# Patient Record
Sex: Female | Born: 1935 | Race: White | Hispanic: No | Smoking: Former smoker
Health system: Southern US, Community
[De-identification: ages and names within clinical notes are randomized; demographics above are authoritative.]

## PROBLEM LIST (undated history)

## (undated) DIAGNOSIS — F419 Anxiety disorder, unspecified: Secondary | ICD-10-CM

## (undated) DIAGNOSIS — I10 Essential (primary) hypertension: Secondary | ICD-10-CM

## (undated) DIAGNOSIS — K224 Dyskinesia of esophagus: Secondary | ICD-10-CM

## (undated) DIAGNOSIS — E785 Hyperlipidemia, unspecified: Secondary | ICD-10-CM

## (undated) DIAGNOSIS — J45909 Unspecified asthma, uncomplicated: Secondary | ICD-10-CM

## (undated) DIAGNOSIS — J449 Chronic obstructive pulmonary disease, unspecified: Secondary | ICD-10-CM

## (undated) DIAGNOSIS — N390 Urinary tract infection, site not specified: Secondary | ICD-10-CM

## (undated) DIAGNOSIS — T7840XA Allergy, unspecified, initial encounter: Secondary | ICD-10-CM

## (undated) DIAGNOSIS — M8580 Other specified disorders of bone density and structure, unspecified site: Secondary | ICD-10-CM

## (undated) DIAGNOSIS — M797 Fibromyalgia: Secondary | ICD-10-CM

## (undated) DIAGNOSIS — G4700 Insomnia, unspecified: Secondary | ICD-10-CM

## (undated) DIAGNOSIS — H269 Unspecified cataract: Secondary | ICD-10-CM

## (undated) DIAGNOSIS — M1990 Unspecified osteoarthritis, unspecified site: Secondary | ICD-10-CM

## (undated) DIAGNOSIS — K5792 Diverticulitis of intestine, part unspecified, without perforation or abscess without bleeding: Secondary | ICD-10-CM

## (undated) DIAGNOSIS — K449 Diaphragmatic hernia without obstruction or gangrene: Secondary | ICD-10-CM

## (undated) DIAGNOSIS — J439 Emphysema, unspecified: Secondary | ICD-10-CM

## (undated) HISTORY — PX: BLADDER SURGERY: SHX569

## (undated) HISTORY — PX: ABDOMINAL HYSTERECTOMY: SHX81

## (undated) HISTORY — DX: Other specified disorders of bone density and structure, unspecified site: M85.80

## (undated) HISTORY — DX: Diaphragmatic hernia without obstruction or gangrene: K44.9

## (undated) HISTORY — DX: Diverticulitis of intestine, part unspecified, without perforation or abscess without bleeding: K57.92

## (undated) HISTORY — DX: Dyskinesia of esophagus: K22.4

## (undated) HISTORY — PX: COLONOSCOPY: SHX174

## (undated) HISTORY — DX: Unspecified cataract: H26.9

## (undated) HISTORY — DX: Emphysema, unspecified: J43.9

## (undated) HISTORY — DX: Fibromyalgia: M79.7

## (undated) HISTORY — DX: Hyperlipidemia, unspecified: E78.5

## (undated) HISTORY — DX: Essential (primary) hypertension: I10

## (undated) HISTORY — DX: Anxiety disorder, unspecified: F41.9

## (undated) HISTORY — DX: Insomnia, unspecified: G47.00

## (undated) HISTORY — DX: Unspecified osteoarthritis, unspecified site: M19.90

## (undated) HISTORY — DX: Allergy, unspecified, initial encounter: T78.40XA

## (undated) HISTORY — DX: Urinary tract infection, site not specified: N39.0

---

## 1999-05-07 ENCOUNTER — Ambulatory Visit (HOSPITAL_BASED_OUTPATIENT_CLINIC_OR_DEPARTMENT_OTHER): Admission: RE | Admit: 1999-05-07 | Discharge: 1999-05-07 | Payer: Self-pay | Admitting: Plastic Surgery

## 1999-12-17 ENCOUNTER — Encounter: Payer: Self-pay | Admitting: Obstetrics and Gynecology

## 1999-12-17 ENCOUNTER — Encounter: Admission: RE | Admit: 1999-12-17 | Discharge: 1999-12-17 | Payer: Self-pay | Admitting: Obstetrics and Gynecology

## 2000-01-24 ENCOUNTER — Encounter: Payer: Self-pay | Admitting: Emergency Medicine

## 2000-01-24 ENCOUNTER — Emergency Department (HOSPITAL_COMMUNITY): Admission: EM | Admit: 2000-01-24 | Discharge: 2000-01-24 | Payer: Self-pay | Admitting: Emergency Medicine

## 2000-02-19 ENCOUNTER — Other Ambulatory Visit: Admission: RE | Admit: 2000-02-19 | Discharge: 2000-02-19 | Payer: Self-pay | Admitting: Obstetrics and Gynecology

## 2001-02-02 ENCOUNTER — Encounter: Payer: Self-pay | Admitting: Urology

## 2001-02-08 ENCOUNTER — Inpatient Hospital Stay (HOSPITAL_COMMUNITY): Admission: RE | Admit: 2001-02-08 | Discharge: 2001-02-10 | Payer: Self-pay | Admitting: Urology

## 2001-02-17 ENCOUNTER — Emergency Department (HOSPITAL_COMMUNITY): Admission: EM | Admit: 2001-02-17 | Discharge: 2001-02-17 | Payer: Self-pay | Admitting: *Deleted

## 2001-03-16 ENCOUNTER — Encounter: Payer: Self-pay | Admitting: Obstetrics and Gynecology

## 2001-03-16 ENCOUNTER — Encounter: Admission: RE | Admit: 2001-03-16 | Discharge: 2001-03-16 | Payer: Self-pay | Admitting: Obstetrics and Gynecology

## 2004-02-28 ENCOUNTER — Encounter: Admission: RE | Admit: 2004-02-28 | Discharge: 2004-02-28 | Payer: Self-pay | Admitting: Obstetrics and Gynecology

## 2004-06-11 ENCOUNTER — Encounter: Admission: RE | Admit: 2004-06-11 | Discharge: 2004-06-11 | Payer: Self-pay | Admitting: Gastroenterology

## 2005-11-04 ENCOUNTER — Encounter: Admission: RE | Admit: 2005-11-04 | Discharge: 2005-11-04 | Payer: Self-pay | Admitting: Obstetrics and Gynecology

## 2005-12-18 ENCOUNTER — Ambulatory Visit (HOSPITAL_COMMUNITY): Admission: RE | Admit: 2005-12-18 | Discharge: 2005-12-19 | Payer: Self-pay | Admitting: Ophthalmology

## 2005-12-18 ENCOUNTER — Encounter (INDEPENDENT_AMBULATORY_CARE_PROVIDER_SITE_OTHER): Payer: Self-pay | Admitting: *Deleted

## 2006-05-08 ENCOUNTER — Encounter: Payer: Self-pay | Admitting: Family Medicine

## 2006-11-10 ENCOUNTER — Encounter: Admission: RE | Admit: 2006-11-10 | Discharge: 2006-11-10 | Payer: Self-pay | Admitting: Obstetrics and Gynecology

## 2007-11-11 ENCOUNTER — Encounter: Admission: RE | Admit: 2007-11-11 | Discharge: 2007-11-11 | Payer: Self-pay | Admitting: Obstetrics and Gynecology

## 2008-12-06 ENCOUNTER — Ambulatory Visit: Payer: Self-pay | Admitting: Family Medicine

## 2008-12-06 DIAGNOSIS — I10 Essential (primary) hypertension: Secondary | ICD-10-CM | POA: Insufficient documentation

## 2008-12-06 DIAGNOSIS — E7800 Pure hypercholesterolemia, unspecified: Secondary | ICD-10-CM | POA: Insufficient documentation

## 2008-12-06 DIAGNOSIS — M899 Disorder of bone, unspecified: Secondary | ICD-10-CM | POA: Insufficient documentation

## 2008-12-06 DIAGNOSIS — M949 Disorder of cartilage, unspecified: Secondary | ICD-10-CM

## 2008-12-06 DIAGNOSIS — K5732 Diverticulitis of large intestine without perforation or abscess without bleeding: Secondary | ICD-10-CM | POA: Insufficient documentation

## 2008-12-06 DIAGNOSIS — H3530 Unspecified macular degeneration: Secondary | ICD-10-CM | POA: Insufficient documentation

## 2008-12-06 DIAGNOSIS — IMO0001 Reserved for inherently not codable concepts without codable children: Secondary | ICD-10-CM | POA: Insufficient documentation

## 2008-12-11 ENCOUNTER — Encounter: Payer: Self-pay | Admitting: Family Medicine

## 2008-12-12 LAB — CONVERTED CEMR LAB
ALT: 14 units/L (ref 0–35)
AST: 18 units/L (ref 0–37)
Albumin: 4.3 g/dL (ref 3.5–5.2)
Alkaline Phosphatase: 70 units/L (ref 39–117)
CO2: 24 meq/L (ref 19–32)
Chloride: 100 meq/L (ref 96–112)
Creatinine, Ser: 0.78 mg/dL (ref 0.40–1.20)
Glucose, Bld: 102 mg/dL — ABNORMAL HIGH (ref 70–99)
LDL Cholesterol: 146 mg/dL — ABNORMAL HIGH (ref 0–99)
Potassium: 4.9 meq/L (ref 3.5–5.3)
Sodium: 137 meq/L (ref 135–145)
Total CHOL/HDL Ratio: 4.3

## 2009-01-30 ENCOUNTER — Encounter: Payer: Self-pay | Admitting: Family Medicine

## 2009-01-31 LAB — CONVERTED CEMR LAB
Cholesterol: 225 mg/dL — ABNORMAL HIGH (ref 0–200)
HDL: 48 mg/dL (ref >39)
Total CHOL/HDL Ratio: 4.7

## 2009-02-05 ENCOUNTER — Telehealth: Payer: Self-pay | Admitting: Family Medicine

## 2009-02-07 ENCOUNTER — Ambulatory Visit: Payer: Self-pay | Admitting: Family Medicine

## 2009-02-08 LAB — CONVERTED CEMR LAB
Chloride: 100 meq/L (ref 96–112)
Estradiol: 41.3 pg/mL
FSH: 65.1 milliintl units/mL
HCT: 43.8 % (ref 36.0–46.0)
Hemoglobin: 14 g/dL (ref 12.0–15.0)
LH: 20.9 milliintl units/mL
MCHC: 32 g/dL (ref 30.0–36.0)
Platelets: 268 10*3/uL (ref 150–400)
Progesterone: <0.2 ng/mL
RBC: 4.66 M/uL (ref 3.87–5.11)
RDW: 13.6 % (ref 11.5–15.5)

## 2009-03-05 ENCOUNTER — Encounter: Admission: RE | Admit: 2009-03-05 | Discharge: 2009-03-05 | Payer: Self-pay | Admitting: Family Medicine

## 2009-03-14 DIAGNOSIS — K449 Diaphragmatic hernia without obstruction or gangrene: Secondary | ICD-10-CM | POA: Insufficient documentation

## 2009-05-04 ENCOUNTER — Ambulatory Visit: Payer: Self-pay | Admitting: Family Medicine

## 2009-05-11 ENCOUNTER — Ambulatory Visit: Payer: Self-pay | Admitting: Family Medicine

## 2009-05-11 DIAGNOSIS — R3915 Urgency of urination: Secondary | ICD-10-CM | POA: Insufficient documentation

## 2009-05-11 DIAGNOSIS — R011 Cardiac murmur, unspecified: Secondary | ICD-10-CM | POA: Insufficient documentation

## 2009-05-11 LAB — CONVERTED CEMR LAB
Bilirubin Urine: NEGATIVE
Blood in Urine, dipstick: NEGATIVE
Glucose, Urine, Semiquant: NEGATIVE
Ketones, urine, test strip: NEGATIVE
Specific Gravity, Urine: 1.015
Urobilinogen, UA: 0.2
WBC Urine, dipstick: NEGATIVE

## 2009-05-12 ENCOUNTER — Encounter: Payer: Self-pay | Admitting: Family Medicine

## 2009-05-17 ENCOUNTER — Encounter: Payer: Self-pay | Admitting: Family Medicine

## 2009-05-17 LAB — CONVERTED CEMR LAB
LDL Cholesterol: 110 mg/dL — ABNORMAL HIGH (ref 0–99)
Total CHOL/HDL Ratio: 3.6
Triglycerides: 86 mg/dL (ref <150)
VLDL: 17 mg/dL (ref 0–40)

## 2009-05-22 ENCOUNTER — Ambulatory Visit: Payer: Self-pay | Admitting: Cardiovascular Disease

## 2009-05-22 ENCOUNTER — Encounter: Payer: Self-pay | Admitting: Family Medicine

## 2009-05-22 ENCOUNTER — Ambulatory Visit: Payer: Self-pay

## 2009-05-22 ENCOUNTER — Ambulatory Visit (HOSPITAL_COMMUNITY): Admission: RE | Admit: 2009-05-22 | Discharge: 2009-05-22 | Payer: Self-pay | Admitting: Family Medicine

## 2009-06-12 ENCOUNTER — Telehealth: Payer: Self-pay | Admitting: Family Medicine

## 2009-06-22 ENCOUNTER — Ambulatory Visit: Payer: Self-pay | Admitting: Family Medicine

## 2009-10-16 LAB — HM DEXA SCAN

## 2009-11-12 ENCOUNTER — Ambulatory Visit: Payer: Self-pay | Admitting: Family

## 2009-11-13 ENCOUNTER — Telehealth: Payer: Self-pay | Admitting: Family

## 2010-03-28 ENCOUNTER — Ambulatory Visit: Payer: Self-pay | Admitting: Family Medicine

## 2010-03-28 LAB — CONVERTED CEMR LAB: Rapid Strep: NEGATIVE

## 2010-05-02 ENCOUNTER — Telehealth: Payer: Self-pay | Admitting: Family Medicine

## 2010-05-03 ENCOUNTER — Ambulatory Visit
Admission: RE | Admit: 2010-05-03 | Discharge: 2010-05-03 | Payer: Self-pay | Source: Home / Self Care | Attending: Family Medicine | Admitting: Family Medicine

## 2010-05-03 ENCOUNTER — Encounter
Admission: RE | Admit: 2010-05-03 | Discharge: 2010-05-03 | Payer: Self-pay | Source: Home / Self Care | Attending: Family Medicine | Admitting: Family Medicine

## 2010-05-03 DIAGNOSIS — J209 Acute bronchitis, unspecified: Secondary | ICD-10-CM | POA: Insufficient documentation

## 2010-05-28 NOTE — Assessment & Plan Note (Signed)
Summary: FLU SHOT  Nurse Visit   Vitals Entered By: Payton Spark CMA (May 04, 2009 2:06 PM)  Allergies: No Known Drug Allergies  Orders Added: 1)  Flu Vaccine 15yrs + [90658] 2)  Administration Flu vaccine - MCR [G0008] Flu Vaccine Consent Questions     Do you have a history of severe allergic reactions to this vaccine? no    Any prior history of allergic reactions to egg and/or gelatin? no    Do you have a sensitivity to the preservative Thimersol? no    Do you have a past history of Guillan-Barre Syndrome? no    Do you currently have an acute febrile illness? no    Have you ever had a severe reaction to latex? no    Vaccine information given and explained to patient? yes    Are you currently pregnant? no    Lot Number:[id number]   Exp Date:10/25/2009   Site Given  Left Deltoid IMmedflu

## 2010-05-28 NOTE — Assessment & Plan Note (Signed)
Summary: folliculitis   Vital Signs:  Patient profile:   77 year old female Height:      63.5 inches Weight:      161 pounds BMI:     28.17 O2 Sat:      96 % on Room air Temp:     98.4 degrees F oral Pulse rate:   104 / minute BP sitting:   123 / 80  (left arm) Cuff size:   regular  Vitals Entered By: Payton Spark CMA (June 22, 2009 2:37 PM)  O2 Flow:  Room air CC: tired, sore in nose, HA and changes in nails.    Primary Care Provider:  Nani Gasser MD  CC:  tired, sore in nose, and HA and changes in nails. .  History of Present Illness: 77 yo WF presents for feeling tired for 2 wks.  She has multiple vague complaints.  She has been under a lot of stress.  Her husband has been going thru medical problems and has not been so nice to her.  She has also been going thru a lot of things in the attic which has stirred up some emotions.  She is sleeping well at night.  She has not been exercising.  She has recently restarted all of her meds.  Denies fevers, wt loss, cough.    She has been using neosporin and peroxide on a sore in her nose for the past few wks it has not cleared.  It started after she pulled a hair out.    Allergies: No Known Drug Allergies  Past History:  Past Medical History: Reviewed history from 05/11/2009 and no changes required. HTN, high cholesterol Bronchietasis Hx of situational anxiety Insomnia - In the past tried doxepin and didn't work.   Social History: Reviewed history from 02/07/2009 and no changes required. Married to [employer].  Her husband has many chronic illnesses. HS degree. Has a daughter and son. Son is disabled.  Former Smoker, quit 1954 Alcohol use-no Drug use-no Regular exercise-no  Review of Systems      See HPI  Physical Exam  General:  alert, well-developed, well-nourished, and well-hydrated.   Head:  normocephalic and atraumatic.   Eyes:  conjunctiva clear Ears:  no external deformities.   Nose:  no  nasal discharge.  injected hair follicle in the R side Mouth:  o/p pink and moist Neck:  no masses.   Lungs:  Normal respiratory effort, chest expands symmetrically. Lungs are clear to auscultation, no crackles or wheezes. Heart:  Normal rate and regular rhythm. S1 and S2 normal without gallop, murmur, click, rub or other extra sounds. Extremities:  no LE edema Skin:  color normal.   brittle fingernails and toenails Cervical Nodes:  No lymphadenopathy noted   Impression & Recommendations:  Problem # 1:  FOLLICULITIS (ICD-704.8) Treat with topical bactroban ointment. Clean with soap daily  Problem # 2:  OTHER SPECIFIED DISEASE OF NAIL (ICD-703.8) Brittle nails may be the result of stress, poor nutrition, etc. I advised changing her MVI to PNV daily to help w/ nails. She needs to f/u with Dr Linford Arnold reguarding high stress level and mood.  Complete Medication List: 1)  Clorazepate Dipotassium 3.75 Mg Tabs (Clorazepate dipotassium) .... Take one tablet by mouth at bedtime 2)  Imipramine Hcl 50 Mg Tabs (Imipramine hcl) .... Take 1 tablet by mouth once a day at bedtime 3)  Prilosec 20 Mg Cpdr (Omeprazole) .... Take one tablet by mouth once in the morning 4)  Calcium Carbonate 1250 Mg Tabs (Calcium carbonate) .... Take one tablet by mouth once a day 5)  Multivitamins Tabs (Multiple vitamin) .... Take one tablet by mouth once a day 6)  Pravastatin Sodium 20 Mg Tabs (Pravastatin sodium) .... Take 1 tablet by mouth once a day at bedtime 7)  Vitamin D 1000 Unit Tabs (Cholecalciferol) .... Take one tabalet by mouth 9 times a day 8)  Preservision/lutein Caps (Multiple vitamins-minerals) .... Take one tablet by mouth twice a day 9)  Vitamin B-6 500 Mg Tabs (Pyridoxine hcl) .... Take one tablet by mouth once a day 10)  Vitamin B-12 1000 Mcg Tabs (Cyanocobalamin) .... Take one tablet by mouth once a day 11)  Proventil Hfa 108 (90 Base) Mcg/act Aers (Albuterol sulfate) .... 2 - 4  puff inhaled every  4-6 hours as needed 12)  Bactroban 2 % Oint (Mupirocin) .... Apply to nasal sore three times a day x 7 days  Patient Instructions: 1)  Use Bactroban ointment for nasal sore. 2)  Change Multivitamin to Prenatal Vitamin daily for nail growth. 3)  F/u with Dr Linford Arnold in 1-2 wks if mood is not improving.   Prescriptions: BACTROBAN 2 % OINT (MUPIROCIN) apply to nasal sore three times a day x 7 days  #1 tube x 0   Entered and Authorized by:   Seymour Bars DO   Signed by:   Seymour Bars DO on 06/22/2009   Method used:   Electronically to        [facility] [address] (retail)       [address] [place], [place]  [address]       Ph: [phone] or [phone]       Fax: [phone]   RxID:   [id number]

## 2010-05-28 NOTE — Assessment & Plan Note (Signed)
Summary: URI   Vital Signs:  Patient profile:   77 year old female Height:      63.5 inches Weight:      163 pounds BMI:     28.52 O2 Sat:      96 % on Room air Temp:     98.7 degrees F oral Pulse rate:   105 / minute BP sitting:   114 / 68  (left arm) Cuff size:   regular  Vitals Entered By: Payton Spark CMA (March 28, 2010 3:07 PM)  O2 Flow:  Room air CC: Very ST x 2 days.   Primary Care Provider:  Nani Gasser MD  CC:  Very ST x 2 days.Marland Kitchen  History of Present Illness: ST and cough for 2 days. No ear pain, fever, or ear pain.  Some loose stools.  TAking mucinex and IBUPROFEN and robitussin. Coughing so much she couldn't sleep. She is coughing in the rooma dn it sounds wet. She was around her grandaughter last weekend who had a runny nose. + fatigue.  No SOB.    Current Medications (verified): 1)  Clorazepate Dipotassium 3.75 Mg Tabs (Clorazepate Dipotassium) .... Take One Tablet By Mouth At Bedtime 2)  Imipramine Hcl 50 Mg Tabs (Imipramine Hcl) .... Take 1 Tablet By Mouth Once A Day At Bedtime 3)  Prilosec 20 Mg Cpdr (Omeprazole) .... Take One Tablet By Mouth Once in The Morning 4)  Calcium Carbonate 1250 Mg Tabs (Calcium Carbonate) .... Take One Tablet By Mouth Once A Day 5)  Multivitamins  Tabs (Multiple Vitamin) .... Take One Tablet By Mouth Once A Day 6)  Vitamin D 1000 Unit Tabs (Cholecalciferol) .... Take One Tabalet By Mouth 9 Times A Day 7)  Preservision/lutein  Caps (Multiple Vitamins-Minerals) .... Take One Tablet By Mouth Twice A Day 8)  Vitamin B-6 500 Mg Tabs (Pyridoxine Hcl) .... Take One Tablet By Mouth Once A Day 9)  Vitamin B-12 1000 Mcg Tabs (Cyanocobalamin) .... Take One Tablet By Mouth Once A Day  Allergies (verified): No Known Drug Allergies  Physical Exam  General:  Well-developed,well-nourished,in no acute distress; alert,appropriate and cooperative throughout examination Head:  Normocephalic and atraumatic without obvious abnormalities.  No apparent alopecia or balding. Eyes:  No corneal or conjunctival inflammation noted. EOMI. Perrla. Ears:  External ear exam shows no significant lesions or deformities.  Otoscopic examination reveals clear canals, tympanic membranes are intact bilaterally without bulging, retraction, inflammation or discharge. Hearing is grossly normal bilaterally. Nose:  External nasal examination shows no deformity or inflammation. Nasal mucosa are pink and moist without lesions or exudates. Mouth:  OP with mild erythema. NO lesions.  Neck:  No deformities, masses, or tenderness noted. Lungs:  Normal respiratory effort, chest expands symmetrically. Lungs are clear to auscultation, no crackles or wheezes. Heart:  Normal rate and regular rhythm. S1 and S2 normal without gallop, murmur, click, rub or other extra sounds. Skin:  no rashes.   Cervical Nodes:  No lymphadenopathy noted Psych:  Cognition and judgment appear intact. Alert and cooperative with normal attention span and concentration. No apparent delusions, illusions, hallucinations   Impression & Recommendations:  Problem # 1:  URI (ICD-465.9)  Cough med for symptomatic tx. Call if getting worse or not better in 1-2 weeks.  Her updated medication list for this problem includes:    Hydrocodone-homatropine 5-1.5 Mg/26ml Syrp (Hydrocodone-homatropine) .Marland KitchenMarland KitchenMarland KitchenMarland Kitchen 5ml by mouth at bedtime  Instructed on symptomatic treatment. Call if symptoms persist or worsen.   Orders: Rapid Strep (  [address])  Complete Medication List: 1)  Clorazepate Dipotassium 3.75 Mg Tabs (Clorazepate dipotassium) .... Take one tablet by mouth at bedtime 2)  Imipramine Hcl 50 Mg Tabs (Imipramine hcl) .... Take 1 tablet by mouth once a day at bedtime 3)  Prilosec 20 Mg Cpdr (Omeprazole) .... Take one tablet by mouth once in the morning 4)  Calcium Carbonate 1250 Mg Tabs (Calcium carbonate) .... Take one tablet by mouth once a day 5)  Multivitamins Tabs (Multiple vitamin) .... Take one  tablet by mouth once a day 6)  Vitamin D 1000 Unit Tabs (Cholecalciferol) .... Take one tabalet by mouth 9 times a day 7)  Preservision/lutein Caps (Multiple vitamins-minerals) .... Take one tablet by mouth twice a day 8)  Vitamin B-6 500 Mg Tabs (Pyridoxine hcl) .... Take one tablet by mouth once a day 9)  Vitamin B-12 1000 Mcg Tabs (Cyanocobalamin) .... Take one tablet by mouth once a day 10)  Hydrocodone-homatropine 5-1.5 Mg/62ml Syrp (Hydrocodone-homatropine) .... 5ml by mouth at bedtime  Patient Instructions: 1)  Salt water gargles for your throat.  2)  Can try the cough medicine for night.   3)  Call if not Better in 1-2 weeks.  Prescriptions: HYDROCODONE-HOMATROPINE 5-1.5 MG/5ML SYRP (HYDROCODONE-HOMATROPINE) 5ml by mouth at bedtime  #145ml x 0   Entered and Authorized by:   Nani Gasser MD   Signed by:   Nani Gasser MD on 03/28/2010   Method used:   Printed then faxed to ...       [facility] [address] (retail)       [address]       [place], [place]  [address]       Ph: [phone]       Fax: [phone]   RxID:   [id number]    Orders Added: 1)  Rapid Strep [44010] 2)  Est. Patient Level III [[address]253]    Laboratory Results    Other Tests  Rapid Strep: negative

## 2010-05-28 NOTE — Progress Notes (Signed)
Summary: refill  Phone Note Refill Request Message from:  Patient on June 12, 2009 1:11 PM  Refills Requested: Medication #1:  CLORAZEPATE DIPOTASSIUM 3.75 MG TABS Take one tablet by mouth at bedtime   Supply Requested: 1 month fax to [facility] [address] in [place]   Method Requested: Fax to Local Pharmacy Initial call taken by: Kathlene November,  June 12, 2009 1:12 PM    Prescriptions: CLORAZEPATE DIPOTASSIUM 3.75 MG TABS (CLORAZEPATE DIPOTASSIUM) Take one tablet by mouth at bedtime  #30 x 2   Entered and Authorized by:   Seymour Bars DO   Signed by:   Seymour Bars DO on 06/12/2009   Method used:   Printed then faxed to ...       [facility] [address] (retail)       [address] [place], [place]  [address]       Ph: [phone] or [phone]       Fax: [phone]   RxID:   [id number]

## 2010-05-28 NOTE — Assessment & Plan Note (Signed)
Summary: Bug Bite that has a blister and red-jr--Rm 6   Vital Signs:  Patient profile:   77 year old female Height:      63.5 inches Weight:      162.25 pounds BMI:     28.39 Temp:     97.9 degrees F oral Pulse rate:   90 / minute Pulse rhythm:   regular Resp:     16 per minute BP sitting:   130 / 86  (right arm) Cuff size:   regular  Vitals Entered By: Beth [name] CMA Duncan Dull) (November 12, 2009 3:50 PM) CC: Room 6  Pt has red area on left upper arm that started out like a mosquito bite and developed 2 blisters since last Thursday. Is Patient Diabetic? No Comments Pt has completed Bactroban. Stopped Proventil as insurance no longer covers it.   Primary Care Provider:  Nani Gasser MD  CC:  Room 6  Pt has red area on left upper arm that started out like a mosquito bite and developed 2 blisters since last Thursday.Marland Kitchen  History of Present Illness: Beth [name] is a 77 year old female who presents today with complaint of bug bite since Thursday.  Initally blistered/itchy.   Was hot over the weekend. Became hard.  Denies fever.  Denies known tick exposure. She is currently taking cipro for a UTI.    Allergies (verified): No Known Drug Allergies  Physical Exam  General:  Well-developed,well-nourished,in no acute distress; alert,appropriate and cooperative throughout examination Skin:  6 cm diameter area or erythema, no significant swelling Psych:  Cognition and judgment appear intact. Alert and cooperative with normal attention span and concentration. No apparent delusions, illusions, hallucinations   Impression & Recommendations:  Problem # 1:  INSECT BITE, UPPER ARM (ICD-912.4) Assessment New Suspect that redness is due to allergic reaction.  Will plan follow up in a few weeks to evaluate and apply hydrocortisone for now.    Complete Medication List: 1)  Clorazepate Dipotassium 3.75 Mg Tabs (Clorazepate dipotassium) .... Take one tablet by mouth at bedtime 2)  Imipramine  Hcl 50 Mg Tabs (Imipramine hcl) .... Take 1 tablet by mouth once a day at bedtime 3)  Prilosec 20 Mg Cpdr (Omeprazole) .... Take one tablet by mouth once in the morning 4)  Calcium Carbonate 1250 Mg Tabs (Calcium carbonate) .... Take one tablet by mouth once a day 5)  Multivitamins Tabs (Multiple vitamin) .... Take one tablet by mouth once a day 6)  Pravastatin Sodium 20 Mg Tabs (Pravastatin sodium) .... Take 1 tablet by mouth once a day at bedtime 7)  Vitamin D 1000 Unit Tabs (Cholecalciferol) .... Take one tabalet by mouth 9 times a day 8)  Preservision/lutein Caps (Multiple vitamins-minerals) .... Take one tablet by mouth twice a day 9)  Vitamin B-6 500 Mg Tabs (Pyridoxine hcl) .... Take one tablet by mouth once a day 10)  Vitamin B-12 1000 Mcg Tabs (Cyanocobalamin) .... Take one tablet by mouth once a day 11)  Proventil Hfa 108 (90 Base) Mcg/act Aers (Albuterol sulfate) .... 2 - 4  puff inhaled every 4-6 hours as needed 12)  Bactroban 2 % Oint (Mupirocin) .... Apply to nasal sore three times a day x 7 days 13)  Ciprofloxacin Hcl 500 Mg Tabs (Ciprofloxacin hcl) .... Take 1 tablet by mouth two times a day  Patient Instructions: 1)  Call if you develop fever over 101, increasing pain, swelling, firmness, or redness of your left arm.     Current  Allergies (reviewed today): No known allergies

## 2010-05-28 NOTE — Assessment & Plan Note (Signed)
Summary: 3 MO FU HTN, chol, urinary freq, etc   Vital Signs:  Patient profile:   77 year old Beth [name] Height:      63.5 inches Weight:      162 pounds Pulse rate:   87 / minute BP sitting:   131 / 83  (left arm) Cuff size:   regular  Vitals Entered By: Kathlene November (May 11, 2009 10:48 AM) CC: checkup BP   Primary Care Provider:  Nani Gasser MD  CC:  checkup BP.  History of Present Illness: Some urinargy frequncy adn odor. Her urine looks "cloudy".  No pain or abdominal pain.  Hx of bladder sling.    Feels her hair has lost its volume. Also noticed some skin adn nails changed. Noticed nails were thin. Had normal TSH in October. Has gained 5 lbs since October.  Admist her diet has changed over the HOlidays.   Right artery "beats hard" at night.  No pain but would like me to look at this.  Not sure when started. No exacerbating or alleviating sxs.    Current Medications (verified): 1)  Clorazepate Dipotassium 3.75 Mg Tabs (Clorazepate Dipotassium) .... Take One Tablet By Mouth At Bedtime 2)  Imipramine Hcl 50 Mg Tabs (Imipramine Hcl) .... Take 1 Tablet By Mouth Once A Day At Bedtime 3)  Prilosec 20 Mg Cpdr (Omeprazole) .... Take One Tablet By Mouth Once in The Morning 4)  Calcium Carbonate 1250 Mg Tabs (Calcium Carbonate) .... Take One Tablet By Mouth Once A Day 5)  Multivitamins  Tabs (Multiple Vitamin) .... Take One Tablet By Mouth Once A Day 6)  Pravastatin Sodium 20 Mg Tabs (Pravastatin Sodium) .... Take 1 Tablet By Mouth Once A Day At Bedtime 7)  Vitamin D 1000 Unit Tabs (Cholecalciferol) .... Take One Tabalet By Mouth 9 Times A Day 8)  Preservision/lutein  Caps (Multiple Vitamins-Minerals) .... Take One Tablet By Mouth Twice A Day 9)  Vitamin B-6 500 Mg Tabs (Pyridoxine Hcl) .... Take One Tablet By Mouth Once A Day 10)  Vitamin B-12 1000 Mcg Tabs (Cyanocobalamin) .... Take One Tablet By Mouth Once A Day 11)  Proventil Hfa 108 (90 Base) Mcg/act Aers (Albuterol Sulfate)  .... 2 - 4  Puff Inhaled Every 4-6 Hours As Needed  Allergies (verified): No Known Drug Allergies  Comments:  Nurse/Medical Assistant: The patient's medications and allergies were reviewed with the patient and were updated in the Medication and Allergy Lists. Kathlene November (May 11, 2009 10:49 AM)  Past History:  Past Medical History: HTN, high cholesterol Bronchietasis Hx of situational anxiety Insomnia - In the past tried doxepin and didn't work.   Past Surgical History: Badder surgery, tac Hysterectomy   Physical Exam  General:  Well-developed,well-nourished,in no acute distress; alert,appropriate and cooperative throughout examination Head:  Normocephalic and atraumatic without obvious abnormalities. No apparent alopecia or balding. Neck:  No deformities, masses, or tenderness noted. No TM.  Lungs:  Normal respiratory effort, chest expands symmetrically. Lungs are clear to auscultation, no crackles or wheezes. Heart:  Normal rate and regular rhythm. S1 and S2 normal without gallop, murmur, , rub or other extra sounds.  Has a diastolic click that radiates towards the neck. Best heard at the right sternal border. No carotid or abdomianl bruits.  Abdomen:  non-tender, normal bowel sounds, and no distention.   Pulses:  Radial 2+ Skin:  no rashes.   Cervical Nodes:  No lymphadenopathy noted Psych:  Cognition and judgment appear intact. Alert and cooperative with  normal attention span and concentration. No apparent delusions, illusions, hallucinations   Impression & Recommendations:  Problem # 1:  HYPERTENSION, BENIGN (ICD-401.1) Looks geat today.  At goal thought a little higher than usual for her.    Problem # 2:  DRY SKIN (ICD-701.1) Will recheck TSH.  Though normal 3 mo ago.   Orders: T-TSH ([id number])  Problem # 3:  URINARY URGENCY (ICD-788.63) UA was clear so will send a culture.  Orders: UA Dipstick w/o Micro (automated)  (81003) T-Urine Culture (Spectrum  Order) ([id number])  Problem # 4:  MURMUR (ICD-785.2) Will refer for echo for further evaluation of the aortic valve.  No carotid murmur on exam.  Orders: T-2-D Echo Complete ([id number])  Problem # 5:  HYPERCHOLESTEROLEMIA (ICD-272.0) Due to recheck on teh statin. Will go ahed and refill today but the reason I only have her 2 mo worth was so we could recheck her values adn make sure this is the right med for her.  Her updated medication list for this problem includes:    Pravastatin Sodium 20 Mg Tabs (Pravastatin sodium) .Marland Kitchen... Take 1 tablet by mouth once a day at bedtime  Orders: T-Lipid Profile ([id number])  Complete Medication List: 1)  Clorazepate Dipotassium 3.75 Mg Tabs (Clorazepate dipotassium) .... Take one tablet by mouth at bedtime 2)  Imipramine Hcl 50 Mg Tabs (Imipramine hcl) .... Take 1 tablet by mouth once a day at bedtime 3)  Prilosec 20 Mg Cpdr (Omeprazole) .... Take one tablet by mouth once in the morning 4)  Calcium Carbonate 1250 Mg Tabs (Calcium carbonate) .... Take one tablet by mouth once a day 5)  Multivitamins Tabs (Multiple vitamin) .... Take one tablet by mouth once a day 6)  Pravastatin Sodium 20 Mg Tabs (Pravastatin sodium) .... Take 1 tablet by mouth once a day at bedtime 7)  Vitamin D 1000 Unit Tabs (Cholecalciferol) .... Take one tabalet by mouth 9 times a day 8)  Preservision/lutein Caps (Multiple vitamins-minerals) .... Take one tablet by mouth twice a day 9)  Vitamin B-6 500 Mg Tabs (Pyridoxine hcl) .... Take one tablet by mouth once a day 10)  Vitamin B-12 1000 Mcg Tabs (Cyanocobalamin) .... Take one tablet by mouth once a day 11)  Proventil Hfa 108 (90 Base) Mcg/act Aers (Albuterol sulfate) .... 2 - 4  puff inhaled every 4-6 hours as needed Prescriptions: PRAVASTATIN SODIUM 20 MG TABS (PRAVASTATIN SODIUM) Take 1 tablet by mouth once a day at bedtime  #30 x 6   Entered and Authorized by:   Nani Gasser MD   Signed by:   Nani Gasser MD on  05/11/2009   Method used:   Electronically to        [facility] [address] (retail)       [address] [place], [place]  [address]       Ph: [phone] or [phone]       Fax: [phone]   RxID:   [id number] IMIPRAMINE HCL 50 MG TABS (IMIPRAMINE HCL) Take 1 tablet by mouth once a day at bedtime  #30 x 6   Entered and Authorized by:   Nani Gasser MD   Signed by:   Nani Gasser MD on 05/11/2009   Method used:   Electronically to        [facility] [address] (retail)       [address]  [place], [place]  [address]       Ph: [phone] or [phone]       Fax: [phone]   RxID:   [id number]    Laboratory Results   Urine Tests  Date/Time Received: 05/11/2009 Date/Time Reported: 05/11/2009  Routine Urinalysis   Color: yellow Appearance: Clear Glucose: negative   (Normal Range: Negative) Bilirubin: negative   (Normal Range: Negative) Ketone: negative   (Normal Range: Negative) Spec. Gravity: 1.015   (Normal Range: 1.003-1.035) Blood: negative   (Normal Range: Negative) pH: 8.5   (Normal Range: 5.0-8.0) Protein: negative   (Normal Range: Negative) Urobilinogen: 0.2   (Normal Range: 0-1) Nitrite: negative   (Normal Range: Negative) Leukocyte Esterace: negative   (Normal Range: Negative)

## 2010-05-28 NOTE — Progress Notes (Signed)
  Phone Note Outgoing Call   Summary of Call: Please call patient and arrange a follow up visit in a few weeks so that we can re-evaluate her rash.  In the meantime, she can use hydrocortisone cream to see if this helps the redness.  Initial call taken by: Lemont Fillers FNP,  November 13, 2009 5:12 PM  Follow-up for Phone Call        Called pt left VM of instructions. KJ LPN Follow-up by: Kathlene November,  November 14, 2009 8:09 AM     Appended Document:  Pt called back and states rash is gone and is better- no need for return visit to re-evaluate. KJ LPN

## 2010-05-30 NOTE — Assessment & Plan Note (Signed)
Summary: acute bronchitis   Vital Signs:  Patient profile:   77 year old female Height:      63.5 inches Weight:      163 pounds O2 Sat:      88 % on Room air Temp:     98.4 degrees F oral Pulse rate:   86 / minute BP sitting:   147 / 90  (right arm) Cuff size:   regular  Vitals Entered By: Avon Gully CMA, (AAMA) (May 03, 2010 10:40 AM)  O2 Flow:  Room air CC: ongoing cough,chest hurts,sides hurt,HA,dizziness   Primary Care Provider:  Nani Gasser MD  CC:  ongoing cough, chest hurts, sides hurt, HA, and dizziness.  History of Present Illness: Seen 5 weeks ago for URI.  Says got some better but didn't get completely rid of the cough. It eventually because a loose cough and she thought this was good.  Then visited with her sister over Christmas how had a head cold and then suddenly cough was worse ad eyes were watering.  Has been taking cough meds.  Now having runny nose.  Did try zyrtec but hasn' really helped.  No fever.  Lots of post nasal drip. No facial pressure. No ear pressure.  No GI sxs. she denies any shortness of breath .  She has had some soreness at the bases of her lungs she thinks secondary to coughing.  She is predominantly been using NyQuil at that time for the cough.  Current Medications (verified): 1)  Clorazepate Dipotassium 3.75 Mg Tabs (Clorazepate Dipotassium) .... Take One Tablet By Mouth At Bedtime 2)  Imipramine Hcl 50 Mg Tabs (Imipramine Hcl) .... Take 1 Tablet By Mouth Once A Day At Bedtime 3)  Prilosec 20 Mg Cpdr (Omeprazole) .... Take One Tablet By Mouth Once in The Morning 4)  Calcium Carbonate 1250 Mg Tabs (Calcium Carbonate) .... Take One Tablet By Mouth Once A Day 5)  Multivitamins  Tabs (Multiple Vitamin) .... Take One Tablet By Mouth Once A Day 6)  Vitamin D 1000 Unit Tabs (Cholecalciferol) .... Take One Tabalet By Mouth 9 Times A Day 7)  Preservision/lutein  Caps (Multiple Vitamins-Minerals) .... Take One Tablet By Mouth Twice A  Day 8)  Vitamin B-6 500 Mg Tabs (Pyridoxine Hcl) .... Take One Tablet By Mouth Once A Day 9)  Vitamin B-12 1000 Mcg Tabs (Cyanocobalamin) .... Take One Tablet By Mouth Once A Day 10)  Hydrocodone-Homatropine 5-1.5 Mg/2ml Syrp (Hydrocodone-Homatropine) .... 5ml By Mouth At Bedtime  Allergies (verified): No Known Drug Allergies  Comments:  Nurse/Medical Assistant: The patient's medications and allergies were reviewed with the patient and were updated in the Medication and Allergy Lists. Avon Gully CMA, Duncan Dull) (May 03, 2010 10:42 AM)  Physical Exam  General:  Well-developed,well-nourished,in no acute distress; alert,appropriate and cooperative throughout examination. her cough sounds very deep and wet. Head:  Normocephalic and atraumatic without obvious abnormalities. No apparent alopecia or balding. Eyes:  No corneal or conjunctival inflammation noted. EOMI. Jannett Celestine or papilledema. Vision grossly normal. Ears:  External ear exam shows no significant lesions or deformities.  Otoscopic examination reveals clear canals, tympanic membranes are intact bilaterally without bulging, retraction, inflammation or discharge. Hearing is grossly normal bilaterally. Nose:  External nasal examination shows no deformity or inflammation.  Mouth:  Oral mucosa and oropharynx without lesions or exudates.   Neck:  No deformities, masses, or tenderness noted. Lungs:  she has coarse breath sounds at the upper lungs bilaterally.  She  also has some rhonchi at the right base.  No  wheezing Heart:  Normal rate and regular rhythm. S1 and S2 normal without gallop, murmur, click, rub or other extra sounds. Skin:  no rashes.   Cervical Nodes:  No lymphadenopathy noted Psych:  Cognition and judgment appear intact. Alert and cooperative with normal attention span and concentration. No apparent delusions, illusions, hallucinations   Impression & Recommendations:  Problem # 1:  ACUTE BRONCHITIS  (ICD-466.0) unlike the chest x-ray to rule out pneumonia since she has had this cough for over 4 weeks at this point.  She also has an abnormal lung exam today.she is afebrile and not short of breath which is a good sign.  I did refill her cough medication.  And depending on her chest x-ray shows atelectatic her on antibiotic. Her updated medication list for this problem includes:    Hydrocodone-homatropine 5-1.5 Mg/9ml Syrp (Hydrocodone-homatropine) .Marland KitchenMarland KitchenMarland KitchenMarland Kitchen 5ml by mouth at bedtime  Orders: T-Chest x-ray, 2 views (71020)  Complete Medication List: 1)  Clorazepate Dipotassium 3.75 Mg Tabs (Clorazepate dipotassium) .... Take one tablet by mouth at bedtime 2)  Imipramine Hcl 50 Mg Tabs (Imipramine hcl) .... Take 1 tablet by mouth once a day at bedtime 3)  Prilosec 20 Mg Cpdr (Omeprazole) .... Take one tablet by mouth once in the morning 4)  Calcium Carbonate 1250 Mg Tabs (Calcium carbonate) .... Take one tablet by mouth once a day 5)  Multivitamins Tabs (Multiple vitamin) .... Take one tablet by mouth once a day 6)  Vitamin D 1000 Unit Tabs (Cholecalciferol) .... Take one tabalet by mouth 9 times a day 7)  Preservision/lutein Caps (Multiple vitamins-minerals) .... Take one tablet by mouth twice a day 8)  Vitamin B-6 500 Mg Tabs (Pyridoxine hcl) .... Take one tablet by mouth once a day 9)  Vitamin B-12 1000 Mcg Tabs (Cyanocobalamin) .... Take one tablet by mouth once a day 10)  Hydrocodone-homatropine 5-1.5 Mg/40ml Syrp (Hydrocodone-homatropine) .... 5ml by mouth at bedtime  Patient Instructions: 1)  WE will call you with the xray results.  Prescriptions: HYDROCODONE-HOMATROPINE 5-1.5 MG/5ML SYRP (HYDROCODONE-HOMATROPINE) 5ml by mouth at bedtime  #127ml x 0   Entered and Authorized by:   Nani Gasser MD   Signed by:   Nani Gasser MD on 05/03/2010   Method used:   Printed then faxed to ...       [id number] (retail)       [address]       [place], [place]  [address]       Ph:  [phone]       Fax: [phone]   RxID:   [id number]    Orders Added: 1)  T-Chest x-ray, 2 views [71020] 2)  Est. Patient Level IV [84132]

## 2010-05-30 NOTE — Progress Notes (Signed)
Summary: cough  Phone Note Call from Patient   Caller: Patient Call For: Nani Gasser MD Summary of Call: Pt called and states her cough is no better and now she feels worse. Also has a runny nose.Please advise. [facility] in [place]  Initial call taken by: Avon Gully CMA, Duncan Dull),  May 02, 2010 2:20 PM  Follow-up for Phone Call        Needs appt as likely needs CXR.   Follow-up by: Nani Gasser MD,  May 02, 2010 3:03 PM  Additional Follow-up for Phone Call Additional follow up Details #1::        pt notifed Additional Follow-up by: Avon Gully CMA, Duncan Dull),  May 02, 2010 4:21 PM

## 2010-06-28 ENCOUNTER — Telehealth: Payer: Self-pay | Admitting: Family Medicine

## 2010-07-04 NOTE — Progress Notes (Signed)
Summary: Rx.Refill  Phone Note Refill Request Message from:  Patient on June 28, 2010 1:09 PM  Refills Requested: Medication #1:  CLORAZEPATE DIPOTASSIUM 3.75 MG TABS Take one tablet by mouth at bedtime   Dosage confirmed as above?Dosage Confirmed Patient called and states that she has left messages on the nurses line and has been out of this med. for a couple of days now and needs it called in as soon as possible. She states she needs this med called in TODAY. Thanks.Michaelle Copas  June 28, 2010 1:11 PM   Initial call taken by: Michaelle Copas,  June 28, 2010 1:09 PM    Prescriptions: CLORAZEPATE DIPOTASSIUM 3.75 MG TABS (CLORAZEPATE DIPOTASSIUM) Take one tablet by mouth at bedtime  #30 x 1   Entered and Authorized by:   Nani Gasser MD   Signed by:   Nani Gasser MD on 06/28/2010   Method used:   Printed then faxed to ...       [id number] (retail)       [address]       [place], [place]  [address]       Ph: [phone]       Fax: [phone]   RxID:   [id number]

## 2010-07-24 ENCOUNTER — Other Ambulatory Visit: Payer: Self-pay | Admitting: Family Medicine

## 2010-08-23 ENCOUNTER — Other Ambulatory Visit: Payer: Self-pay | Admitting: Family Medicine

## 2010-08-29 ENCOUNTER — Other Ambulatory Visit: Payer: Self-pay | Admitting: Family Medicine

## 2010-08-29 MED ORDER — CLORAZEPATE DIPOTASSIUM 3.75 MG PO TABS: 3.7500 mg | ORAL_TABLET | Freq: Every day | ORAL | Status: DC

## 2010-09-02 ENCOUNTER — Telehealth: Payer: Self-pay | Admitting: Family Medicine

## 2010-09-02 NOTE — Telephone Encounter (Signed)
Dr. Linford Arnold I took care of getting the medication sent to the correct pharm.   Jarvis Newcomer, LPN Domingo Dimes

## 2010-09-02 NOTE — Telephone Encounter (Signed)
Patient called and a little upset and states she is having trouble getting her RX. For Clorazepate refilled. She needs this called into [facility] in [place]. PLEASE call her back today and let her know what is going on with her Rx. At [phone]. Thanks, [employer]

## 2010-09-02 NOTE — Telephone Encounter (Signed)
Needs refills .  Pharm has faxed X 2.  She needs her med so she can sleep.  Needs Tranxene 3.75mg  PO QHS.  # 30/3refills sent to [facility] on  08-29-10.  It should had went to [facility] in [place].  Called and left message for the correct pharmacy to fill if they don't have script.  If they do have script to told to please call pt and let know either way. Plan:  Routed to self. Jarvis Newcomer, LPN Dom[facility] Dimes

## 2010-09-02 NOTE — Telephone Encounter (Signed)
This was sent in 5/3. Darl Pikes can you please call this in for her based on the rx from 5/3.

## 2010-09-03 ENCOUNTER — Other Ambulatory Visit: Payer: Self-pay | Admitting: Family Medicine

## 2010-09-03 NOTE — Telephone Encounter (Signed)
Pt called and said pharm did not have her tranxene sent to them. Plan:  Called pt.  Had to [facility] for her and she was instructed that her Rx was faxed this am.  Told to call pharm and see if they received the fax and if not to call triage nurse back. Jarvis Newcomer, LPN [facility] Dimes

## 2010-09-07 ENCOUNTER — Encounter: Payer: Self-pay | Admitting: Family Medicine

## 2010-09-09 ENCOUNTER — Ambulatory Visit (INDEPENDENT_AMBULATORY_CARE_PROVIDER_SITE_OTHER): Payer: Medicare PPO | Admitting: Family Medicine

## 2010-09-09 VITALS — BP 121/84 | HR 65 | Ht 63.5 in | Wt 154.0 lb

## 2010-09-09 DIAGNOSIS — F430 Acute stress reaction: Secondary | ICD-10-CM | POA: Insufficient documentation

## 2010-09-09 DIAGNOSIS — E785 Hyperlipidemia, unspecified: Secondary | ICD-10-CM

## 2010-09-09 DIAGNOSIS — E7800 Pure hypercholesterolemia, unspecified: Secondary | ICD-10-CM

## 2010-09-09 DIAGNOSIS — I10 Essential (primary) hypertension: Secondary | ICD-10-CM

## 2010-09-09 MED ORDER — CLORAZEPATE DIPOTASSIUM 3.75 MG PO TABS: 3.7500 mg | ORAL_TABLET | Freq: Every day | ORAL | Status: AC

## 2010-09-09 MED ORDER — FLUOXETINE HCL (PMDD) 20 MG PO TABS: ORAL_TABLET | ORAL | Status: DC

## 2010-09-09 NOTE — Assessment & Plan Note (Signed)
Discussed options. She would like counseling a medication to help her. Will refer to [place] or Darel Hong downstairs as she prefers a female.  Also she is interested in a medication as well.  Will start prozac. Discussed potential SE of the medication. F/U in 3 weeks. I did give her a paper rx for her tranzene as well since she has had such difficulty getting her medications

## 2010-09-09 NOTE — Progress Notes (Signed)
  Subjective:    Patient ID: Beth [name], female    DOB: [birth year], 77 y.o.   MRN: [id number]  HPI Says she is really struggling emotionally with her husband. Says she is just getting angry and wants to see a counselor. She is just very frustrated. Says her husband is difficulty to live with. She is also having difficulty getting her meds. We had faxed over refill for her trexane 2 weeks ago but the pharmacy says they never received it.     Review of Systems     Objective:   Physical Exam  Constitutional: She appears well-developed and well-nourished.  HENT:  Head: Normocephalic and atraumatic.  Cardiovascular: Normal rate, regular rhythm and normal heart sounds.   Pulmonary/Chest: Effort normal and breath sounds normal.          Assessment & Plan:

## 2010-09-09 NOTE — Assessment & Plan Note (Signed)
Says she didn't tolerate the chol med well. Says she felt "aweful" on it so stopped it. Says she wants to recheck her chol and see if better.

## 2010-09-09 NOTE — Assessment & Plan Note (Signed)
Bp at goal. Looks great today.

## 2010-09-10 ENCOUNTER — Telehealth: Payer: Self-pay | Admitting: Family Medicine

## 2010-09-10 LAB — COMPLETE METABOLIC PANEL WITH GFR
BUN: 12 mg/dL (ref 6–23)
CO2: 25 mEq/L (ref 19–32)
Chloride: 102 mEq/L (ref 96–112)
GFR, Est Non African American: >60 mL/min (ref >60)
Glucose, Bld: 85 mg/dL (ref 70–99)
Potassium: 4 mEq/L (ref 3.5–5.3)
Sodium: 140 mEq/L (ref 135–145)
Total Bilirubin: 0.6 mg/dL (ref 0.3–1.2)
Total Protein: 6.6 g/dL (ref 6.0–8.3)

## 2010-09-10 LAB — TSH: TSH: 1.031 u[IU]/mL (ref 0.350–4.500)

## 2010-09-10 LAB — LIPID PANEL
Cholesterol: 207 mg/dL — ABNORMAL HIGH (ref 0–200)
LDL Cholesterol: 139 mg/dL — ABNORMAL HIGH (ref 0–99)
VLDL: 24 mg/dL (ref 0–40)

## 2010-09-10 NOTE — Telephone Encounter (Signed)
Call patient: Complete metabolic panel is normal. Her LDL is elevated. It is higher than last year. I really want her to work on with her diet and exercise and recheck in 6 months to see if we can get it down to where was a year ago. Her thyroid level is normal.

## 2010-09-10 NOTE — Telephone Encounter (Signed)
Pt.notified

## 2010-09-13 NOTE — Discharge Summary (Signed)
[facility]  Patient:    Beth [name], Beth [name] Visit Number: [id number] MRN: [id number]          Service Type: EMS Location: ED Attending Physician:  Carmelina Peal Dictated by:   Bertram Millard. Dahlstedt, M.D. Admit Date:  02/17/2001 Discharge Date: 02/17/2001                             Discharge Summary  PRIMARY DIAGNOSIS:  Stress urinary incontinence.  SECONDARY DIAGNOSES: 1. Gastroesophageal reflux disease. 2. Bronchiectasis. 3. Fibromyalgia. 4. Rectocele, mild.  PROCEDURE:  Pubovaginal sling February 08, 2001.  BRIEF HISTORY:  This is a 77 year old female with typical stress urinary incontinence. She has been evaluated in the office. Treatment options has been discussed with the patient for several months. Conservative measures have failed, and at this point, the patient desires definitive surgical management. She is aware of the risks and complications and has desire to proceed with surgery.  HOSPITAL COURSE:  The patient was admitted to my service and underwent pubovaginal sling in placement of ____ suprapubic tube. She tolerated the procedure well. On postoperative day #1, she had a mild temperature to 100.8. She remained asymptomatic and actually tolerated regular diet starting the evening of her surgery. She began voiding late the first postoperative day, although not to completion. She was discharged to home on postoperative day #2 in improved condition, afebrile, and with her wound showing adequate healing. She will follow up with me in 2 days over the phone to discuss voiding success.  DISCHARGE MEDICATIONS: 1. Keflex 250 mg 1 p.o. q.12h. for 1 week. 2. Vicodin 1-2 p.o. q.4h. p.r.n. pain #25 in addition to regular home    medications.  ACTIVITY:  Activity restrictions were discussed with the patient predischarge. ictated by:   Bertram Millard. Dahlstedt, M.D. Attending Physician:  Carmelina Peal DD:  02/08/01 TD:  02/10/01 Job:  [id number]

## 2010-09-13 NOTE — Op Note (Signed)
NAME:  BOOTHSabina, Beth [name]                 ACCOUNT NO.:  [id number]   MEDICAL RECORD NO.:  [id number]          PATIENT TYPE:  AMB   LOCATION:  SDS                          FACILITY:  [facility]   PHYSICIAN:  John D. Ashley Royalty, M.D. DATE OF BIRTH:  [birth year]   DATE OF PROCEDURE:  12/18/2005  DATE OF DISCHARGE:                                 OPERATIVE REPORT   ADMISSION DIAGNOSES:  1. Dislocated intraocular lens.  2. Vitreous prolapse into the anterior chamber.   PROCEDURES:  1. A 25-gauge pars plana vitrectomy, right eye.  2. Capsulectomy, right eye.  3. Removal of intraocular lens from the vitreous, right eye.  4. Placement of secondary intraocular lens with suture, right eye.  5. Retinal photocoagulation, right eye.   SURGEON:  Dr. Alan Mulder.   ASSISTANT:  Rosalie Doctor, MA.   ANESTHESIA:  General.   DETAILS:  Usual prep and drape.  Peritomy from 8 to 4 o'clock.  Half-  thickness scleral flaps raised at 3 and 9 o'clock in anticipation of IOL  suture.  Corneoscleral wound created in three layer fashion, from 10 o'clock  to 2 o'clock. The  25 gauge trocars placed at 8, 10 and 2 o'clock.  The 5-mm  infusion port at 8 o'clock.  The lighted pick and the cutter placed at 10 at  2 o'clock respectively.  The pars plana vitrectomy was begun just behind the  pseudophakos.  The pseudophakos was partially ensnared in the vitreous  cavity.  The vitreous was carefully teased from around the intraocular lens  and from around the 12 o'clock area.  A posterior vitrectomy was carried out  with Provisc on the corneal surface and the biome viewing system.  The  corneoscleral wound was opened and the intraocular lens was passed from  vitreous to anterior chamber and out through the corneoscleral wound.  Two  10-0 Prolene sutures were passed from 3 o'clock to 9 o'clock, behind the  iris and anterior to the capsular remnants.  The suture was externalized  through the corneoscleral wound.  The new  intraocular lens was brought onto  the field, made by [employer] model CZ70BD, power 20.5D,  length 12.5 mm, optic 7.0 mm, serial number [id number], expiration date May  2011.  The implant was inspected and cleaned.  The sutures were attached to  the eyelets.  The intraocular lens was passed into the anterior chamber and  into the posterior chamber.  It was dialed into place, in the ciliary  sulcus.  The sutures were knotted externally beneath the scleral flaps.  The  wound was tested for vitreous with the Sinskey hook and the vitreous cutter.  No vitreous was seen and the pupil was round.  The cornea was closed with  eight interrupted 10-0 sutures.  A posterior vitrectomy was then again  continued out into the far periphery, where vitreous was removed for 306  degrees, down to the vitreous base.  The indirect ophthalmoscope laser was  moved into place, 948 burns placed around the retinal periphery with a power  of 460 milliwatts, 1000 microns each,  and 0.1 seconds each.  A 20% gas fill  was then performed.  The trocars were removed from the eye and the wounds  were held until tight.  Conjunctiva was reapproximated with 7-0 chromic  suture.  The pars plana vitrectomy included removal of capsular remnants.  Polymyxin and gentamicin were irrigated into tenons space.  Marcaine was  injected around the globe for postop pain.  Decadron 10 mg was injected to  the lower subconjunctival space.  TobraDex ophthalmic ointment, patch and  shield were placed.  Closing pressure was 10 with a Risk manager.   COMPLICATIONS:  None.   DURATION:  One and a half hours.      Beulah Gandy. Ashley Royalty, M.D.  Electronically Signed     JDM/MEDQ  D:  12/18/2005  T:  12/18/2005  Job:  [id number]

## 2010-09-13 NOTE — Op Note (Signed)
[facility]  Patient:    Beth [name], Beth [name] Visit Number: [id number] MRN: [id number]          Service Type: SUR Location: 3W 0356 02 Attending Physician:  Liborio Nixon Proc. Date: 02/08/01 Admit Date:  02/08/2001                             Operative Report  PREOPERATIVE DIAGNOSIS:  Stress urinary incontinence.  POSTOPERATIVE DIAGNOSIS:  Stress urinary incontinence.  PRINCIPAL PROCEDURE:  Pubovaginal sling with cadaveric fascia lata.  SURGEON:  Bertram Millard. Dahlstedt, M.D.  ANESTHESIA:  General endotracheal.  COMPLICATIONS:  None.  BRIEF HISTORY:  A 77 year old woman with typical stress urinary incontinence. She is status post hysterectomy and anterior repair in the distant past.  She has significant symptoms of leakage and has been followed for several months. She has been on vaginal estrogens.  She has been counseled on treatment options and has chosen to have definitive surgical management.  She is aware of the risks and complications and desires to proceed.  DESCRIPTION OF PROCEDURE:  The patient was administered a general endotracheal anesthetic and preoperative IV antibiotics.  She was placed in the dorsal lithotomy position.  Genitalia and perineum were prepped and draped.  Anterior vaginal wall was infiltrated with 1% lidocaine with epinephrine.  Bladder was catheterized, and 10 cc of saline put in the balloon.  An incision was made in the midline anterior vagina from the bladder neck to just proximal to the urethral meatus.  Vaginal mucosa was elevated, dissected bilaterally. Transverse incision was made in the suprapubic area approximately 4 cm in length and carried down to rectus fascia.  The endopelvic fascia was pierced bilaterally, and a finger was used to dissect the space of Retzius out.  A 2 x 7 cm piece of prepared cadaveric fascia lata was then put in antibiotic solution, and Helical sutures were used to suture  each end.  The Raz needle was passed through the retropubic space, grasping the ends of the sutures through the vaginal incision and pulled superiorly.  In this way, the fascia came right underneath the bladder neck and proximal urethra.  It was sutured in place with 4-0 Vicryl at the bladder neck and mid urethra.  Gentle tension/traction was used to pull the ends of the fascia through the space of Retzius, and the sutures were loosely tied over the rectus fascia.  In this manner, there was excellent support of the bladder neck and proximal urethra. With the bladder full and crede, some water did escape.  Cystoscopic inspection was made of the bladder which appeared normal.  Vaginal mucosa was closed using running 2-0 chromic.   Suprapubic tube was passed through a puncture wound above the abdominal incision.  Abdominal incision was closed using skin staples.  The Bonnano tube was sutured to the skin using 3-0 nylon.  Vaginal pack was placed.  Dry sterile dressings were placed.  The patient tolerated the procedure well.  Estimated blood loss approximately 200 cc.  She was awakened, extubated, and taken to the PACU in stable condition. Attending Physician:  Liborio Nixon DD:  02/08/01 TD:  02/08/01 Job: [phone]

## 2010-09-13 NOTE — Op Note (Signed)
[facility]  Patient:    Beth [name], Beth [name]                       MRN: [id number] Proc. Date: 05/07/99 Attending:  Alfredia Ferguson, M.D. CC:         Hope M. Danella Deis, M.D.                           Operative Report  PREOPERATIVE DIAGNOSIS:  Open wound, right nasal alar junction secondary to Mohs excision of basal cell carcinoma performed on May 06, 1999.  POSTOPERATIVE DIAGNOSIS:  Open wound, right nasal alar junction secondary to Mohs excision of basal cell carcinoma performed on May 06, 1999.  OPERATION PERFORMED:  Closure of 1 cm open wound, right nasal alar junction.  SURGEON:  Alfredia Ferguson, M.D.  ANESTHESIA:  2% Xylocaine 1:100,000 epinephrine.  INDICATIONS FOR PROCEDURE:  The patient is a 77 year old woman who underwent Mohs excision of a basal cell carcinoma at the point where the nasal ala meets the cheek.  She has been left with an approximately 1 cm open wound.  ____________ reports clear margins.  She comes in today for closure of this wound.  The patient understands she will end up with a permanent potentially unsightly scar at this  site.  In spite of that she wishes to proceed with the surgery.  DESCRIPTION OF PROCEDURE:  After adequate local anesthesia had been infiltrated, the patients right nasal area and upper lip area were prepped and draped in a sterile fashion.  The circular defect was converted to an ellipse by excising the entire Mohs defect and excising an inferior and superior corner above and below the circular defect.  Hemostasis was accomplished using pressure.  The wound edges ere undermined for a distance of 2 to 3 mm in all directions.  The wound was closed by first approximating the deeper tissue using interrupted 5-0 Vicryl suture.  The  dermis was closed in a similar fashion.  The skin was then united using multiple interrupted 5-0 nylon sutures.  A light dressing was applied.  The  patient tolerated the procedure well.  Minimal blood loss. DD:  05/07/99 TD:  05/07/99 Job: [id number]

## 2010-09-16 ENCOUNTER — Other Ambulatory Visit: Payer: Self-pay | Admitting: Family Medicine

## 2010-09-16 DIAGNOSIS — F430 Acute stress reaction: Secondary | ICD-10-CM

## 2010-10-01 ENCOUNTER — Encounter: Payer: Self-pay | Admitting: Family Medicine

## 2010-10-01 ENCOUNTER — Ambulatory Visit (INDEPENDENT_AMBULATORY_CARE_PROVIDER_SITE_OTHER): Payer: Medicare PPO | Admitting: Family Medicine

## 2010-10-01 DIAGNOSIS — E7800 Pure hypercholesterolemia, unspecified: Secondary | ICD-10-CM

## 2010-10-01 DIAGNOSIS — I10 Essential (primary) hypertension: Secondary | ICD-10-CM

## 2010-10-01 DIAGNOSIS — F430 Acute stress reaction: Secondary | ICD-10-CM

## 2010-10-01 MED ORDER — SIMVASTATIN 10 MG PO TABS: 10.0000 mg | ORAL_TABLET | Freq: Every day | ORAL | Status: DC

## 2010-10-01 MED ORDER — FLUOXETINE HCL 40 MG PO CAPS: 40.0000 mg | ORAL_CAPSULE | Freq: Every day | ORAL | Status: DC

## 2010-10-01 NOTE — Progress Notes (Signed)
  Subjective:    Patient ID: Beth [name], female    DOB: 12/22/1932, 77 y.o.   MRN: [id number]  HPI  Overall she is feeling much better. Says she has only had one "episode" since she saw me 3 weeks ago. Has appt next week with Darel Hong for counseling. That is her first appt.  She is happy with the medication but she is actually taking 2 tabs. She misread the instructions.    She is OK with starring a chol medication.  She was on pravastatin before and says it made her feel fatigued. She is OK with trying something else.   Review of Systems     Objective:   Physical Exam  Constitutional: She appears well-developed and well-nourished.  Cardiovascular: Normal rate, regular rhythm and normal heart sounds.   Pulmonary/Chest: Effort normal and breath sounds normal.  Psychiatric: She has a normal mood and affect. Her behavior is normal. Judgment and thought content normal.          Assessment & Plan:

## 2010-10-01 NOTE — Assessment & Plan Note (Addendum)
She overall is doing much better. She is actually taking 40mg  of prozac. She misread the instruction. No SE and happy with he rcurrent regimen. She has her first appt with counseling next week with Merlene Morse. F/U in 10 weeks. She will have started counseling by then.  PHQ-9 score of  3 Today.

## 2010-10-01 NOTE — Assessment & Plan Note (Signed)
BP was at goal last visit but high today. Will recheck at followup.

## 2010-10-01 NOTE — Assessment & Plan Note (Signed)
Will start simvastatin and recheck labs in 2-3 months to see if at goal. Continue to work on diet and exercise.

## 2010-10-08 ENCOUNTER — Ambulatory Visit (INDEPENDENT_AMBULATORY_CARE_PROVIDER_SITE_OTHER): Payer: Medicare PPO | Admitting: Licensed Clinical Social Worker

## 2010-10-08 DIAGNOSIS — F4322 Adjustment disorder with anxiety: Secondary | ICD-10-CM

## 2010-10-27 ENCOUNTER — Other Ambulatory Visit: Payer: Self-pay | Admitting: Family Medicine

## 2010-10-28 ENCOUNTER — Other Ambulatory Visit: Payer: Self-pay | Admitting: Family Medicine

## 2010-10-28 MED ORDER — IMIPRAMINE HCL 50 MG PO TABS: 50.0000 mg | ORAL_TABLET | Freq: Every day | ORAL | Status: DC

## 2010-10-31 ENCOUNTER — Encounter (INDEPENDENT_AMBULATORY_CARE_PROVIDER_SITE_OTHER): Payer: Medicare PPO | Admitting: Licensed Clinical Social Worker

## 2010-10-31 DIAGNOSIS — F4323 Adjustment disorder with mixed anxiety and depressed mood: Secondary | ICD-10-CM

## 2010-11-13 ENCOUNTER — Inpatient Hospital Stay (INDEPENDENT_AMBULATORY_CARE_PROVIDER_SITE_OTHER)
Admission: RE | Admit: 2010-11-13 | Discharge: 2010-11-13 | Disposition: A | Discharge status: Home / Self Care | Payer: Medicare PPO | Source: Ambulatory Visit | Attending: Emergency Medicine | Admitting: Emergency Medicine

## 2010-11-13 ENCOUNTER — Encounter: Payer: Self-pay | Admitting: Emergency Medicine

## 2010-11-13 DIAGNOSIS — E785 Hyperlipidemia, unspecified: Secondary | ICD-10-CM | POA: Insufficient documentation

## 2010-11-13 DIAGNOSIS — J209 Acute bronchitis, unspecified: Secondary | ICD-10-CM

## 2010-12-03 ENCOUNTER — Encounter: Payer: Self-pay | Admitting: Family Medicine

## 2010-12-03 ENCOUNTER — Ambulatory Visit (INDEPENDENT_AMBULATORY_CARE_PROVIDER_SITE_OTHER): Payer: Medicare PPO | Admitting: Family Medicine

## 2010-12-03 VITALS — BP 141/92 | HR 85 | Ht 65.5 in | Wt 150.0 lb

## 2010-12-03 DIAGNOSIS — H539 Unspecified visual disturbance: Secondary | ICD-10-CM

## 2010-12-03 DIAGNOSIS — I10 Essential (primary) hypertension: Secondary | ICD-10-CM

## 2010-12-03 DIAGNOSIS — E785 Hyperlipidemia, unspecified: Secondary | ICD-10-CM

## 2010-12-03 DIAGNOSIS — E7800 Pure hypercholesterolemia, unspecified: Secondary | ICD-10-CM

## 2010-12-03 DIAGNOSIS — H9190 Unspecified hearing loss, unspecified ear: Secondary | ICD-10-CM

## 2010-12-03 MED ORDER — LISINOPRIL 10 MG PO TABS: 10.0000 mg | ORAL_TABLET | Freq: Every day | ORAL | Status: DC

## 2010-12-03 NOTE — Progress Notes (Signed)
  Subjective:    Patient ID: Beth [name], female    DOB: 19-Dec-1932, 77 y.o.   MRN: [id number]  Hypertension This is a chronic problem. The current episode started more than 1 year ago. The problem is unchanged. The problem is uncontrolled. Pertinent negatives include no chest pain, peripheral edema or shortness of breath. There are no associated agents to hypertension. Past treatments include nothing.   Hyperlipidemia-she did start a statin at bedtime and is doing well on this. She's not had any myalgias or other side effects. She's been on it for a month at this point.  Feels her hearing and vision is getting worse.  This has been gradual. Feels her memory is getting worse over the last 3 years.  This was before starting the statin. Has an eye appt later this month.    Review of Systems  Respiratory: Negative for shortness of breath.   Cardiovascular: Negative for chest pain.       Objective:   Physical Exam  Constitutional: She is oriented to person, place, and time. She appears well-developed and well-nourished.  HENT:  Head: Normocephalic and atraumatic.  Right Ear: External ear normal.  Left Ear: External ear normal.  Nose: Nose normal.  Mouth/Throat: Oropharynx is clear and moist.       TMs and canals are clear.   Eyes: Conjunctivae are normal. Pupils are equal, round, and reactive to light.  Neck: Neck supple. No thyromegaly present.  Cardiovascular: Normal rate, regular rhythm and normal heart sounds.   Pulmonary/Chest: Effort normal and breath sounds normal. She has no wheezes.  Lymphadenopathy:    She has no cervical adenopathy.  Neurological: She is alert and oriented to person, place, and time.  Skin: Skin is warm and dry.  Psychiatric: She has a normal mood and affect.          Assessment & Plan:  Hearing loss -no evidence of cerumen impaction. I will refer her for audiometry.  Vision change-she he has a history of macular degeneration. Her next eye exam is  next month. I encouraged her to keep this.   Memory loss-we'll discuss options including checking some lab work to rule out other causes of memory loss. It is been gradual over the last 3 years. She does have a family history of dementia in her mother. As far as the possibility of vascular dementia we need to do our best to control her blood pressure and her cholesterol she recently started Crestor medication and today we're going to start blood pressure medication. Patient says she wants to hold off on the additional bloodwork for memory loss and says that if she starts to notice it's changing or getting worse and she would like further workup.

## 2010-12-03 NOTE — Assessment & Plan Note (Signed)
She has been on her statin for one month and tolerating it well. I gave her his lab slip today to go to in about 3-4 weeks to recheck her cholesterol.

## 2010-12-03 NOTE — Assessment & Plan Note (Addendum)
BP still elevated today so will start lisinopril. F/u in 1 month. Discussed potential SE. Rec DASH diet.  Weight loss is fantatic.

## 2010-12-03 NOTE — Assessment & Plan Note (Signed)
Blood pressure is elevated today again. We discussed starting an ACE inhibitor. We also discussed the DASH diet to help lower her salt intake. We discussed potential side effects of medication. Followup in one month.

## 2010-12-09 ENCOUNTER — Encounter: Payer: Self-pay | Admitting: Family Medicine

## 2010-12-09 ENCOUNTER — Ambulatory Visit (INDEPENDENT_AMBULATORY_CARE_PROVIDER_SITE_OTHER): Payer: Medicare PPO | Admitting: Family Medicine

## 2010-12-09 ENCOUNTER — Telehealth: Payer: Self-pay | Admitting: *Deleted

## 2010-12-09 ENCOUNTER — Telehealth: Payer: Self-pay | Admitting: Family Medicine

## 2010-12-09 ENCOUNTER — Ambulatory Visit
Admission: RE | Admit: 2010-12-09 | Discharge: 2010-12-09 | Disposition: A | Discharge status: Home / Self Care | Payer: Medicare PPO | Source: Ambulatory Visit | Attending: Family Medicine | Admitting: Family Medicine

## 2010-12-09 DIAGNOSIS — M25539 Pain in unspecified wrist: Secondary | ICD-10-CM

## 2010-12-09 DIAGNOSIS — M79643 Pain in unspecified hand: Secondary | ICD-10-CM

## 2010-12-09 DIAGNOSIS — S5290XA Unspecified fracture of unspecified forearm, initial encounter for closed fracture: Secondary | ICD-10-CM

## 2010-12-09 DIAGNOSIS — M79609 Pain in unspecified limb: Secondary | ICD-10-CM

## 2010-12-09 NOTE — Telephone Encounter (Signed)
Pt notified to come into office. KJ LPN

## 2010-12-09 NOTE — Telephone Encounter (Signed)
Call pt: Possible fracture in the radial stylus. Stop her lisinopril too.

## 2010-12-09 NOTE — Telephone Encounter (Signed)
Pt calls and states she fell last night out in the yard on her hand and it is bruised and really sore. Said she has tried calling several hand specialists but can not get an answer. Can she get an order for xray or do you need to see her-you are booked this afternoon.

## 2010-12-09 NOTE — Telephone Encounter (Signed)
Tell her to come on in and I will see her today.

## 2010-12-09 NOTE — Progress Notes (Signed)
  Subjective:    Patient ID: Beth [name], female    DOB: 1932/10/01, 77 y.o.   MRN: [id number]  HPI Larey Seat in her garden last night. Was trimming some vines. Lost her balance and fell forward. Says this has been happening frequently.  Now right hand and wrist is bruised swollen, painful. Has been icing it. Also has a bruise around her right eye. Says tried to get in with ortho today but says they never returned her phone call. Says most of her pain is over her wrist adn 5th finger.    Review of Systems     Objective:   Physical Exam  HENT:       Bruise over her right Eye. EOMI  Musculoskeletal:       Right hand and wrist with significant bruising. She is very tender over the palmer side of wrist on the radial side.  Wrist with NROM. + sig swelling. Finger with NROM>           Assessment & Plan:  Wrist pain - will get xray to rule out fracture. Keep icing and use Tylenol prn for pain.  Will call with results.   I aksed her to f/u later for her falls. WIll have her hold lisinopril since BP is low today.

## 2010-12-10 NOTE — Telephone Encounter (Signed)
Discussed results with her husband. Will refer to ortho

## 2010-12-26 ENCOUNTER — Other Ambulatory Visit: Payer: Self-pay | Admitting: Family Medicine

## 2010-12-27 ENCOUNTER — Encounter: Payer: Self-pay | Admitting: Family Medicine

## 2011-01-01 ENCOUNTER — Encounter (INDEPENDENT_AMBULATORY_CARE_PROVIDER_SITE_OTHER): Payer: Medicare PPO | Admitting: Licensed Clinical Social Worker

## 2011-01-01 DIAGNOSIS — F4323 Adjustment disorder with mixed anxiety and depressed mood: Secondary | ICD-10-CM

## 2011-01-02 ENCOUNTER — Telehealth: Payer: Self-pay | Admitting: Family Medicine

## 2011-01-02 LAB — LIPID PANEL
Cholesterol: 192 mg/dL (ref 0–200)
LDL Cholesterol: 114 mg/dL — ABNORMAL HIGH (ref 0–99)

## 2011-01-02 LAB — COMPLETE METABOLIC PANEL WITH GFR
Albumin: 4.2 g/dL (ref 3.5–5.2)
Alkaline Phosphatase: 72 U/L (ref 39–117)
BUN: 10 mg/dL (ref 6–23)
CO2: 30 mEq/L (ref 19–32)
Chloride: 96 mEq/L (ref 96–112)
GFR, Est African American: >60 mL/min (ref >60)
GFR, Est Non African American: >60 mL/min (ref >60)
Total Bilirubin: 0.6 mg/dL (ref 0.3–1.2)
Total Protein: 6.9 g/dL (ref 6.0–8.3)

## 2011-01-02 LAB — TSH: TSH: 3.911 u[IU]/mL (ref 0.350–4.500)

## 2011-01-02 NOTE — Telephone Encounter (Signed)
LMOM for Pt to CB 

## 2011-01-02 NOTE — Telephone Encounter (Signed)
Call pt; cholesterol looks much better. Sodium a little low. Lets recheck  In 1-2 weeks.  Thyroid is nl.

## 2011-01-07 ENCOUNTER — Ambulatory Visit (INDEPENDENT_AMBULATORY_CARE_PROVIDER_SITE_OTHER): Payer: Medicare PPO | Admitting: Family Medicine

## 2011-01-07 ENCOUNTER — Ambulatory Visit
Admission: RE | Admit: 2011-01-07 | Discharge: 2011-01-07 | Disposition: A | Discharge status: Home / Self Care | Payer: Medicare PPO | Source: Ambulatory Visit | Attending: Family Medicine | Admitting: Family Medicine

## 2011-01-07 ENCOUNTER — Telehealth: Payer: Self-pay | Admitting: Family Medicine

## 2011-01-07 ENCOUNTER — Encounter: Payer: Self-pay | Admitting: Family Medicine

## 2011-01-07 VITALS — BP 142/93 | HR 88 | Wt 152.0 lb

## 2011-01-07 DIAGNOSIS — E871 Hypo-osmolality and hyponatremia: Secondary | ICD-10-CM

## 2011-01-07 DIAGNOSIS — R6889 Other general symptoms and signs: Secondary | ICD-10-CM

## 2011-01-07 DIAGNOSIS — R42 Dizziness and giddiness: Secondary | ICD-10-CM

## 2011-01-07 DIAGNOSIS — M25579 Pain in unspecified ankle and joints of unspecified foot: Secondary | ICD-10-CM

## 2011-01-07 NOTE — Progress Notes (Signed)
Subjective:    Patient ID: Beth [name], female    DOB: 09-03-1932, 77 y.o.   MRN: [id number]  HPI  Feels likes things move when she turns to the right.  Started about 4 weeks ago after turned after trimming vines in her yard and fractured her right arm.  Larey Seat again recently when turned after walking her dog.  Hut her left outer ankle as she feel between her steps.  Has been able to walk on it without problems though feels she is compensating some for her discomfort. Since June has noticed she is more forgetful.  Says will get in the car and forget where she is going. She denies any extremity weakness. No chest pain or palpitations. No ear pain pressure or fever or cold symptoms. It doesn't happen every time she turns her head to the right just occasionally. No prior history of peripheral vascular disease. She does have a history of a murmur.  Review of Systems     Objective:   Physical Exam  Constitutional: She is oriented to person, place, and time. She appears well-developed and well-nourished.  HENT:  Head: Normocephalic and atraumatic.  Right Ear: External ear normal.  Left Ear: External ear normal.  Nose: Nose normal.  Mouth/Throat: Oropharynx is clear and moist.       TMs and canals are clear.   Eyes: Conjunctivae and EOM are normal.       She has had surgery on her right thigh and therefore has an irregular pupil. Her left pupil constricts to light and accommodation. Her extraocular movements are intact.  Neck: Neck supple. No thyromegaly present.  Cardiovascular: Normal rate, regular rhythm and normal heart sounds.        No carotid bruits  Pulmonary/Chest: Effort normal and breath sounds normal. She has no wheezes.  Musculoskeletal:       Left ankle with NROM.  Contusion above the left lat malleolus.  Tendr over the post edge of the lt lat malleolus.   Lymphadenopathy:    She has no cervical adenopathy.  Neurological: She is alert and oriented to person, place, and time.  She displays normal reflexes. No cranial nerve deficit. She exhibits normal muscle tone. Coordination normal.  Skin: Skin is warm and dry.  Psychiatric: She has a normal mood and affect.          Assessment & Plan:  Dizziness-this seems to occur when she turns very quickly in Mr. head she thinks to the right. At this point, like to schedule her for carotid Dopplers to document any bruits on exam today. Also consider MRA. Otherwise neurologically she is intact. Also consider cardiac causes they would be unusual to occur with turning in one direction. I do not think that her blood pressure is a contributing factor. Her blood pressure was borderline low at the last visit and I held her lisinopril. Now today her blood pressure is too high off of the medications so I recommend that she restart with half of a tab. Consider MRI of her brain for further evaluation of her carotid Dopplers are normal. Followup in one month.  Forgetfulness-we could certainly schedule her for a Mini-Mental exam to evaluate this further. Though I suspect she can still swallow pretty close to normal she is still highly functioning. We can consider further workup with labs such as B12 etc. I am more concerned about her dizziness and the 2 falls that she has already had. She recently had a CMP that  showed some hyponatremia. Recheck today.  Left ankle pain-most likely a contusion but because she is compensating for how she is walking and she is tender over the posterior lateral malleolus and I do recommend x-ray today to rule out fracture. She did fracture her right arm with her last fall about 3 weeks ago.

## 2011-01-07 NOTE — Telephone Encounter (Signed)
Call pt: No fracture but does have arthritis.

## 2011-01-07 NOTE — Patient Instructions (Signed)
We will call you with the xray results and to schedule you for the ultrasound of your carotids.

## 2011-01-08 NOTE — Telephone Encounter (Signed)
Left message with spouse

## 2011-01-09 ENCOUNTER — Other Ambulatory Visit: Payer: Self-pay | Admitting: Family Medicine

## 2011-01-10 ENCOUNTER — Other Ambulatory Visit: Payer: Self-pay | Admitting: Family Medicine

## 2011-01-10 NOTE — Telephone Encounter (Signed)
Pt was suppose to had a 2 month followup.  Please have pt call the office to schedule.   Will only fill for 30 day supply to pt satisfies an appt.

## 2011-01-14 ENCOUNTER — Other Ambulatory Visit: Payer: Self-pay | Admitting: Family Medicine

## 2011-01-15 ENCOUNTER — Telehealth: Payer: Self-pay | Admitting: *Deleted

## 2011-01-15 ENCOUNTER — Ambulatory Visit
Admission: RE | Admit: 2011-01-15 | Discharge: 2011-01-15 | Disposition: A | Discharge status: Home / Self Care | Payer: Medicare PPO | Source: Ambulatory Visit | Attending: Family Medicine | Admitting: Family Medicine

## 2011-01-15 DIAGNOSIS — R42 Dizziness and giddiness: Secondary | ICD-10-CM

## 2011-01-15 NOTE — Telephone Encounter (Signed)
Pt.notified

## 2011-01-15 NOTE — Telephone Encounter (Signed)
Yes, lets move forward with MRI of the head. Order placed.

## 2011-01-15 NOTE — Telephone Encounter (Signed)
Message copied by MCCRIMMON, ANDREA C on Wed Jan 15, 2011  3:06 PM ------      Message from: METHENEY, CATHERINE D      Created: Wed Jan 15, 2011  2:41 PM       Mild right carotid bifurcation plaque resulting in less than      50% diameter stenosis. Thus no other intervention needed at this time. Just make sure taking cholesterol med and keeping BP under control. 

## 2011-01-15 NOTE — Telephone Encounter (Signed)
Pt notified and will make an appt to f/u in one month per last office note.. Does she need to be set up for MRI since dopplers were nml per last ov note?

## 2011-01-15 NOTE — Telephone Encounter (Signed)
Message copied by Wyline Beady on Wed Jan 15, 2011  3:06 PM ------      Message from: Nani Gasser D      Created: Wed Jan 15, 2011  2:41 PM       Mild right carotid bifurcation plaque resulting in less than      50% diameter stenosis. Thus no other intervention needed at this time. Just make sure taking cholesterol med and keeping BP under control.

## 2011-01-21 ENCOUNTER — Telehealth: Payer: Self-pay | Admitting: *Deleted

## 2011-01-21 NOTE — Telephone Encounter (Signed)
Message copied by Wyline Beady on Tue Jan 21, 2011  1:50 PM ------      Message from: Nani Gasser D      Created: Wed Jan 15, 2011  2:41 PM       Mild right carotid bifurcation plaque resulting in less than      50% diameter stenosis. Thus no other intervention needed at this time. Just make sure taking cholesterol med and keeping BP under control.

## 2011-01-21 NOTE — Telephone Encounter (Signed)
Left message for pt to call back  °

## 2011-01-22 ENCOUNTER — Telehealth: Payer: Self-pay | Admitting: *Deleted

## 2011-01-22 ENCOUNTER — Telehealth: Payer: Self-pay | Admitting: Family Medicine

## 2011-01-22 NOTE — Telephone Encounter (Signed)
Pt notified of results and MD instructions. KJ LPN

## 2011-01-22 NOTE — Telephone Encounter (Signed)
Message copied by Lanae Crumbly on Wed Jan 22, 2011  9:08 AM ------      Message from: Nani Gasser D      Created: Wed Jan 15, 2011  2:41 PM       Mild right carotid bifurcation plaque resulting in less than      50% diameter stenosis. Thus no other intervention needed at this time. Just make sure taking cholesterol med and keeping BP under control.

## 2011-01-22 NOTE — Telephone Encounter (Signed)
Pt called saying she is returning call. Plan:  Pt suppose to have MRI with and without contrast.  Gave the # to radiology so pt could call them to see if they were trying to call her to schedule. Jarvis Newcomer, LPN [facility]

## 2011-01-29 ENCOUNTER — Other Ambulatory Visit: Payer: Self-pay | Admitting: Family Medicine

## 2011-02-04 ENCOUNTER — Ambulatory Visit: Payer: Medicare PPO | Admitting: Family Medicine

## 2011-02-06 ENCOUNTER — Inpatient Hospital Stay (INDEPENDENT_AMBULATORY_CARE_PROVIDER_SITE_OTHER)
Admission: RE | Admit: 2011-02-06 | Discharge: 2011-02-06 | Disposition: A | Discharge status: Home / Self Care | Payer: Medicare PPO | Source: Ambulatory Visit | Attending: Emergency Medicine | Admitting: Emergency Medicine

## 2011-02-06 ENCOUNTER — Encounter: Payer: Self-pay | Admitting: Emergency Medicine

## 2011-02-06 DIAGNOSIS — J209 Acute bronchitis, unspecified: Secondary | ICD-10-CM

## 2011-02-12 ENCOUNTER — Encounter (HOSPITAL_COMMUNITY): Payer: Medicare PPO | Admitting: Behavioral Health

## 2011-02-16 ENCOUNTER — Other Ambulatory Visit: Payer: Self-pay | Admitting: Family Medicine

## 2011-02-18 ENCOUNTER — Encounter: Payer: Self-pay | Admitting: Family Medicine

## 2011-02-18 ENCOUNTER — Ambulatory Visit (INDEPENDENT_AMBULATORY_CARE_PROVIDER_SITE_OTHER): Payer: Medicare PPO | Admitting: Family Medicine

## 2011-02-18 VITALS — BP 122/76 | HR 102 | Wt 156.0 lb

## 2011-02-18 DIAGNOSIS — I10 Essential (primary) hypertension: Secondary | ICD-10-CM

## 2011-02-18 DIAGNOSIS — E785 Hyperlipidemia, unspecified: Secondary | ICD-10-CM

## 2011-02-18 DIAGNOSIS — Z23 Encounter for immunization: Secondary | ICD-10-CM

## 2011-02-18 DIAGNOSIS — R42 Dizziness and giddiness: Secondary | ICD-10-CM

## 2011-02-18 MED ORDER — LISINOPRIL 5 MG PO TABS: 5.0000 mg | ORAL_TABLET | Freq: Every day | ORAL | Status: DC

## 2011-02-18 MED ORDER — AMBULATORY NON FORMULARY MEDICATION: Status: DC

## 2011-02-18 NOTE — Progress Notes (Signed)
  Subjective:    Patient ID: Beth [name], female    DOB: 26-Nov-1932, 77 y.o.   MRN: [id number]  HPI F/U dizziness - She says she feels much better. She says she was also have sever muscle aches and had fallen a couple of times. She felt it was her cholesterol pill so she stopped it. She says she feel so much better after stopping it. She hasn't not had any more falls and says she feel more solid when she moves aournd instead of off balance.  She says she wants to hold off on trying another statin for now. She has has had some memory impairment but is not sure if that is better off the statin. She did have some mild carotid stenosis on the right ( < 50%). She canceled her MRI.   HTN - Doing well on half tab of 10 mg lisinopril. No CP, SOB, or dizziness. Her BP was too low on 10mg  but was too high after she stopped it. Says overall her BP has been up and down.   Review of Systems     Objective:   Physical Exam  Constitutional: She is oriented to person, place, and time. She appears well-developed and well-nourished.  HENT:  Head: Normocephalic and atraumatic.  Cardiovascular: Normal rate, regular rhythm and normal heart sounds.   Pulmonary/Chest: Effort normal and breath sounds normal.  Neurological: She is alert and oriented to person, place, and time.  Skin: Skin is warm and dry.  Psychiatric: She has a normal mood and affect. Her behavior is normal.          Assessment & Plan:  Dizziness- resolved  Falls - resolved.   HTN- Looks fantastic today. Change to 5mg  dose so doesn't have to cut tabs. F/U in 3-4 mo.   Hyperlipidemia - Discussed will need to consider maybe trial of 3rd statin. She agrees to think about it but wants to hold off for now    Vaccine: Discussed getting shingles vaccine. Rx given to take to the pharm.  Flu and Tdap given today.

## 2011-02-18 NOTE — Patient Instructions (Signed)
We will call you with your lab results 

## 2011-02-19 LAB — BASIC METABOLIC PANEL WITH GFR
BUN: 11 mg/dL (ref 6–23)
Calcium: 10 mg/dL (ref 8.4–10.5)
Creat: 0.78 mg/dL (ref 0.50–1.10)
GFR, Est Non African American: 74 mL/min — ABNORMAL LOW (ref >90)

## 2011-02-25 ENCOUNTER — Other Ambulatory Visit: Payer: Self-pay | Admitting: Family Medicine

## 2011-03-12 ENCOUNTER — Encounter: Payer: Self-pay | Admitting: Family Medicine

## 2011-03-12 ENCOUNTER — Ambulatory Visit (INDEPENDENT_AMBULATORY_CARE_PROVIDER_SITE_OTHER): Payer: Medicare PPO | Admitting: Family Medicine

## 2011-03-12 VITALS — BP 115/67 | HR 97 | Temp 97.5°F | Wt 158.0 lb

## 2011-03-12 DIAGNOSIS — J40 Bronchitis, not specified as acute or chronic: Secondary | ICD-10-CM

## 2011-03-12 MED ORDER — SULFAMETHOXAZOLE-TRIMETHOPRIM 800-160 MG PO TABS: 1.0000 | ORAL_TABLET | Freq: Two times a day (BID) | ORAL | Status: DC

## 2011-03-12 NOTE — Patient Instructions (Signed)
Call me if not better in one week.  

## 2011-03-12 NOTE — Progress Notes (Signed)
  Subjective:    Patient ID: Beth [name], female    DOB: 08-Dec-1932, 77 y.o.   MRN: [id number]  HPI Sick for 2 weeks.  Dry cough. No fever.  Eyes have been swollen and red.  +post nasal. + fatigue.  Cough is worse for the last day and is more productive though she feels better.  Took a zpack about 2 weeks ago from UC. Cough is working better. Using Tussionex and taking allegra. Mold ST x 2 days.  No nasal congestion.     Review of Systems     Objective:   Physical Exam  Constitutional: She is oriented to person, place, and time. She appears well-developed and well-nourished.  HENT:  Head: Normocephalic and atraumatic.  Right Ear: External ear normal.  Left Ear: External ear normal.  Nose: Nose normal.  Mouth/Throat: Oropharynx is clear and moist.       TMs and canals are clear.   Eyes: Conjunctivae and EOM are normal. Pupils are equal, round, and reactive to light.  Neck: Neck supple. No thyromegaly present.  Cardiovascular: Normal rate, regular rhythm and normal heart sounds.   Pulmonary/Chest: Effort normal and breath sounds normal. She has no wheezes.  Lymphadenopathy:    She has no cervical adenopathy.  Neurological: She is alert and oriented to person, place, and time.  Skin: Skin is warm and dry.  Psychiatric: She has a normal mood and affect.          Assessment & Plan:  Bronchitis - Her exam is normal today. No SOB.  Explained likely viral which is probably why didn't get better on zpack. Since she is feeling slightly better today i asked her to hold off on filling ABX, but will send over bactrim DS to pharmacy. Call if not better in one week. She already has cough med at home.

## 2011-03-16 ENCOUNTER — Inpatient Hospital Stay (HOSPITAL_COMMUNITY)
Admission: EM | Admit: 2011-03-16 | Discharge: 2011-03-18 | DRG: 312 | Disposition: A | Discharge status: Home Health | Payer: Medicare HMO | Attending: Internal Medicine | Admitting: Internal Medicine

## 2011-03-16 ENCOUNTER — Telehealth: Payer: Self-pay | Admitting: Family Medicine

## 2011-03-16 ENCOUNTER — Other Ambulatory Visit (HOSPITAL_COMMUNITY): Payer: Medicare PPO

## 2011-03-16 ENCOUNTER — Emergency Department (HOSPITAL_COMMUNITY): Payer: Medicare HMO

## 2011-03-16 ENCOUNTER — Inpatient Hospital Stay (HOSPITAL_COMMUNITY): Payer: Medicare PPO

## 2011-03-16 ENCOUNTER — Inpatient Hospital Stay (HOSPITAL_COMMUNITY): Payer: Medicare HMO

## 2011-03-16 ENCOUNTER — Encounter (HOSPITAL_COMMUNITY): Payer: Self-pay

## 2011-03-16 ENCOUNTER — Other Ambulatory Visit: Payer: Self-pay

## 2011-03-16 DIAGNOSIS — H3530 Unspecified macular degeneration: Secondary | ICD-10-CM

## 2011-03-16 DIAGNOSIS — I951 Orthostatic hypotension: Principal | ICD-10-CM | POA: Diagnosis present

## 2011-03-16 DIAGNOSIS — M899 Disorder of bone, unspecified: Secondary | ICD-10-CM

## 2011-03-16 DIAGNOSIS — S42009A Fracture of unspecified part of unspecified clavicle, initial encounter for closed fracture: Secondary | ICD-10-CM

## 2011-03-16 DIAGNOSIS — K5732 Diverticulitis of large intestine without perforation or abscess without bleeding: Secondary | ICD-10-CM

## 2011-03-16 DIAGNOSIS — Z9181 History of falling: Secondary | ICD-10-CM

## 2011-03-16 DIAGNOSIS — E871 Hypo-osmolality and hyponatremia: Secondary | ICD-10-CM

## 2011-03-16 DIAGNOSIS — R269 Unspecified abnormalities of gait and mobility: Secondary | ICD-10-CM

## 2011-03-16 DIAGNOSIS — W010XXA Fall on same level from slipping, tripping and stumbling without subsequent striking against object, initial encounter: Secondary | ICD-10-CM | POA: Diagnosis present

## 2011-03-16 DIAGNOSIS — R251 Tremor, unspecified: Secondary | ICD-10-CM

## 2011-03-16 DIAGNOSIS — R270 Ataxia, unspecified: Secondary | ICD-10-CM | POA: Diagnosis present

## 2011-03-16 DIAGNOSIS — IMO0001 Reserved for inherently not codable concepts without codable children: Secondary | ICD-10-CM

## 2011-03-16 DIAGNOSIS — E7800 Pure hypercholesterolemia, unspecified: Secondary | ICD-10-CM | POA: Diagnosis present

## 2011-03-16 DIAGNOSIS — K449 Diaphragmatic hernia without obstruction or gangrene: Secondary | ICD-10-CM

## 2011-03-16 DIAGNOSIS — F430 Acute stress reaction: Secondary | ICD-10-CM

## 2011-03-16 DIAGNOSIS — S42013A Anterior displaced fracture of sternal end of unspecified clavicle, initial encounter for closed fracture: Secondary | ICD-10-CM | POA: Diagnosis present

## 2011-03-16 DIAGNOSIS — T50995A Adverse effect of other drugs, medicaments and biological substances, initial encounter: Secondary | ICD-10-CM | POA: Diagnosis present

## 2011-03-16 DIAGNOSIS — Y92009 Unspecified place in unspecified non-institutional (private) residence as the place of occurrence of the external cause: Secondary | ICD-10-CM

## 2011-03-16 DIAGNOSIS — R011 Cardiac murmur, unspecified: Secondary | ICD-10-CM

## 2011-03-16 DIAGNOSIS — R3915 Urgency of urination: Secondary | ICD-10-CM | POA: Diagnosis present

## 2011-03-16 DIAGNOSIS — R279 Unspecified lack of coordination: Secondary | ICD-10-CM | POA: Diagnosis present

## 2011-03-16 DIAGNOSIS — I10 Essential (primary) hypertension: Secondary | ICD-10-CM

## 2011-03-16 LAB — URINALYSIS, ROUTINE W REFLEX MICROSCOPIC
Bilirubin Urine: NEGATIVE
Hgb urine dipstick: NEGATIVE
Nitrite: NEGATIVE
Protein, ur: NEGATIVE mg/dL
Specific Gravity, Urine: 1.016 (ref 1.005–1.030)
Urobilinogen, UA: 0.2 mg/dL (ref 0.0–1.0)

## 2011-03-16 LAB — CBC
HCT: 35.1 % — ABNORMAL LOW (ref 36.0–46.0)
Hemoglobin: 12.2 g/dL (ref 12.0–15.0)
MCH: 30.5 pg (ref 26.0–34.0)
MCHC: 34.2 g/dL (ref 30.0–36.0)
MCHC: 34.8 g/dL (ref 30.0–36.0)
MCV: 90 fL (ref 78.0–100.0)
MCV: 87.8 fL (ref 78.0–100.0)
Platelets: 239 10*3/uL (ref 150–400)
RDW: 13.9 % (ref 11.5–15.5)
RDW: 13.7 % (ref 11.5–15.5)
WBC: 8.9 10*3/uL (ref 4.0–10.5)

## 2011-03-16 LAB — DIFFERENTIAL
Basophils Relative: 1 % (ref 0–1)
Eosinophils Absolute: 0.2 10*3/uL (ref 0.0–0.7)
Eosinophils Relative: 3 % (ref 0–5)
Monocytes Absolute: 0.7 10*3/uL (ref 0.1–1.0)
Monocytes Relative: 9 % (ref 3–12)

## 2011-03-16 LAB — COMPREHENSIVE METABOLIC PANEL
ALT: 19 U/L (ref 0–35)
AST: 21 U/L (ref 0–37)
Alkaline Phosphatase: 72 U/L (ref 39–117)
Calcium: 9.2 mg/dL (ref 8.4–10.5)
Potassium: 4.1 mEq/L (ref 3.5–5.1)
Sodium: 124 mEq/L — ABNORMAL LOW (ref 135–145)
Total Protein: 6.5 g/dL (ref 6.0–8.3)

## 2011-03-16 LAB — POCT I-STAT, CHEM 8
BUN: 13 mg/dL (ref 6–23)
Calcium, Ion: 1.14 mmol/L (ref 1.12–1.32)
Chloride: 92 mEq/L — ABNORMAL LOW (ref 96–112)
Creatinine, Ser: 1 mg/dL (ref 0.50–1.10)
Glucose, Bld: 118 mg/dL — ABNORMAL HIGH (ref 70–99)

## 2011-03-16 LAB — POCT I-STAT TROPONIN I

## 2011-03-16 MED ORDER — ASPIRIN EC 81 MG PO TBEC
81.0000 mg | DELAYED_RELEASE_TABLET | Freq: Every day | ORAL | Status: DC
  Administered 2011-03-16 – 2011-03-18 (×3): 81 mg via ORAL
  Filled 2011-03-16 (×3): qty 1

## 2011-03-16 MED ORDER — SODIUM CHLORIDE 0.9 % IV SOLN
INTRAVENOUS | Status: DC
  Administered 2011-03-16 – 2011-03-17 (×2): via INTRAVENOUS

## 2011-03-16 MED ORDER — PANTOPRAZOLE SODIUM 40 MG PO TBEC
40.0000 mg | DELAYED_RELEASE_TABLET | Freq: Every day | ORAL | Status: DC
  Administered 2011-03-16 – 2011-03-18 (×3): 40 mg via ORAL
  Filled 2011-03-16 (×3): qty 1

## 2011-03-16 MED ORDER — CALCIUM CARBONATE 1250 (500 CA) MG PO TABS
1250.0000 mg | ORAL_TABLET | Freq: Two times a day (BID) | ORAL | Status: DC
  Administered 2011-03-16 – 2011-03-18 (×4): 1250 mg via ORAL
  Filled 2011-03-16 (×6): qty 1

## 2011-03-16 MED ORDER — OXYCODONE HCL 5 MG PO TABS: 5.0000 mg | ORAL_TABLET | ORAL | Status: DC | PRN

## 2011-03-16 MED ORDER — CLORAZEPATE DIPOTASSIUM 7.5 MG PO TABS
3.7500 mg | ORAL_TABLET | Freq: Every day | ORAL | Status: DC
  Administered 2011-03-16 – 2011-03-17 (×2): 3.75 mg via ORAL
  Filled 2011-03-16 (×2): qty 1

## 2011-03-16 MED ORDER — HYDROMORPHONE HCL PF 1 MG/ML IJ SOLN: 0.5000 mg | INTRAMUSCULAR | Status: DC | PRN

## 2011-03-16 MED ORDER — ALUM & MAG HYDROXIDE-SIMETH 200-200-20 MG/5ML PO SUSP: 30.0000 mL | Freq: Four times a day (QID) | ORAL | Status: DC | PRN

## 2011-03-16 MED ORDER — ACETAMINOPHEN 325 MG PO TABS
650.0000 mg | ORAL_TABLET | Freq: Four times a day (QID) | ORAL | Status: DC | PRN
  Administered 2011-03-16 – 2011-03-18 (×2): 650 mg via ORAL
  Filled 2011-03-16 (×2): qty 2

## 2011-03-16 MED ORDER — ONDANSETRON HCL 4 MG/2ML IJ SOLN: 4.0000 mg | Freq: Four times a day (QID) | INTRAMUSCULAR | Status: DC | PRN

## 2011-03-16 MED ORDER — ACETAMINOPHEN 650 MG RE SUPP: 650.0000 mg | Freq: Four times a day (QID) | RECTAL | Status: DC | PRN

## 2011-03-16 MED ORDER — FLUOXETINE HCL 20 MG PO CAPS
40.0000 mg | ORAL_CAPSULE | Freq: Every day | ORAL | Status: DC
  Administered 2011-03-16 – 2011-03-18 (×3): 40 mg via ORAL
  Filled 2011-03-16 (×3): qty 2

## 2011-03-16 MED ORDER — SENNA 8.6 MG PO TABS: 2.0000 | ORAL_TABLET | Freq: Every day | ORAL | Status: DC | PRN

## 2011-03-16 MED ORDER — ONE-DAILY MULTI VITAMINS PO TABS
1.0000 | ORAL_TABLET | Freq: Every day | ORAL | Status: DC
  Administered 2011-03-16: 1 via ORAL

## 2011-03-16 MED ORDER — LORATADINE 10 MG PO TABS
10.0000 mg | ORAL_TABLET | Freq: Every day | ORAL | Status: DC
  Administered 2011-03-16 – 2011-03-18 (×3): 10 mg via ORAL
  Filled 2011-03-16 (×3): qty 1

## 2011-03-16 MED ORDER — IMIPRAMINE HCL 50 MG PO TABS
50.0000 mg | ORAL_TABLET | Freq: Every day | ORAL | Status: DC
  Administered 2011-03-16 – 2011-03-17 (×2): 50 mg via ORAL
  Filled 2011-03-16 (×3): qty 1

## 2011-03-16 MED ORDER — ENOXAPARIN SODIUM 40 MG/0.4ML ~~LOC~~ SOLN
40.0000 mg | SUBCUTANEOUS | Status: DC
  Administered 2011-03-16 – 2011-03-17 (×2): 40 mg via SUBCUTANEOUS
  Filled 2011-03-16 (×3): qty 0.4

## 2011-03-16 MED ORDER — LISINOPRIL 5 MG PO TABS
5.0000 mg | ORAL_TABLET | Freq: Every day | ORAL | Status: DC
  Administered 2011-03-16 – 2011-03-18 (×3): 5 mg via ORAL
  Filled 2011-03-16 (×3): qty 1

## 2011-03-16 MED ORDER — VITAMIN D3 25 MCG (1000 UNIT) PO TABS
1000.0000 [IU] | ORAL_TABLET | Freq: Every day | ORAL | Status: DC
  Administered 2011-03-16 – 2011-03-18 (×3): 1000 [IU] via ORAL
  Filled 2011-03-16 (×3): qty 1

## 2011-03-16 MED ORDER — THERA M PLUS PO TABS
1.0000 | ORAL_TABLET | Freq: Every day | ORAL | Status: DC
  Administered 2011-03-16: 16:00:00 via ORAL
  Administered 2011-03-17: 1 via ORAL
  Administered 2011-03-18: 10:00:00 via ORAL
  Filled 2011-03-16 (×3): qty 1

## 2011-03-16 MED ORDER — ONDANSETRON HCL 4 MG PO TABS: 4.0000 mg | ORAL_TABLET | Freq: Four times a day (QID) | ORAL | Status: DC | PRN

## 2011-03-16 MED ORDER — HYDROCODONE-ACETAMINOPHEN 5-500 MG PO TABS: 1.0000 | ORAL_TABLET | Freq: Four times a day (QID) | ORAL | Status: AC | PRN

## 2011-03-16 MED ORDER — FENTANYL CITRATE 0.05 MG/ML IJ SOLN
50.0000 ug | Freq: Once | INTRAMUSCULAR | Status: AC
  Administered 2011-03-16: 100 ug via INTRAVENOUS
  Filled 2011-03-16: qty 2

## 2011-03-16 NOTE — Telephone Encounter (Signed)
Received call from Dr. Nicanor Alcon ER physician re: pt seen last night at ER with dizziness and recent fall with clavicle fracture.  To schedule ortho f/u for her.  H/o recurrent falls.  CT head with lacunar infarcts per EDP, who had recommended admission for MRI and PT/OT however pt adamant about not being admitted as has to care for husband at home.  EDP concerned about recurrent falls.  Advised I also recommended admission however if pt leaves AMA pt should f/u closely with PCP this week in office.  Will route to PCP as fyi.

## 2011-03-16 NOTE — ED Notes (Addendum)
MD at bedside. 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 Dr. Nicanor Alcon states that she wants a cross body immobilizer for her L shoulder.

## 2011-03-16 NOTE — Telephone Encounter (Signed)
Please call pt and make sure has appt this week with me.

## 2011-03-16 NOTE — ED Notes (Signed)
Pt states that she went from a sitting to a standing position tonight when she lost her balance and fell striking her head and shoulder on the floor.  Pt has a knot on her posterior head and is guarding her right shoulder.  Pt also is complaining of a cough that is productive that she has had for several weeks.  Pt denies LOC N/V.

## 2011-03-16 NOTE — ED Provider Notes (Addendum)
History     CSN: [id number] Arrival date & time: 03/16/2011  2:14 AM   First MD Initiated Contact with Patient 03/16/11 0320      Chief Complaint  Patient presents with  . Shoulder Pain  . Near Syncope  . Dizziness  . Fall    (Consider location/radiation/quality/duration/timing/severity/associated sxs/prior treatment) Patient is a 77 y.o. female presenting with shoulder pain and fall. The history is provided by the patient. No language interpreter was used.  Shoulder Pain This is a new problem. The current episode started 1 to 2 hours ago. The problem occurs constantly. The problem has not changed since onset.Pertinent negatives include no chest pain, no abdominal pain, no headaches and no shortness of breath. Associated symptoms comments: Frequent recent falls. The symptoms are aggravated by nothing. The symptoms are relieved by nothing. She has tried nothing for the symptoms. The treatment provided no relief.  Fall Pertinent negatives include no numbness, no abdominal pain and no headaches.    Past Medical History  Diagnosis Date  . Hypertension   . Hyperlipidemia   . Anxiety     situational  . Insomnia     Past Surgical History  Procedure Date  . Abdominal hysterectomy   . Bladder surgery     tac    Family History  Problem Relation Age of Onset  . Cancer Mother     breast  . Hyperlipidemia Mother   . Hypertension Mother   . Heart disease Father     MI  . Depression Father   . Hyperlipidemia Father   . Dementia Mother     History  Substance Use Topics  . Smoking status: Former Games developer  . Smokeless tobacco: Not on file  . Alcohol Use: No    OB History    Grav Para Term Preterm Abortions TAB SAB Ect Mult Living                  Review of Systems  Constitutional: Negative for activity change.  HENT: Negative for facial swelling.   Eyes: Negative for discharge.  Respiratory: Negative for shortness of breath.   Cardiovascular: Negative for chest  pain.  Gastrointestinal: Negative for abdominal pain and abdominal distention.  Genitourinary: Negative for difficulty urinating.  Musculoskeletal: Positive for arthralgias.  Skin: Negative.   Neurological: Positive for tremors. Negative for dizziness, seizures, syncope, speech difficulty, weakness, numbness and headaches.  Hematological: Negative.   Psychiatric/Behavioral: Negative.     Allergies  Pravastatin and Simvastatin  Home Medications   Current Outpatient Rx  Name Route Sig Dispense Refill  . CALCIUM CARBONATE 1250 MG PO CAPS Oral Take 1,250 mg by mouth 2 (two) times daily with a meal.      . VITAMIN D3 1000 UNITS PO TABS Oral Take 1,000 Units by mouth daily.      Marland Kitchen CLORAZEPATE DIPOTASSIUM 3.75 MG PO TABS  TAKE ONE TABLET BY MOUTH AT BEDTIME 30 tablet 0  . FEXOFENADINE HCL 180 MG PO TABS Oral Take 180 mg by mouth daily.      Marland Kitchen FLUOXETINE HCL 40 MG PO CAPS  TAKE ONE CAPSULE BY MOUTH EVERY DAY 30 capsule 3  . GUAIFENESIN 600 MG PO TB12 Oral Take 1,200 mg by mouth 2 (two) times daily.      . IMIPRAMINE HCL 50 MG PO TABS  TAKE ONE TABLET BY MOUTH AT BEDTIME 30 tablet 1  . LISINOPRIL 5 MG PO TABS Oral Take 1 tablet (5 mg total) by mouth daily. 30  tablet 6  . ONE-DAILY MULTI VITAMINS PO TABS Oral Take 1 tablet by mouth daily.      Marland Kitchen PRESERVISION/LUTEIN PO CAPS Oral Take by mouth.      . OMEPRAZOLE 20 MG PO CPDR Oral Take 20 mg by mouth daily.      Marland Kitchen VITAMIN B-6 500 MG PO TABS Oral Take 500 mg by mouth daily.      . SULFAMETHOXAZOLE-TRIMETHOPRIM 800-160 MG PO TABS Oral Take 1 tablet by mouth 2 (two) times daily. 20 tablet 0  . VITAMIN B-12 1000 MCG PO TABS Oral Take 1,000 mcg by mouth daily.      . AMBULATORY NON FORMULARY MEDICATION  Medication Name: zostavax im x 1 1 vial 0    BP 168/72  Pulse 88  Temp(Src) 96.8 F (36 C) (Oral)  Resp 17  Wt 158 lb (71.668 kg)  SpO2 100%  Physical Exam  Nursing note and vitals reviewed. Constitutional: She is oriented to person, place,  and time. She appears well-developed and well-nourished.  HENT:  Head: Normocephalic and atraumatic.  Right Ear: No hemotympanum.  Left Ear: No hemotympanum.  Eyes: EOM are normal. Pupils are equal, round, and reactive to light.  Neck: Normal range of motion. Neck supple. No tracheal deviation present.  Cardiovascular: Normal rate and regular rhythm.   Pulmonary/Chest: Effort normal and breath sounds normal.  Abdominal: Soft. Bowel sounds are normal.  Musculoskeletal: Normal range of motion.  Neurological: She is alert and oriented to person, place, and time.  Skin: Skin is warm and dry.  Psychiatric: Thought content normal.    ED Course  Procedures (including critical care time)   Labs Reviewed  CBC  URINALYSIS, ROUTINE W REFLEX MICROSCOPIC  POCT I-STAT TROPONIN I  I-STAT TROPONIN I  I-STAT, CHEM 8   Dg Chest 2 View  03/16/2011  *RADIOLOGY REPORT*  Clinical Data: Cough, congestion.  CHEST - 2 VIEW  Comparison: 05/03/2010  Findings: The patient is rotated.  Mild linear bibasilar opacities likely reflects scarring or atelectasis.  No focal areas of consolidation otherwise.  No pneumothorax.  No pleural effusion. Distal clavicle fracture again noted.  IMPRESSION: Linear lung base opacities likely reflect scarring or atelectasis. No focal consolidation otherwise.  Original Report Authenticated By: Waneta Martins, M.D.   Ct Head Wo Contrast  03/16/2011  *RADIOLOGY REPORT*  Clinical Data: Fall, trauma.  CT HEAD WITHOUT CONTRAST  Technique:  Contiguous axial images were obtained from the base of the skull through the vertex without contrast.  Comparison: None.  Findings: Prominence of the sulci, cisterns, and ventricles, in keeping with volume loss. There are subcortical and periventricular white matter hypodensities, a nonspecific finding most often seen with chronic microangiopathic changes.  There is no evidence for acute hemorrhage, overt hydrocephalus, mass lesion, or abnormal  extra-axial fluid collection.  No definite CT evidence for acute cortical based (large artery) infarction. Right posterior scalp hematoma no displaced calvarial fracture. Right lens replacement. The visualized paranasal sinuses and mastoid air cells are predominately clear.  IMPRESSION: Right posterior scalp hematoma.  No underlying calvarial fracture.  White matter hypodensities are nonspecific however often secondary to chronic microangiopathic change.  No definite acute intracranial abnormality.  Original Report Authenticated By: Waneta Martins, M.D.   Dg Shoulder Left  03/16/2011  *RADIOLOGY REPORT*  Clinical Data: Anterior shoulder pain status post fall.  LEFT SHOULDER - 2+ VIEW  Comparison: None.  Findings: Distal clavicle fracture. Superior displacement of the proximal segment.  Acromioclavicular joint remains intact.Glenohumeral  joint DJD.  No fracture or dislocation of the glenohumeral joint. Left lung interstitial prominence without focal consolidation.  IMPRESSION: Distal left clavicle fracture.  Original Report Authenticated By: Waneta Martins, M.D.     No diagnosis found.    MDM   Date: 03/16/2011  Rate: 85  Rhythm: normal sinus rhythm  QRS Axis: normal  Intervals: normal  ST/T Wave abnormalities: normal  Conduction Disutrbances:none  Narrative Interpretation:   Old EKG Reviewed: none available  721 case d/w Dr, Cleophas Dunker of orthopedics shoulder immobilizer and follow up in the office next week 735 case d/w Dr. Sharen Hones who would recommend admission    Kedric Bumgarner K Aedan Geimer-Rasch, MD 03/16/11 0819  Gayle Martinez K Mimi Debellis-Rasch, MD 03/16/11 [phone]

## 2011-03-16 NOTE — ED Notes (Signed)
Pt reports falling a lot- reports feeling dizzy.  Denies LOC. Pt reports shoulder pain left side and hit head "both sides" denies neck and back pain

## 2011-03-16 NOTE — H&P (Signed)
Hospital Admission Note Date: 03/16/2011  Patient name: Beth [name] Medical record number: [id number] Date of birth: [birth year] Age: 77 y.o. Gender: female PCP: METHENEY,CATHERINE, MD, MD  Attending physician: Carollee Massed  Chief Complaint: Off-balance  History of Present Illness: Beth [name] is an 77 y.o. female with past medical history significant for hypertension and hyperlipidemia.  Patient stated that for the past 3 weeks she has been off balance.  She has to walk towards th side. She has also fallen 3 times over the past 3 weeks. The last time she fell she fractured her clavicle. She also stated that for the past 4 weeks she feels as if she has a respiratory infection she saw her primary care physician who started her on Bactrim. She was also diagnosed with high cholesterol and started on statin which she could not tolerate and the statin was discontinued. She's had no chest pain no shortness of breath no problems swallowing no focal deficits,no difficulty speaking.  She does complain of a lot of stress. 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   Past Medical History  Diagnosis Date  . Hypertension   . Hyperlipidemia   . Anxiety     situational  . Insomnia    Past Surgical History  Procedure Date  . Abdominal hysterectomy   . Bladder surgery     tac   Family History  Problem Relation Age of Onset  . Cancer Mother     breast  . Hyperlipidemia Mother   . Hypertension Mother   . Heart disease Father     MI  . Depression Father   . Hyperlipidemia Father   . Dementia Mother    History   Social History  . Marital Status: Married    Spouse Name: N/A    Number of Children: N/A  . Years of Education: N/A   Occupational History  . Not on file.   Social History Main Topics  . Smoking status: Former Games developer  . Smokeless tobacco: Not on file  . Alcohol Use: No  . Drug Use: No  . Sexually Active:    Other  Topics Concern  . Not on file   Social History Narrative  . No narrative on file   Meds: Reviewed  Allergies: Pravastatin and Simvastatin  Review of Systems: Pertinent items are noted in HPI.  Physical Exam: Blood pressure 168/72, pulse 88, temperature 96.8 F (36 C), temperature source Oral, resp. rate 17, weight 71.668 kg (158 lb), SpO2 100.00%. General: Patient appears her stated age. HEENT: Head normocephalic atraumatic. Lungs: Clear to auscultation bilaterally. Cardiovascular: Regular rate rhythm. Abdomen: Soft nontender nondistended positive bowel sounds. Extremities: No edema. Neuro exam nonfocal.   Lab results: Reviewed Imaging results:  Dg Chest 2 View  03/16/2011  *RADIOLOGY REPORT*  Clinical Data: Cough, congestion.  CHEST - 2 VIEW  Comparison: 05/03/2010  Findings: The patient is rotated.  Mild linear bibasilar opacities likely reflects scarring or atelectasis.  No focal areas of consolidation otherwise.  No pneumothorax.  No pleural effusion. Distal clavicle fracture again noted.  IMPRESSION: Linear lung base opacities likely reflect scarring or atelectasis. No focal consolidation otherwise.  Original Report Authenticated By: Waneta Martins, M.D.   Ct Head Wo Contrast  03/16/2011  *RADIOLOGY REPORT*  Clinical Data: Fall, trauma.  CT HEAD WITHOUT CONTRAST  Technique:  Contiguous axial images were obtained from the base of the skull through the vertex without contrast.  Comparison:  None.  Findings: Prominence of the sulci, cisterns, and ventricles, in keeping with volume loss. There are subcortical and periventricular white matter hypodensities, a nonspecific finding most often seen with chronic microangiopathic changes.  There is no evidence for acute hemorrhage, overt hydrocephalus, mass lesion, or abnormal extra-axial fluid collection.  No definite CT evidence for acute cortical based (large artery) infarction. Right posterior scalp hematoma no displaced  calvarial fracture. Right lens replacement. The visualized paranasal sinuses and mastoid air cells are predominately clear.  IMPRESSION: Right posterior scalp hematoma.  No underlying calvarial fracture.  White matter hypodensities are nonspecific however often secondary to chronic microangiopathic change.  No definite acute intracranial abnormality.  Original Report Authenticated By: Waneta Martins, M.D.   Dg Shoulder Left  03/16/2011  *RADIOLOGY REPORT*  Clinical Data: Anterior shoulder pain status post fall.  LEFT SHOULDER - 2+ VIEW  Comparison: None.  Findings: Distal clavicle fracture. Superior displacement of the proximal segment.  Acromioclavicular joint remains intact.Glenohumeral joint DJD.  No fracture or dislocation of the glenohumeral joint. Left lung interstitial prominence without focal consolidation.  IMPRESSION: Distal left clavicle fracture.  Original Report Authenticated By: Waneta Martins, M.D.    Assessment & Plan: Ataxia Patient does complain of ataxia. The ER physician walked patient and thought she was truly off-balance. Will admit patient to the hospital for a stroke workup and get physical therapy consult. Will start her on aspirin 81 mg daily.   HYPERCHOLESTEROLEMIA Patient unable to tolerate statin. She could try Zetia outpatient with her primary care physician.   HYPERTENSION, BENIGN Continue home medications.   FIBROMYALGIA Continue antidepressant.   URINARY URGENCY Stable and chronic  Earlene Plater MD, Ladell Pier 03/16/2011, 11:18 AM

## 2011-03-17 DIAGNOSIS — I517 Cardiomegaly: Secondary | ICD-10-CM

## 2011-03-17 LAB — BASIC METABOLIC PANEL
BUN: 11 mg/dL (ref 6–23)
Creatinine, Ser: 0.68 mg/dL (ref 0.50–1.10)
GFR calc non Af Amer: 83 mL/min — ABNORMAL LOW (ref >90)
Glucose, Bld: 147 mg/dL — ABNORMAL HIGH (ref 70–99)
Potassium: 4 mEq/L (ref 3.5–5.1)

## 2011-03-17 LAB — CBC
Hemoglobin: 11.3 g/dL — ABNORMAL LOW (ref 12.0–15.0)
MCH: 29.8 pg (ref 26.0–34.0)
MCHC: 32.8 g/dL (ref 30.0–36.0)
RDW: 14 % (ref 11.5–15.5)

## 2011-03-17 NOTE — Progress Notes (Signed)
Occupational Therapy Evaluation Patient Details Name: Beth [name] MRN: [id number] DOB: [birth year] Today's Date: 03/17/2011 1430 1453  Problem List:  Patient Active Problem List  Diagnoses  . HYPERCHOLESTEROLEMIA  . MACULAR DEGENERATION, RIGHT EYE  . HYPERTENSION, BENIGN  . HIATAL HERNIA WITH REFLUX  . DIVERTICULITIS, COLON  . FIBROMYALGIA  . OSTEOPENIA  . MURMUR  . URINARY URGENCY  . Reaction, situational, acute, to stress  . Ataxia    Past Medical History:  Past Medical History  Diagnosis Date  . Hypertension   . Hyperlipidemia   . Anxiety     situational  . Insomnia    Past Surgical History:  Past Surgical History  Procedure Date  . Abdominal hysterectomy   . Bladder surgery     tac    OT Assessment/Plan/Recommendation OT Assessment Clinical Impression Statement: pt would benefit from skilled OT to reach a supervision level with adls in acute OT Recommendation/Assessment: Patient will need skilled OT in the acute care venue OT Problem List: Decreased strength;Decreased activity tolerance;Impaired balance (sitting and/or standing);Decreased coordination;Impaired UE functional use OT Therapy Diagnosis : Generalized weakness OT Plan OT Frequency: Min 2X/week OT Treatment/Interventions: Self-care/ADL training;Therapeutic activities;Patient/family education;Balance training;Other (comment) (compensation for UE tremors) OT Recommendation Follow Up Recommendations: Home health OT Equipment Recommended: None recommended by OT Individuals Consulted Consulted and Agree with Results and Recommendations: Patient OT Goals Acute Rehab OT Goals OT Goal Formulation: With patient Time For Goal Achievement: 2 weeks ADL Goals Pt Will Perform Grooming: with supervision;Standing at sink ADL Goal: Grooming - Progress: Progressing toward goals Pt Will Perform Lower Body Bathing: with supervision;Sit to stand from chair ADL Goal: Lower Body Bathing - Progress: Progressing  toward goals Pt Will Perform Lower Body Dressing: with supervision;Sit to stand from chair ADL Goal: Lower Body Dressing - Progress: Progressing toward goals Pt Will Transfer to Toilet: with supervision;Ambulation;3-in-1;Other (comment) (with AD prn) ADL Goal: Toilet Transfer - Progress: Progressing toward goals Pt Will Perform Toileting - Clothing Manipulation: with supervision;Standing ADL Goal: Toileting - Clothing Manipulation - Progress: Progressing toward goals Pt Will Perform Toileting - Hygiene: with supervision;Sit to stand from 3-in-1/toilet ADL Goal: Toileting - Hygiene - Progress: Progressing toward goals Miscellaneous OT Goals Miscellaneous OT Goal #1: pt will utilize strategies to minimize UE tremors during functional tasks OT Goal: Miscellaneous Goal #1 - Progress: Progressing toward goals  OT Evaluation Precautions/Restrictions  Precautions Precautions: Fall Restrictions Weight Bearing Restrictions: No Prior Functioning Home Living Bathroom Shower/Tub: Walk-in shower;Tub/shower unit (she uses walk in but can get to bottom of tub) Bathroom Toilet: Standard (husband has 3:1)   ADL ADL Grooming: Set up Where Assessed - Grooming: Sitting, bed;Unsupported Upper Body Bathing: Simulated;Set up Where Assessed - Upper Body Bathing: Sitting, bed;Unsupported Lower Body Bathing: Simulated;Minimal assistance Where Assessed - Lower Body Bathing: Sit to stand from bed Upper Body Dressing: Simulated;Set up Where Assessed - Upper Body Dressing: Unsupported;Sitting, bed Lower Body Dressing: Performed;Minimal assistance Lower Body Dressing Details (indicate cue type and reason):  (mod A for socks; RUE with intention tremor) Where Assessed - Lower Body Dressing: Sit to stand from bed Toilet Transfer: Minimal assistance Toilet Transfer Method: Other (comment);Ambulating (simulated; walked to sink and back to bed) Toileting - Clothing Manipulation: Simulated;Minimal assistance;Other  (comment) (min guard) Where Assessed - Toileting Clothing Manipulation: Standing Toileting - Hygiene: Simulated;Minimal assistance Where Assessed - Toileting Hygiene: Sit to stand from 3-in-1 or toilet Equipment Used: Other (comment) (initially used IV pole but feel too close together; then HHA) ADL Comments: Pt  very motivated and Independent.  LUE immobilized; RUE with new tremor.  Doesn't know what caused falls--feet in way; states son can walk with her Vision/Perception  Vision - History Baseline Vision: Other (comment) Visual History: Macular degeneration Patient Visual Report: No change from baseline Cognition Cognition Overall Cognitive Status: Appears within functional limits for tasks assessed Sensation/Coordination Sensation Additional Comments: does not report problems with sensation Coordination Fine Motor Movements are Fluid and Coordinated:  (pt with intention tremor; educated on closed chain for funct) Extremity Assessment RUE Assessment RUE Assessment: Within Functional Limits LUE Assessment LUE Assessment: Not tested (pt immobilized in sling secondary to distal clavicle fx) Mobility  Bed Mobility Bed Mobility: Yes Supine to Sit: 7: Independent Transfers Sit to Stand: 4: Min assist;Other (comment) (min guard) Exercises   End of Session OT - End of Session Activity Tolerance: Patient tolerated treatment well Patient left: in bed;with call bell in reach General Behavior During Session: [facility] for tasks performed Cognition: [facility] for tasks performed   Keyle Doby [phone] 03/17/2011, 3:12 PM

## 2011-03-17 NOTE — Progress Notes (Signed)
Subjective: Patient feels fine today.  Objective: Vital signs in last 24 hours: Temp:  [97.5 F (36.4 C)-98.2 F (36.8 C)] 97.5 F (36.4 C) (11/19 1506) Pulse Rate:  [79-84] 79  (11/19 1506) Resp:  [16-18] 18  (11/19 1506) BP: (114-129)/(69-73) 114/69 mmHg (11/19 1506) SpO2:  [93 %-97 %] 97 % (11/19 1506) Weight change: 1.932 kg (4 lb 4.1 oz)  -Intake/Output from previous day: 11/18 0701 - 11/19 0700 In: 1100.8 [P.O.:240; I.V.:860.8] Out: 1850 [Urine:1850]  Blood pressure 114/69, pulse 79, temperature 97.5 F (36.4 C), temperature source Oral, resp. rate 18, height 5\' 4"  (1.626 m), weight 73.6 kg (162 lb 4.1 oz), SpO2 97.00%. HEENT: normal Cardio: RRR Resp: CTA B/L GI: BS positive Extremity:  No Edema  Lab Results: Lab Results  Component Value Date   WBC 6.1 03/17/2011   HGB 11.3* 03/17/2011   HCT 34.5* 03/17/2011   MCV 91.0 03/17/2011   PLT 223 03/17/2011    BMET  Lab 03/17/11 0518  NA 130*  K 4.0  CL 98  CO2 22  BUN 11  CREATININE 0.68  LABGLOM --  GLUCOSE 147*  CALCIUM 9.0    Studies/Results: Dg Chest 2 View  03/16/2011  *RADIOLOGY REPORT*  Clinical Data: Cough, congestion.  CHEST - 2 VIEW  Comparison: 05/03/2010  Findings: The patient is rotated.  Mild linear bibasilar opacities likely reflects scarring or atelectasis.  No focal areas of consolidation otherwise.  No pneumothorax.  No pleural effusion. Distal clavicle fracture again noted.  IMPRESSION: Linear lung base opacities likely reflect scarring or atelectasis. No focal consolidation otherwise.  Original Report Authenticated By: Waneta Martins, M.D.   Ct Head Wo Contrast  03/16/2011  *RADIOLOGY REPORT*  Clinical Data: Fall, trauma.  CT HEAD WITHOUT CONTRAST  Technique:  Contiguous axial images were obtained from the base of the skull through the vertex without contrast.  Comparison: None.  Findings: Prominence of the sulci, cisterns, and ventricles, in keeping with volume loss. There are  subcortical and periventricular white matter hypodensities, a nonspecific finding most often seen with chronic microangiopathic changes.  There is no evidence for acute hemorrhage, overt hydrocephalus, mass lesion, or abnormal extra-axial fluid collection.  No definite CT evidence for acute cortical based (large artery) infarction. Right posterior scalp hematoma no displaced calvarial fracture. Right lens replacement. The visualized paranasal sinuses and mastoid air cells are predominately clear.  IMPRESSION: Right posterior scalp hematoma.  No underlying calvarial fracture.  White matter hypodensities are nonspecific however often secondary to chronic microangiopathic change.  No definite acute intracranial abnormality.  Original Report Authenticated By: Waneta Martins, M.D.   Mr Angiogram Head Wo Contrast  03/16/2011  *RADIOLOGY REPORT*  Clinical Data:  Ataxia  MRI HEAD WITHOUT CONTRAST MRA HEAD WITHOUT CONTRAST  Technique:  Multiplanar, multiecho pulse sequences of the brain and surrounding structures were obtained without intravenous contrast. Angiographic images of the head were obtained using MRA technique without contrast.  Comparison:  03/16/2011  MRI HEAD  Findings:  Negative for acute infarct.  Scattered white matter hyperintensities bilaterally compatible with chronic microvascular ischemia.  Mild chronic ischemia in the pons. Micro hemorrhage   right parietal white matter and left posterior temporal lobe, likely related to hypertensive bleed.  Negative for mass or edema.  IMPRESSION: Chronic microvascular ischemic change.  No acute infarct or mass.  MRA HEAD  Findings: Image quality degraded by motion.  Both vertebral arteries are patent.  Basilar and posterior cerebral arteries are patent.  Fetal origin of  the posterior cerebral artery bilaterally.  Internal carotid artery is patent bilaterally without stenosis. Anterior and middle cerebral arteries are patent bilaterally. Probable moderate  stenosis in the parietal branch of the left middle cerebral artery  Negative for aneurysm.  IMPRESSION: Image quality degraded by motion.  Probable stenosis in the parietal branch of the left middle cerebral artery.  Original Report Authenticated By: Camelia Phenes, M.D.   Mr Brain Wo Contrast  03/16/2011  *RADIOLOGY REPORT*  Clinical Data:  Ataxia  MRI HEAD WITHOUT CONTRAST MRA HEAD WITHOUT CONTRAST  Technique:  Multiplanar, multiecho pulse sequences of the brain and surrounding structures were obtained without intravenous contrast. Angiographic images of the head were obtained using MRA technique without contrast.  Comparison:  03/16/2011  MRI HEAD  Findings:  Negative for acute infarct.  Scattered white matter hyperintensities bilaterally compatible with chronic microvascular ischemia.  Mild chronic ischemia in the pons. Micro hemorrhage   right parietal white matter and left posterior temporal lobe, likely related to hypertensive bleed.  Negative for mass or edema.  IMPRESSION: Chronic microvascular ischemic change.  No acute infarct or mass.  MRA HEAD  Findings: Image quality degraded by motion.  Both vertebral arteries are patent.  Basilar and posterior cerebral arteries are patent.  Fetal origin of the posterior cerebral artery bilaterally.  Internal carotid artery is patent bilaterally without stenosis. Anterior and middle cerebral arteries are patent bilaterally. Probable moderate stenosis in the parietal branch of the left middle cerebral artery  Negative for aneurysm.  IMPRESSION: Image quality degraded by motion.  Probable stenosis in the parietal branch of the left middle cerebral artery.  Original Report Authenticated By: Camelia Phenes, M.D.   Dg Shoulder Left  03/16/2011  *RADIOLOGY REPORT*  Clinical Data: Anterior shoulder pain status post fall.  LEFT SHOULDER - 2+ VIEW  Comparison: None.  Findings: Distal clavicle fracture. Superior displacement of the proximal segment.  Acromioclavicular  joint remains intact.Glenohumeral joint DJD.  No fracture or dislocation of the glenohumeral joint. Left lung interstitial prominence without focal consolidation.  IMPRESSION: Distal left clavicle fracture.  Original Report Authenticated By: Waneta Martins, M.D.    Medications:  Current Facility-Administered Medications  Medication Dose Route Frequency Provider Last Rate Last Dose  . 0.9 %  sodium chloride infusion   Intravenous Continuous Rosalia Mcavoy Jarrett-Davis 50 mL/hr at 03/17/11 1546    . acetaminophen (TYLENOL) tablet 650 mg  650 mg Oral Q6H PRN Daphane Odekirk Jarrett-Davis   650 mg at 03/16/11 2127   Or  . acetaminophen (TYLENOL) suppository 650 mg  650 mg Rectal Q6H PRN Johnross Nabozny Jarrett-Davis      . alum & mag hydroxide-simeth (MAALOX/MYLANTA) 200-200-20 MG/5ML suspension 30 mL  30 mL Oral Q6H PRN Jamey Harman Jarrett-Davis      . aspirin EC tablet 81 mg  81 mg Oral Daily Jaksen Fiorella Jarrett-Davis   81 mg at 03/17/11 0946  . calcium carbonate (OS-CAL - dosed in mg of elemental calcium) tablet 1,250 mg  1,250 mg Oral BID WC Renika Shiflet Jarrett-Davis   1,250 mg at 03/17/11 1103  . cholecalciferol (VITAMIN D) tablet 1,000 Units  1,000 Units Oral Daily Etoile Looman Jarrett-Davis   1,000 Units at 03/17/11 0951  . clorazepate (TRANXENE) tablet 3.75 mg  3.75 mg Oral QHS Samari Bittinger Jarrett-Davis   3.75 mg at 03/16/11 2119  . enoxaparin (LOVENOX) injection 40 mg  40 mg Subcutaneous Q24H Izela Altier Jarrett-Davis   40 mg at 03/17/11 1425  . FLUoxetine (PROZAC) capsule 40 mg  40 mg Oral Daily  Rumaisa Schnetzer Jarrett-Davis   40 mg at 03/17/11 0951  . HYDROmorphone (DILAUDID) injection 0.5 mg  0.5 mg Intravenous Q4H PRN Jahnia Hewes Jarrett-Davis      . imipramine (TOFRANIL) tablet 50 mg  50 mg Oral QHS Lyriq Jarchow Jarrett-Davis   50 mg at 03/16/11 2119  . lisinopril (PRINIVIL,ZESTRIL) tablet 5 mg  5 mg Oral Daily Sayeed Weatherall Jarrett-Davis   5 mg at 03/17/11 0954  . loratadine (CLARITIN) tablet 10 mg  10 mg Oral Daily Brynja Marker Jarrett-Davis   10 mg at 03/17/11 0952    . multivitamins ther. w/minerals tablet 1 tablet  1 tablet Oral Daily Larey Dresser, PHARMD   1 tablet at 03/17/11 4098  . ondansetron (ZOFRAN) tablet 4 mg  4 mg Oral Q6H PRN Carren Blakley Jarrett-Davis       Or  . ondansetron (ZOFRAN) injection 4 mg  4 mg Intravenous Q6H PRN Jaylah Goodlow Jarrett-Davis      . oxyCODONE (Oxy IR/ROXICODONE) immediate release tablet 5 mg  5 mg Oral Q4H PRN Keltie Labell Jarrett-Davis      . pantoprazole (PROTONIX) EC tablet 40 mg  40 mg Oral Q1200 Kannan Proia Jarrett-Davis   40 mg at 03/17/11 1425  . senna (SENOKOT) tablet 17.2 mg  2 tablet Oral Daily PRN Tenecia Ignasiak Jarrett-Davis        Assessment/Plan: Ataxia Patient ataxia workup is in progress. Will ask psych to see her to help with medications. PT/OT.  HYPERCHOLESTEROLEMIA Patient unable to tolerate statin medications. Diet and exercise.  HYPERTENSION, BENIGN Continue Zestril    FIBROMYALGIA Stable  URINARY URGENCY Follow up outpatient with urology    LOS: 1 day   Earlene Plater MD, [place] 03/17/2011, 6:13 PM

## 2011-03-17 NOTE — Progress Notes (Signed)
Physical Therapy Evaluation Patient Details Name: Beth [name] MRN: [id number] DOB: 03-24-1933 Today's Date: 03/17/2011 Time: 4098-1191  Eval II Problem List:  Patient Active Problem List  Diagnoses  . HYPERCHOLESTEROLEMIA  . MACULAR DEGENERATION, RIGHT EYE  . HYPERTENSION, BENIGN  . HIATAL HERNIA WITH REFLUX  . DIVERTICULITIS, COLON  . FIBROMYALGIA  . OSTEOPENIA  . MURMUR  . URINARY URGENCY  . Reaction, situational, acute, to stress  . Ataxia    Past Medical History:  Past Medical History  Diagnosis Date  . Hypertension   . Hyperlipidemia   . Anxiety     situational  . Insomnia    Past Surgical History:  Past Surgical History  Procedure Date  . Abdominal hysterectomy   . Bladder surgery     tac    PT Assessment/Plan/Recommendation PT Assessment Clinical Impression Statement: Pt presents with diagnosis of ataxia. Pt will benefit from skilled PT in the acute care setting to improve gait and balance in order to maximize independence with functional activities in preperation for DC home.  PT Recommendation/Assessment: Patient will need skilled PT in the acute care venue PT Problem List: Decreased balance;Decreased knowledge of use of DME Barriers to Discharge: Decreased caregiver support Barriers to Discharge Comments: Pt's husband requires total care. Son assists with pts husband - may be able to assist pt PRN? PT Therapy Diagnosis : Difficulty walking;Abnormality of gait PT Plan PT Frequency: Min 3X/week PT Treatment/Interventions: Gait training;DME instruction;Balance training;Patient/family education;Therapeutic exercise PT Recommendation Recommendations for Other Services: OT consult Follow Up Recommendations: Home health PT for balance training Equipment Recommended: Other (comment) (? need for cane. Pt declined RW use and is unable to use with arm in sling. Will assess further.) PT Goals  Acute Rehab PT Goals PT Goal Formulation: With patient Time For Goal  Achievement: 7 days Pt will go Supine/Side to Sit: Independently;with HOB 0 degrees Pt will go Sit to Supine/Side: Independently;with HOB 0 degrees Pt will Transfer Sit to Stand/Stand to Sit: with supervision Pt will Ambulate: >150 feet;with least restrictive assistive device;with supervision Pt will Go Up / Down Stairs: 6-9 stairs;with least restrictive assistive device;with rail(s)  PT Evaluation Precautions/Restrictions  Precautions Precautions: Fall Prior Functioning  Home Living Lives With: Spouse (spouse is total assist) Receives Help From: Family (son helps with father. Pt does not do any lifting.) Type of Home: House Home Layout: Two level;Other (Comment) (husband lives on 1st level in hospital bed) Alternate Level Stairs-Rails: Right Alternate Level Stairs-Number of Steps: 1 flight to bedroom. 2 steps into den Home Access: Ramped entrance [employer] Toilet: Standard Home Adaptive Equipment:  (husband has equipment.) Additional Comments: walkers, 3-n-1 accessible for use. Husband uses wheelchair, hospital bed Prior Function Level of Independence: Independent with basic ADLs;Independent with homemaking with ambulation Driving: Yes Cognition Cognition Arousal/Alertness: Awake/alert Overall Cognitive Status: Appears within functional limits for tasks assessed Orientation Level: Oriented X4 Sensation/Coordination Sensation Light Touch: Appears Intact Extremity Assessment RLE Assessment RLE Assessment: Within Functional Limits LLE Assessment LLE Assessment: Within Functional Limits Mobility (including Balance) Bed Mobility Bed Mobility: Yes Supine to Sit: HOB elevated (Comment degrees);With rails;6: Modified independent (Device/Increase time) Transfers Transfers: Yes Sit to Stand: From bed;With upper extremity assist;Other (comment) (Min-guard assist) Stand to Sit: 5: Supervision;To chair/3-in-1;With upper extremity assist Ambulation/Gait Ambulation/Gait:  Yes Ambulation/Gait Assistance: 4: Min assist Ambulation/Gait Assistance Details (indicate cue type and reason): Intermittent episodes of LOB x 2 without challenges-required external assist to prevent fall. LOB with head turns-espically to R side. Pt very guarded during  ambulation with little to no armswing on right. Ambulation Distance (Feet): 200 Feet Assistive device: None Gait Pattern: Decreased step length - left;Decreased step length - right;Decreased weight shift to right;Decreased weight shift to left  Posture/Postural Control Posture/Postural Control: No significant limitations Balance Balance Assessed: Yes Static Standing Balance Static Standing - Balance Support: No upper extremity supported Static Standing - Comment/# of Minutes: 1-2 minutes total of Static standing with eyes open/closed- no LOB; narrow BOS static standing-no LOB. 360 degree turn to R/L - increased time, required close guarding. Exercise    End of Session PT - End of Session Equipment Utilized During Treatment: Gait belt Activity Tolerance: Patient limited by fatigue;Other (comment) (pt c/o back fatigue at end of session - requested rest.) Patient left: in chair;with call bell in reach General Behavior During Session: [facility] for tasks performed Cognition: [facility] for tasks performed  Rebeca Alert [facility] 03/17/2011, 10:39 AM

## 2011-03-17 NOTE — Telephone Encounter (Signed)
Per son Beth [name] she is in hospital at [facility]. She decided to stay after all.

## 2011-03-17 NOTE — Progress Notes (Signed)
  Echocardiogram 2D Echocardiogram has been performed.  Jeryl Columbia R 03/17/2011, 4:07 PM

## 2011-03-17 NOTE — Consult Note (Signed)
Reason for Consult:Frequent falls Referring Physician: Wandra Mannan  CC: Gait ataxia  HPI: Beth [name] is an 77 y.o. female who reports she began to fall at the beginning of the summer.  Had a significant fall at that time after working the yard.  Had another fall a few weeks ago and then a fall that led to this admission.  She reports that her balance problems are intermittent.  She can go for quite some time and have no problems.  She reports that when she does have problems it is when she is turning.  On reviewing the report from PT, they seem to confirm this.  She does not report any lightheadedness, vertigo or dizziness. Her falls have been significant with her incurring a fractured clavicle and wrist.  Past Medical History  Diagnosis Date  . Hypertension   . Hyperlipidemia   . Anxiety     situational  . Insomnia     Past Surgical History  Procedure Date  . Abdominal hysterectomy   . Bladder surgery     tac    Family History  Problem Relation Age of Onset  . Cancer Mother     breast  . Hyperlipidemia Mother   . Hypertension Mother   . Heart disease Father     MI  . Depression Father   . Hyperlipidemia Father   . Dementia Mother     Social History:  reports that she has quit smoking. She does not have any smokeless tobacco history on file. She reports that she does not drink alcohol or use illicit drugs.  Allergies  Allergen Reactions  . Pravastatin     Fatigued.   . Simvastatin Other (See Comments)    Myalgias and memory loss    Medications:  Prior to Admission:  Prescriptions prior to admission  Medication Sig Dispense Refill  . calcium carbonate 1250 MG capsule Take 1,250 mg by mouth 2 (two) times daily with a meal.        . Cholecalciferol (VITAMIN D3) 1000 UNITS tablet Take 1,000 Units by mouth daily.        . clorazepate (TRANXENE) 3.75 MG tablet TAKE ONE TABLET BY MOUTH AT BEDTIME  30 tablet  0  . fexofenadine (ALLEGRA) 180 MG tablet Take 180 mg by  mouth daily.        Marland Kitchen FLUoxetine (PROZAC) 40 MG capsule TAKE ONE CAPSULE BY MOUTH EVERY DAY  30 capsule  3  . guaiFENesin (MUCINEX) 600 MG 12 hr tablet Take 1,200 mg by mouth 2 (two) times daily.        Marland Kitchen imipramine (TOFRANIL) 50 MG tablet TAKE ONE TABLET BY MOUTH AT BEDTIME  30 tablet  1  . lisinopril (PRINIVIL,ZESTRIL) 5 MG tablet Take 1 tablet (5 mg total) by mouth daily.  30 tablet  6  . Multiple Vitamin (MULTIVITAMIN) tablet Take 1 tablet by mouth daily.        . Multiple Vitamins-Minerals (PRESERVISION/LUTEIN) CAPS Take by mouth.        Marland Kitchen omeprazole (PRILOSEC) 20 MG capsule Take 20 mg by mouth daily.        . Pyridoxine HCl (VITAMIN B-6) 500 MG tablet Take 500 mg by mouth daily.        Marland Kitchen sulfamethoxazole-trimethoprim (BACTRIM DS,SEPTRA DS) 800-160 MG per tablet Take 1 tablet by mouth 2 (two) times daily.  20 tablet  0  . vitamin B-12 (CYANOCOBALAMIN) 1000 MCG tablet Take 1,000 mcg by mouth daily.        Marland Kitchen  AMBULATORY NON FORMULARY MEDICATION Medication Name: zostavax im x 1  1 vial  0   Scheduled:   . aspirin EC  81 mg Oral Daily  . calcium carbonate  1,250 mg Oral BID WC  . cholecalciferol  1,000 Units Oral Daily  . clorazepate  3.75 mg Oral QHS  . enoxaparin  40 mg Subcutaneous Q24H  . FLUoxetine  40 mg Oral Daily  . imipramine  50 mg Oral QHS  . lisinopril  5 mg Oral Daily  . loratadine  10 mg Oral Daily  . multivitamins ther. w/minerals  1 tablet Oral Daily  . pantoprazole  40 mg Oral Q1200    ROS: History obtained from the patient  General ROS: negative for - chills, fatigue, fever, night sweats, weight gain or weight loss Psychological ROS: anxiety Ophthalmic ROS: wears corrective lens ENT ROS: negative for - epistaxis, nasal discharge, oral lesions, sore throat, tinnitus or vertigo Allergy and Immunology ROS: negative for - hives or itchy/watery eyes Hematological and Lymphatic ROS: negative for - bleeding problems, bruising or swollen lymph nodes Endocrine ROS:  negative for - galactorrhea, hair pattern changes, polydipsia/polyuria or temperature intolerance Respiratory ROS: negative for - cough, hemoptysis, shortness of breath or wheezing Cardiovascular ROS: negative for - chest pain, dyspnea on exertion, edema or irregular heartbeat Gastrointestinal ROS: negative for - abdominal pain, diarrhea, hematemesis, nausea/vomiting or stool incontinence Genito-Urinary ROS: negative for - dysuria, hematuria, incontinence or urinary frequency/urgency Musculoskeletal ROS: left arm in splint Neurological ROS: as noted in HPI Dermatological ROS: negative for rash and skin lesion changes  Blood pressure 114/69, pulse 79, temperature 97.5 F (36.4 C), temperature source Oral, resp. rate 18, height 5\' 4"  (1.626 m), weight 73.6 kg (162 lb 4.1 oz), SpO2 97.00%.  Neurologic Examination: Mental Status: Alert, oriented, thought content appropriate.  Speech fluent without evidence of aphasia.  Able to follow 3 step commands without difficulty. Cranial Nerves: II: visual fields grossly normal, pupils equal, round, reactive to light and accommodation III,IV, VI: ptosis not present, extraocular muscles extra-ocular motions intact bilaterally V,VII: smile symmetric, facial light touch sensation normal bilaterally VIII: hearing normal bilaterally IX,X: gag reflex present XI: trapezius strength/neck flexion strength normal bilaterally XII: tongue strength normal  Motor: Right : Upper extremity   5/5    Left:     Upper extremity   In splint but able to move left hand strongly  Lower extremity   5/5     Lower extremity   5/5 Tone and bulk:normal tone throughout; no atrophy noted Sensory: Pinprick and light touch intact throughout, bilaterally. Vibratory sensation intact i the lower extremities. Deep Tendon Reflexes: 2+ and symmetric throughout Plantars: Right: downgoing   Left: downgoing Cerebellar: normal finger-to-nose intact on the right. Left in splint. Normal  heel-to-shin test bilaterally    Results for orders placed during the hospital encounter of 03/16/11 (from the past 48 hour(s))  CBC     Status: Normal   Collection Time   03/16/11  4:00 AM      Component Value Range Comment   WBC 8.9  4.0 - 10.5 (K/uL)    RBC 4.09  3.87 - 5.11 (MIL/uL)    Hemoglobin 12.6  12.0 - 15.0 (g/dL)    HCT 56.2  13.0 - 86.5 (%)    MCV 90.0  78.0 - 100.0 (fL)    MCH 30.8  26.0 - 34.0 (pg)    MCHC 34.2  30.0 - 36.0 (g/dL)    RDW 78.4  69.6 -  15.5 (%)    Platelets 239  150 - 400 (K/uL)   URINALYSIS, ROUTINE W REFLEX MICROSCOPIC     Status: Normal   Collection Time   03/16/11  4:16 AM      Component Value Range Comment   Color, Urine YELLOW  YELLOW     Appearance CLEAR  CLEAR     Specific Gravity, Urine 1.016  1.005 - 1.030     pH 7.0  5.0 - 8.0     Glucose, UA NEGATIVE  NEGATIVE (mg/dL)    Hgb urine dipstick NEGATIVE  NEGATIVE     Bilirubin Urine NEGATIVE  NEGATIVE     Ketones, ur NEGATIVE  NEGATIVE (mg/dL)    Protein, ur NEGATIVE  NEGATIVE (mg/dL)    Urobilinogen, UA 0.2  0.0 - 1.0 (mg/dL)    Nitrite NEGATIVE  NEGATIVE     Leukocytes, UA NEGATIVE  NEGATIVE  MICROSCOPIC NOT DONE ON URINES WITH NEGATIVE PROTEIN, BLOOD, LEUKOCYTES, NITRITE, OR GLUCOSE <1000 mg/dL.  POCT I-STAT TROPONIN I     Status: Normal   Collection Time   03/16/11  4:23 AM      Component Value Range Comment   Troponin i, poc 0.00  0.00 - 0.08 (ng/mL)    Comment 3            POCT I-STAT, CHEM 8     Status: Abnormal   Collection Time   03/16/11  9:09 AM      Component Value Range Comment   Sodium 126 (*) 135 - 145 (mEq/L)    Potassium 4.0  3.5 - 5.1 (mEq/L)    Chloride 92 (*) 96 - 112 (mEq/L)    BUN 13  6 - 23 (mg/dL)    Creatinine, Ser 1.61  0.50 - 1.10 (mg/dL)    Glucose, Bld 096 (*) 70 - 99 (mg/dL)    Calcium, Ion 0.45  1.12 - 1.32 (mmol/L)    TCO2 25  0 - 100 (mmol/L)    Hemoglobin 13.9  12.0 - 15.0 (g/dL)    HCT 40.9  81.1 - 91.4 (%)   COMPREHENSIVE METABOLIC PANEL      Status: Abnormal   Collection Time   03/16/11  1:29 PM      Component Value Range Comment   Sodium 124 (*) 135 - 145 (mEq/L)    Potassium 4.1  3.5 - 5.1 (mEq/L)    Chloride 93 (*) 96 - 112 (mEq/L)    CO2 24  19 - 32 (mEq/L)    Glucose, Bld 97  70 - 99 (mg/dL)    BUN 10  6 - 23 (mg/dL)    Creatinine, Ser 7.82  0.50 - 1.10 (mg/dL)    Calcium 9.2  8.4 - 10.5 (mg/dL)    Total Protein 6.5  6.0 - 8.3 (g/dL)    Albumin 3.5  3.5 - 5.2 (g/dL)    AST 21  0 - 37 (U/L)    ALT 19  0 - 35 (U/L)    Alkaline Phosphatase 72  39 - 117 (U/L)    Total Bilirubin 0.4  0.3 - 1.2 (mg/dL)    GFR calc non Af Amer 84 (*) >90 (mL/min)    GFR calc Af Amer >90  >90 (mL/min)   CBC     Status: Abnormal   Collection Time   03/16/11  1:29 PM      Component Value Range Comment   WBC 7.7  4.0 - 10.5 (K/uL)    RBC 4.00  3.87 - 5.11 (MIL/uL)    Hemoglobin 12.2  12.0 - 15.0 (g/dL)    HCT 40.9 (*) 81.1 - 46.0 (%)    MCV 87.8  78.0 - 100.0 (fL)    MCH 30.5  26.0 - 34.0 (pg)    MCHC 34.8  30.0 - 36.0 (g/dL)    RDW 91.4  78.2 - 95.6 (%)    Platelets 238  150 - 400 (K/uL)   DIFFERENTIAL     Status: Normal   Collection Time   03/16/11  1:29 PM      Component Value Range Comment   Neutrophils Relative 71  43 - 77 (%)    Neutro Abs 5.5  1.7 - 7.7 (K/uL)    Lymphocytes Relative 17  12 - 46 (%)    Lymphs Abs 1.3  0.7 - 4.0 (K/uL)    Monocytes Relative 9  3 - 12 (%)    Monocytes Absolute 0.7  0.1 - 1.0 (K/uL)    Eosinophils Relative 3  0 - 5 (%)    Eosinophils Absolute 0.2  0.0 - 0.7 (K/uL)    Basophils Relative 1  0 - 1 (%)    Basophils Absolute 0.0  0.0 - 0.1 (K/uL)   TSH     Status: Normal   Collection Time   03/16/11  1:29 PM      Component Value Range Comment   TSH 2.170  0.350 - 4.500 (uIU/mL)   BASIC METABOLIC PANEL     Status: Abnormal   Collection Time   03/17/11  5:18 AM      Component Value Range Comment   Sodium 130 (*) 135 - 145 (mEq/L)    Potassium 4.0  3.5 - 5.1 (mEq/L)    Chloride 98  96 -  112 (mEq/L)    CO2 22  19 - 32 (mEq/L)    Glucose, Bld 147 (*) 70 - 99 (mg/dL)    BUN 11  6 - 23 (mg/dL)    Creatinine, Ser 2.13  0.50 - 1.10 (mg/dL)    Calcium 9.0  8.4 - 10.5 (mg/dL)    GFR calc non Af Amer 83 (*) >90 (mL/min)    GFR calc Af Amer >90  >90 (mL/min)   CBC     Status: Abnormal   Collection Time   03/17/11  5:18 AM      Component Value Range Comment   WBC 6.1  4.0 - 10.5 (K/uL)    RBC 3.79 (*) 3.87 - 5.11 (MIL/uL)    Hemoglobin 11.3 (*) 12.0 - 15.0 (g/dL)    HCT 08.6 (*) 57.8 - 46.0 (%)    MCV 91.0  78.0 - 100.0 (fL)    MCH 29.8  26.0 - 34.0 (pg)    MCHC 32.8  30.0 - 36.0 (g/dL)    RDW 46.9  62.9 - 52.8 (%)    Platelets 223  150 - 400 (K/uL)     Dg Chest 2 View  03/16/2011  *RADIOLOGY REPORT*  Clinical Data: Cough, congestion.  CHEST - 2 VIEW  Comparison: 05/03/2010  Findings: The patient is rotated.  Mild linear bibasilar opacities likely reflects scarring or atelectasis.  No focal areas of consolidation otherwise.  No pneumothorax.  No pleural effusion. Distal clavicle fracture again noted.  IMPRESSION: Linear lung base opacities likely reflect scarring or atelectasis. No focal consolidation otherwise.  Original Report Authenticated By: Waneta Martins, M.D.   Ct Head Wo Contrast  03/16/2011  *RADIOLOGY REPORT*  Clinical Data: Fall,  trauma.  CT HEAD WITHOUT CONTRAST  Technique:  Contiguous axial images were obtained from the base of the skull through the vertex without contrast.  Comparison: None.  Findings: Prominence of the sulci, cisterns, and ventricles, in keeping with volume loss. There are subcortical and periventricular white matter hypodensities, a nonspecific finding most often seen with chronic microangiopathic changes.  There is no evidence for acute hemorrhage, overt hydrocephalus, mass lesion, or abnormal extra-axial fluid collection.  No definite CT evidence for acute cortical based (large artery) infarction. Right posterior scalp hematoma no displaced  calvarial fracture. Right lens replacement. The visualized paranasal sinuses and mastoid air cells are predominately clear.  IMPRESSION: Right posterior scalp hematoma.  No underlying calvarial fracture.  White matter hypodensities are nonspecific however often secondary to chronic microangiopathic change.  No definite acute intracranial abnormality.  Original Report Authenticated By: Waneta Martins, M.D.   Mr Angiogram Head Wo Contrast  03/16/2011  *RADIOLOGY REPORT*  Clinical Data:  Ataxia  MRI HEAD WITHOUT CONTRAST MRA HEAD WITHOUT CONTRAST  Technique:  Multiplanar, multiecho pulse sequences of the brain and surrounding structures were obtained without intravenous contrast. Angiographic images of the head were obtained using MRA technique without contrast.  Comparison:  03/16/2011  MRI HEAD  Findings:  Negative for acute infarct.  Scattered white matter hyperintensities bilaterally compatible with chronic microvascular ischemia.  Mild chronic ischemia in the pons. Micro hemorrhage   right parietal white matter and left posterior temporal lobe, likely related to hypertensive bleed.  Negative for mass or edema.  IMPRESSION: Chronic microvascular ischemic change.  No acute infarct or mass.  MRA HEAD  Findings: Image quality degraded by motion.  Both vertebral arteries are patent.  Basilar and posterior cerebral arteries are patent.  Fetal origin of the posterior cerebral artery bilaterally.  Internal carotid artery is patent bilaterally without stenosis. Anterior and middle cerebral arteries are patent bilaterally. Probable moderate stenosis in the parietal branch of the left middle cerebral artery  Negative for aneurysm.  IMPRESSION: Image quality degraded by motion.  Probable stenosis in the parietal branch of the left middle cerebral artery.  Original Report Authenticated By: Camelia Phenes, M.D.   Mr Brain Wo Contrast  03/16/2011  *RADIOLOGY REPORT*  Clinical Data:  Ataxia  MRI HEAD WITHOUT CONTRAST  MRA HEAD WITHOUT CONTRAST  Technique:  Multiplanar, multiecho pulse sequences of the brain and surrounding structures were obtained without intravenous contrast. Angiographic images of the head were obtained using MRA technique without contrast.  Comparison:  03/16/2011  MRI HEAD  Findings:  Negative for acute infarct.  Scattered white matter hyperintensities bilaterally compatible with chronic microvascular ischemia.  Mild chronic ischemia in the pons. Micro hemorrhage   right parietal white matter and left posterior temporal lobe, likely related to hypertensive bleed.  Negative for mass or edema.  IMPRESSION: Chronic microvascular ischemic change.  No acute infarct or mass.  MRA HEAD  Findings: Image quality degraded by motion.  Both vertebral arteries are patent.  Basilar and posterior cerebral arteries are patent.  Fetal origin of the posterior cerebral artery bilaterally.  Internal carotid artery is patent bilaterally without stenosis. Anterior and middle cerebral arteries are patent bilaterally. Probable moderate stenosis in the parietal branch of the left middle cerebral artery  Negative for aneurysm.  IMPRESSION: Image quality degraded by motion.  Probable stenosis in the parietal branch of the left middle cerebral artery.  Original Report Authenticated By: Camelia Phenes, M.D.   Dg Shoulder Left  03/16/2011  *RADIOLOGY  REPORT*  Clinical Data: Anterior shoulder pain status post fall.  LEFT SHOULDER - 2+ VIEW  Comparison: None.  Findings: Distal clavicle fracture. Superior displacement of the proximal segment.  Acromioclavicular joint remains intact.Glenohumeral joint DJD.  No fracture or dislocation of the glenohumeral joint. Left lung interstitial prominence without focal consolidation.  IMPRESSION: Distal left clavicle fracture.  Original Report Authenticated By: Waneta Martins, M.D.     Assessment/Plan:  Patient Active Hospital Problem List: Frequent falls-difficulty with gait  (03/16/2011)   Assessment: Difficulty with gait does not seem static but intermittent and related to equilibrium.  She does have quite a bit of small vessel disease and it may very well be related to such.  Would like to rule out orthostasis and medication effect as well (would consider possible effect of Tofranil).   Plan: 1. Orthostatic BP with heart rate to be checked            2. Symptoms very positional-vestibular exercises may be helpful            3. Consider a trial off Tofranil. Unclear why the patient takes this medication.  May need psych input as to a substitution for the patient.             4. Continue ASA  Thana Farr, MD [facility] [phone] 03/17/2011, 4:15 PM

## 2011-03-18 LAB — CBC
HCT: 34.6 % — ABNORMAL LOW (ref 36.0–46.0)
MCHC: 33.8 g/dL (ref 30.0–36.0)
MCV: 90.6 fL (ref 78.0–100.0)
RDW: 13.9 % (ref 11.5–15.5)

## 2011-03-18 LAB — BASIC METABOLIC PANEL
BUN: 13 mg/dL (ref 6–23)
CO2: 26 mEq/L (ref 19–32)
Chloride: 98 mEq/L (ref 96–112)
Creatinine, Ser: 0.66 mg/dL (ref 0.50–1.10)
GFR calc Af Amer: >90 mL/min (ref >90)

## 2011-03-18 LAB — VITAMIN B12: Vitamin B-12: 1316 pg/mL — ABNORMAL HIGH (ref 211–911)

## 2011-03-18 MED ORDER — ASPIRIN 81 MG PO TBEC: 81.0000 mg | DELAYED_RELEASE_TABLET | Freq: Every day | ORAL | Status: AC

## 2011-03-18 MED ORDER — IMIPRAMINE HCL 25 MG PO TABS: 25.0000 mg | ORAL_TABLET | Freq: Every day | ORAL | Status: DC

## 2011-03-18 NOTE — Discharge Summary (Signed)
Physician Discharge Summary  Patient ID: Beth [name] MRN: [id number] DOB/AGE: Feb 05, 1933 77 y.o.  Admit date: 03/16/2011 Discharge date: 03/18/2011  Discharge Diagnoses:   Ataxia  HYPERCHOLESTEROLEMIA  HYPERTENSION, BENIGN  FIBROMYALGIA  URINARY URGENCY Fractured clavicle Orthostatic hypotension   Discharge Medication List as of 03/18/2011  2:30 PM    START taking these medications   Details  aspirin EC 81 MG EC tablet Take 1 tablet (81 mg total) by mouth daily., Starting 03/18/2011, Until Wed 03/17/12, Normal    HYDROcodone-acetaminophen (VICODIN) 5-500 MG per tablet Take 1 tablet by mouth every 6 (six) hours as needed for pain., Starting 03/16/2011, Until Wed 03/26/11, Print      CONTINUE these medications which have CHANGED   Details  imipramine (TOFRANIL) 25 MG tablet Take 1 tablet (25 mg total) by mouth at bedtime., Starting 03/18/2011, Until Discontinued, Print      CONTINUE these medications which have NOT CHANGED   Details  calcium carbonate 1250 MG capsule Take 1,250 mg by mouth 2 (two) times daily with a meal.  , Until Discontinued, Historical Med    Cholecalciferol (VITAMIN D3) 1000 UNITS tablet Take 1,000 Units by mouth daily.  , Until Discontinued, Historical Med    clorazepate (TRANXENE) 3.75 MG tablet TAKE ONE TABLET BY MOUTH AT BEDTIME, Print    fexofenadine (ALLEGRA) 180 MG tablet Take 180 mg by mouth daily.  , Until Discontinued, Historical Med    FLUoxetine (PROZAC) 40 MG capsule TAKE ONE CAPSULE BY MOUTH EVERY DAY, Normal    guaiFENesin (MUCINEX) 600 MG 12 hr tablet Take 1,200 mg by mouth 2 (two) times daily.  , Until Discontinued, Historical Med    lisinopril (PRINIVIL,ZESTRIL) 5 MG tablet Take 1 tablet (5 mg total) by mouth daily., Starting 02/18/2011, Until Discontinued, Print    Multiple Vitamin (MULTIVITAMIN) tablet Take 1 tablet by mouth daily.  , Until Discontinued, Historical Med    Multiple Vitamins-Minerals (PRESERVISION/LUTEIN) CAPS  Take by mouth.  , Until Discontinued, Historical Med    omeprazole (PRILOSEC) 20 MG capsule Take 20 mg by mouth daily.  , Until Discontinued, Historical Med    Pyridoxine HCl (VITAMIN B-6) 500 MG tablet Take 500 mg by mouth daily.  , Until Discontinued, Historical Med    vitamin B-12 (CYANOCOBALAMIN) 1000 MCG tablet Take 1,000 mcg by mouth daily.  , Until Discontinued, Historical Med    AMBULATORY NON FORMULARY MEDICATION Medication Name: zostavax im x 1, Print      STOP taking these medications     sulfamethoxazole-trimethoprim (BACTRIM DS,SEPTRA DS) 800-160 MG per tablet         Discharge Orders    Future Orders Please Complete By Expires   Ambulatory referral to Home Health      Comments:   Please evaluate Beth [name] for admission to [facility].  Disciplines requested: PT/OT Services to provide:PT/OT  Physician to follow patient's care Primary Care Doctor  Requested Start of Care Date: Next available  Special Instructions: None    Diet - low sodium heart healthy      Increase activity slowly         Follow-up Information    Follow up with [facility], Claude Manges, MD in 2 days.   Contact information:   [address] [place] [address] [phone]       Follow up with METHENEY,CATHERINE, MD in 1 week.   Contact information:   [address]  [address]  [place]  [phone]          Disposition: Home or Self Care  Discharged Condition: Stable No Order   Consults: Treatment Team:  Kym Groom  HPI:  Beth [name] is an 77 y.o. female with past medical history significant for hypertension and hyperlipidemia. Patient stated that for the past 3 weeks she has been off balance. She has to walk towards th side. She has also fallen 3 times over the past 3 weeks. The last time she fell she fractured her clavicle. She also stated that for the past 4 weeks she feels as if she has a respiratory infection  she saw her primary care physician who started her on Bactrim. She was also diagnosed with high cholesterol and started on statin which she could not tolerate and the statin was discontinued. She's had no chest pain no shortness of breath no problems swallowing no focal deficits,no difficulty speaking. She does complain of a lot of stress. 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 rate 18, height 5\' 4"  (1.626 m), weight 73.6 kg (162 lb 4.1 oz), SpO2 92.00%. Physical exam HEENT: normal  Cardio: RRR  Resp: CTA B/L  GI: BS positive  Extremity: No Edema     Hospital Course:  Ataxia The patient was evaluated by physical therapy and was found to have some ataxia. Neurology was consult and thought maybe part of her ataxia and gait problem was secondary to orthostatic hypotension. Orthostatic vitals were checked and patient indeed has orthostatic blood pressures. One of the medicines that she is on Imipramine can cause orthostatic blood pressure.Decreased the dose to 25 mg at bedtime patient will followup with her doctor to wean her off that medication and possibly try something else, she's been on that medication for a long time. Will discharge patient with home physical therapy and occupational therapy. She had MRI MRA that was not conclusive 2-D echo also negative, carotid Dopplers done recently that  showed less than 50% stenosis her labs were within normal limits.   HYPERCHOLESTEROLEMIA Patient states that she is intolerant to statin medications will continue diet and exercise.   HYPERTENSION, BENIGN Patient was continued on her home antihypertensive medications.   FIBROMYALGIA Patient states that she is on imipramine for  fibromyalgia. Her dose was decreased and she will followup with her PCP for further management.  URINARY URGENCY This is a chronic problem. Patient was told to follow up with her urologist.  Fractured clavicle Followup outpatient with orthopedics as stated above.  Orthostatic hypotension Patient was noted to be orthostatic. She was told to wear Job stocking and decreased the dose of her Imipramine.   Labs:   Results for orders placed during the hospital encounter of 03/16/11 (from the past 48 hour(s))  BASIC METABOLIC PANEL     Status: Abnormal   Collection Time   03/17/11  5:18 AM      Component Value Range Comment   Sodium 130 (*) 135 - 145 (mEq/L)    Potassium 4.0  3.5 - 5.1 (mEq/L)    Chloride 98  96 - 112 (mEq/L)    CO2 22  19 - 32 (mEq/L)  Glucose, Bld 147 (*) 70 - 99 (mg/dL)    BUN 11  6 - 23 (mg/dL)    Creatinine, Ser 1.61  0.50 - 1.10 (mg/dL)    Calcium 9.0  8.4 - 10.5 (mg/dL)    GFR calc non Af Amer 83 (*) >90 (mL/min)    GFR calc Af Amer >90  >90 (mL/min)   CBC     Status: Abnormal   Collection Time   03/17/11  5:18 AM      Component Value Range Comment   WBC 6.1  4.0 - 10.5 (K/uL)    RBC 3.79 (*) 3.87 - 5.11 (MIL/uL)    Hemoglobin 11.3 (*) 12.0 - 15.0 (g/dL)    HCT 09.6 (*) 04.5 - 46.0 (%)    MCV 91.0  78.0 - 100.0 (fL)    MCH 29.8  26.0 - 34.0 (pg)    MCHC 32.8  30.0 - 36.0 (g/dL)    RDW 40.9  81.1 - 91.4 (%)    Platelets 223  150 - 400 (K/uL)   VITAMIN B12     Status: Abnormal   Collection Time   03/17/11  4:37 PM      Component Value Range Comment   Vitamin B-12 1316 (*) 211 - 911 (pg/mL)   SEDIMENTATION RATE     Status: Abnormal   Collection Time   03/17/11  4:37 PM      Component Value Range Comment   Sed Rate 43 (*) 0 - 22 (mm/hr)   BASIC METABOLIC PANEL     Status: Abnormal   Collection Time   03/18/11  5:14 AM      Component Value Range Comment   Sodium 130 (*) 135 - 145 (mEq/L)    Potassium 4.2  3.5 - 5.1 (mEq/L)    Chloride 98  96 - 112  (mEq/L)    CO2 26  19 - 32 (mEq/L)    Glucose, Bld 105 (*) 70 - 99 (mg/dL)    BUN 13  6 - 23 (mg/dL)    Creatinine, Ser 7.82  0.50 - 1.10 (mg/dL)    Calcium 8.9  8.4 - 10.5 (mg/dL)    GFR calc non Af Amer 84 (*) >90 (mL/min)    GFR calc Af Amer >90  >90 (mL/min)   CBC     Status: Abnormal   Collection Time   03/18/11  5:14 AM      Component Value Range Comment   WBC 6.6  4.0 - 10.5 (K/uL)    RBC 3.82 (*) 3.87 - 5.11 (MIL/uL)    Hemoglobin 11.7 (*) 12.0 - 15.0 (g/dL)    HCT 95.6 (*) 21.3 - 46.0 (%)    MCV 90.6  78.0 - 100.0 (fL)    MCH 30.6  26.0 - 34.0 (pg)    MCHC 33.8  30.0 - 36.0 (g/dL)    RDW 08.6  57.8 - 46.9 (%)    Platelets 200  150 - 400 (K/uL)     Diagnostics:  Dg Chest 2 View  03/16/2011  *RADIOLOGY REPORT*  Clinical Data: Cough, congestion.  CHEST - 2 VIEW  Comparison: 05/03/2010  Findings: The patient is rotated.  Mild linear bibasilar opacities likely reflects scarring or atelectasis.  No focal areas of consolidation otherwise.  No pneumothorax.  No pleural effusion. Distal clavicle fracture again noted.  IMPRESSION: Linear lung base opacities likely reflect scarring or atelectasis. No focal consolidation otherwise.  Original Report Authenticated By: Waneta Martins, M.D.   Ct Head  Wo Contrast  03/16/2011  *RADIOLOGY REPORT*  Clinical Data: Fall, trauma.  CT HEAD WITHOUT CONTRAST  Technique:  Contiguous axial images were obtained from the base of the skull through the vertex without contrast.  Comparison: None.  Findings: Prominence of the sulci, cisterns, and ventricles, in keeping with volume loss. There are subcortical and periventricular white matter hypodensities, a nonspecific finding most often seen with chronic microangiopathic changes.  There is no evidence for acute hemorrhage, overt hydrocephalus, mass lesion, or abnormal extra-axial fluid collection.  No definite CT evidence for acute cortical based (large artery) infarction. Right posterior scalp hematoma no  displaced calvarial fracture. Right lens replacement. The visualized paranasal sinuses and mastoid air cells are predominately clear.  IMPRESSION: Right posterior scalp hematoma.  No underlying calvarial fracture.  White matter hypodensities are nonspecific however often secondary to chronic microangiopathic change.  No definite acute intracranial abnormality.  Original Report Authenticated By: Waneta Martins, M.D.   Mr Angiogram Head Wo Contrast  03/16/2011  *RADIOLOGY REPORT*  Clinical Data:  Ataxia  MRI HEAD WITHOUT CONTRAST MRA HEAD WITHOUT CONTRAST  Technique:  Multiplanar, multiecho pulse sequences of the brain and surrounding structures were obtained without intravenous contrast. Angiographic images of the head were obtained using MRA technique without contrast.  Comparison:  03/16/2011  MRI HEAD  Findings:  Negative for acute infarct.  Scattered white matter hyperintensities bilaterally compatible with chronic microvascular ischemia.  Mild chronic ischemia in the pons. Micro hemorrhage   right parietal white matter and left posterior temporal lobe, likely related to hypertensive bleed.  Negative for mass or edema.  IMPRESSION: Chronic microvascular ischemic change.  No acute infarct or mass.  MRA HEAD  Findings: Image quality degraded by motion.  Both vertebral arteries are patent.  Basilar and posterior cerebral arteries are patent.  Fetal origin of the posterior cerebral artery bilaterally.  Internal carotid artery is patent bilaterally without stenosis. Anterior and middle cerebral arteries are patent bilaterally. Probable moderate stenosis in the parietal branch of the left middle cerebral artery  Negative for aneurysm.  IMPRESSION: Image quality degraded by motion.  Probable stenosis in the parietal branch of the left middle cerebral artery.  Original Report Authenticated By: Camelia Phenes, M.D.   Mr Brain Wo Contrast  03/16/2011  *RADIOLOGY REPORT*  Clinical Data:  Ataxia  MRI HEAD WITHOUT  CONTRAST MRA HEAD WITHOUT CONTRAST  Technique:  Multiplanar, multiecho pulse sequences of the brain and surrounding structures were obtained without intravenous contrast. Angiographic images of the head were obtained using MRA technique without contrast.  Comparison:  03/16/2011  MRI HEAD  Findings:  Negative for acute infarct.  Scattered white matter hyperintensities bilaterally compatible with chronic microvascular ischemia.  Mild chronic ischemia in the pons. Micro hemorrhage   right parietal white matter and left posterior temporal lobe, likely related to hypertensive bleed.  Negative for mass or edema.  IMPRESSION: Chronic microvascular ischemic change.  No acute infarct or mass.  MRA HEAD  Findings: Image quality degraded by motion.  Both vertebral arteries are patent.  Basilar and posterior cerebral arteries are patent.  Fetal origin of the posterior cerebral artery bilaterally.  Internal carotid artery is patent bilaterally without stenosis. Anterior and middle cerebral arteries are patent bilaterally. Probable moderate stenosis in the parietal branch of the left middle cerebral artery  Negative for aneurysm.  IMPRESSION: Image quality degraded by motion.  Probable stenosis in the parietal branch of the left middle cerebral artery.  Original Report Authenticated By: DAVID C.  Chestine Spore, M.D.   Dg Shoulder Left  03/16/2011  *RADIOLOGY REPORT*  Clinical Data: Anterior shoulder pain status post fall.  LEFT SHOULDER - 2+ VIEW  Comparison: None.  Findings: Distal clavicle fracture. Superior displacement of the proximal segment.  Acromioclavicular joint remains intact.Glenohumeral joint DJD.  No fracture or dislocation of the glenohumeral joint. Left lung interstitial prominence without focal consolidation.  IMPRESSION: Distal left clavicle fracture.  Original Report Authenticated By: Waneta Martins, M.D.      SignedEarlene Plater MD, Ladell Pier 03/18/2011, 3:52 PM

## 2011-03-18 NOTE — Plan of Care (Signed)
Problem: Phase I Progression Outcomes Goal: Initial discharge plan identified Outcome: Completed/Met Date Met:  03/18/11 Plans to go home

## 2011-03-18 NOTE — Progress Notes (Signed)
TALKED TO PATIENT ABOUT DCP/ HHC; PATIENT REQUESTED [facility] FOR HHC SERVICES; DEBBIE BARNES WITH [facility] CALLED FOR ARRANGEMENTS; B. Carold Eisner RN, BSN, MHA

## 2011-03-18 NOTE — Progress Notes (Signed)
Occupational Therapy Treatment Patient Details Name: Beth [name] MRN: [id number] DOB: 1933/03/03 Today's Date: 03/18/[phone] [place]  OT Assessment/Plan OT Assessment/Plan OT Frequency: Min 2X/week Follow Up Recommendations: Home health OT Equipment Recommended: None recommended by OT OT Goals ADL Goals ADL Goal: Grooming - Progress: Met ADL Goal: Lower Body Bathing - Progress: Progressing toward goals ADL Goal: Lower Body Dressing - Progress: Progressing toward goals ADL Goal: Toilet Transfer - Progress: Progressing toward goals ADL Goal: Toileting - Clothing Manipulation - Progress: Progressing toward goals ADL Goal: Toileting - Hygiene - Progress: Met Miscellaneous OT Goals OT Goal: Miscellaneous Goal #1 - Progress: Not addressed  OT Treatment Precautions/Restrictions  Precautions Precautions: Fall Restrictions Weight Bearing Restrictions: No   ADL ADL Grooming: Performed;Teeth care;Supervision/safety Where Assessed - Grooming: Standing at sink Lower Body Bathing: Performed;Minimal assistance Where Assessed - Lower Body Bathing: Sit to stand from chair;Other (comment) (high commode) Lower Body Dressing: Performed;Minimal assistance;Other (comment) (underwear only) Toilet Transfer: Performed;Minimal assistance;Other (comment) (min guard high commode using single point cane) Toileting - Clothing Manipulation: Performed;Minimal assistance Where Assessed - Glass blower/designer Manipulation: Standing Toileting - Hygiene: Performed;Supervision/safety Where Assessed - Toileting Hygiene: Standing ADL Comments: educated pt on washing under LUE using seesaw method Mobility  Transfers Sit to Stand: 5: Supervision Exercises    End of Session OT - End of Session Activity Tolerance: Patient tolerated treatment well Patient left: in chair;with call bell in reach;Other (comment) (rn aware) General Behavior During Session: [name] for tasks performed Cognition: [facility] for tasks  performed  Beth [name]  [phone] 03/18/2011, 9:25 AM

## 2011-03-18 NOTE — Progress Notes (Signed)
Subjective: Patient has had no further syncopal events.  Orthostatics checked and patient is definitely orthostatic.  This may very well be contributing to her falls.  Again this points to medications as her etiology of her falls.  Objective: Vital signs in last 24 hours: Temp:  [97.5 F (36.4 C)-98.7 F (37.1 C)] 97.7 F (36.5 C) (11/20 0841) Pulse Rate:  [78-87] 82  (11/20 0848) Resp:  [18-20] 18  (11/20 0848) BP: (107-146)/(70-79) 107/70 mmHg (11/20 0848) SpO2:  [92 %-96 %] 92 % (11/20 0848)  Intake/Output from previous day: 11/19 0701 - 11/20 0700 In: 1270 [P.O.:720; I.V.:550] Out: 950 [Urine:950] Intake/Output this shift: Total I/O In: 840 [P.O.:240; I.V.:600] Out: -  Nutritional status: Cardiac  Neurologic Exam: Mental Status:  Alert, oriented, thought content appropriate. Speech fluent without evidence of aphasia. Able to follow 3 step commands without difficulty.  Cranial Nerves:  II: visual fields grossly normal, pupils equal, round, reactive to light and accommodation  III,IV, VI: ptosis not present, extraocular muscles extra-ocular motions intact bilaterally  V,VII: smile symmetric, facial light touch sensation normal bilaterally  VIII: hearing normal bilaterally  IX,X: gag reflex present  XI: trapezius strength/neck flexion strength normal bilaterally  XII: tongue strength normal  Motor:  Right : Upper extremity 5/5 Left: Upper extremity In splint but able to move left hand strongly  Lower extremity 5/5 Lower extremity 5/5  Tone and bulk:normal tone throughout; no atrophy noted  Sensory: Pinprick and light touch intact throughout, bilaterally. Vibratory sensation intact i the lower extremities.  Deep Tendon Reflexes: 2+ and symmetric throughout  Plantars:  Right: downgoing Left: downgoing  Cerebellar:  normal finger-to-nose intact on the right. Left in splint. Normal heel-to-shin test bilaterally  Lab Results:  Basename 03/18/11 0514 03/17/11 0518  WBC 6.6  6.1  HGB 11.7* 11.3*  HCT 34.6* 34.5*  PLT 200 223  NA 130* 130*  K 4.2 4.0  CL 98 98  CO2 26 22  GLUCOSE 105* 147*  BUN 13 11  CREATININE 0.66 0.68  CALCIUM 8.9 9.0  LABA1C -- --   Studies/Results: No results found.  Medications:  I have reviewed the patient's current medications. Scheduled:   . aspirin EC  81 mg Oral Daily  . calcium carbonate  1,250 mg Oral BID WC  . cholecalciferol  1,000 Units Oral Daily  . clorazepate  3.75 mg Oral QHS  . enoxaparin  40 mg Subcutaneous Q24H  . FLUoxetine  40 mg Oral Daily  . imipramine  50 mg Oral QHS  . lisinopril  5 mg Oral Daily  . loratadine  10 mg Oral Daily  . multivitamins ther. w/minerals  1 tablet Oral Daily  . pantoprazole  40 mg Oral Q1200    Assessment/Plan:  Patient Active Hospital Problem List: Ataxia (03/16/2011)   Assessment: Patient's gait problems and falls likely secondary to orthostasis   Plan: Medications to be addressed.  No further neurologic intervention is recommended at this time.  If further questions arise, please call or page at that time.  Thank you for allowing neurology to participate in the care of this patient.  Thana Farr, MD [facility] [phone] 03/18/2011  4:08 PM     LOS: 2 days   Thana Farr, MD [facility] [phone] 03/18/2011  4:02 PM

## 2011-03-23 ENCOUNTER — Other Ambulatory Visit: Payer: Self-pay | Admitting: Family Medicine

## 2011-03-28 ENCOUNTER — Encounter: Payer: Self-pay | Admitting: Family Medicine

## 2011-03-28 ENCOUNTER — Ambulatory Visit (INDEPENDENT_AMBULATORY_CARE_PROVIDER_SITE_OTHER): Payer: Medicare HMO | Admitting: Family Medicine

## 2011-03-28 DIAGNOSIS — R059 Cough, unspecified: Secondary | ICD-10-CM

## 2011-03-28 DIAGNOSIS — Z9181 History of falling: Secondary | ICD-10-CM

## 2011-03-28 DIAGNOSIS — R42 Dizziness and giddiness: Secondary | ICD-10-CM

## 2011-03-28 DIAGNOSIS — R05 Cough: Secondary | ICD-10-CM

## 2011-03-28 DIAGNOSIS — R296 Repeated falls: Secondary | ICD-10-CM

## 2011-03-28 DIAGNOSIS — S42209A Unspecified fracture of upper end of unspecified humerus, initial encounter for closed fracture: Secondary | ICD-10-CM

## 2011-03-28 DIAGNOSIS — S4292XA Fracture of left shoulder girdle, part unspecified, initial encounter for closed fracture: Secondary | ICD-10-CM

## 2011-03-28 NOTE — Patient Instructions (Signed)
Can use mucinex. Call if cough is getting worse or not better in 1-2 weeks.   Stop the lisinopril.

## 2011-03-28 NOTE — Progress Notes (Signed)
  Subjective:    Patient ID: AERICA [name], female    DOB: 11/15/1932, 77 y.o.   MRN: [id number]  HPI Got up out of chair and fell backwards into the TV. Hit head on both sides and broke her left shoulder.  They felt she may have had ministrokes since has been falling frequently.  They felt BP was dropping about 30 points when he would standup. Dec her imipramine to 25mg . as adjusted some of her other blood pressure medications. She is still taking lisinopril 5 mg daily. Larey Seat down the stairs at home the day she got home from the hospital. She says she didn't really hurt herself. She is getting home PT and OT.  The OT says didn't need any further tx. She has dec sensation on the right foot. She feels dizzy this morning. She is also supposed to wearing her compression stockings.   I did review the hospital notes. Review of Systems     Objective:   Physical Exam  Constitutional: She is oriented to person, place, and time. She appears well-developed and well-nourished.       She is wearing a left shoulder sling.  HENT:  Head: Normocephalic and atraumatic.  Right Ear: External ear normal.  Left Ear: External ear normal.  Nose: Nose normal.  Mouth/Throat: Oropharynx is clear and moist. No oropharyngeal exudate.       She does have a small defect on her head, right couple inches from her right ear. No tears or breaks in the skin.  Eyes: Conjunctivae are normal. Pupils are equal, round, and reactive to light.  Neck: Neck supple. No thyromegaly present.  Cardiovascular: Normal rate, regular rhythm and normal heart sounds.   Pulmonary/Chest: Effort normal.       She has some rhonchi at the bases bilaterally that clear with cough.  Lymphadenopathy:    She has no cervical adenopathy.  Neurological: She is alert and oriented to person, place, and time.  Skin: Skin is warm and dry.  Psychiatric: She has a normal mood and affect. Her behavior is normal.          Assessment & Plan:  Dizziness-it  sounds like most of her symptoms were from orthostatic episodes. At this point, I will stop her lisinopril completely. Her blood pressure looks fantastic and for a 77 year old it is possible that she may need some slightly increased perfusion. The lisinopril also could be exacerbating her cough. They also decreased her amitriptyline and her foot as well as well. I also discussed having her consider stopping her Allegra as well and this could be contributing to dizziness.  Cough-likely viral bronchitis. I explained that it can take several weeks to get completely better. She is not short of breath has not run any fevers. Otherwise she is doing well. If her symptoms worsen or do not improve in the next couple weeks  please call the office. If she spikes a fever that she needs to call the office. Also we'll stop the lisinopril and see if this helps improve her cough as well.  Falls-likely related to her dizziness and orthostatic episodes. She is getting physical therapy at home so hopefully this will be helpful. Her son is with her for her office visit today.  Left shoulder fracture-she has a followup with the orthopedist in a couple weeks.

## 2011-03-29 ENCOUNTER — Telehealth: Payer: Self-pay | Admitting: *Deleted

## 2011-03-29 MED ORDER — ALBUTEROL SULFATE HFA 108 (90 BASE) MCG/ACT IN AERS: 2.0000 | INHALATION_SPRAY | RESPIRATORY_TRACT | Status: DC | PRN

## 2011-03-29 NOTE — Telephone Encounter (Signed)
Spoke w/call a nurse, Lupita Leash [phone] - pt called stating that MD advised her that inhaler would be called in at [facility] yesterday. I reviewed [facility] notes, there is no mention of inhaler. Please advise and I will call patient back.

## 2011-03-29 NOTE — Telephone Encounter (Signed)
Call in Proair 2 puffs q 4 hours prn sob, #1 with no rf

## 2011-03-29 NOTE — Telephone Encounter (Signed)
Patient informed. 

## 2011-03-29 NOTE — Telephone Encounter (Signed)
Spoke w/call a nurse - pt called stating that MD advised her that inhaler would be called in at [facility] yesterday. I reviewed [facility] notes, there is no mention of inhaler. Please advise and I will call patient back.

## 2011-03-31 ENCOUNTER — Telehealth: Payer: Self-pay | Admitting: *Deleted

## 2011-03-31 NOTE — Progress Notes (Signed)
Summary: COUGH,SORE THROAT; RUNNY NOSE (room 5)   Vital Signs:  Patient Profile:   77 Years Old Female CC:      URI S&s x 5 days Height:     63.5 inches Weight:      153.50 pounds O2 Sat:      97 % O2 treatment:    Room Air Temp:     98.3 degrees F oral Pulse rate:   88 / minute Resp:     16 per minute BP sitting:   123 / 74  Vitals Entered By: Lavell Islam RN (November 13, 2010 2:51 PM)                  Updated Prior Medication List: IMIPRAMINE HCL 50 MG TABS (IMIPRAMINE HCL) Take 1 tablet by mouth once a day at bedtime CALCIUM CARBONATE 1250 MG TABS (CALCIUM CARBONATE) Take one tablet by mouth once a day MULTIVITAMINS  TABS (MULTIPLE VITAMIN) Take one tablet by mouth once a day VITAMIN D 1000 UNIT TABS (CHOLECALCIFEROL) Take one tabalet by mouth 9 times a day PRESERVISION/LUTEIN  CAPS (MULTIPLE VITAMINS-MINERALS) Take one tablet by mouth twice a day VITAMIN B-6 500 MG TABS (PYRIDOXINE HCL) take one tablet by mouth once a day VITAMIN B-12 1000 MCG TABS (CYANOCOBALAMIN) take one tablet by mouth once a day * CHOLESTEROL MED. ? DOSE daily, just started  Current Allergies: ! * ENVIRONMENTALHistory of Present Illness History from: patient Chief Complaint: URI S&s x 5 days History of Present Illness: 77 Years Old Female complains of onset of cold symptoms for 5 days.  Aviannah has been using Mucinex which is helping a little bit. + sore throat + cough No pleuritic pain No wheezing + nasal congestion + post-nasal drainage No sinus pain/pressure + chest congestion No itchy/red eyes No earache No hemoptysis No SOB No chills/sweats No fever No nausea No vomiting No abdominal pain No diarrhea No skin rashes No fatigue No myalgias + headache   REVIEW OF SYSTEMS Constitutional Symptoms      Denies fever, chills, night sweats, weight loss, weight gain, and fatigue.  Eyes       Complains of eye surgery.      Denies change in vision, eye pain, eye discharge, glasses, and  contact lenses.      Comments: for macular degeneration Ear/Nose/Throat/Mouth       Complains of frequent runny nose, sinus problems, and sore throat.      Denies hearing loss/aids, change in hearing, ear pain, ear discharge, dizziness, frequent nose bleeds, hoarseness, and tooth pain or bleeding.  Respiratory       Complains of productive cough and wheezing.      Denies dry cough, shortness of breath, asthma, bronchitis, and emphysema/COPD.      Comments: greenish Cardiovascular       Denies murmurs, chest pain, and tires easily with exhertion.    Gastrointestinal       Denies stomach pain, nausea/vomiting, diarrhea, constipation, blood in bowel movements, and indigestion. Genitourniary       Denies painful urination, kidney stones, and loss of urinary control. Neurological       Complains of headaches.      Denies paralysis, seizures, and fainting/blackouts. Musculoskeletal       Denies muscle pain, joint pain, joint stiffness, decreased range of motion, redness, swelling, muscle weakness, and gout.  Skin       Denies bruising, unusual mles/lumps or sores, and hair/skin or nail changes.  Psych  Denies mood changes, temper/anger issues, anxiety/stress, speech problems, depression, and sleep problems. Other Comments: URI S&S x 5 days   Past History:  Family History: Last updated: 12/06/2008 Mother with BrCa, hi chol, HTN FAther with MI, depression, DM, hi chol  Social History: Last updated: 02/07/2009 Married to [employer].  Her husband has many chronic illnesses. HS degree. Has a daughter and son. Son is disabled.  Former Smoker, quit 1954 Alcohol use-no Drug use-no Regular exercise-no  Past Medical History: HTN, high cholesterol Bronchietasis Hx of situational anxiety Insomnia - In the past tried doxepin and didn't work.  Hyperlipidemia  Past Surgical History: Reviewed history from 05/11/2009 and no changes required. Badder surgery, tac Hysterectomy   Family  History: Reviewed history from 12/06/2008 and no changes required. Mother with BrCa, hi chol, HTN FAther with MI, depression, DM, hi chol  Social History: Reviewed history from 02/07/2009 and no changes required. Married to [employer].  Her husband has many chronic illnesses. HS degree. Has a daughter and son. Son is disabled.  Former Smoker, quit 1954 Alcohol use-no Drug use-no Regular exercise-no Physical Exam General appearance: well developed, well nourished, no acute distress Ears: normal, no lesions or deformities Nasal: clear discharge Oral/Pharynx: mild erythema, OP paten, no exudates Chest/Lungs: no rales, wheezes, or rhonchi bilateral, breath sounds equal without effort Heart: regular rate and  rhythm, no murmur MSE: oriented to time, place, and person Assessment New Problems: HYPERLIPIDEMIA (ICD-272.4)   Patient Education: Patient and/or caregiver instructed in the following: rest, fluids.  Plan New Medications/Changes: ZITHROMAX Z-PAK 250 MG TABS (AZITHROMYCIN) use as directed  #1 x 0, 11/13/2010, Hoyt Koch MD  New Orders: New Patient Level III [phone] Pulse Oximetry (single measurment) [94760] Planning Comments:   1)  Take the prescribed antibiotic as instructed. 2)  Use nasal saline solution (over the counter) at least 3 times a day. 3)  Use over the counter decongestants like Zyrtec-D every 12 hours as needed to help with congestion. 4)  Can take tylenol every 6 hours or motrin every 8 hours for pain or fever. 5)  Follow up with your primary doctor  if no improvement in 5-7 days, sooner if increasing pain, fever, or new symptoms.    The patient and/or caregiver has been counseled thoroughly with regard to medications prescribed including dosage, schedule, interactions, rationale for use, and possible side effects and they verbalize understanding.  Diagnoses and expected course of recovery discussed and will return if not improved as expected or if  the condition worsens. Patient and/or caregiver verbalized understanding.  Prescriptions: ZITHROMAX Z-PAK 250 MG TABS (AZITHROMYCIN) use as directed  #1 x 0   Entered and Authorized by:   Hoyt Koch MD   Signed by:   Hoyt Koch MD on 11/13/2010   Method used:   Print then Give to Patient   RxID:   [id number]   Orders Added: 1)  New Patient Level III [95621] 2)  Pulse Oximetry (single measurment) [30865]

## 2011-03-31 NOTE — Progress Notes (Signed)
Summary: DRY COUGH...WSE (rm 3)   Vital Signs:  Patient Profile:   77 Years Old Female CC:      dry cough and fatigue x 1 week Height:     63.5 inches Weight:      157 pounds O2 Sat:      98 % O2 treatment:    Room Air Temp:     98.4 degrees F oral Pulse rate:   86 / minute Resp:     14 per minute BP sitting:   131 / 82  (left arm) Cuff size:   regular  Vitals Entered By: Lajean Saver RN (February 06, 2011 3:56 PM)                  Updated Prior Medication List: IMIPRAMINE HCL 50 MG TABS (IMIPRAMINE HCL) Take 1 tablet by mouth once a day at bedtime CALCIUM CARBONATE 1250 MG TABS (CALCIUM CARBONATE) Take one tablet by mouth once a day MULTIVITAMINS  TABS (MULTIPLE VITAMIN) Take one tablet by mouth once a day VITAMIN D 1000 UNIT TABS (CHOLECALCIFEROL) Take one tabalet by mouth 9 times a day PRESERVISION/LUTEIN  CAPS (MULTIPLE VITAMINS-MINERALS) Take one tablet by mouth twice a day VITAMIN B-6 500 MG TABS (PYRIDOXINE HCL) take one tablet by mouth once a day VITAMIN B-12 1000 MCG TABS (CYANOCOBALAMIN) take one tablet by mouth once a day SIMVASTATIN 10 MG TABS (SIMVASTATIN)   Current Allergies (reviewed today): ! * ENVIRONMENTALHistory of Present Illness History from: patient Chief Complaint: dry cough and fatigue x 1 week History of Present Illness: Pt complains of dry cough and fatigue x 1 week No sore throat. + cough, No dyspnea. No chest pain. No wheezing.  No nausea No vomiting. + fever, + night sweats and chills  Tried otc meds without help. Hx bronchiectasis in the past, she states she fears this uri has progressed to bronchitis and she requests abx.  REVIEW OF SYSTEMS Constitutional Symptoms       Complains of night sweats and fatigue.     Denies fever, chills, weight loss, and weight gain.  Eyes       Complains of glasses.      Denies change in vision, eye pain, eye discharge, contact lenses, and eye surgery. Ear/Nose/Throat/Mouth       Denies hearing  loss/aids, change in hearing, ear pain, ear discharge, dizziness, frequent runny nose, frequent nose bleeds, sinus problems, sore throat, hoarseness, and tooth pain or bleeding.  Respiratory       Complains of dry cough.      Denies productive cough, wheezing, shortness of breath, asthma, bronchitis, and emphysema/COPD.  Cardiovascular       Denies murmurs, chest pain, and tires easily with exhertion.    Gastrointestinal       Denies stomach pain, nausea/vomiting, diarrhea, constipation, blood in bowel movements, and indigestion. Genitourniary       Denies painful urination, kidney stones, and loss of urinary control. Neurological       Denies paralysis, seizures, and fainting/blackouts. Musculoskeletal       Denies muscle pain, joint pain, joint stiffness, decreased range of motion, redness, swelling, muscle weakness, and gout.  Skin       Denies bruising, unusual mles/lumps or sores, and hair/skin or nail changes.  Psych       Denies mood changes, temper/anger issues, anxiety/stress, speech problems, depression, and sleep problems. Other Comments: cough x 1 week, most at night    Past History:  Past Medical History: Reviewed  history from 11/13/2010 and no changes required. HTN, high cholesterol Bronchietasis Hx of situational anxiety Insomnia - In the past tried doxepin and didn't work.  Hyperlipidemia  Past Surgical History: Reviewed history from 05/11/2009 and no changes required. Badder surgery, tac Hysterectomy   Family History: Reviewed history from 12/06/2008 and no changes required. Mother with BrCa, hi chol, HTN FAther with MI, depression, DM, hi chol  Social History: Reviewed history from 02/07/2009 and no changes required. Married to [employer].  Her husband has many chronic illnesses. HS degree. Has a daughter and son. Son is disabled.  Former Smoker, quit 1954 Alcohol use-no Drug use-no Regular exercise-no Physical Exam General appearance: well  developed, well nourished, no acute distress Head: normocephalic, atraumatic Eyes: conjunctivae and lids normal Pupils: equal, round, reactive to light Ears: normal, no lesions or deformities Nasal: swollen red turbinates with congestion Oral/Pharynx: tongue normal, posterior pharynx without erythema or exudate Neck: neck supple,  trachea midline, no masses Chest/Lungs: scattered rhonchi Heart: regular rate and  rhythm, no murmur Abdomen: soft, non-tender without obvious organomegaly Extremities: normal extremities Neurological: grossly intact and non-focal Skin: no obvious rashes or lesions MSE: oriented to time, place, and person Assessment New Problems: ACUTE BRONCHITIS (ICD-466.0)   Plan New Medications/Changes: AZITHROMYCIN 250 MG TABS (AZITHROMYCIN) Two tabs by mouth on day 1, then 1 tab daily on days 2 through 5  #6 tabs x 0, 02/06/2011, Lajean Manes MD  New Orders: Est. Patient Level IV [16109] Planning Comments:   other sxs care discussed. She declined rx cough med. Risks, benefits, alternatives discussed. Pt voiced understanding and agreement with tx plans.  Follow Up: Follow up in 2-3 days if no improvement, Follow up on an as needed basis, Follow up with Primary Physician  The patient and/or caregiver has been counseled thoroughly with regard to medications prescribed including dosage, schedule, interactions, rationale for use, and possible side effects and they verbalize understanding.  Diagnoses and expected course of recovery discussed and will return if not improved as expected or if the condition worsens. Patient and/or caregiver verbalized understanding.  Prescriptions: AZITHROMYCIN 250 MG TABS (AZITHROMYCIN) Two tabs by mouth on day 1, then 1 tab daily on days 2 through 5  #6 tabs x 0   Entered and Authorized by:   Lajean Manes MD   Signed by:   Lajean Manes MD on 02/06/2011   Method used:   Print then Give to Patient   RxID:   [id number]   Orders  Added: 1)  Est. Patient Level IV [95621]

## 2011-03-31 NOTE — Telephone Encounter (Signed)
Pt notified. KJ LPN 

## 2011-03-31 NOTE — Telephone Encounter (Signed)
Ok for all of the below.

## 2011-03-31 NOTE — Telephone Encounter (Signed)
Pt was discharged from the hospital for s/p fractured clavicle and advanced will be seeing her. They wanted an order from you for to see her 1 xweek 1 week then 2x week for 3 weeks for balance training, fall prevention and vestibular teaching.

## 2011-05-18 ENCOUNTER — Other Ambulatory Visit: Payer: Self-pay | Admitting: Internal Medicine

## 2011-05-22 ENCOUNTER — Other Ambulatory Visit: Payer: Self-pay | Admitting: *Deleted

## 2011-05-22 MED ORDER — CLORAZEPATE DIPOTASSIUM 3.75 MG PO TABS: 3.7500 mg | ORAL_TABLET | Freq: Every day | ORAL | Status: DC

## 2011-05-22 MED ORDER — IMIPRAMINE HCL 25 MG PO TABS: 25.0000 mg | ORAL_TABLET | Freq: Every day | ORAL | Status: DC

## 2011-05-27 ENCOUNTER — Other Ambulatory Visit: Payer: Self-pay | Admitting: Family Medicine

## 2011-05-27 ENCOUNTER — Other Ambulatory Visit: Payer: Self-pay | Admitting: Internal Medicine

## 2011-05-30 DIAGNOSIS — H543 Unqualified visual loss, both eyes: Secondary | ICD-10-CM

## 2011-05-30 DIAGNOSIS — IMO0001 Reserved for inherently not codable concepts without codable children: Secondary | ICD-10-CM

## 2011-05-30 DIAGNOSIS — Z5189 Encounter for other specified aftercare: Secondary | ICD-10-CM

## 2011-05-30 DIAGNOSIS — R279 Unspecified lack of coordination: Secondary | ICD-10-CM

## 2011-06-26 ENCOUNTER — Other Ambulatory Visit: Payer: Self-pay | Admitting: Family Medicine

## 2011-07-02 ENCOUNTER — Other Ambulatory Visit: Payer: Self-pay | Admitting: Family Medicine

## 2011-07-17 ENCOUNTER — Emergency Department (HOSPITAL_COMMUNITY): Payer: Medicare Other

## 2011-07-17 ENCOUNTER — Encounter (HOSPITAL_COMMUNITY): Payer: Self-pay

## 2011-07-17 ENCOUNTER — Emergency Department (HOSPITAL_COMMUNITY)
Admission: EM | Admit: 2011-07-17 | Discharge: 2011-07-17 | Disposition: A | Discharge status: Home / Self Care | Payer: Medicare Other | Attending: Emergency Medicine | Admitting: Emergency Medicine

## 2011-07-17 DIAGNOSIS — R42 Dizziness and giddiness: Secondary | ICD-10-CM | POA: Insufficient documentation

## 2011-07-17 DIAGNOSIS — R112 Nausea with vomiting, unspecified: Secondary | ICD-10-CM | POA: Insufficient documentation

## 2011-07-17 DIAGNOSIS — K5900 Constipation, unspecified: Secondary | ICD-10-CM | POA: Insufficient documentation

## 2011-07-17 DIAGNOSIS — E785 Hyperlipidemia, unspecified: Secondary | ICD-10-CM | POA: Insufficient documentation

## 2011-07-17 DIAGNOSIS — E860 Dehydration: Secondary | ICD-10-CM

## 2011-07-17 DIAGNOSIS — R5381 Other malaise: Secondary | ICD-10-CM | POA: Insufficient documentation

## 2011-07-17 DIAGNOSIS — R05 Cough: Secondary | ICD-10-CM | POA: Insufficient documentation

## 2011-07-17 DIAGNOSIS — J029 Acute pharyngitis, unspecified: Secondary | ICD-10-CM | POA: Insufficient documentation

## 2011-07-17 DIAGNOSIS — R6883 Chills (without fever): Secondary | ICD-10-CM | POA: Insufficient documentation

## 2011-07-17 DIAGNOSIS — R059 Cough, unspecified: Secondary | ICD-10-CM | POA: Insufficient documentation

## 2011-07-17 DIAGNOSIS — I10 Essential (primary) hypertension: Secondary | ICD-10-CM | POA: Insufficient documentation

## 2011-07-17 LAB — CBC
HCT: 40.2 % (ref 36.0–46.0)
Hemoglobin: 13.4 g/dL (ref 12.0–15.0)
MCHC: 33.3 g/dL (ref 30.0–36.0)
RBC: 4.47 MIL/uL (ref 3.87–5.11)
WBC: 5.7 10*3/uL (ref 4.0–10.5)

## 2011-07-17 LAB — BASIC METABOLIC PANEL
BUN: 23 mg/dL (ref 6–23)
CO2: 27 mEq/L (ref 19–32)
Calcium: 9.4 mg/dL (ref 8.4–10.5)
Chloride: 99 mEq/L (ref 96–112)
Creatinine, Ser: 0.75 mg/dL (ref 0.50–1.10)

## 2011-07-17 MED ORDER — SODIUM CHLORIDE 0.9 % IV BOLUS (SEPSIS)
1000.0000 mL | Freq: Once | INTRAVENOUS | Status: AC
  Administered 2011-07-17: 1000 mL via INTRAVENOUS

## 2011-07-17 MED ORDER — ONDANSETRON 8 MG PO TBDP: 8.0000 mg | ORAL_TABLET | Freq: Three times a day (TID) | ORAL | Status: AC | PRN

## 2011-07-17 MED ORDER — ONDANSETRON HCL 4 MG/2ML IJ SOLN
4.0000 mg | Freq: Once | INTRAMUSCULAR | Status: AC
  Administered 2011-07-17: 4 mg via INTRAVENOUS
  Filled 2011-07-17: qty 2

## 2011-07-17 MED ORDER — ACETAMINOPHEN 80 MG PO CHEW: 500.0000 mg | CHEWABLE_TABLET | Freq: Once | ORAL | Status: DC

## 2011-07-17 MED ORDER — IBUPROFEN 200 MG PO TABS
600.0000 mg | ORAL_TABLET | Freq: Once | ORAL | Status: AC
  Administered 2011-07-17: 600 mg via ORAL
  Filled 2011-07-17: qty 3

## 2011-07-17 NOTE — ED Provider Notes (Signed)
History     CSN: [id number]  Arrival date & time 07/17/11  1059   First MD Initiated Contact with Patient 07/17/11 1152      Chief Complaint  Patient presents with  . Cough  . Constipation   HPI The patient reports development of nausea and vomiting without diarrhea over the past 24 hours.  She denies abdominal pain.  She does report chills and sore throat for approximately 6-7 days which now is significantly improved from prior.  She's had mild decrease oral intake.  She's had generalized weakness and lightheadedness.  She denies chest pain or shortness of breath.  She denies melena or hematochezia.  She denies hematemesis.  She does report some constipation.  She does no urinary complaints.  Nothing worsens her symptoms.  Nothing improves her symptoms.  Her symptoms are constant.   Past Medical History  Diagnosis Date  . Hypertension   . Hyperlipidemia   . Anxiety     situational  . Insomnia     Past Surgical History  Procedure Date  . Abdominal hysterectomy   . Bladder surgery     tac    Family History  Problem Relation Age of Onset  . Cancer Mother     breast  . Hyperlipidemia Mother   . Hypertension Mother   . Heart disease Father     MI  . Depression Father   . Hyperlipidemia Father   . Dementia Mother     History  Substance Use Topics  . Smoking status: Former Games developer  . Smokeless tobacco: Not on file  . Alcohol Use: No    OB History    Grav Para Term Preterm Abortions TAB SAB Ect Mult Living                  Review of Systems  All other systems reviewed and are negative.    Allergies  Pravastatin and Simvastatin  Home Medications   Current Outpatient Rx  Name Route Sig Dispense Refill  . ASPIRIN 81 MG PO TBEC Oral Take 1 tablet (81 mg total) by mouth daily. 30 tablet 0  . CALCIUM CARBONATE 1250 MG PO CAPS Oral Take 1,250 mg by mouth 2 (two) times daily with a meal.      . VITAMIN D3 1000 UNITS PO TABS Oral Take 1,000 Units by mouth  daily.      Marland Kitchen CLORAZEPATE DIPOTASSIUM 3.75 MG PO TABS  TAKE ONE TABLET BY MOUTH AT BEDTIME 30 tablet 0  . FLUOXETINE HCL 40 MG PO CAPS  TAKE ONE CAPSULE BY MOUTH EVERY DAY 30 capsule 3  . GUAIFENESIN ER 600 MG PO TB12 Oral Take 1,200 mg by mouth 2 (two) times daily.      . IMIPRAMINE HCL 25 MG PO TABS  TAKE ONE TABLET BY MOUTH AT BEDTIME 30 tablet 0  . ONE-DAILY MULTI VITAMINS PO TABS Oral Take 1 tablet by mouth daily.      Marland Kitchen OMEPRAZOLE 20 MG PO CPDR Oral Take 20 mg by mouth daily.      Marland Kitchen VITAMIN B-6 500 MG PO TABS Oral Take 500 mg by mouth daily.      Marland Kitchen VITAMIN B-12 1000 MCG PO TABS Oral Take 1,000 mcg by mouth daily.        BP 115/62  Pulse 98  Temp(Src) 97.8 F (36.6 C) (Oral)  Resp 18  SpO2 95%  Physical Exam  Nursing note and vitals reviewed. Constitutional: She is oriented to person, place, and  time. She appears well-developed and well-nourished. No distress.  HENT:  Head: Normocephalic and atraumatic.       Mucous membranes dry  Eyes: EOM are normal.  Neck: Normal range of motion.  Cardiovascular: Normal rate, regular rhythm and normal heart sounds.   Pulmonary/Chest: Effort normal and breath sounds normal.  Abdominal: Soft. She exhibits no distension. There is no tenderness.  Musculoskeletal: Normal range of motion.  Neurological: She is alert and oriented to person, place, and time.  Skin: Skin is warm and dry.  Psychiatric: She has a normal mood and affect. Judgment normal.    ED Course  Procedures (including critical care time)  Labs Reviewed  BASIC METABOLIC PANEL - Abnormal; Notable for the following:    Potassium 3.4 (*)    Glucose, Bld 104 (*)    GFR calc non Af Amer 80 (*)    All other components within normal limits  CBC   Dg Chest 2 View  07/17/2011  *RADIOLOGY REPORT*  Clinical Data: Cough, congestion and weakness.  CHEST - 2 VIEW  Comparison: 03/16/2011.  Findings: The heart size and mediastinal contours are normal. The lungs are clear. There is no  pleural effusion or pneumothorax. No acute osseous findings are identified.  Distal left clavicle fracture appears incompletely healed with persistent elevation of the clavicle with respect to the acromion.  IMPRESSION:  1.  No acute cardiopulmonary process. 2.  Question nonunion of distal left clavicle fracture.  Original Report Authenticated By: Gerrianne Scale, M.D.   I personally reviewed the x-ray  1. Nausea and vomiting   2. Dehydration       MDM  Patient feels much better after IV fluids.  Her abdominal exam is benign.  Her laboratory studies are normal.  DC home in good condition with close PCP followup.  The patient understands to return to the ER for new or worsening symptoms.  She reports she feels much better at this time        Lyanne Co, MD 07/17/11 [phone]

## 2011-07-17 NOTE — ED Notes (Signed)
Patient c/o cough, vomiting, constipation, and chills x 8 days

## 2011-07-17 NOTE — Discharge Instructions (Signed)
Dehydration, Adult Dehydration is when you lose more fluids from the body than you take in. Vital organs like the kidneys, brain, and heart cannot function without a proper amount of fluids and salt. Any loss of fluids from the body can cause dehydration.  CAUSES   Vomiting.   Diarrhea.   Excessive sweating.   Excessive urine output.   Fever.  SYMPTOMS  Mild dehydration  Thirst.   Dry lips.   Slightly dry mouth.  Moderate dehydration  Very dry mouth.   Sunken eyes.   Skin does not bounce back quickly when lightly pinched and released.   Dark urine and decreased urine production.   Decreased tear production.   Headache.  Severe dehydration  Very dry mouth.   Extreme thirst.   Rapid, weak pulse (more than 100 beats per minute at rest).   Cold hands and feet.   Not able to sweat in spite of heat and temperature.   Rapid breathing.   Blue lips.   Confusion and lethargy.   Difficulty being awakened.   Minimal urine production.   No tears.  DIAGNOSIS  Your caregiver will diagnose dehydration based on your symptoms and your exam. Blood and urine tests will help confirm the diagnosis. The diagnostic evaluation should also identify the cause of dehydration. TREATMENT  Treatment of mild or moderate dehydration can often be done at home by increasing the amount of fluids that you drink. It is best to drink small amounts of fluid more often. Drinking too much at one time can make vomiting worse. Refer to the home care instructions below. Severe dehydration needs to be treated at the hospital where you will probably be given intravenous (IV) fluids that contain water and electrolytes. HOME CARE INSTRUCTIONS   Ask your caregiver about specific rehydration instructions.   Drink enough fluids to keep your urine clear or pale yellow.   Drink small amounts frequently if you have nausea and vomiting.   Eat as you normally do.   Avoid:   Foods or drinks high in  sugar.   Carbonated drinks.   Juice.   Extremely hot or cold fluids.   Drinks with caffeine.   Fatty, greasy foods.   Alcohol.   Tobacco.   Overeating.   Gelatin desserts.   Wash your hands well to avoid spreading bacteria and viruses.   Only take over-the-counter or prescription medicines for pain, discomfort, or fever as directed by your caregiver.   Ask your caregiver if you should continue all prescribed and over-the-counter medicines.   Keep all follow-up appointments with your caregiver.  SEEK MEDICAL CARE IF:  You have abdominal pain and it increases or stays in one area (localizes).   You have a rash, stiff neck, or severe headache.   You are irritable, sleepy, or difficult to awaken.   You are weak, dizzy, or extremely thirsty.  SEEK IMMEDIATE MEDICAL CARE IF:   You are unable to keep fluids down or you get worse despite treatment.   You have frequent episodes of vomiting or diarrhea.   You have blood or green matter (bile) in your vomit.   You have blood in your stool or your stool looks black and tarry.   You have not urinated in 6 to 8 hours, or you have only urinated a small amount of very dark urine.   You have a fever.   You faint.  MAKE SURE YOU:   Understand these instructions.   Will watch your condition.     Will get help right away if you are not doing well or get worse.  Document Released: 04/14/2005 Document Revised: 04/03/2011 Document Reviewed: 12/02/2010 [employer] Patient Information 2012 [employer]. 

## 2011-07-29 ENCOUNTER — Other Ambulatory Visit: Payer: Self-pay | Admitting: Family Medicine

## 2011-10-03 ENCOUNTER — Other Ambulatory Visit: Payer: Self-pay | Admitting: Family Medicine

## 2011-11-04 ENCOUNTER — Other Ambulatory Visit: Payer: Self-pay | Admitting: Family Medicine

## 2011-11-05 ENCOUNTER — Other Ambulatory Visit: Payer: Self-pay | Admitting: Family Medicine

## 2011-12-03 ENCOUNTER — Other Ambulatory Visit: Payer: Self-pay | Admitting: Family Medicine

## 2012-01-02 ENCOUNTER — Other Ambulatory Visit: Payer: Self-pay | Admitting: Family Medicine

## 2012-01-03 ENCOUNTER — Other Ambulatory Visit: Payer: Self-pay | Admitting: Family Medicine

## 2012-01-07 ENCOUNTER — Ambulatory Visit (INDEPENDENT_AMBULATORY_CARE_PROVIDER_SITE_OTHER): Payer: MEDICARE | Admitting: Family Medicine

## 2012-01-07 ENCOUNTER — Encounter: Payer: Self-pay | Admitting: Family Medicine

## 2012-01-07 VITALS — BP 161/95 | HR 85 | Wt 158.0 lb

## 2012-01-07 DIAGNOSIS — I10 Essential (primary) hypertension: Secondary | ICD-10-CM

## 2012-01-07 DIAGNOSIS — Z1331 Encounter for screening for depression: Secondary | ICD-10-CM

## 2012-01-07 DIAGNOSIS — H6090 Unspecified otitis externa, unspecified ear: Secondary | ICD-10-CM

## 2012-01-07 DIAGNOSIS — Z23 Encounter for immunization: Secondary | ICD-10-CM

## 2012-01-07 DIAGNOSIS — F438 Other reactions to severe stress: Secondary | ICD-10-CM

## 2012-01-07 DIAGNOSIS — H60399 Other infective otitis externa, unspecified ear: Secondary | ICD-10-CM

## 2012-01-07 DIAGNOSIS — R070 Pain in throat: Secondary | ICD-10-CM

## 2012-01-07 DIAGNOSIS — F430 Acute stress reaction: Secondary | ICD-10-CM

## 2012-01-07 MED ORDER — FLUOXETINE HCL 40 MG PO CAPS: 40.0000 mg | ORAL_CAPSULE | Freq: Every day | ORAL | Status: DC

## 2012-01-07 MED ORDER — CLORAZEPATE DIPOTASSIUM 3.75 MG PO TABS: 3.7500 mg | ORAL_TABLET | Freq: Every day | ORAL | Status: DC

## 2012-01-07 MED ORDER — METOPROLOL SUCCINATE ER 25 MG PO TB24: 25.0000 mg | ORAL_TABLET | Freq: Every day | ORAL | Status: DC

## 2012-01-07 MED ORDER — IMIPRAMINE HCL 25 MG PO TABS: 25.0000 mg | ORAL_TABLET | Freq: Every day | ORAL | Status: DC

## 2012-01-07 MED ORDER — HYDROCORTISONE-ACETIC ACID 1-2 % OT SOLN: 4.0000 [drp] | Freq: Two times a day (BID) | OTIC | Status: AC

## 2012-01-07 NOTE — Assessment & Plan Note (Addendum)
She still having a lot of difficulty with her husband which is very stressful for her. Her PHQ 9 score was 3. She did mark S. to have a little interest or pleasure in doing things several days of the week. This is indicative of very mild depression. Overall she is doing well. She says she did try to come off of her Prozac earlier this year and says she didn't do well so she restarted it. She would like a refill today. Continue current regimen I think she is well controlled overall.

## 2012-01-07 NOTE — Progress Notes (Signed)
  Subjective:    Patient ID: Beth [name], female    DOB: 1932-12-14, 78 y.o.   MRN: [id number]  HPI HTN- no CP or SOB.   Mood - Needs refill on her prozac and her impramine Last seen in November 2012. She says she's doing well on her current regimen. She would like a prescription sent to the pharmacy.  Has had some drainage out of the left ear. Has noticed some progressive hearing loss as well. Doesn't want a hearing aid. Was told years ago need to use vinegar in her ear. She says she has had a fungul infectionin her ear.    She also complains of some throat discomfort has been going on for years. She says it feels like a drawing sensation in her mid throat when she swallows saliva. She says she has no difficulty with food getting stuck. No true dysphasia. She says she's always been told that she has embedded tonsils. She denies any significant pain or redness. She says she does have a bad tasting drainage in her throat from time to time.  Review of Systems     Objective:   Physical Exam  Constitutional: She is oriented to person, place, and time. She appears well-developed and well-nourished.  HENT:  Head: Normocephalic and atraumatic.  Right Ear: External ear normal.  Left Ear: External ear normal.  Nose: Nose normal.  Mouth/Throat: Oropharynx is clear and moist.       TMs are clear bilaterally. She has some erythema both canals from rubbing her fingernail and then. She does have a small amount of white debris in the left canal.  Eyes: Conjunctivae normal and EOM are normal. Pupils are equal, round, and reactive to light.  Neck: Neck supple. No thyromegaly present.  Cardiovascular: Normal rate, regular rhythm and normal heart sounds.   Pulmonary/Chest: Effort normal and breath sounds normal. She has no wheezes.  Lymphadenopathy:    She has no cervical adenopathy.  Neurological: She is alert and oriented to person, place, and time.  Skin: Skin is warm and dry.  Psychiatric: She has  a normal mood and affect.          Assessment & Plan:  HTN - Not well controlled. Will start metoprolol. She had been on 5 mg lisinopril in the past and had a hypotensive events that caused dizziness and falls. We will need to monitor her very carefully. Followup in 4-6 weeks. Her heart rate is normal so she should be a tolerate this dose.  OE - Will treat with vosol drops.  Call if not significantly better in one week. She can then continue the drops twice a week for maintenance if needed. Especially since it seems to be more of a chronic problem.  Throat discomfort -  Unclear etiology. Her tonsils are slightly enlarged on exam but they're not erythematous. They do not even appear cryptic. The location of where she's pointing with her throat discomfort is a little lower than the tonsils themselves. This does not sound like true dysphasia. Will refer to ENT for further evaluation.

## 2012-02-18 ENCOUNTER — Ambulatory Visit (INDEPENDENT_AMBULATORY_CARE_PROVIDER_SITE_OTHER): Payer: MEDICARE | Admitting: Family Medicine

## 2012-02-18 ENCOUNTER — Encounter: Payer: Self-pay | Admitting: Family Medicine

## 2012-02-18 VITALS — BP 142/80 | HR 71 | Ht 67.2 in | Wt 158.0 lb

## 2012-02-18 DIAGNOSIS — Z23 Encounter for immunization: Secondary | ICD-10-CM

## 2012-02-18 DIAGNOSIS — I10 Essential (primary) hypertension: Secondary | ICD-10-CM

## 2012-02-18 NOTE — Progress Notes (Signed)
  Subjective:    Patient ID: Beth [name], female    DOB: 11-May-1932, 78 y.o.   MRN: [id number]  HPI Had old root canal removed and she is on antibiotics for that. No pain today.  Did take a hydrocodone last night for pain but nothing this AM. She's doing well.  HTN - No problems with the new metoprolol.  No S.E.  No CP or SOB.  Energy level is still good on her metoprolol.    She had a recent eye exam. She did get new lenses.     Review of Systems     Objective:   Physical Exam  Constitutional: She is oriented to person, place, and time. She appears well-developed and well-nourished.  HENT:  Head: Normocephalic and atraumatic.  Cardiovascular: Normal rate, regular rhythm and normal heart sounds.        No carotid bruits.  Radial pulses 2+.    Pulmonary/Chest: Effort normal and breath sounds normal.  Neurological: She is alert and oriented to person, place, and time.  Skin: Skin is warm and dry.  Psychiatric: She has a normal mood and affect. Her behavior is normal.          Assessment & Plan:  HTN - we discussed the option of increasing her metoprolol to 50 mg. Her blood pressure is much better than last time. Repeat manual blood pressure is at even better. But she still not quite at goal. She was very resistant to increasing her medication. She had had some hypotensive events with a really low dose of lisinopril in the past and is very nervous about adjusting it. Actually she has told her current regimen well so we will continue with this and see her back in 3 months. She's to call if she has any problems with the medication in the meantime.  Reminded her again about the need for shingles vaccine. She will check into it. She still has the prescription at home.

## 2012-04-07 ENCOUNTER — Other Ambulatory Visit: Payer: Self-pay | Admitting: Family Medicine

## 2012-05-11 LAB — COMPLETE METABOLIC PANEL WITH GFR
Alkaline Phosphatase: 70 U/L (ref 39–117)
BUN: 12 mg/dL (ref 6–23)
CO2: 31 mEq/L (ref 19–32)
Creat: 0.63 mg/dL (ref 0.50–1.10)
GFR, Est African American: >89 mL/min
GFR, Est Non African American: 87 mL/min
Glucose, Bld: 88 mg/dL (ref 70–99)
Total Bilirubin: 0.4 mg/dL (ref 0.3–1.2)

## 2012-05-11 LAB — LIPID PANEL
Cholesterol: 198 mg/dL (ref 0–200)
HDL: 49 mg/dL (ref >39)
Total CHOL/HDL Ratio: 4 Ratio
Triglycerides: 127 mg/dL (ref <150)

## 2012-05-19 ENCOUNTER — Encounter: Payer: Self-pay | Admitting: Family Medicine

## 2012-05-19 ENCOUNTER — Ambulatory Visit (INDEPENDENT_AMBULATORY_CARE_PROVIDER_SITE_OTHER): Payer: Medicare Other | Admitting: Family Medicine

## 2012-05-19 ENCOUNTER — Telehealth: Payer: Self-pay | Admitting: Family Medicine

## 2012-05-19 VITALS — BP 146/78 | HR 72 | Resp 18 | Wt 156.0 lb

## 2012-05-19 DIAGNOSIS — H9190 Unspecified hearing loss, unspecified ear: Secondary | ICD-10-CM

## 2012-05-19 DIAGNOSIS — R682 Dry mouth, unspecified: Secondary | ICD-10-CM

## 2012-05-19 DIAGNOSIS — K117 Disturbances of salivary secretion: Secondary | ICD-10-CM

## 2012-05-19 DIAGNOSIS — Z1211 Encounter for screening for malignant neoplasm of colon: Secondary | ICD-10-CM

## 2012-05-19 DIAGNOSIS — M8580 Other specified disorders of bone density and structure, unspecified site: Secondary | ICD-10-CM

## 2012-05-19 DIAGNOSIS — H9191 Unspecified hearing loss, right ear: Secondary | ICD-10-CM

## 2012-05-19 DIAGNOSIS — I10 Essential (primary) hypertension: Secondary | ICD-10-CM

## 2012-05-19 DIAGNOSIS — R413 Other amnesia: Secondary | ICD-10-CM

## 2012-05-19 NOTE — Telephone Encounter (Signed)
I called patient and she said a Dr. Floydene Flock? [place]. I googled him and can not find a [facility]- are you aware of him

## 2012-05-19 NOTE — Progress Notes (Signed)
  Subjective:    Patient ID: Beth [name], female    DOB: 1933/02/02, 79 y.o.   MRN: [id number]  HPI HTN-  Pt denies chest pain, SOB, dizziness, or heart palpitations.  Taking meds as directed w/o problems.  Denies medication side effects.    She thinks she developing Alzheimers.  Her mother had it.  Her short term memory is getting worse. Says mood has been ok. She has not been as angry. 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 On fluoxetine.  Says feels her hearing is dec in her right ear.  She is sleepng well overall. Does use IBU sometimes to help her sleep.  Not on a statin. Had 2 bad panic attacks last week.  One was after a dream about a child.   She is concerned because she feels that her spine is starting to curve, kyphosis. She says she feels like she is starting to look like her mother used to. She does have a history of osteopenia and was on a bisphosphonate at one point in time. She's been off for the last 5 years. .  Review of Systems     Objective:   Physical Exam  Constitutional: She is oriented to person, place, and time. She appears well-developed and well-nourished.  HENT:  Head: Normocephalic and atraumatic.  Right Ear: External ear normal.  Left Ear: External ear normal.  Nose: Nose normal.  Mouth/Throat: Oropharynx is clear and moist.       TMs and canals are clear.   Eyes: Conjunctivae normal and EOM are normal. Pupils are equal, round, and reactive to light.  Neck: Neck supple. No thyromegaly present.  Cardiovascular: Normal rate, regular rhythm and normal heart sounds.   Pulmonary/Chest: Effort normal and breath sounds normal. She has no wheezes.  Lymphadenopathy:    She has no cervical adenopathy.  Neurological: She is alert and oriented to person, place, and time.  Skin: Skin is warm and dry.  Psychiatric: She has a normal mood and affect.          Assessment & Plan:  HTN - Keep up a regular exercise program and make sure you are  eating a healthy diet Try to eat 4 servings of dairy a day, or if you are lactose intolerant take a calcium with vitamin D daily.   Memory Loss- Will check thyroid, anemia, b12 def, and rpr. Asked her to make an appointment next couple weeks so that we can do a Mini-Mental Status exam for further evaluation. Also consider scan of the brain for further evaluation.  Hearing loss, right ear - her ear exam is normal today. I will refer her for audiometry.  Dry Mouth - likely from the imipramine. We discussed this and she opted to not discontinue the medication at this time.  Osteopenia - I will check to see if I can find her last bone density test to see if she is due. She thinks it has been less than 2 years. She was on Fosamax for several years and has been off of it for the last 5 years.

## 2012-05-19 NOTE — Telephone Encounter (Signed)
The patient and find out where she had her last bone density test. I told her I would check but I could not find one in the system or even scanned into the system. I'm not sure if maybe another physician had ordered it. I want to double check to see when she's due for her next one.

## 2012-05-19 NOTE — Telephone Encounter (Signed)
Might be [facility].  Try that one.  There is an older gyn by that last name.

## 2012-05-20 ENCOUNTER — Telehealth: Payer: Self-pay | Admitting: Gastroenterology

## 2012-05-20 LAB — CBC
HCT: 40.4 % (ref 36.0–46.0)
MCH: 30.1 pg (ref 26.0–34.0)
MCHC: 33.4 g/dL (ref 30.0–36.0)
MCV: 90 fL (ref 78.0–100.0)
RDW: 13.4 % (ref 11.5–15.5)
WBC: 6.5 10*3/uL (ref 4.0–10.5)

## 2012-05-20 LAB — COMPLETE METABOLIC PANEL WITH GFR
AST: 21 U/L (ref 0–37)
BUN: 11 mg/dL (ref 6–23)
Calcium: 9.6 mg/dL (ref 8.4–10.5)
Chloride: 96 mEq/L (ref 96–112)
Creat: 0.69 mg/dL (ref 0.50–1.10)
GFR, Est African American: >89 mL/min
Total Bilirubin: 0.4 mg/dL (ref 0.3–1.2)

## 2012-05-20 LAB — RPR

## 2012-05-20 LAB — VITAMIN B12: Vitamin B-12: 1026 pg/mL — ABNORMAL HIGH (ref 211–911)

## 2012-05-20 LAB — SEDIMENTATION RATE: Sed Rate: 1 mm/hr (ref 0–22)

## 2012-05-20 NOTE — Telephone Encounter (Signed)
Called Dr. Arelia Sneddon and they are faxing her records of Dexa results. They are in your box

## 2012-05-20 NOTE — Telephone Encounter (Signed)
Please call pt: Has been 2 years since last DEXA.  Due. I will order new one.

## 2012-05-20 NOTE — Telephone Encounter (Signed)
lmom for pt to call back

## 2012-05-21 NOTE — Telephone Encounter (Signed)
Pt.notified

## 2012-05-24 NOTE — Telephone Encounter (Signed)
I find no record of you seeing or scoping this pt, but she wants to transfer to [place]; do you have a problem with her transferring to another doc here? Thanks.

## 2012-05-24 NOTE — Telephone Encounter (Signed)
Pt did see Dr Matthias Hughs in the past, but now is seeing a [place] PCP and wants to be seen by [place] for GI. She wants her recall COLON to be done by [place].

## 2012-05-24 NOTE — Telephone Encounter (Signed)
She is an [facility]..I am sure Dr. Leone Payor would be glad to see her however.

## 2012-05-25 ENCOUNTER — Telehealth: Payer: Self-pay | Admitting: *Deleted

## 2012-05-25 NOTE — Telephone Encounter (Signed)
Informed pt that Dr Rhea Belton will see her on 06/17/12 to discuss a possible COLON. Mailed her appt info and NP assessment form.

## 2012-05-25 NOTE — Telephone Encounter (Signed)
Message copied by Florene Glen on Tue May 25, 2012 10:30 AM ------      Message from: Beverley Fiedler      Created: Mon May 24, 2012  3:15 PM       Ok to book with me if this is what patient desires.      OV before direct colon      Thanks      JMP      ----- Message -----         From: Linna Hoff, RN         Sent: 05/24/2012   2:37 PM           To: Beverley Fiedler, MD            Will you accept this pt as a direct colon? Hx in past with [facility], can't find anything and then buccini in 2008. She is seeing [facility]CP now and wants her GI to be [facility].

## 2012-06-09 ENCOUNTER — Ambulatory Visit (INDEPENDENT_AMBULATORY_CARE_PROVIDER_SITE_OTHER): Payer: Medicare Other | Admitting: Family Medicine

## 2012-06-09 ENCOUNTER — Encounter: Payer: Self-pay | Admitting: Family Medicine

## 2012-06-09 VITALS — BP 132/73 | HR 74 | Ht 66.0 in | Wt 156.0 lb

## 2012-06-09 DIAGNOSIS — T887XXA Unspecified adverse effect of drug or medicament, initial encounter: Secondary | ICD-10-CM

## 2012-06-09 DIAGNOSIS — R413 Other amnesia: Secondary | ICD-10-CM

## 2012-06-09 DIAGNOSIS — E871 Hypo-osmolality and hyponatremia: Secondary | ICD-10-CM

## 2012-06-09 DIAGNOSIS — R682 Dry mouth, unspecified: Secondary | ICD-10-CM

## 2012-06-09 DIAGNOSIS — K117 Disturbances of salivary secretion: Secondary | ICD-10-CM

## 2012-06-09 LAB — BASIC METABOLIC PANEL WITH GFR
CO2: 29 mEq/L (ref 19–32)
Calcium: 10.3 mg/dL (ref 8.4–10.5)
Creat: 0.67 mg/dL (ref 0.50–1.10)
GFR, Est African American: >89 mL/min
Glucose, Bld: 125 mg/dL — ABNORMAL HIGH (ref 70–99)

## 2012-06-09 MED ORDER — AMBULATORY NON FORMULARY MEDICATION: Status: DC

## 2012-06-09 NOTE — Progress Notes (Signed)
  Subjective:    Patient ID: Beth [name], female    DOB: December 01, 1932, 79 y.o.   MRN: [id number]  HPI She thinks she developing Alzheimers. Her mother had it. Her short term memory is getting worse. Says mood has been ok. She has not been as angry. 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 On fluoxetine. Says feels her hearing is dec in her right ear. She is sleepng well overall. Does use IBU sometimes to help her sleep. Not on a statin. Had 2 bad panic attacks last week. One was after a dream about a child.   We did labs last time to evluate for potential causes or anemia. Here for MMSE today.  She has had more energy.      She is weaning down on her imipramine. She is taking half every other day right now.  She is feeling better.  Memory is better, dry mouth is better. Need to recheck sodium     Review of Systems     Objective:   Physical Exam  Constitutional: She is oriented to person, place, and time. She appears well-developed and well-nourished.  HENT:  Head: Normocephalic and atraumatic.  Cardiovascular: Normal rate, regular rhythm and normal heart sounds.   Pulmonary/Chest: Effort normal and breath sounds normal.  Neurological: She is alert and oriented to person, place, and time.  Skin: Skin is warm and dry.  Psychiatric: She has a normal mood and affect. Her behavior is normal.          Assessment & Plan:  Memory Loss - MMSE score of  30/30  Today. She is sleeping well overall.  She has felt much better.  I gave her reassurance I really don't think she has beginnings of Alzheimer's. At that her memory is actually been better since she is weaned off the imipramine which is fantastic. I think this might have been the primary cause. She's been on it for years but I explained her that sometimes it can exacerbate symptoms.  Hyponatrmia - recheck today her sodium today. We will call with results. I really think this was secondary to the imipramine. But if it's  still low then we will need to do additional workup.  drymouth - much improved on lower dose of imipramine.   Time spent 25 min, > 50% in evaluation and counseling at memory loss.

## 2012-06-10 ENCOUNTER — Ambulatory Visit: Payer: Medicare Other | Admitting: Internal Medicine

## 2012-06-12 ENCOUNTER — Other Ambulatory Visit: Payer: Self-pay | Admitting: Family Medicine

## 2012-06-14 ENCOUNTER — Encounter: Payer: Self-pay | Admitting: Internal Medicine

## 2012-06-17 ENCOUNTER — Ambulatory Visit (INDEPENDENT_AMBULATORY_CARE_PROVIDER_SITE_OTHER): Payer: Medicare Other | Admitting: Internal Medicine

## 2012-06-17 ENCOUNTER — Encounter: Payer: Self-pay | Admitting: Internal Medicine

## 2012-06-17 VITALS — BP 120/70 | HR 63 | Ht 66.0 in | Wt 154.6 lb

## 2012-06-17 DIAGNOSIS — K5900 Constipation, unspecified: Secondary | ICD-10-CM

## 2012-06-17 DIAGNOSIS — Z1211 Encounter for screening for malignant neoplasm of colon: Secondary | ICD-10-CM

## 2012-06-17 DIAGNOSIS — K5790 Diverticulosis of intestine, part unspecified, without perforation or abscess without bleeding: Secondary | ICD-10-CM

## 2012-06-17 DIAGNOSIS — K219 Gastro-esophageal reflux disease without esophagitis: Secondary | ICD-10-CM

## 2012-06-17 DIAGNOSIS — K5730 Diverticulosis of large intestine without perforation or abscess without bleeding: Secondary | ICD-10-CM

## 2012-06-17 MED ORDER — OMEPRAZOLE 20 MG PO CPDR: 20.0000 mg | DELAYED_RELEASE_CAPSULE | Freq: Every day | ORAL | Status: DC

## 2012-06-17 NOTE — Progress Notes (Signed)
Patient ID: Beth [name], female   DOB: [birth year], 79 y.o.   MRN: [id number]  SUBJECTIVE: HPI Beth [name] is a 79 yo female with PMH of hypertension, hyperlipidemia, situational anxiety, diverticulosis with a remote episode of diverticulitis, fibromyalgia who seen in consultation at the request of Dr. Linford Arnold to reestablish care in ensure that her age appropriate screening is up-to-date.  The patient reports she is feeling well. She does have a history of heartburn and is taking omeprazole 20 mg daily. With this medication she is having no issues with heartburn, water brash, dysphagia or odynophagia. No nausea and vomiting. Appetite has been good. No early satiety. She reports her bowel movements vary from mildly constipated to loose. It seems that she has loose stools just after passing a hard more constipated stool. She's had no blood in her stool nor melena. She denies abdominal pain. She does report a history of a rectocele and occasionally has to press on her perineum to help pass stool.  In the past she used Citrucel to help with bowel regularity, and is uncertain why she stopped. She reports having a colonoscopy done with Dr. Matthias Hughs but she does not recall the date.  Review of Systems  As per history of present illness, otherwise negative   Past Medical History  Diagnosis Date  . Hypertension   . Hyperlipidemia   . Anxiety     situational  . Insomnia   . Diverticulitis   . Arthritis   . Fibromyalgia     Current Outpatient Prescriptions  Medication Sig Dispense Refill  . AMBULATORY NON FORMULARY MEDICATION Medication Name: shingles vaccine IM x 1  1 vial  0  . calcium carbonate 1250 MG capsule Take 1,250 mg by mouth 2 (two) times daily with a meal.        . Cholecalciferol (VITAMIN D3) 1000 UNITS tablet Take 1,000 Units by mouth daily.        . clorazepate (TRANXENE) 3.75 MG tablet Take 1 tablet (3.75 mg total) by mouth at bedtime.  30 tablet  5  . FLUoxetine (PROZAC) 40 MG capsule  Take 1 capsule (40 mg total) by mouth daily.  30 capsule  11  . metoprolol succinate (TOPROL-XL) 25 MG 24 hr tablet TAKE ONE TABLET BY MOUTH EVERY DAY  30 tablet  0  . Multiple Vitamin (MULTIVITAMIN) tablet Take 1 tablet by mouth daily.        . Multiple Vitamins-Minerals (PRESERVISION/LUTEIN) CAPS Take by mouth.      Marland Kitchen omeprazole (PRILOSEC) 20 MG capsule Take 1 capsule (20 mg total) by mouth daily.  30 capsule  6  . Pyridoxine HCl (VITAMIN B-6) 500 MG tablet Take 500 mg by mouth daily.        . vitamin B-12 (CYANOCOBALAMIN) 1000 MCG tablet Take 1,000 mcg by mouth daily.        . [DISCONTINUED] albuterol (PROAIR HFA) 108 (90 BASE) MCG/ACT inhaler Inhale 2 puffs into the lungs every 4 (four) hours as needed for wheezing or shortness of breath.  1 Inhaler  0  . [DISCONTINUED] fexofenadine (ALLEGRA) 180 MG tablet Take 180 mg by mouth daily.         No current facility-administered medications for this visit.    Allergies  Allergen Reactions  . Pravastatin     Fatigued.   . Simvastatin Other (See Comments)    Myalgias and memory loss    Family History  Problem Relation Age of Onset  . Cancer Mother  breast  . Hyperlipidemia Mother   . Hypertension Mother   . Heart disease Father     MI  . Depression Father   . Hyperlipidemia Father   . Dementia Mother     History  Substance Use Topics  . Smoking status: Former Games developer  . Smokeless tobacco: Never Used  . Alcohol Use: No    OBJECTIVE: BP 120/70  Pulse 63  Ht 5\' 6"  (1.676 m)  Wt 154 lb 9.6 oz (70.126 kg)  BMI 24.96 kg/m2 Constitutional: Well-developed and well-nourished. No distress. HEENT: Normocephalic and atraumatic. Oropharynx is clear and moist. No oropharyngeal exudate. Conjunctivae are normal. No scleral icterus. Neck: Neck supple. Trachea midline. Cardiovascular: Normal rate, regular rhythm and intact distal pulses.  Pulmonary/chest: Effort normal and breath sounds normal. No wheezing, rales or  rhonchi. Abdominal: Soft, nontender, nondistended. Bowel sounds active throughout. There are no masses palpable. No hepatosplenomegaly. Extremities: no clubbing, cyanosis, or edema Lymphadenopathy: No cervical adenopathy noted. Neurological: Alert and oriented to person place and time. Skin: Skin is warm and dry. No rashes noted. Psychiatric: Normal mood and affect. Behavior is normal.  Labs  CBC    Component Value Date/Time   WBC 6.5 05/19/2012 1142   RBC 4.49 05/19/2012 1142   HGB 13.5 05/19/2012 1142   HCT 40.4 05/19/2012 1142   PLT 305 05/19/2012 1142   MCV 90.0 05/19/2012 1142   MCH 30.1 05/19/2012 1142   MCHC 33.4 05/19/2012 1142   RDW 13.4 05/19/2012 1142   LYMPHSABS 1.3 03/16/2011 1329   MONOABS 0.7 03/16/2011 1329   EOSABS 0.2 03/16/2011 1329   BASOSABS 0.0 03/16/2011 1329    CMP     Component Value Date/Time   NA 134* 06/09/2012 1125   K 4.3 06/09/2012 1125   CL 99 06/09/2012 1125   CO2 29 06/09/2012 1125   GLUCOSE 125* 06/09/2012 1125   BUN 12 06/09/2012 1125   CREATININE 0.67 06/09/2012 1125   CREATININE 0.75 07/17/2011 1300   CALCIUM 10.3 06/09/2012 1125   PROT 6.9 05/19/2012 1142   ALBUMIN 4.4 05/19/2012 1142   AST 21 05/19/2012 1142   ALT 18 05/19/2012 1142   ALKPHOS 64 05/19/2012 1142   BILITOT 0.4 05/19/2012 1142   GFRNONAA 80* 07/17/2011 1300   GFRAA >90 07/17/2011 1300   Colonoscopy performed 05/08/2006 -- bowel preparation good, exam to the cecum. Findings: Multiple small and large mouth diverticula were found in the entire colon, the exam is otherwise normal. Recommended repeat interval of 10 years  ASSESSMENT AND PLAN: 79 yo female with PMH of hypertension, hyperlipidemia, situational anxiety, diverticulosis with a remote episode of diverticulitis, fibromyalgia who seen in consultation at the request of Dr. Linford Arnold to reestablish care in ensure that her age appropriate screening is up-to-date.  1.  GERD -- well controlled on omeprazole 20 mg daily. She will continue  this dose  2.  CRC screening/hx of diverticulosis -- no recent troubles with diverticulitis. She has full colonoscopy with a good preparation and 2008, so therefore she would not be due for repeat screening colonoscopy until January 2018. At this time she'll be 79 years old, and therefore she may not require further colon cancer screening with colonoscopy. This can be addressed further she nears this age either with me or her PCP  3.  Mild constipation -- I will add a daily probiotic and have her restart daily Citrucel.  If this fails to improve her bowel function or she has any problems or questions, she is  asked to call the office.  She'll be seen on an as-needed basis

## 2012-06-17 NOTE — Patient Instructions (Addendum)
We have sent the following medications to your pharmacy for you to pick up at your convenience: Prilosec  Take Citrucel daily and [facility] daily.  1 capsul every day.  Follow up as needed

## 2012-07-07 ENCOUNTER — Encounter: Payer: Medicare Other | Admitting: Internal Medicine

## 2012-07-15 ENCOUNTER — Other Ambulatory Visit: Payer: Self-pay | Admitting: Family Medicine

## 2012-08-04 ENCOUNTER — Encounter: Payer: Self-pay | Admitting: Family Medicine

## 2012-08-04 ENCOUNTER — Ambulatory Visit (INDEPENDENT_AMBULATORY_CARE_PROVIDER_SITE_OTHER): Payer: Medicare Other | Admitting: Family Medicine

## 2012-08-04 VITALS — BP 118/71 | HR 60 | Ht 63.0 in | Wt 154.0 lb

## 2012-08-04 DIAGNOSIS — F4323 Adjustment disorder with mixed anxiety and depressed mood: Secondary | ICD-10-CM

## 2012-08-04 MED ORDER — SERTRALINE HCL 50 MG PO TABS: ORAL_TABLET | ORAL | Status: DC

## 2012-08-04 MED ORDER — FLUOXETINE HCL 10 MG PO TABS: ORAL_TABLET | ORAL | Status: DC

## 2012-08-04 NOTE — Patient Instructions (Addendum)
Prozac: Stop taking fluoxetine/prozac 40mg  cap, start 10mg  once a day for two days then switch to zoloft tablets. Zoloft: After taking fluoxetine/prozac regimen, take half a tablet by mouth daily for two days then full tab daily.

## 2012-08-04 NOTE — Progress Notes (Signed)
CC: Beth [name] is a 79 y.o. female is here for discuss anger issues   Subjective: HPI:  Patient complains of anger and frustration that has been present for over 3 years. She describes is of moderate to severe severity it has reached its peak over the past week. This is worsened by complete dependence on her of her husband who is dependent in all activities of daily living. Specifically worsened with help in transferring, grooming, feeding, bathing, toileting. Worsened by daily and non-stop nonsensical responses from her husband and his ongoing lack of interest in their relationship.  Worsened by lack of social support and time to pursue patient's own hobbies and interest.  Patient describes sensation of wanting to "pop" her husband due to this frustration. She tells me that she would never carry out this and does not have intention to ever go through with this nor thoughts of wanting to harm herself or others.  She has tried counseling in the past which helped temporarily. She is taking Prozac for years which helped initially. Symptoms are daily and worsening. Nothing else seems to make better. Symptoms are present all hours of the day.  Denies fevers, chills, paranoia, hallucinations, trouble sleeping, unintentional weight loss, nor sense of physical un wellness   Review Of Systems Outlined In HPI  Past Medical History  Diagnosis Date  . Hypertension   . Hyperlipidemia   . Anxiety     situational  . Insomnia   . Diverticulitis   . Arthritis   . Fibromyalgia      Family History  Problem Relation Age of Onset  . Cancer Mother     breast  . Hyperlipidemia Mother   . Hypertension Mother   . Heart disease Father     MI  . Depression Father   . Hyperlipidemia Father   . Dementia Mother      History  Substance Use Topics  . Smoking status: Former Games developer  . Smokeless tobacco: Never Used  . Alcohol Use: No     Objective: Filed Vitals:   08/04/12 1036  BP: 118/71  Pulse:  60    Vital signs reviewed. General: Alert and Oriented, No Acute Distress HEENT: Pupils equal, round, reactive to light. Conjunctivae clear.  External ears unremarkable.  Moist mucous membranes. Lungs: Clear and comfortable work of breathing, speaking in full sentences without accessory muscle use. Cardiac: Regular rate and rhythm.  Neuro: CN II-XII grossly intact, gait normal. Extremities: No peripheral edema.  Strong peripheral pulses.  Mental Status: Anxious, emotional, tearful, logical thought process, capacity appears intact. Skin: Warm and dry.  Assessment & Plan: Beth [name] was seen today for discuss anger issues.  Diagnoses and associated orders for this visit:  Adjustment disorder with mixed anxiety and depressed mood - sertraline (ZOLOFT) 50 MG tablet; After taking fluoxetine/prozac regimen, take half a tablet by mouth daily for two days then full tab daily. - FLUoxetine (PROZAC) 10 MG tablet; Stop taking fluoxetine/prozac 40mg  cap, start 10mg  once a day for two days then switch to zoloft tablets.    Patient required extensive motivational counseling to help recognize things that she can and cannot control in her life at this time. We discussed strategies to help offload the dependency her husband has placed on her, unfortunately she is pessimistic about most of this due to financial reasons. Discussed the importance of reestablishing with counseling/mental health.  Due to the side effects of anxiety and restlessness with Prozac in the elderly we'll transition to Zoloft. Asked  her to followup with PCP in 1-2 weeks. Contract for safety with respect to herself and husband  40 minutes spent face-to-face during visit today of which at least 90% was counseling or coordinating care regarding adjustment disorder with mixed anxiety and depression.   Return in about 2 weeks (around 08/18/2012).

## 2012-08-12 ENCOUNTER — Other Ambulatory Visit: Payer: Self-pay | Admitting: Family Medicine

## 2012-08-13 ENCOUNTER — Other Ambulatory Visit: Payer: Self-pay | Admitting: Family Medicine

## 2012-08-16 ENCOUNTER — Encounter: Payer: Self-pay | Admitting: *Deleted

## 2012-09-11 ENCOUNTER — Other Ambulatory Visit: Payer: Self-pay | Admitting: Family Medicine

## 2012-09-14 ENCOUNTER — Other Ambulatory Visit: Payer: Self-pay | Admitting: Family Medicine

## 2012-10-12 ENCOUNTER — Other Ambulatory Visit: Payer: Self-pay | Admitting: *Deleted

## 2012-10-12 MED ORDER — CLORAZEPATE DIPOTASSIUM 3.75 MG PO TABS: ORAL_TABLET | ORAL | Status: DC

## 2012-10-12 NOTE — Progress Notes (Signed)
Left refill information on vm due to they only had 1 pharmacist on staff.Loralee Pacas [facility]

## 2012-10-13 ENCOUNTER — Encounter: Payer: Self-pay | Admitting: Family Medicine

## 2012-10-13 ENCOUNTER — Ambulatory Visit (INDEPENDENT_AMBULATORY_CARE_PROVIDER_SITE_OTHER): Payer: Medicare Other | Admitting: Family Medicine

## 2012-10-13 VITALS — BP 138/89 | HR 76 | Wt 153.0 lb

## 2012-10-13 DIAGNOSIS — S20211A Contusion of right front wall of thorax, initial encounter: Secondary | ICD-10-CM

## 2012-10-13 DIAGNOSIS — I10 Essential (primary) hypertension: Secondary | ICD-10-CM

## 2012-10-13 DIAGNOSIS — S20219A Contusion of unspecified front wall of thorax, initial encounter: Secondary | ICD-10-CM

## 2012-10-13 DIAGNOSIS — F430 Acute stress reaction: Secondary | ICD-10-CM

## 2012-10-13 MED ORDER — DICLOFENAC SODIUM 50 MG PO TBEC: DELAYED_RELEASE_TABLET | ORAL | Status: DC

## 2012-10-13 MED ORDER — ORPHENADRINE CITRATE ER 100 MG PO TB12: ORAL_TABLET | ORAL | Status: DC

## 2012-10-13 NOTE — Progress Notes (Signed)
CC: Beth [name] is a 79 y.o. female is here for Back Pain   Subjective: HPI:  Patient presents for same day appointment with complaint of back pain that has been present for 3 weeks of moderate severity, overall mild at rest however moderate with coughing, laughing, twisting to the left or right suddenly. Pain is localized just to the right of the midline in the mid back mild radiation around the right flank towards the front. Improves with ice he hot, no other interventions as of yet. Nothing else makes better or worse other than above. Pain present on a daily basis since onset with complete resolution 3 days ago only to return one day ago. She denies recent or remote trauma or overexertion. She complains of cough but unchanged from baseline, nonproductive without blood in sputum without shortness of breath or wheezing. She denies exertional chest pain, abdominal pain, nausea, change in appetite, diarrhea, constipation, skin or scleral discoloration. Denies saddle paresthesia, bowel or bladder incontinence nor weakness in extremities  Patient informs me that since switching to Zoloft she has had a tremendous improvement in her quality of life at home. She often feels frustrated with her husband however is no longer interfering with their relationship. She tells me she denies anxiety, depression, paranoia, mental disturbance, irritability, nor insomnia.  She informs me she is taking metoprolol a daily basis without missed doses without outside blood pressures to report. Denies headaches, motor or sensory disturbances, nor limb claudication    Review Of Systems Outlined In HPI  Past Medical History  Diagnosis Date  . Hypertension   . Hyperlipidemia   . Anxiety     situational  . Insomnia   . Diverticulitis   . Arthritis   . Fibromyalgia      Family History  Problem Relation Age of Onset  . Cancer Mother     breast  . Hyperlipidemia Mother   . Hypertension Mother   . Heart disease  Father     MI  . Depression Father   . Hyperlipidemia Father   . Dementia Mother      History  Substance Use Topics  . Smoking status: Former Games developer  . Smokeless tobacco: Never Used  . Alcohol Use: No     Objective: Filed Vitals:   10/13/12 1437  BP: 138/89  Pulse: 76    General: Alert and Oriented, No Acute Distress HEENT: Pupils equal, round, reactive to light. Conjunctivae clear.  Moist mucous membranes Lungs: Clear to auscultation bilaterally, no wheezing/ronchi/rales.  Comfortable work of breathing. Good air movement. Cardiac: Regular rate and rhythm. Normal S1/S2.  No murmurs, rubs, nor gallops.   Abdomen: Obese soft nontender Back: No midline thoracic or lumbar spinous process tenderness, negative stork test bilaterally, pain is reproduced with lower thoracic paraspinal musculature palpation and with lateral compression of approximately ribs 7 and 8. Extremities: No peripheral edema.  Strong peripheral pulses.  Mental Status: No depression, anxiety, nor agitation. Skin: Warm and dry. No skin changes overlying the site of discomfort  Assessment & Plan: Beth [name] was seen today for back pain.  Diagnoses and associated orders for this visit:  Bruised ribs, right, initial encounter - diclofenac (VOLTAREN) 50 MG EC tablet; Take one tablet every 8 hours only as needed for pain, take with small snack. - orphenadrine (NORFLEX) 100 MG tablet; Once a night to prevent rib pain at night.  HYPERTENSION, BENIGN  Reaction, situational, acute, to stress    Essential hypertension: Chronic controlled condition, continue metoprolol Acute  situational reaction to stress: Chronic controlled condition, continue Zoloft Discussed with patient I believe her chest wall pain is due to improves rib, she's asking for an antibiotic and I have encouraged her to start with muscle relaxers, heat, anti-inflammatories above and if not improved whatsoever given her cough I will call her in something on  Friday if she calls me  Return if symptoms worsen or fail to improve.

## 2012-10-15 ENCOUNTER — Telehealth: Payer: Self-pay | Admitting: *Deleted

## 2012-10-15 NOTE — Telephone Encounter (Signed)
Pt called earlier and left a message that he back was still hurting and she didn't feel well. Tried to call pt back twice and line was busy

## 2012-10-19 ENCOUNTER — Telehealth: Payer: Self-pay | Admitting: *Deleted

## 2012-10-19 NOTE — Telephone Encounter (Signed)
If not feeling well then recommend wait until after appt to order labs incase we need to order something extra.

## 2012-10-19 NOTE — Telephone Encounter (Signed)
Pt wanted to know if she could get orders for labs to be drawn in the morning. I looked back to see if there was anything that she would need to have repeated and the only concern was her sodium was low and she was to have this rechecked back in late March early April. She stated that she hasn't been feeling well and will discuss this at her appt tomorrow.Beth [name] [place]

## 2012-10-20 ENCOUNTER — Ambulatory Visit (INDEPENDENT_AMBULATORY_CARE_PROVIDER_SITE_OTHER): Payer: Medicare Other

## 2012-10-20 ENCOUNTER — Ambulatory Visit (INDEPENDENT_AMBULATORY_CARE_PROVIDER_SITE_OTHER): Payer: Medicare Other | Admitting: Family Medicine

## 2012-10-20 ENCOUNTER — Encounter: Payer: Self-pay | Admitting: Family Medicine

## 2012-10-20 VITALS — BP 133/75 | HR 68 | Wt 151.0 lb

## 2012-10-20 DIAGNOSIS — R5381 Other malaise: Secondary | ICD-10-CM

## 2012-10-20 DIAGNOSIS — E871 Hypo-osmolality and hyponatremia: Secondary | ICD-10-CM

## 2012-10-20 DIAGNOSIS — I10 Essential (primary) hypertension: Secondary | ICD-10-CM

## 2012-10-20 DIAGNOSIS — R059 Cough, unspecified: Secondary | ICD-10-CM

## 2012-10-20 DIAGNOSIS — J3489 Other specified disorders of nose and nasal sinuses: Secondary | ICD-10-CM

## 2012-10-20 DIAGNOSIS — R05 Cough: Secondary | ICD-10-CM

## 2012-10-20 DIAGNOSIS — R062 Wheezing: Secondary | ICD-10-CM

## 2012-10-20 MED ORDER — METOPROLOL SUCCINATE ER 25 MG PO TB24: ORAL_TABLET | ORAL | Status: DC

## 2012-10-20 MED ORDER — DOXYCYCLINE HYCLATE 100 MG PO TABS: 100.0000 mg | ORAL_TABLET | Freq: Two times a day (BID) | ORAL | Status: DC

## 2012-10-20 MED ORDER — CLORAZEPATE DIPOTASSIUM 3.75 MG PO TABS: ORAL_TABLET | ORAL | Status: DC

## 2012-10-20 MED ORDER — PREDNISONE 20 MG PO TABS: 40.0000 mg | ORAL_TABLET | Freq: Every day | ORAL | Status: DC

## 2012-10-20 MED ORDER — AMBULATORY NON FORMULARY MEDICATION: Status: DC

## 2012-10-20 NOTE — Progress Notes (Signed)
  Subjective:    Patient ID: Beth [name], female    DOB: 05/08/1932, 79 y.o.   MRN: [id number]  HPI HA started 3-4 weeks ago. Says a dull HA behind both eyes.  Not sure if any vision changes. Has eye appt later this summer.  Occ wakes up with the HA but doesn't actually wake her up.  She has had bad allergies and has been taking generic zyrtec.  Had some mild cough over the last 3-4 weeks. Has had night sweats as well. No no nasal congesttion.  IIncrease post nasal drip. She feels tired all the time.  No ear pain.  No worsening or alleviating symptoms. She was recently on 2 courses of amoxicillin for an abscessed tooth but that issue has been resolved.  HTN-  Pt denies chest pain, SOB, dizziness, or heart palpitations.  Taking meds as directed w/o problems.  Denies medication side effects.    Review of Systems No vision change or hearing loss.    Objective:   Physical Exam  Constitutional: She is oriented to person, place, and time. She appears well-developed and well-nourished.  HENT:  Head: Normocephalic and atraumatic.  Right Ear: External ear normal.  Left Ear: External ear normal.  Nose: Nose normal.  Mouth/Throat: Oropharynx is clear and moist.  TMs and canals are clear.   Eyes: Conjunctivae and EOM are normal. Pupils are equal, round, and reactive to light.  Neck: Neck supple. No thyromegaly present.  Cardiovascular: Normal rate, regular rhythm and normal heart sounds.   Pulmonary/Chest: Effort normal and breath sounds normal. She has no wheezes.  Expiratory wheeze, louder on the RLL.   Lymphadenopathy:    She has no cervical adenopathy.  Neurological: She is alert and oriented to person, place, and time.  Skin: Skin is warm and dry.  Psychiatric: She has a normal mood and affect.          Assessment & Plan:  Headache /Sinus pain - likely sinusitis based on her description of pain. She does not have the classic sinus congestion but her pain has been persistent she felt  weak and tired. No visual changes. She's also had a more prominent cough and postnasal drip. She is Re: taking allergy medications without significant relief or improvement. Would recommend treatment with antibiotic.  Cough - because she does have some expiratory wheezing on exam today would like to get a chest x-ray. Pulse ox is 96%. I'm not sure what her baseline is. We'll call with results once available.  Hyponatremia-her last labs her sodium was borderline low. Due to repeat today,  In fact she is overdue.  Hypertension-well-controlled. F/U in 6 mo.

## 2012-10-20 NOTE — Addendum Note (Signed)
Addended by: Nani Gasser D on: 10/20/2012 01:33 PM   Modules accepted: Orders

## 2012-10-21 LAB — BASIC METABOLIC PANEL WITH GFR
BUN: 12 mg/dL (ref 6–23)
CO2: 30 mEq/L (ref 19–32)
Chloride: 100 mEq/L (ref 96–112)
Creat: 0.71 mg/dL (ref 0.50–1.10)
Glucose, Bld: 91 mg/dL (ref 70–99)

## 2012-10-21 LAB — CBC WITH DIFFERENTIAL/PLATELET
Basophils Relative: 1 % (ref 0–1)
Eosinophils Absolute: 0.4 10*3/uL (ref 0.0–0.7)
HCT: 40.2 % (ref 36.0–46.0)
Hemoglobin: 13.3 g/dL (ref 12.0–15.0)
Lymphs Abs: 1.4 10*3/uL (ref 0.7–4.0)
MCH: 29.9 pg (ref 26.0–34.0)
MCHC: 33.1 g/dL (ref 30.0–36.0)
MCV: 90.3 fL (ref 78.0–100.0)
Monocytes Absolute: 0.5 10*3/uL (ref 0.1–1.0)
Monocytes Relative: 8 % (ref 3–12)
RBC: 4.45 MIL/uL (ref 3.87–5.11)

## 2012-11-02 ENCOUNTER — Other Ambulatory Visit: Payer: Self-pay | Admitting: Family Medicine

## 2012-11-08 ENCOUNTER — Encounter: Payer: Self-pay | Admitting: Family Medicine

## 2012-11-08 ENCOUNTER — Ambulatory Visit (INDEPENDENT_AMBULATORY_CARE_PROVIDER_SITE_OTHER): Payer: Medicare Other | Admitting: Family Medicine

## 2012-11-08 VITALS — BP 117/77 | HR 59 | Ht 63.0 in | Wt 150.0 lb

## 2012-11-08 DIAGNOSIS — R062 Wheezing: Secondary | ICD-10-CM

## 2012-11-08 DIAGNOSIS — J449 Chronic obstructive pulmonary disease, unspecified: Secondary | ICD-10-CM

## 2012-11-08 DIAGNOSIS — J4489 Other specified chronic obstructive pulmonary disease: Secondary | ICD-10-CM

## 2012-11-08 MED ORDER — ALBUTEROL SULFATE HFA 108 (90 BASE) MCG/ACT IN AERS: 2.0000 | INHALATION_SPRAY | Freq: Four times a day (QID) | RESPIRATORY_TRACT | Status: DC | PRN

## 2012-11-08 MED ORDER — AZELASTINE HCL 0.15 % NA SOLN: 1.0000 | Freq: Two times a day (BID) | NASAL | Status: DC | PRN

## 2012-11-08 NOTE — Assessment & Plan Note (Addendum)
Spirometry performed on 11/08/2012 showing FVC of 88%, FEV1 of 71%, ratio of 60%. No significant improvement after albuterol treatment. Findings consistent with mild COPD. started on albuterol when necessary. Followup in 3 or 4 months to see how well she's doing with it and if she has any questions. If she's using it frequently then we may consider adding a controller inhaler. Please call if any problems. Discussed avoidance of triggers and also discussed the importance of getting yearly flu vaccine.

## 2012-11-08 NOTE — Progress Notes (Signed)
  Subjective:    Patient ID: Beth [name], female    DOB: May 31, 1932, 79 y.o.   MRN: [id number]  HPI Here for spirometry. She has had a prolonged cough with some wheezing. When I last saw her I diagnosed with acute sinusitis and she was placed on doxycycline. She says she is much much better. In fact a sinus pain and pressure headaches have completely resolved. She still has a little bit of postnasal drip at night and still continues to have running nose. Still slight cough.     Review of Systems     Objective:   Physical Exam  Constitutional: She is oriented to person, place, and time. She appears well-developed and well-nourished.  HENT:  Head: Normocephalic and atraumatic.  Cardiovascular: Normal rate, regular rhythm and normal heart sounds.   Pulmonary/Chest: Effort normal and breath sounds normal.  Neurological: She is alert and oriented to person, place, and time.  Skin: Skin is warm and dry.  Psychiatric: She has a normal mood and affect. Her behavior is normal.          Assessment & Plan:  Sinusitis-resolved.  Postnasal drip-I. would like her to try a nasal antihistamine at bedtime to see if this helps. Start with once a day but can be discharged today if needed.

## 2012-11-16 ENCOUNTER — Encounter: Payer: Self-pay | Admitting: Family Medicine

## 2012-12-08 ENCOUNTER — Other Ambulatory Visit: Payer: Self-pay | Admitting: Family Medicine

## 2012-12-09 ENCOUNTER — Other Ambulatory Visit: Payer: Self-pay | Admitting: *Deleted

## 2012-12-09 MED ORDER — CLORAZEPATE DIPOTASSIUM 3.75 MG PO TABS: ORAL_TABLET | ORAL | Status: DC

## 2013-01-09 ENCOUNTER — Other Ambulatory Visit: Payer: Self-pay | Admitting: Family Medicine

## 2013-01-13 ENCOUNTER — Other Ambulatory Visit: Payer: Self-pay | Admitting: Family Medicine

## 2013-01-13 MED ORDER — CLORAZEPATE DIPOTASSIUM 3.75 MG PO TABS: ORAL_TABLET | ORAL | Status: DC

## 2013-02-07 ENCOUNTER — Other Ambulatory Visit: Payer: Self-pay | Admitting: Family Medicine

## 2013-02-18 ENCOUNTER — Telehealth: Payer: Self-pay

## 2013-02-18 MED ORDER — TRAZODONE HCL 50 MG PO TABS: 25.0000 mg | ORAL_TABLET | Freq: Every day | ORAL | Status: DC

## 2013-02-18 NOTE — Telephone Encounter (Signed)
Start with half a tab of the trazodone at bedtime and see how she feels. She does okay then she can increase to whole tab. If one tab is not effective she can go up to 2 tabs but do not go more than that without talking to me first. If she feels a little groggy first thing in the morning then she can also move the pill and head, for example taking it one or 2 hours before bedtime. Sometimes it takes a little adjusting to get used to it. But, overall I actually think it's safer than the clorazepate

## 2013-02-18 NOTE — Telephone Encounter (Signed)
The cost of clorazepate has gone up to 75 dollars a month. She can not afford the medication. Is there something cheaper for her to take? Trazodone is on [facility]'s 4 dollar list.

## 2013-02-19 NOTE — Telephone Encounter (Signed)
Left message for patient to call back  

## 2013-02-25 ENCOUNTER — Ambulatory Visit (INDEPENDENT_AMBULATORY_CARE_PROVIDER_SITE_OTHER): Payer: Medicare Other | Admitting: Family Medicine

## 2013-02-25 ENCOUNTER — Encounter: Payer: Self-pay | Admitting: Family Medicine

## 2013-02-25 VITALS — BP 144/80 | HR 67 | Wt 151.0 lb

## 2013-02-25 DIAGNOSIS — H9190 Unspecified hearing loss, unspecified ear: Secondary | ICD-10-CM

## 2013-02-25 DIAGNOSIS — M19049 Primary osteoarthritis, unspecified hand: Secondary | ICD-10-CM

## 2013-02-25 DIAGNOSIS — M549 Dorsalgia, unspecified: Secondary | ICD-10-CM

## 2013-02-25 DIAGNOSIS — I10 Essential (primary) hypertension: Secondary | ICD-10-CM

## 2013-02-25 DIAGNOSIS — Z23 Encounter for immunization: Secondary | ICD-10-CM

## 2013-02-25 NOTE — Progress Notes (Signed)
Subjective:    Patient ID: Beth [name], female    DOB: [birth year], 78 y.o.   MRN: [id number]  HPI Aching in ring fingers. It is almost the whole finger. Pain starts at just about the PIP and radiates down to the MCP. Stiff for about an hour and then feels better.  No swelling or redness.  No prior history family history rheumatoid arthritis. No other major joints affecting her currently. 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 She denies any unusual activities or repetitive activities that may have stressed the joints or tendons.  Pain in her side still hurts afer her fall in the tub. She said when her back bothers her at all he seems to hurt in the same place that she had after she fell. It bothered her recently so she did use some old muscle relaxers and that was helpful. She's not having any pain today. She just wanted to mention it to me.   Having cataract surgery in about 6 mo.    She requests a Hearing referral to Dr. Dorma Russell. We have sent her to her previous internist and throat the head is recommended hearing aids. She wants a more thorough evaluation and requests to be seen in [place]. She says she feels like she's a misstep in her right ear.  Got her shingles vaccine last Monday.     COPD - has used her abluterol a couple of times to help with sputum production.Marland Kitchen  Sputum is white. No color change.Marland Kitchen Has noticed occ SOB with activities this is a very minor. She has not noticed a change in her shortness of breath..  Hears wheezing at night occassionally. She rarely uses the albuterol. Last spirometry was in July of this year. Showing mild COPD.  Hypertension-no chest pain, palpitations. Occasional shortness of breath with activity but not new and related mostly to her COPD. Taking medications as prescribed without any side effects or problems.  Review of Systems     Objective:   Physical Exam  Constitutional: She is oriented to person, place, and time. She appears well-developed  and well-nourished.  HENT:  Head: Normocephalic and atraumatic.  Cardiovascular: Normal rate, regular rhythm and normal heart sounds.   Pulmonary/Chest: Effort normal and breath sounds normal.  Musculoskeletal:  Finger exam is completely normal. Back check she has very little deformity for 79 year old female. She has no swelling or redness or bogginess of the joints. She has a little bit of hypertrophy at the base of the thumb. Normal range of motion normal strength.  Neurological: She is alert and oriented to person, place, and time.  Skin: Skin is warm and dry.  Psychiatric: She has a normal mood and affect. Her behavior is normal.          Assessment & Plan:  *Osteoarthritis of the fingers-gave reassurance. Her exam is completely normal and prescription for pain is most consistent with osteoarthritis. She'll uses Tylenol for pain control and interpersonal issues or as needed.  Hearing loss-up he happy to refer her to [facility] to see Dr. Dorma Russell.  COPD-stable. No recent exacerbations. We'll definitely repeat her spirometry next July. For now that she feels like she is noticing more symptoms we could still consider putting her on Spiriva or 2 doors. She does not need any refills on her albuterol today. Next  Back pain-currently under control. Not actively bothering her. I did offer to refill her Amoxil elixir to use as needed if her back flares but she declined at  this time is that she will call if she needs it.  Hypertension-well controlled. Continue current regimen.

## 2013-03-16 ENCOUNTER — Other Ambulatory Visit: Payer: Self-pay | Admitting: Family Medicine

## 2013-03-16 NOTE — Telephone Encounter (Signed)
Pt needs a f/u appt before future refills

## 2013-03-19 ENCOUNTER — Other Ambulatory Visit: Payer: Self-pay | Admitting: Family Medicine

## 2013-03-19 IMAGING — CR DG WRIST COMPLETE 3+V*R*
4 series · 4 of 4 positions shown · non-contrast
Comparison: None.

CLINICAL DATA: Fell with redness and swelling

RIGHT WRIST - COMPLETE 3+ VIEW

[view not recorded (1 of 4)]
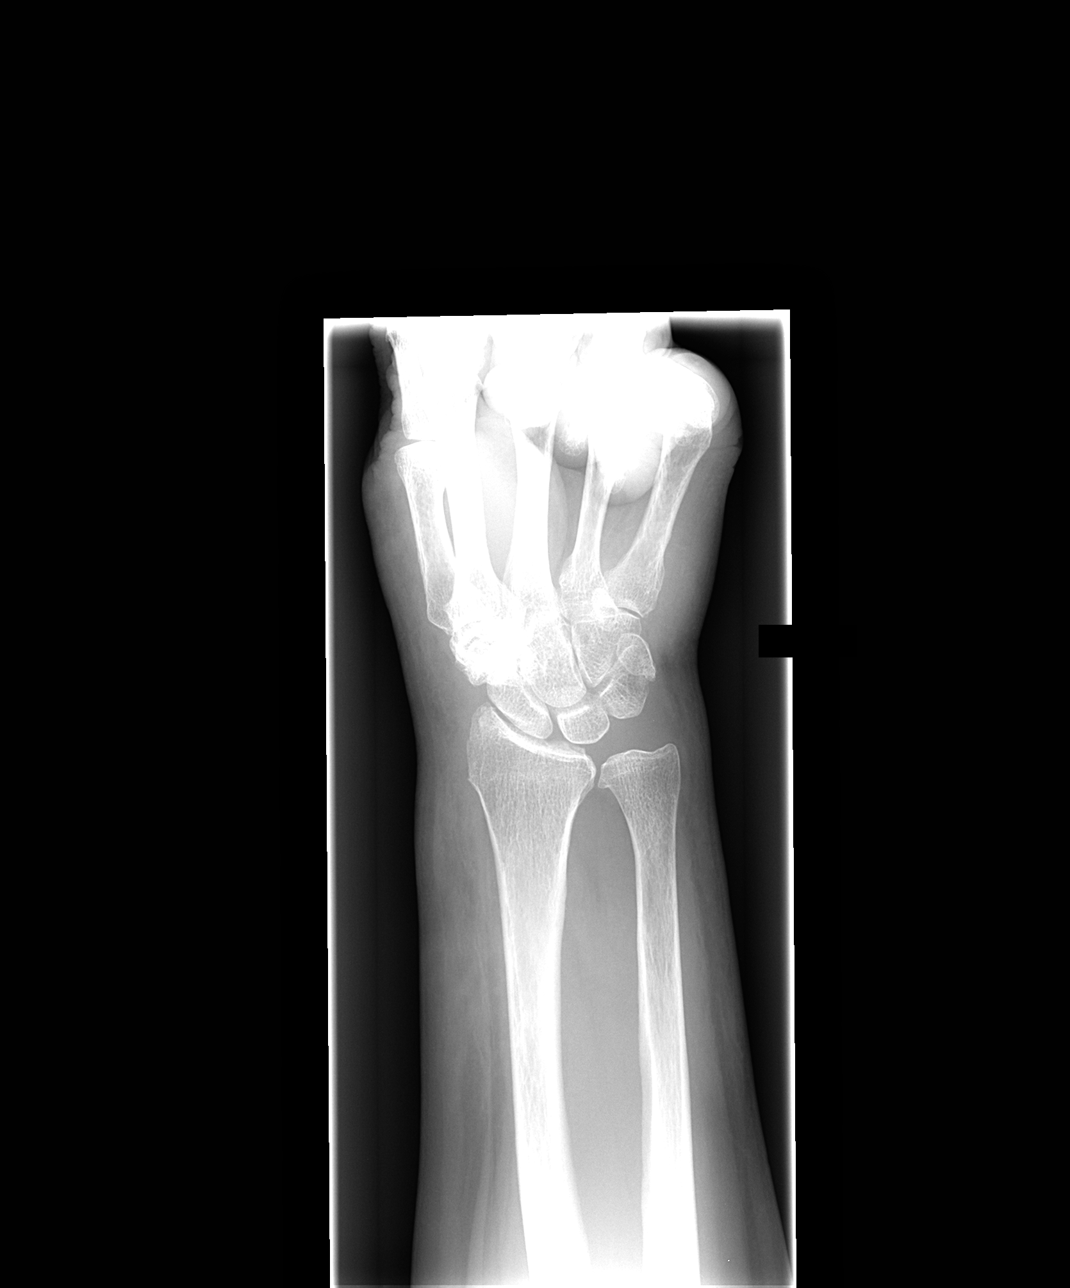

[view not recorded (2 of 4)]
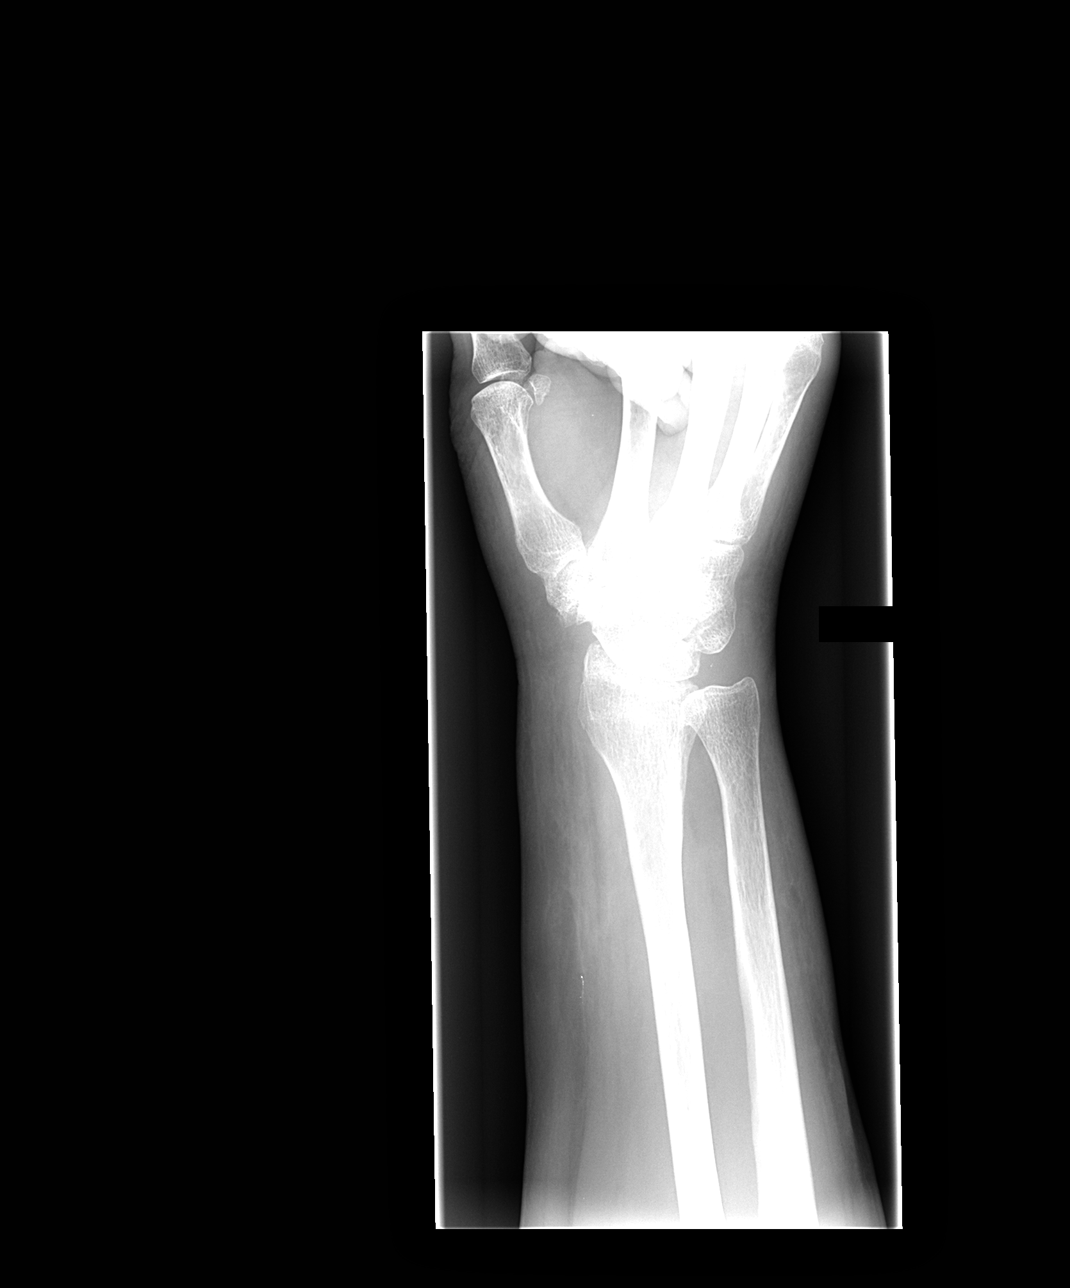

[view not recorded (3 of 4)]
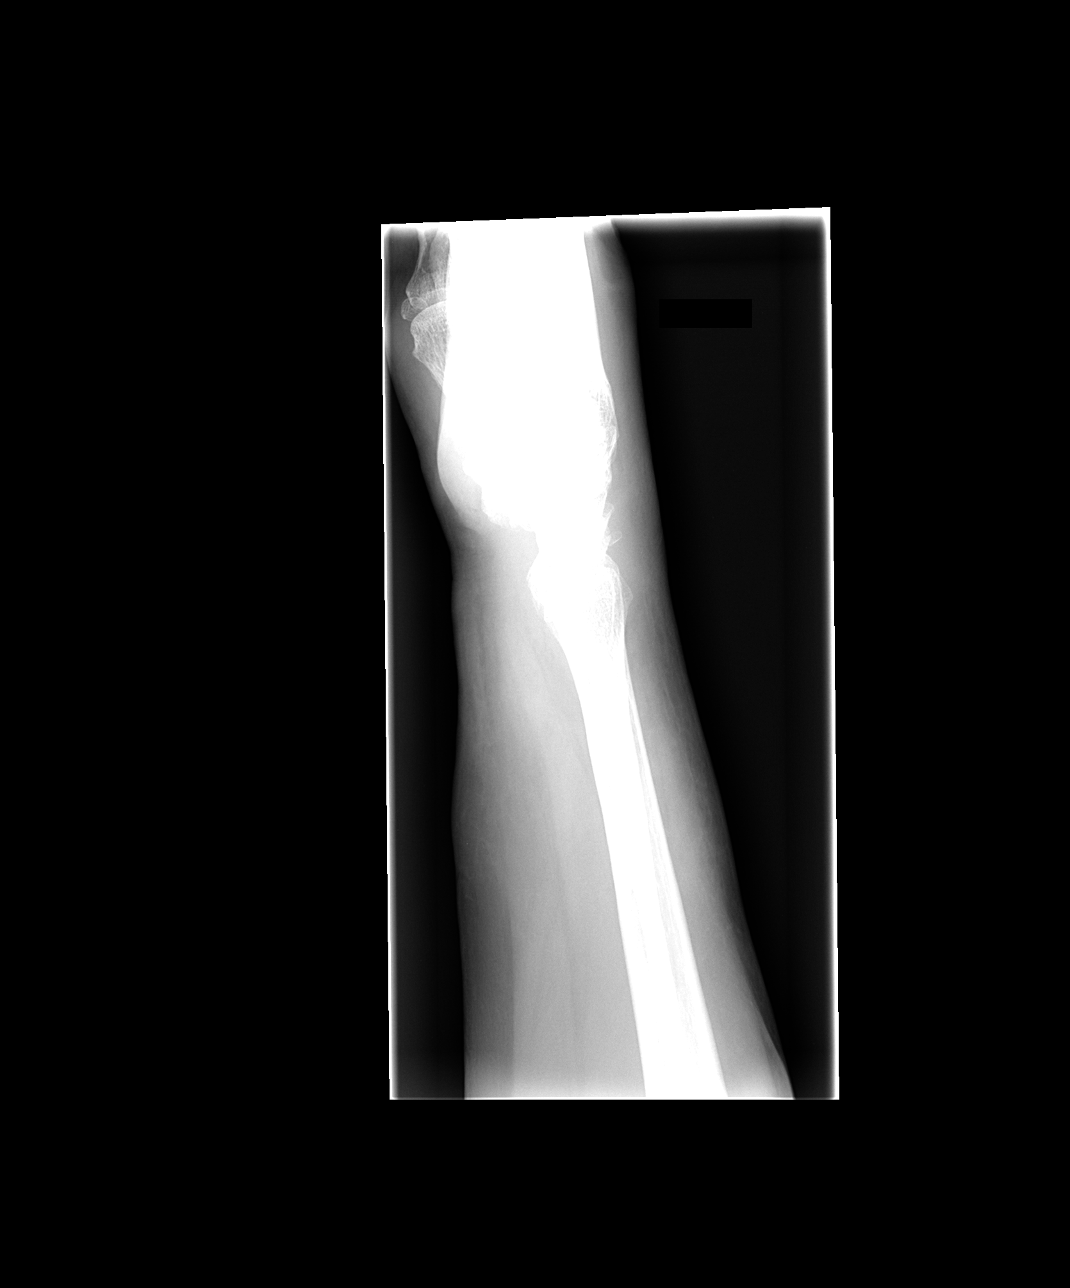

[view not recorded (4 of 4)]
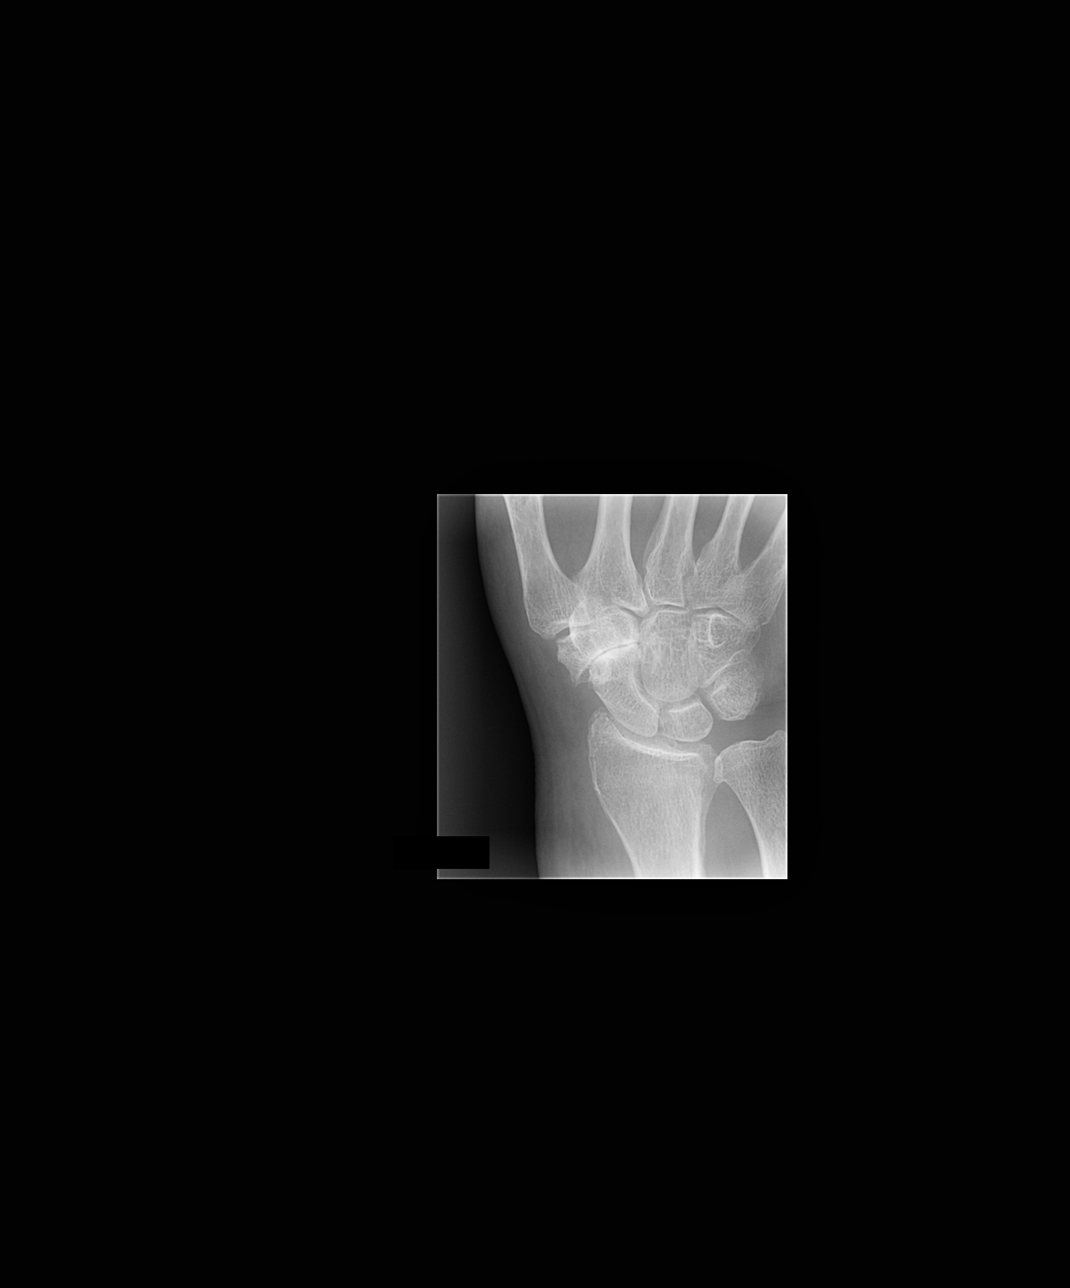

[4 of 4 positions shown; findings below may reference images not displayed]

FINDINGS: There does appear to be a nondisplaced fracture of the
radial styloid of the distal right radius.  The ulna is intact.
The carpal bones are intact although there is considerable
degenerative change at the articulation of the navicula with the
trapezium.
IMPRESSION: Suspect nondisplaced fracture of the radial styloid.

## 2013-03-19 IMAGING — CR DG HAND 2V*R*
2 series · 2 of 2 positions shown · non-contrast
Comparison: None.

CLINICAL DATA: Fell with swelling and redness

RIGHT HAND - 2 VIEW

[view not recorded (1 of 2)]
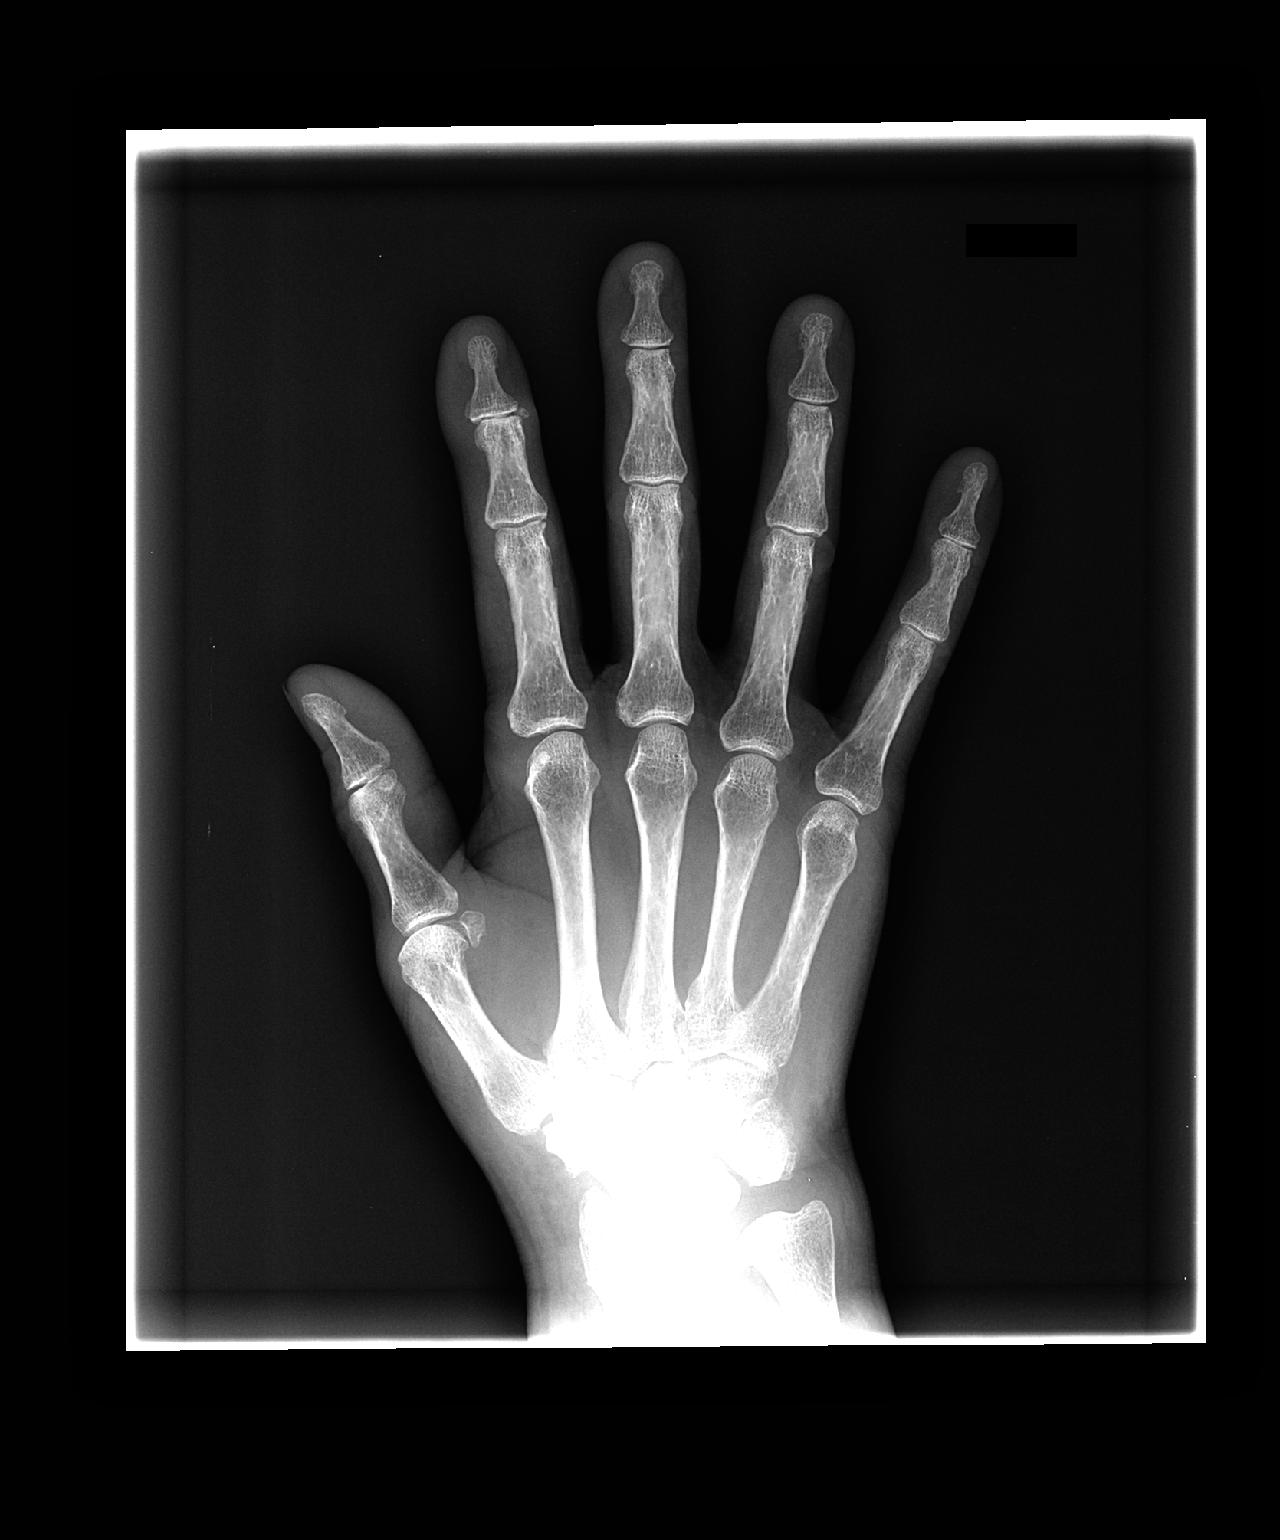

[view not recorded (2 of 2)]
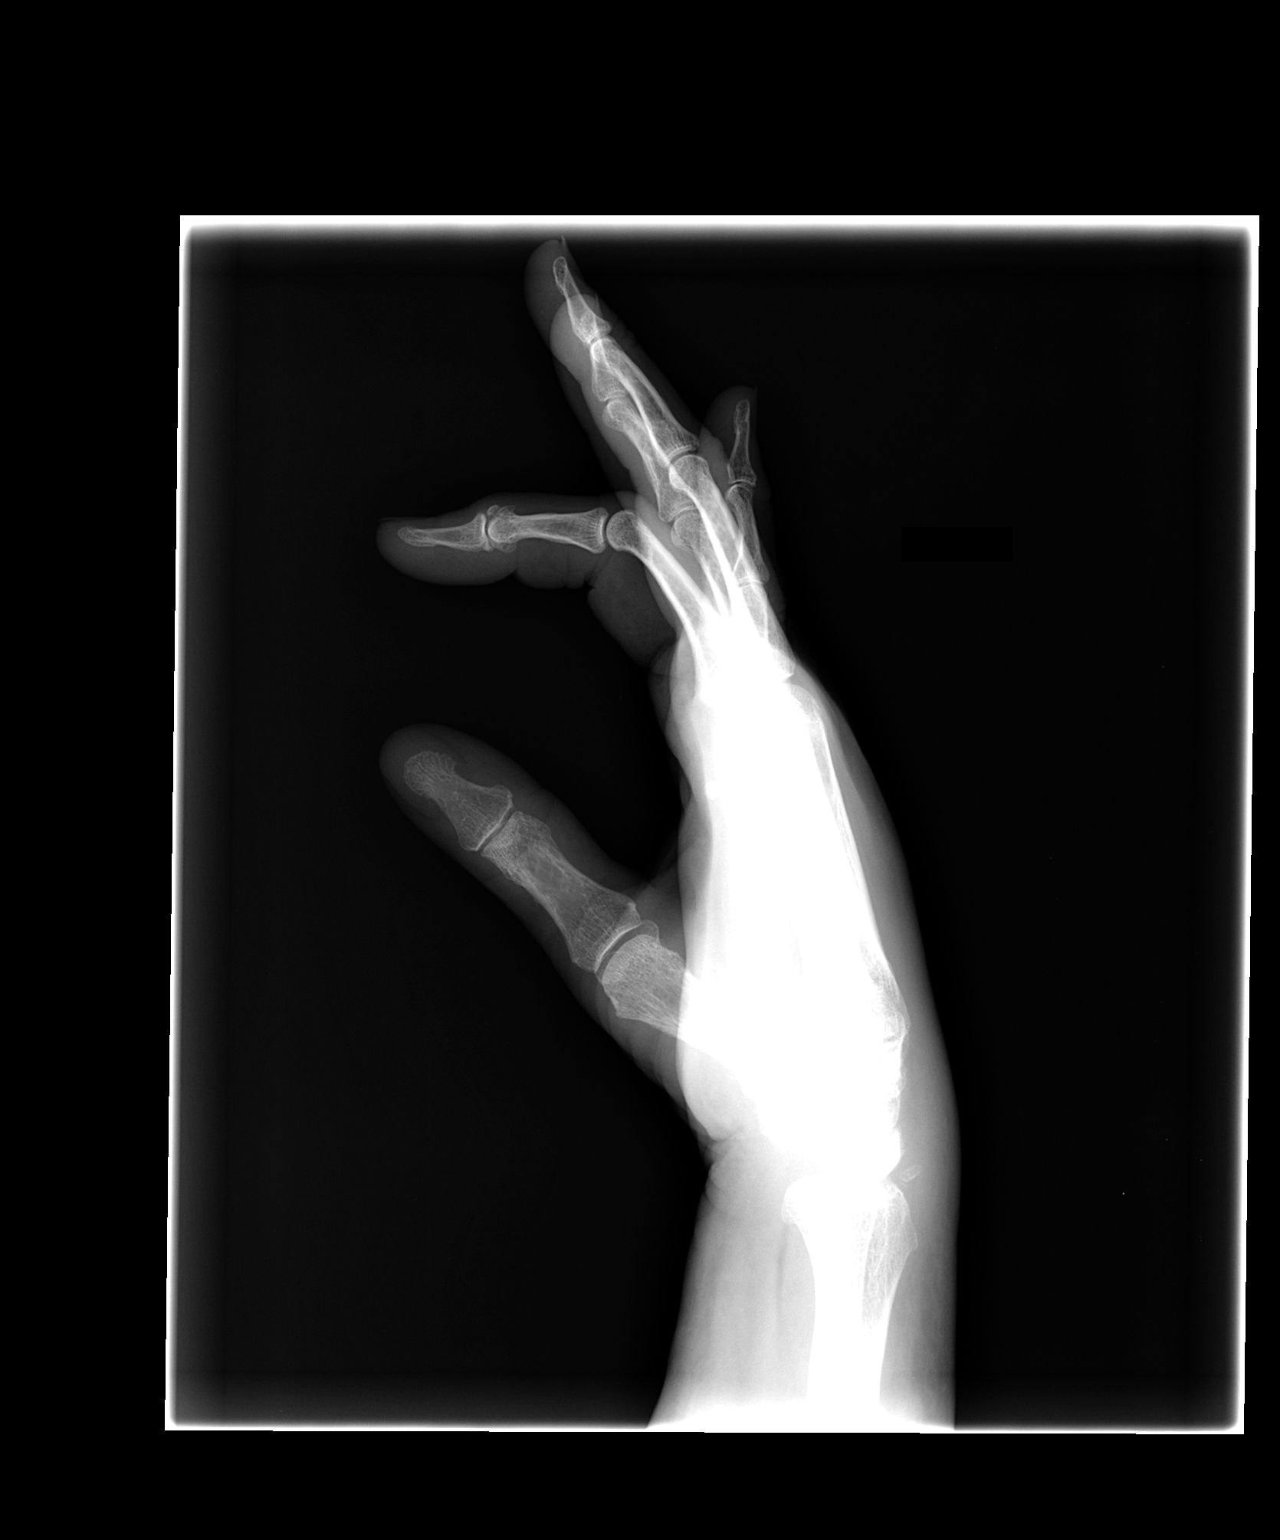

[2 of 2 positions shown; findings below may reference images not displayed]

FINDINGS: The radiocarpal joint space is within normal limits for
age.  There is considerable degenerative change at the articulation
of the navicula with the trapezium with loss of carpal space and
sclerosis with subchondral cyst formation.  No acute fracture is
seen. The remainder of joint spaces appear normal.
IMPRESSION: No acute fracture.  Degenerative change within the radial aspect of
the wrist.

## 2013-04-25 ENCOUNTER — Other Ambulatory Visit: Payer: Self-pay | Admitting: *Deleted

## 2013-04-25 MED ORDER — SERTRALINE HCL 50 MG PO TABS: ORAL_TABLET | ORAL | Status: DC

## 2013-04-27 ENCOUNTER — Ambulatory Visit (INDEPENDENT_AMBULATORY_CARE_PROVIDER_SITE_OTHER): Payer: Medicare Other | Admitting: Family Medicine

## 2013-04-27 ENCOUNTER — Encounter: Payer: Self-pay | Admitting: Family Medicine

## 2013-04-27 VITALS — BP 142/85 | HR 92 | Temp 98.0°F | Wt 140.0 lb

## 2013-04-27 DIAGNOSIS — J441 Chronic obstructive pulmonary disease with (acute) exacerbation: Secondary | ICD-10-CM

## 2013-04-27 MED ORDER — DOXYCYCLINE HYCLATE 100 MG PO TABS: ORAL_TABLET | ORAL | Status: AC

## 2013-04-27 MED ORDER — PREDNISONE 20 MG PO TABS: ORAL_TABLET | ORAL | Status: AC

## 2013-04-27 NOTE — Progress Notes (Signed)
CC: Beth [name] is a 79 y.o. female is here for Headache   Subjective: HPI:  Complains of productive cough that has been present for the last 7-8 days worsening on a daily basis seems to be most bothersome in the evening and early morning hours it is significantly interfering with her sleep. Moderate in severity overall but mild to moderate during the daytime. Slightly improved with NyQuil. No other interventions. Has not used albuterol during this illness.  Reports chest tightness not related to exertion but denies shortness of breath. Accompanied by mild nasal discharge. Denies fevers, chills, confusion, chest pain, facial pain, night sweats, lack of appetite nor joint pain   Review Of Systems Outlined In HPI  Past Medical History  Diagnosis Date  . Hypertension   . Hyperlipidemia   . Anxiety     situational  . Insomnia   . Diverticulitis   . Arthritis   . Fibromyalgia      Family History  Problem Relation Age of Onset  . Cancer Mother     breast  . Hyperlipidemia Mother   . Hypertension Mother   . Heart disease Father     MI  . Depression Father   . Hyperlipidemia Father   . Dementia Mother      History  Substance Use Topics  . Smoking status: Former Games developer  . Smokeless tobacco: Never Used  . Alcohol Use: No     Objective: Filed Vitals:   04/27/13 1617  BP: 142/85  Pulse: 92  Temp: 98 F (36.7 C)    General: Alert and Oriented, No Acute Distress HEENT: Pupils equal, round, reactive to light. Conjunctivae clear.  External ears unremarkable, canals clear with intact TMs with appropriate landmarks.  Middle ear appears open without effusion. Pink inferior turbinates.  Moist mucous membranes, pharynx without inflammation nor lesions.  Neck supple without palpable lymphadenopathy nor abnormal masses. Lungs: Trace end expiratory wheezing in all lung fields no rhonchi Rales nor signs of consolidation Cardiac: Regular rate and rhythm. Normal S1/S2.  No murmurs,  rubs, nor gallops.   Skin: Warm and dry.  Assessment & Plan: [name] was seen today for headache.  Diagnoses and associated orders for this visit:  COPD exacerbation - doxycycline (VIBRA-TABS) 100 MG tablet; One by mouth twice a day for ten days. - predniSONE (DELTASONE) 20 MG tablet; Two tabs at once daily for five days.    COPD exacerbation encouraged to continue NyQuil as needed. Encouraged to use albuterol 2 puffs every 4-6 hours for the next 48 hours and then as needed. Start doxycycline and moderate dose prednisone.  Return if symptoms worsen or fail to improve.

## 2013-05-10 ENCOUNTER — Ambulatory Visit (INDEPENDENT_AMBULATORY_CARE_PROVIDER_SITE_OTHER): Payer: Medicare Other | Admitting: Family Medicine

## 2013-05-10 ENCOUNTER — Ambulatory Visit (INDEPENDENT_AMBULATORY_CARE_PROVIDER_SITE_OTHER): Payer: Medicare Other

## 2013-05-10 ENCOUNTER — Encounter: Payer: Self-pay | Admitting: Family Medicine

## 2013-05-10 VITALS — BP 114/66 | HR 78 | Temp 97.9°F | Wt 156.0 lb

## 2013-05-10 DIAGNOSIS — J441 Chronic obstructive pulmonary disease with (acute) exacerbation: Secondary | ICD-10-CM

## 2013-05-10 DIAGNOSIS — R0602 Shortness of breath: Secondary | ICD-10-CM

## 2013-05-10 DIAGNOSIS — J984 Other disorders of lung: Secondary | ICD-10-CM

## 2013-05-10 DIAGNOSIS — M47814 Spondylosis without myelopathy or radiculopathy, thoracic region: Secondary | ICD-10-CM

## 2013-05-10 MED ORDER — ALBUTEROL SULFATE HFA 108 (90 BASE) MCG/ACT IN AERS: 2.0000 | INHALATION_SPRAY | Freq: Four times a day (QID) | RESPIRATORY_TRACT | Status: DC | PRN

## 2013-05-10 NOTE — Progress Notes (Signed)
   Subjective:    Patient ID: Beth [name], female    DOB: Oct 02, 1932, 80 y.o.   MRN: [id number]  HPI COPD exacerbation with acute bronchitis.  Could hear gurgling in her lung. Says sputum has been foamy.  Saw Dr. Ileene Rubens on 12/30 and put on prednisone and doxy. Could only tolerate 7 days of doxy and then stopped it. Says it broke her mouth out. She is feeling some better today and is sleeping better.  No fever, chills, or sweat.  Says feels a little dizzy when takes a deep breath. Has heard a gurlling in her right side of her chest.    Review of Systems     Objective:   Physical Exam  Constitutional: She is oriented to person, place, and time. She appears well-developed and well-nourished.  HENT:  Head: Normocephalic and atraumatic.  Cardiovascular: Normal rate, regular rhythm and normal heart sounds.   Pulmonary/Chest: Effort normal and breath sounds normal. She has no wheezes.  Deep bronchitic cough, sounds productive  Neurological: She is alert and oriented to person, place, and time.  Skin: Skin is warm and dry.  Psychiatric: She has a normal mood and affect. Her behavior is normal.          Assessment & Plan:  COPD exacerbation- Will get CXR today.  Will call with results. Use albuterol every 4-6 hours, liberally. Call if not improving by the end of the week. She actually does so some better today so I'm hoping that she is on the mend. She still is coughing excessively in the room but was afebrile and oxygen level was stable.

## 2013-05-27 ENCOUNTER — Other Ambulatory Visit: Payer: Self-pay | Admitting: *Deleted

## 2013-05-27 MED ORDER — SERTRALINE HCL 50 MG PO TABS: ORAL_TABLET | ORAL | Status: DC

## 2013-06-30 ENCOUNTER — Other Ambulatory Visit: Payer: Self-pay | Admitting: *Deleted

## 2013-06-30 MED ORDER — SERTRALINE HCL 50 MG PO TABS: ORAL_TABLET | ORAL | Status: DC

## 2013-07-27 ENCOUNTER — Other Ambulatory Visit: Payer: Self-pay | Admitting: *Deleted

## 2013-07-28 ENCOUNTER — Other Ambulatory Visit: Payer: Self-pay | Admitting: *Deleted

## 2013-07-28 MED ORDER — SERTRALINE HCL 50 MG PO TABS: ORAL_TABLET | ORAL | Status: DC

## 2013-08-24 ENCOUNTER — Other Ambulatory Visit: Payer: Self-pay | Admitting: Family Medicine

## 2013-09-06 ENCOUNTER — Encounter: Payer: Self-pay | Admitting: Family Medicine

## 2013-09-06 ENCOUNTER — Ambulatory Visit (INDEPENDENT_AMBULATORY_CARE_PROVIDER_SITE_OTHER): Payer: Medicare Other | Admitting: Family Medicine

## 2013-09-06 VITALS — BP 131/76 | HR 74 | Ht 63.0 in | Wt 156.0 lb

## 2013-09-06 DIAGNOSIS — L853 Xerosis cutis: Secondary | ICD-10-CM

## 2013-09-06 DIAGNOSIS — R51 Headache: Secondary | ICD-10-CM

## 2013-09-06 DIAGNOSIS — R5381 Other malaise: Secondary | ICD-10-CM

## 2013-09-06 DIAGNOSIS — G4700 Insomnia, unspecified: Secondary | ICD-10-CM

## 2013-09-06 DIAGNOSIS — L738 Other specified follicular disorders: Secondary | ICD-10-CM

## 2013-09-06 DIAGNOSIS — E739 Lactose intolerance, unspecified: Secondary | ICD-10-CM

## 2013-09-06 DIAGNOSIS — R5383 Other fatigue: Secondary | ICD-10-CM

## 2013-09-06 DIAGNOSIS — R6889 Other general symptoms and signs: Secondary | ICD-10-CM

## 2013-09-06 MED ORDER — ZOLPIDEM TARTRATE 5 MG PO TABS: 2.5000 mg | ORAL_TABLET | Freq: Every evening | ORAL | Status: DC | PRN

## 2013-09-06 MED ORDER — AMOXICILLIN-POT CLAVULANATE 875-125 MG PO TABS: 1.0000 | ORAL_TABLET | Freq: Two times a day (BID) | ORAL | Status: DC

## 2013-09-06 NOTE — Progress Notes (Signed)
   Subjective:    Patient ID: Beth [name], female    DOB: 11-14-1932, 80 y.o.   MRN: [id number]  HPI Has frequent headaches that are dull in the frontal region.  Has been worse the last 2 weeks.  Is better today. Does use claritin daily but doesn't seem to help.  No vision change. No sig sinus congestion.  Occ gers sharp pains in her body that last a few seconds. Has a bitter taste in the back of her mouth at night.  Has been feeling tired.  Has had some cold intolerance.  Skin has been more dry.   Having nightsweats and not sleeping well. Still till almost 3 AM before falls asleep.    Has had some back pain. Has been more bloated than usual.  Say sbetter some.  Has been using gas-x. No dysuria.  Says has been drinking plenty of water.  Does have constipation usually.     Insomnia - still not sleeping well.  Says has had really low enegy.  Takes several hours to fall alseep. Using melatonin.  Trazodone made her hyper. Benadryl didn't help.   Hypertension- Pt denies chest pain, SOB, dizziness, or heart palpitations.  Taking meds as directed w/o problems.  Denies medication side effects.  She says her sister takes Ambien it works well for her.  Review of Systems     Objective:   Physical Exam  Constitutional: She is oriented to person, place, and time. She appears well-developed and well-nourished.  HENT:  Head: Normocephalic and atraumatic.  Right Ear: External ear normal.  Left Ear: External ear normal.  Nose: Nose normal.  Mouth/Throat: Oropharynx is clear and moist.  TMs and canals are clear.   Eyes: Conjunctivae and EOM are normal. Pupils are equal, round, and reactive to light.  Neck: Neck supple. No thyromegaly present.  Cardiovascular: Normal rate, regular rhythm and normal heart sounds.   Pulmonary/Chest: Effort normal and breath sounds normal. She has no wheezes.  Abdominal: Soft. Bowel sounds are normal. She exhibits no distension and no mass. There is no tenderness. There is  no rebound and no guarding.  Lymphadenopathy:    She has no cervical adenopathy.  Neurological: She is alert and oriented to person, place, and time.  Skin: Skin is warm and dry.  Psychiatric: She has a normal mood and affect. Her behavior is normal.          Assessment & Plan:  Insomnia- Will try ambien. Start with 2.5mg  daily and increase to 5mg  if needed.  No response to Benadryl, melatonin, and had increased hyperactivity on trazodone. She is to stop the medication if it causes excess sedation or any type of sleep walking et Ronney Asters.  Headache - suspect could be related to the subacute sinusitis. Will tx with augmentin, if not better in 1-2 weeks then let me know.    HTN - well controlled. F/IU on 6 month.   Fatigue - will check thyroid since she has also had dry skin and some cold intolerance.

## 2013-09-06 NOTE — Patient Instructions (Addendum)
Try miralax once a day.  Can increase to twice a day if needed.  Try the Ambien for sleep. Start with half a tab at bedtime.

## 2013-09-07 LAB — TSH: TSH: 1.962 u[IU]/mL (ref 0.350–4.500)

## 2013-09-07 LAB — COMPLETE METABOLIC PANEL WITH GFR
ALT: 12 U/L (ref 0–35)
AST: 16 U/L (ref 0–37)
Albumin: 4.2 g/dL (ref 3.5–5.2)
Alkaline Phosphatase: 73 U/L (ref 39–117)
BILIRUBIN TOTAL: 0.4 mg/dL (ref 0.2–1.2)
BUN: 10 mg/dL (ref 6–23)
CALCIUM: 9.8 mg/dL (ref 8.4–10.5)
CHLORIDE: 102 meq/L (ref 96–112)
CO2: 27 meq/L (ref 19–32)
Creat: 0.64 mg/dL (ref 0.50–1.10)
GFR, EST NON AFRICAN AMERICAN: 85 mL/min
GFR, Est African American: >89 mL/min
GLUCOSE: 124 mg/dL — AB (ref 70–99)
POTASSIUM: 4.4 meq/L (ref 3.5–5.3)
Sodium: 139 mEq/L (ref 135–145)
TOTAL PROTEIN: 6.4 g/dL (ref 6.0–8.3)

## 2013-09-07 LAB — CBC WITH DIFFERENTIAL/PLATELET
Basophils Absolute: 0.1 10*3/uL (ref 0.0–0.1)
Basophils Relative: 1 % (ref 0–1)
EOS ABS: 0.3 10*3/uL (ref 0.0–0.7)
EOS PCT: 5 % (ref 0–5)
HEMATOCRIT: 39.3 % (ref 36.0–46.0)
Hemoglobin: 13 g/dL (ref 12.0–15.0)
Lymphocytes Relative: 25 % (ref 12–46)
Lymphs Abs: 1.4 10*3/uL (ref 0.7–4.0)
MCH: 29.1 pg (ref 26.0–34.0)
MCHC: 33.1 g/dL (ref 30.0–36.0)
MCV: 87.9 fL (ref 78.0–100.0)
Monocytes Absolute: 0.4 10*3/uL (ref 0.1–1.0)
Monocytes Relative: 7 % (ref 3–12)
Neutro Abs: 3.5 10*3/uL (ref 1.7–7.7)
Neutrophils Relative %: 62 % (ref 43–77)
PLATELETS: 242 10*3/uL (ref 150–400)
RBC: 4.47 MIL/uL (ref 3.87–5.11)
RDW: 14.1 % (ref 11.5–15.5)
WBC: 5.6 10*3/uL (ref 4.0–10.5)

## 2013-09-07 LAB — T3, FREE: T3, Free: 2.9 pg/mL (ref 2.3–4.2)

## 2013-09-07 LAB — T4, FREE: FREE T4: 0.82 ng/dL (ref 0.80–1.80)

## 2013-09-07 NOTE — Progress Notes (Signed)
Quick Note:  All labs are normal. ______ 

## 2013-10-02 ENCOUNTER — Other Ambulatory Visit: Payer: Self-pay | Admitting: Family Medicine

## 2013-11-04 ENCOUNTER — Other Ambulatory Visit: Payer: Self-pay | Admitting: Family Medicine

## 2013-12-11 ENCOUNTER — Other Ambulatory Visit: Payer: Self-pay | Admitting: Family Medicine

## 2013-12-22 ENCOUNTER — Other Ambulatory Visit: Payer: Self-pay | Admitting: Family Medicine

## 2013-12-22 ENCOUNTER — Encounter: Payer: Self-pay | Admitting: Family Medicine

## 2013-12-22 ENCOUNTER — Ambulatory Visit (INDEPENDENT_AMBULATORY_CARE_PROVIDER_SITE_OTHER): Payer: Medicare Other | Admitting: Family Medicine

## 2013-12-22 VITALS — BP 124/75 | HR 73 | Wt 155.0 lb

## 2013-12-22 DIAGNOSIS — N9089 Other specified noninflammatory disorders of vulva and perineum: Secondary | ICD-10-CM

## 2013-12-22 DIAGNOSIS — N898 Other specified noninflammatory disorders of vagina: Secondary | ICD-10-CM

## 2013-12-22 DIAGNOSIS — R319 Hematuria, unspecified: Secondary | ICD-10-CM

## 2013-12-22 LAB — POCT URINALYSIS DIPSTICK
BILIRUBIN UA: NEGATIVE
Blood, UA: NEGATIVE
GLUCOSE UA: NEGATIVE
Nitrite, UA: NEGATIVE
Protein, UA: NEGATIVE
Spec Grav, UA: 1.015
UROBILINOGEN UA: 0.2
pH, UA: 6

## 2013-12-22 LAB — WET PREP FOR TRICH, YEAST, CLUE
Clue Cells Wet Prep HPF POC: NONE SEEN
Trich, Wet Prep: NONE SEEN
Yeast Wet Prep HPF POC: NONE SEEN

## 2013-12-22 NOTE — Progress Notes (Signed)
   Subjective:    Patient ID: Beth [name], female    DOB: 1933-04-24, 80 y.o.   MRN: [id number]  Urinary Tract Infection    80 yo WF with hx of bladder sling.  She has had urinary sxs x 1 week. Noticed some dark blood on the toilet tissue after wiping her urethra/vaginal area.  No fever, chills, or sweats.  Some vaginal d/c.  No flank pain.  Some exteranal irritation. She does have an extra flap of skin at the external vagina and thinks that is where the blood may have come from.   She started using Monistat and that does help. She says it has helped relieve the discomfort and irritation.   Review of Systems     Objective:   Physical Exam  Constitutional: She is oriented to person, place, and time. She appears well-developed and well-nourished.  HENT:  Head: Normocephalic and atraumatic.  Cardiovascular: Normal rate, regular rhythm and normal heart sounds.   Pulmonary/Chest: Effort normal and breath sounds normal.  Abdominal: Soft. Bowel sounds are normal. She exhibits no distension and no mass. There is no tenderness. There is no rebound and no guarding.  Genitourinary:    No erythema, tenderness or bleeding around the vagina. No foreign body around the vagina. No signs of injury around the vagina. No vaginal discharge found.  She does have a flap of redundant tissue at the 5:00 position at the vaginal introitus. It does look a little inflamed and irritated but no active bleeding. We did not do a speculum exam today. External rectum appears normal.  Musculoskeletal:  No CVA tenderness   Neurological: She is alert and oriented to person, place, and time.  Skin: Skin is warm and dry.  Psychiatric: She has a normal mood and affect. Her behavior is normal.          Assessment & Plan:  Perineal bleeding - unclear etiology at this point. UA + for trace leuk. Will send for culture.  Will sent wet prep as well. It may be negative but she is Re: used Monistat and has been using  Vagisil as well. She does have an appointment in about 2 weeks with her surgeon to check her bladder sling.

## 2013-12-23 ENCOUNTER — Other Ambulatory Visit: Payer: Self-pay | Admitting: Family Medicine

## 2013-12-23 MED ORDER — ESTRADIOL 0.1 MG/GM VA CREA: 1.0000 | TOPICAL_CREAM | VAGINAL | Status: DC

## 2013-12-25 LAB — URINE CULTURE

## 2013-12-26 ENCOUNTER — Other Ambulatory Visit: Payer: Self-pay | Admitting: Family Medicine

## 2013-12-26 MED ORDER — LEVOFLOXACIN 250 MG PO TABS
250.0000 mg | ORAL_TABLET | Freq: Every day | ORAL | Status: AC
Start: 2013-12-26 — End: 2014-01-05

## 2014-01-10 ENCOUNTER — Other Ambulatory Visit: Payer: Self-pay | Admitting: Family Medicine

## 2014-01-15 ENCOUNTER — Encounter (HOSPITAL_COMMUNITY): Payer: Self-pay | Admitting: Emergency Medicine

## 2014-01-15 DIAGNOSIS — J189 Pneumonia, unspecified organism: Secondary | ICD-10-CM | POA: Diagnosis not present

## 2014-01-15 DIAGNOSIS — Z87891 Personal history of nicotine dependence: Secondary | ICD-10-CM

## 2014-01-15 DIAGNOSIS — G4700 Insomnia, unspecified: Secondary | ICD-10-CM | POA: Diagnosis present

## 2014-01-15 DIAGNOSIS — K5900 Constipation, unspecified: Secondary | ICD-10-CM | POA: Diagnosis present

## 2014-01-15 DIAGNOSIS — R05 Cough: Secondary | ICD-10-CM | POA: Diagnosis not present

## 2014-01-15 DIAGNOSIS — J449 Chronic obstructive pulmonary disease, unspecified: Secondary | ICD-10-CM | POA: Diagnosis present

## 2014-01-15 DIAGNOSIS — R059 Cough, unspecified: Secondary | ICD-10-CM | POA: Diagnosis not present

## 2014-01-15 DIAGNOSIS — J4489 Other specified chronic obstructive pulmonary disease: Secondary | ICD-10-CM | POA: Diagnosis present

## 2014-01-15 DIAGNOSIS — Z79899 Other long term (current) drug therapy: Secondary | ICD-10-CM

## 2014-01-15 DIAGNOSIS — E785 Hyperlipidemia, unspecified: Secondary | ICD-10-CM | POA: Diagnosis present

## 2014-01-15 DIAGNOSIS — F411 Generalized anxiety disorder: Secondary | ICD-10-CM | POA: Diagnosis present

## 2014-01-15 DIAGNOSIS — I10 Essential (primary) hypertension: Secondary | ICD-10-CM | POA: Diagnosis present

## 2014-01-15 NOTE — ED Notes (Signed)
The pt is c/o epigastric pain  Since yesterday with diarrhea .  She has not felt well all week elevated temp earlier tonight.

## 2014-01-16 ENCOUNTER — Emergency Department (HOSPITAL_COMMUNITY): Payer: Medicare Other

## 2014-01-16 ENCOUNTER — Encounter (HOSPITAL_COMMUNITY): Payer: Self-pay | Admitting: Radiology

## 2014-01-16 ENCOUNTER — Inpatient Hospital Stay (HOSPITAL_COMMUNITY)
Admission: EM | Admit: 2014-01-16 | Discharge: 2014-01-18 | DRG: 195 | Disposition: A | Discharge status: Home / Self Care | Payer: Medicare Other | Attending: Internal Medicine | Admitting: Internal Medicine

## 2014-01-16 DIAGNOSIS — R51 Headache: Secondary | ICD-10-CM

## 2014-01-16 DIAGNOSIS — G4700 Insomnia, unspecified: Secondary | ICD-10-CM

## 2014-01-16 DIAGNOSIS — E785 Hyperlipidemia, unspecified: Secondary | ICD-10-CM | POA: Diagnosis present

## 2014-01-16 DIAGNOSIS — Z79899 Other long term (current) drug therapy: Secondary | ICD-10-CM | POA: Diagnosis not present

## 2014-01-16 DIAGNOSIS — R059 Cough, unspecified: Secondary | ICD-10-CM | POA: Diagnosis present

## 2014-01-16 DIAGNOSIS — Z87891 Personal history of nicotine dependence: Secondary | ICD-10-CM | POA: Diagnosis not present

## 2014-01-16 DIAGNOSIS — K5900 Constipation, unspecified: Secondary | ICD-10-CM | POA: Diagnosis present

## 2014-01-16 DIAGNOSIS — J189 Pneumonia, unspecified organism: Secondary | ICD-10-CM | POA: Diagnosis present

## 2014-01-16 DIAGNOSIS — R05 Cough: Secondary | ICD-10-CM | POA: Diagnosis present

## 2014-01-16 DIAGNOSIS — R519 Headache, unspecified: Secondary | ICD-10-CM

## 2014-01-16 DIAGNOSIS — J449 Chronic obstructive pulmonary disease, unspecified: Secondary | ICD-10-CM | POA: Diagnosis present

## 2014-01-16 DIAGNOSIS — J42 Unspecified chronic bronchitis: Secondary | ICD-10-CM

## 2014-01-16 DIAGNOSIS — I10 Essential (primary) hypertension: Secondary | ICD-10-CM | POA: Diagnosis present

## 2014-01-16 DIAGNOSIS — F411 Generalized anxiety disorder: Secondary | ICD-10-CM | POA: Diagnosis present

## 2014-01-16 HISTORY — DX: Unspecified asthma, uncomplicated: J45.909

## 2014-01-16 HISTORY — DX: Chronic obstructive pulmonary disease, unspecified: J44.9

## 2014-01-16 LAB — CBC WITH DIFFERENTIAL/PLATELET
Basophils Absolute: 0 10*3/uL (ref 0.0–0.1)
Basophils Relative: 0 % (ref 0–1)
Eosinophils Absolute: 0.3 10*3/uL (ref 0.0–0.7)
Eosinophils Relative: 2 % (ref 0–5)
HCT: 40.3 % (ref 36.0–46.0)
HEMOGLOBIN: 13.5 g/dL (ref 12.0–15.0)
LYMPHS ABS: 0.8 10*3/uL (ref 0.7–4.0)
LYMPHS PCT: 6 % — AB (ref 12–46)
MCH: 30.7 pg (ref 26.0–34.0)
MCHC: 33.5 g/dL (ref 30.0–36.0)
MCV: 91.6 fL (ref 78.0–100.0)
MONOS PCT: 7 % (ref 3–12)
Monocytes Absolute: 0.8 10*3/uL (ref 0.1–1.0)
NEUTROS ABS: 10.4 10*3/uL — AB (ref 1.7–7.7)
Neutrophils Relative %: 85 % — ABNORMAL HIGH (ref 43–77)
Platelets: 213 10*3/uL (ref 150–400)
RBC: 4.4 MIL/uL (ref 3.87–5.11)
RDW: 13.6 % (ref 11.5–15.5)
WBC: 12.3 10*3/uL — ABNORMAL HIGH (ref 4.0–10.5)

## 2014-01-16 LAB — EXPECTORATED SPUTUM ASSESSMENT W REFEX TO RESP CULTURE

## 2014-01-16 LAB — STREP PNEUMONIAE URINARY ANTIGEN: Strep Pneumo Urinary Antigen: NEGATIVE

## 2014-01-16 LAB — URINALYSIS, ROUTINE W REFLEX MICROSCOPIC
BILIRUBIN URINE: NEGATIVE
Glucose, UA: NEGATIVE mg/dL
HGB URINE DIPSTICK: NEGATIVE
Ketones, ur: NEGATIVE mg/dL
Leukocytes, UA: NEGATIVE
Nitrite: NEGATIVE
Protein, ur: NEGATIVE mg/dL
SPECIFIC GRAVITY, URINE: 1.011 (ref 1.005–1.030)
Urobilinogen, UA: 0.2 mg/dL (ref 0.0–1.0)
pH: 6.5 (ref 5.0–8.0)

## 2014-01-16 LAB — COMPREHENSIVE METABOLIC PANEL
ALK PHOS: 81 U/L (ref 39–117)
ALT: 13 U/L (ref 0–35)
AST: 19 U/L (ref 0–37)
Albumin: 3.7 g/dL (ref 3.5–5.2)
Anion gap: 12 (ref 5–15)
BUN: 10 mg/dL (ref 6–23)
CHLORIDE: 99 meq/L (ref 96–112)
CO2: 23 mEq/L (ref 19–32)
Calcium: 9.3 mg/dL (ref 8.4–10.5)
Creatinine, Ser: 0.61 mg/dL (ref 0.50–1.10)
GFR calc non Af Amer: 84 mL/min — ABNORMAL LOW (ref >90)
GLUCOSE: 129 mg/dL — AB (ref 70–99)
POTASSIUM: 4.3 meq/L (ref 3.7–5.3)
Sodium: 134 mEq/L — ABNORMAL LOW (ref 137–147)
Total Bilirubin: 0.4 mg/dL (ref 0.3–1.2)
Total Protein: 7.1 g/dL (ref 6.0–8.3)

## 2014-01-16 LAB — LIPASE, BLOOD: Lipase: 17 U/L (ref 11–59)

## 2014-01-16 LAB — EXPECTORATED SPUTUM ASSESSMENT W GRAM STAIN, RFLX TO RESP C

## 2014-01-16 LAB — I-STAT TROPONIN, ED: TROPONIN I, POC: 0 ng/mL (ref 0.00–0.08)

## 2014-01-16 LAB — HIV ANTIBODY (ROUTINE TESTING W REFLEX): HIV 1&2 Ab, 4th Generation: NONREACTIVE

## 2014-01-16 MED ORDER — IPRATROPIUM-ALBUTEROL 0.5-2.5 (3) MG/3ML IN SOLN
3.0000 mL | Freq: Two times a day (BID) | RESPIRATORY_TRACT | Status: DC
  Administered 2014-01-17 (×2): 3 mL via RESPIRATORY_TRACT
  Filled 2014-01-16 (×3): qty 3

## 2014-01-16 MED ORDER — IPRATROPIUM-ALBUTEROL 0.5-2.5 (3) MG/3ML IN SOLN: 3.0000 mL | RESPIRATORY_TRACT | Status: DC | PRN

## 2014-01-16 MED ORDER — ACETAMINOPHEN 325 MG PO TABS
650.0000 mg | ORAL_TABLET | Freq: Four times a day (QID) | ORAL | Status: DC | PRN
  Administered 2014-01-16 – 2014-01-17 (×5): 650 mg via ORAL
  Filled 2014-01-16 (×6): qty 2

## 2014-01-16 MED ORDER — ESTRADIOL 0.1 MG/GM VA CREA
1.0000 | TOPICAL_CREAM | VAGINAL | Status: DC
  Filled 2014-01-16: qty 42.5

## 2014-01-16 MED ORDER — SODIUM CHLORIDE 0.9 % IV BOLUS (SEPSIS)
500.0000 mL | Freq: Once | INTRAVENOUS | Status: AC
  Administered 2014-01-16: 500 mL via INTRAVENOUS

## 2014-01-16 MED ORDER — IOHEXOL 300 MG/ML  SOLN: 25.0000 mL | INTRAMUSCULAR | Status: AC

## 2014-01-16 MED ORDER — HEPARIN SODIUM (PORCINE) 5000 UNIT/ML IJ SOLN
5000.0000 [IU] | Freq: Three times a day (TID) | INTRAMUSCULAR | Status: DC
  Administered 2014-01-16 – 2014-01-18 (×6): 5000 [IU] via SUBCUTANEOUS
  Filled 2014-01-16 (×9): qty 1

## 2014-01-16 MED ORDER — PANTOPRAZOLE SODIUM 40 MG PO TBEC
40.0000 mg | DELAYED_RELEASE_TABLET | Freq: Every day | ORAL | Status: DC
  Administered 2014-01-16 – 2014-01-18 (×3): 40 mg via ORAL
  Filled 2014-01-16 (×3): qty 1

## 2014-01-16 MED ORDER — DEXTROSE 5 % IV SOLN
500.0000 mg | INTRAVENOUS | Status: DC
  Administered 2014-01-16 – 2014-01-17 (×2): 500 mg via INTRAVENOUS
  Filled 2014-01-16 (×3): qty 500

## 2014-01-16 MED ORDER — VITAMIN B-12 1000 MCG PO TABS
1000.0000 ug | ORAL_TABLET | Freq: Every day | ORAL | Status: DC
  Administered 2014-01-16 – 2014-01-18 (×3): 1000 ug via ORAL
  Filled 2014-01-16 (×3): qty 1

## 2014-01-16 MED ORDER — METOPROLOL SUCCINATE ER 25 MG PO TB24
25.0000 mg | ORAL_TABLET | Freq: Every day | ORAL | Status: DC
  Administered 2014-01-16 – 2014-01-17 (×2): 25 mg via ORAL
  Filled 2014-01-16 (×3): qty 1

## 2014-01-16 MED ORDER — IPRATROPIUM-ALBUTEROL 0.5-2.5 (3) MG/3ML IN SOLN
3.0000 mL | Freq: Four times a day (QID) | RESPIRATORY_TRACT | Status: DC
  Administered 2014-01-16 (×3): 3 mL via RESPIRATORY_TRACT
  Filled 2014-01-16 (×3): qty 3

## 2014-01-16 MED ORDER — CALCIUM CARBONATE 1250 (500 CA) MG PO TABS
1250.0000 mg | ORAL_TABLET | Freq: Two times a day (BID) | ORAL | Status: DC
  Administered 2014-01-17 (×2): 1250 mg via ORAL
  Filled 2014-01-16 (×7): qty 1

## 2014-01-16 MED ORDER — ALBUTEROL SULFATE HFA 108 (90 BASE) MCG/ACT IN AERS: 2.0000 | INHALATION_SPRAY | Freq: Four times a day (QID) | RESPIRATORY_TRACT | Status: DC | PRN

## 2014-01-16 MED ORDER — POLYETHYLENE GLYCOL 3350 17 G PO PACK
17.0000 g | PACK | Freq: Every day | ORAL | Status: DC
  Administered 2014-01-16 – 2014-01-17 (×2): 17 g via ORAL
  Filled 2014-01-16 (×3): qty 1

## 2014-01-16 MED ORDER — VITAMIN B-6 100 MG PO TABS
500.0000 mg | ORAL_TABLET | Freq: Every day | ORAL | Status: DC
  Administered 2014-01-16 – 2014-01-17 (×2): 500 mg via ORAL
  Filled 2014-01-16 (×3): qty 5

## 2014-01-16 MED ORDER — SODIUM CHLORIDE 0.9 % IV SOLN
Freq: Once | INTRAVENOUS | Status: AC
  Administered 2014-01-16: 04:00:00 via INTRAVENOUS

## 2014-01-16 MED ORDER — DEXTROMETHORPHAN POLISTIREX 30 MG/5ML PO LQCR
30.0000 mg | Freq: Two times a day (BID) | ORAL | Status: DC | PRN
  Administered 2014-01-18: 30 mg via ORAL
  Filled 2014-01-16 (×2): qty 5

## 2014-01-16 MED ORDER — DEXTROSE 5 % IV SOLN
1.0000 g | INTRAVENOUS | Status: DC
  Administered 2014-01-16 – 2014-01-17 (×2): 1 g via INTRAVENOUS
  Filled 2014-01-16 (×2): qty 10

## 2014-01-16 MED ORDER — GUAIFENESIN ER 600 MG PO TB12
1200.0000 mg | ORAL_TABLET | Freq: Two times a day (BID) | ORAL | Status: DC
  Administered 2014-01-16 – 2014-01-18 (×4): 1200 mg via ORAL
  Filled 2014-01-16 (×8): qty 2

## 2014-01-16 MED ORDER — ZOLPIDEM TARTRATE 5 MG PO TABS
5.0000 mg | ORAL_TABLET | Freq: Every evening | ORAL | Status: DC | PRN
  Administered 2014-01-17: 5 mg via ORAL
  Filled 2014-01-16 (×2): qty 1

## 2014-01-16 MED ORDER — VITAMIN D3 25 MCG (1000 UNIT) PO TABS
1000.0000 [IU] | ORAL_TABLET | Freq: Every day | ORAL | Status: DC
  Administered 2014-01-16 – 2014-01-18 (×3): 1000 [IU] via ORAL
  Filled 2014-01-16 (×3): qty 1

## 2014-01-16 MED ORDER — SERTRALINE HCL 50 MG PO TABS
50.0000 mg | ORAL_TABLET | Freq: Every day | ORAL | Status: DC
  Administered 2014-01-16 – 2014-01-18 (×3): 50 mg via ORAL
  Filled 2014-01-16 (×3): qty 1

## 2014-01-16 MED ORDER — ONDANSETRON HCL 4 MG/2ML IJ SOLN
4.0000 mg | Freq: Once | INTRAMUSCULAR | Status: AC
  Administered 2014-01-16: 4 mg via INTRAVENOUS
  Filled 2014-01-16: qty 2

## 2014-01-16 MED ORDER — IOHEXOL 300 MG/ML  SOLN
100.0000 mL | Freq: Once | INTRAMUSCULAR | Status: AC | PRN
  Administered 2014-01-16: 100 mL via INTRAVENOUS

## 2014-01-16 MED ORDER — CETYLPYRIDINIUM CHLORIDE 0.05 % MT LIQD
7.0000 mL | Freq: Two times a day (BID) | OROMUCOSAL | Status: DC
  Administered 2014-01-16 – 2014-01-17 (×2): 7 mL via OROMUCOSAL

## 2014-01-16 MED ORDER — ALBUTEROL SULFATE (2.5 MG/3ML) 0.083% IN NEBU: 2.5000 mg | INHALATION_SOLUTION | Freq: Four times a day (QID) | RESPIRATORY_TRACT | Status: DC | PRN

## 2014-01-16 MED ORDER — LEVOFLOXACIN IN D5W 750 MG/150ML IV SOLN
750.0000 mg | Freq: Once | INTRAVENOUS | Status: AC
  Administered 2014-01-16: 750 mg via INTRAVENOUS
  Filled 2014-01-16: qty 150

## 2014-01-16 MED ORDER — SODIUM CHLORIDE 0.9 % IV SOLN
INTRAVENOUS | Status: DC
  Administered 2014-01-16: 07:00:00 via INTRAVENOUS

## 2014-01-16 NOTE — Progress Notes (Signed)
NURSING PROGRESS NOTE  LAQUITTA DOMINSKI [id number] Admission Data: 01/16/2014 6:42 AM Attending Provider: Etta Quill, DO POE:[id number], MD Code Status: Full  AURILLA COULIBALY is a 80 y.o. female patient admitted from ED:  -No acute distress noted.  -No complaints of shortness of breath.  -No complaints of chest pain.   Cardiac Monitoring:   Blood pressure 136/70, pulse 92, temperature 98.4 F (36.9 C), temperature source Oral, resp. rate 17, height 5' 7.2" (1.707 m), weight 70.035 kg (154 lb 6.4 oz), SpO2 98.00%.   IV Fluids:  IV in place, occlusive dsg intact without redness, IV cath antecubital left, condition patent and no redness normal saline.   Allergies:  Doxycycline; Pravastatin; Simvastatin; and Trazodone and nefazodone  Past Medical History:   has a past medical history of Hypertension; Hyperlipidemia; Anxiety; Insomnia; Diverticulitis; Arthritis; Fibromyalgia; Asthma; and COPD (chronic obstructive pulmonary disease).  Past Surgical History:   has past surgical history that includes Abdominal hysterectomy; Bladder surgery; and Colonoscopy.  Social History:   reports that she has quit smoking. She has never used smokeless tobacco. She reports that she does not drink alcohol or use illicit drugs.  Skin: intact  Patient/Family orientated to room. Information packet given to patient/family. Admission inpatient armband information verified with patient/family to include name and date of birth and placed on patient arm. Side rails up x 1, fall assessment and education completed with patient/family. Patient/family able to verbalize understanding of risk associated with falls and verbalized understanding to call for assistance before getting out of bed. Call light within reach. Patient/family able to voice and demonstrate understanding of unit orientation instructions.

## 2014-01-16 NOTE — Progress Notes (Signed)
Utilization review completed.  

## 2014-01-16 NOTE — Progress Notes (Signed)
SATURATION QUALIFICATIONS: (This note is used to comply with regulatory documentation for home oxygen)  Patient Saturations on Room Air at Rest = 94%  Patient Saturations on Room Air while Ambulating = 97%  

## 2014-01-16 NOTE — H&P (Signed)
[facility] History and Physical  OCEANIA NOORI UVO:[id number] DOB: [birth year] DOA: 01/16/2014  Referring physician: EDP PCP: Beatrice Lecher, MD   Chief Complaint: Abdominal pain   HPI: Beth [name] is a 80 y.o. female who presents to the ED with c/o abdominal pain for past 1-2 days.  Pain gradually onset, associated with nausea, no vomiting.  She does have cough, fever of up to 103 earlier this evening.  Cough is nonproductive.  She states she is only mildly short of breath.  CT scan abd/pelvis revealed a RML PNA.  Review of Systems: Systems reviewed.  As above, otherwise negative  Past Medical History  Diagnosis Date  . Hypertension   . Hyperlipidemia   . Anxiety     situational  . Insomnia   . Diverticulitis   . Arthritis   . Fibromyalgia   . Asthma    Past Surgical History  Procedure Laterality Date  . Abdominal hysterectomy    . Bladder surgery      tac  . Colonoscopy      2008   Social History:  reports that she has quit smoking. She has never used smokeless tobacco. She reports that she does not drink alcohol or use illicit drugs.  Allergies  Allergen Reactions  . Doxycycline Other (See Comments)    Blisters on mouth and lips  . Pravastatin     Fatigued.   . Simvastatin Other (See Comments)    Myalgias and memory loss  . Trazodone And Nefazodone Other (See Comments)    Made her hyper    Family History  Problem Relation Age of Onset  . Cancer Mother     breast  . Hyperlipidemia Mother   . Hypertension Mother   . Heart disease Father     MI  . Depression Father   . Hyperlipidemia Father   . Dementia Mother      Prior to Admission medications   Medication Sig Start Date End Date Taking? Authorizing Provider  albuterol (PROVENTIL HFA;VENTOLIN HFA) 108 (90 BASE) MCG/ACT inhaler Inhale 2 puffs into the lungs every 6 (six) hours as needed for wheezing or shortness of breath. 05/10/13   Hali Marry, MD  calcium carbonate 1250 MG  capsule Take 1,250 mg by mouth 2 (two) times daily with a meal.      Historical Provider, MD  Cholecalciferol (VITAMIN D3) 1000 UNITS tablet Take 1,000 Units by mouth daily.      Historical Provider, MD  estradiol (ESTRACE) 0.1 MG/GM vaginal cream Place 1 Applicatorful vaginally 3 (three) times a week. 12/23/13   Hali Marry, MD  metoprolol succinate (TOPROL-XL) 25 MG 24 hr tablet TAKE ONE TABLET BY MOUTH ONCE DAILY 10/20/12   Hali Marry, MD  Multiple Vitamin (MULTIVITAMIN) tablet Take 1 tablet by mouth daily.      Historical Provider, MD  Multiple Vitamins-Minerals (PRESERVISION/LUTEIN) CAPS Take by mouth.    Historical Provider, MD  omeprazole (PRILOSEC) 20 MG capsule Take 1 capsule (20 mg total) by mouth daily. 06/17/12   Jerene Bears, MD  Pyridoxine HCl (VITAMIN B-6) 500 MG tablet Take 500 mg by mouth daily.      Historical Provider, MD  sertraline (ZOLOFT) 50 MG tablet TAKE ONE TABLET BY MOUTH ONCE DAILY 11/04/13   Hali Marry, MD  vitamin B-12 (CYANOCOBALAMIN) 1000 MCG tablet Take 1,000 mcg by mouth daily.      Historical Provider, MD  zolpidem (AMBIEN) 5 MG tablet TAKE ONE-HALF TO ONE TABLET BY  MOUTH AT BEDTIME AS NEEDED FOR SLEEP 01/11/14   Hali Marry, MD   Physical Exam: Filed Vitals:   01/16/14 0409  BP:   Pulse: 66  Temp:   Resp: 13    BP 115/53  Pulse 66  Temp(Src) 98.9 F (37.2 C) (Oral)  Resp 13  Ht 5\' 6"  (1.676 m)  Wt 70.308 kg (155 lb)  BMI 25.03 kg/m2  SpO2 96%  General Appearance:    Alert, oriented, no distress, appears stated age  Head:    Normocephalic, atraumatic  Eyes:    PERRL, EOMI, sclera non-icteric        Nose:   Nares without drainage or epistaxis. Mucosa, turbinates normal  Throat:   Moist mucous membranes. Oropharynx without erythema or exudate.  Neck:   Supple. No carotid bruits.  No thyromegaly.  No lymphadenopathy.   Back:     No CVA tenderness, no spinal tenderness  Lungs:     Clear to auscultation bilaterally,  without wheezes, rhonchi or rales  Chest wall:    No tenderness to palpitation  Heart:    Regular rate and rhythm without murmurs, gallops, rubs  Abdomen:     Soft, non-tender, nondistended, normal bowel sounds, no organomegaly  Genitalia:    deferred  Rectal:    deferred  Extremities:   No clubbing, cyanosis or edema.  Pulses:   2+ and symmetric all extremities  Skin:   Skin color, texture, turgor normal, no rashes or lesions  Lymph nodes:   Cervical, supraclavicular, and axillary nodes normal  Neurologic:   CNII-XII intact. Normal strength, sensation and reflexes      throughout    Labs on Admission:  Basic Metabolic Panel:  Recent Labs Lab 01/15/14 2359  NA 134*  K 4.3  CL 99  CO2 23  GLUCOSE 129*  BUN 10  CREATININE 0.61  CALCIUM 9.3   Liver Function Tests:  Recent Labs Lab 01/15/14 2359  AST 19  ALT 13  ALKPHOS 81  BILITOT 0.4  PROT 7.1  ALBUMIN 3.7    Recent Labs Lab 01/15/14 2359  LIPASE 17   No results found for this basename: AMMONIA,  in the last 168 hours CBC:  Recent Labs Lab 01/15/14 2359  WBC 12.3*  NEUTROABS 10.4*  HGB 13.5  HCT 40.3  MCV 91.6  PLT 213   Cardiac Enzymes: No results found for this basename: CKTOTAL, CKMB, CKMBINDEX, TROPONINI,  in the last 168 hours  BNP (last 3 results) No results found for this basename: PROBNP,  in the last 8760 hours CBG: No results found for this basename: GLUCAP,  in the last 168 hours  Radiological Exams on Admission: Dg Chest 2 View  01/16/2014   CLINICAL DATA:  Epigastric pain, diarrhea, muscle cramping. Cough and fever.  EXAM: CHEST  2 VIEW  COMPARISON:  Chest radiograph May 10, 2013  FINDINGS: Cardiac silhouette is mildly enlarged, mediastinal silhouette is nonsuspicious. Similar chronic interstitial changes. RIGHT midlung zone and bibasilar strandy densities. No pleural effusions or focal consolidation. No pneumothorax.  Moderate degenerate change of thoracic spine. Patient is  osteopenic. Deformity of the LEFT clavicle at suggest remote injury.  IMPRESSION: Mild cardiomegaly. Chronic interstitial changes with RIGHT midlung zone and bibasilar atelectasis.   Electronically Signed   By: Elon Alas   On: 01/16/2014 01:41   Ct Abdomen Pelvis W Contrast  01/16/2014   CLINICAL DATA:  Left lower quadrant pain with fever. Initial encounter  EXAM: CT ABDOMEN AND PELVIS  WITH CONTRAST  TECHNIQUE: Multidetector CT imaging of the abdomen and pelvis was performed using the standard protocol following bolus administration of intravenous contrast.  CONTRAST:  167mL OMNIPAQUE IOHEXOL 300 MG/ML  SOLN  COMPARISON:  None.  FINDINGS: BODY WALL: Small fatty umbilical hernia.  LOWER CHEST: Patchy airspace disease in the right lower lobe with focal bronchial wall thickening.  Moderate size sliding-type hiatal hernia.  ABDOMEN/PELVIS:  Liver: No focal abnormality.  Biliary: No evidence of biliary obstruction or stone.  Pancreas: Unremarkable.  Spleen: Unremarkable.  Adrenals: Unremarkable.  Kidneys and ureters: No hydronephrosis or stone. 11 mm cyst in the upper pole left kidney.  Bladder: Unremarkable.  Reproductive: Hysterectomy.  Bowel: Extensive colonic diverticulosis. No active inflammation. No bowel obstruction. Moderate sliding-type hiatal hernia. Negative appendix.  Retroperitoneum: No mass or adenopathy.  Peritoneum: No ascites or pneumoperitoneum.  Vascular: No acute abnormality.  OSSEOUS: No acute abnormalities. Right S2 Tarlov cyst. Advanced and diffuse degenerative disc and facet disease with grade 1 L4-5 slip. Remote T11 superior endplate fracture.  IMPRESSION: 1. No acute intra-abdominal findings. 2. Right lower lobe pneumonia. 3. Extensive colonic diverticulosis. 4. Moderate hiatal hernia.   Electronically Signed   By: Jorje Guild M.D.   On: 01/16/2014 03:28    EKG: Independently reviewed.  Assessment/Plan Principal Problem:   CAP (community acquired pneumonia)   1. CAP  - 1. Admit patient due to hypoxia, desating into the 80s on room air, corrected to 90s on 2L O2 via [place] 2. CAP coverage with rocephin and azithromycin, got 1 dose of levaquin in ED. 3. PNA pathway 4. Cultures pending 5. Tele monitor to monitor tachycardia 6. Tylenol PRN fever    Code Status: Full Code  Family Communication: No family in room Disposition Plan: Admit to inpatient   Time spent: 50 min  GARDNER, JARED M. [facility] Pager [phone]  If 7AM-7PM, please contact the day team taking care of the patient [email or web address] Password [facility] 01/16/2014, 4:33 AM

## 2014-01-16 NOTE — ED Notes (Signed)
Transporting patient to new room assignment. 

## 2014-01-16 NOTE — Progress Notes (Addendum)
PROGRESS NOTE  Beth [name] TDV:[id number] DOB: April 23, 1933 DOA: 01/16/2014 PCP: Beatrice Lecher, MD  HPI/Subjective: Pt with a history of COPD, asthma, hypertension, and diverticulitis who is admitted for CAP.  Saturating at 98% on 2 L of oxygen.  Not on oxygen at home.  Complains of weakness and fatigue.  Nonproductive cough.  Assessment/Plan: CAP (community acquired pneumonia)   - Admitted due to hypoxia, desating into the 80s on room air, corrected to 90s on 2L O2 via [place] - CAP coverage with Rocephin and Azithromycin, got 1 dose of Levaquin in ED. - Initiate treatment with Delsym, nebs and Mucinex.   - Sputum culture and gram stain pending - Tele monitor to monitor tachycardia - Incentive spirometry and ambulating oxygen saturation. - Tylenol PRN fever  COPD - Saturating at 98% on 2 L O2 via [place].  Denies SOB. - Administer Proventil and Duoneb prn.   - Will check ambulating oxygen sats.  Abdominal Pain -Right sided.   Patient has intermittent problems with abdominal pain. -Felt to be due to cough, as the pain worsened after the cough started. -On miralax for mild constipation.  Hypertension - Stable.  Continue Metoprolol 25 mg daily.   - currently well controlled.  Anxiety/Insomnia - Stable.  Continue Zoloft daily and Ambien qhs prn.  DVT Prophylaxis:  heparin  Code Status: Full  Family Communication: No family at bedside Disposition Plan: Remain inpatient.  Antibiotics: Anti-infectives   Start     Dose/Rate Route Frequency Ordered Stop   01/16/14 0430  cefTRIAXone (ROCEPHIN) 1 g in dextrose 5 % 50 mL IVPB     1 g 100 mL/hr over 30 Minutes Intravenous Every 24 hours 01/16/14 0420 01/23/14 0759   01/16/14 0430  azithromycin (ZITHROMAX) 500 mg in dextrose 5 % 250 mL IVPB     500 mg 250 mL/hr over 60 Minutes Intravenous Every 24 hours 01/16/14 0420 01/23/14 0759   01/16/14 0345  levofloxacin (LEVAQUIN) IVPB 750 mg     750 mg 100 mL/hr over 90 Minutes Intravenous   Once 01/16/14 0334 01/16/14 0518      Objective: Filed Vitals:   01/16/14 0536 01/16/14 0539 01/16/14 1047 01/16/14 1110  BP:  136/70 120/59   Pulse:  92 79   Temp:  98.4 F (36.9 C)    TempSrc:  Oral    Resp:  17    Height:  5' 7.2" (1.707 m)    Weight:  70.035 kg (154 lb 6.4 oz)    SpO2: 98% 98%  95%    Intake/Output Summary (Last 24 hours) at 01/16/14 1310 Last data filed at 01/16/14 0626  Gross per 24 hour  Intake 551.25 ml  Output    150 ml  Net 401.25 ml   Filed Weights   01/15/14 2340 01/16/14 0539  Weight: 70.308 kg (155 lb) 70.035 kg (154 lb 6.4 oz)   Exam: General: Well developed, well nourished elderly female, NAD, appears stated age, on 2 L of O2 via [place]  Neck: Supple, no JVD, no masses  Cardiovascular: RRR, S1 S2 auscultated, no rubs, murmurs or gallops.   Respiratory: Mild crackle in RML/RLL.  Left lung field CTA with equal chest rise.  Abdomen: Soft, nondistended, + bowel sounds, mildly tender in RLQ and LLQ.  Extremities: lower extremities warm dry without cyanosis clubbing or edema. 2+ PT pulses. Neuro: AAOx3, cranial nerves grossly intact.  Psych: Normal affect and demeanor with intact judgement and insight  Data Reviewed: Basic Metabolic Panel:  Recent Labs  Lab 01/15/14 2359  NA 134*  K 4.3  CL 99  CO2 23  GLUCOSE 129*  BUN 10  CREATININE 0.61  CALCIUM 9.3   Liver Function Tests:  Recent Labs Lab 01/15/14 2359  AST 19  ALT 13  ALKPHOS 81  BILITOT 0.4  PROT 7.1  ALBUMIN 3.7    Recent Labs Lab 01/15/14 2359  LIPASE 17   CBC:  Recent Labs Lab 01/15/14 2359  WBC 12.3*  NEUTROABS 10.4*  HGB 13.5  HCT 40.3  MCV 91.6  PLT 213   Studies: Dg Chest 2 View  01/16/2014   CLINICAL DATA:  Epigastric pain, diarrhea, muscle cramping. Cough and fever.  EXAM: CHEST  2 VIEW  COMPARISON:  Chest radiograph May 10, 2013  FINDINGS: Cardiac silhouette is mildly enlarged, mediastinal silhouette is nonsuspicious. Similar chronic  interstitial changes. RIGHT midlung zone and bibasilar strandy densities. No pleural effusions or focal consolidation. No pneumothorax.  Moderate degenerate change of thoracic spine. Patient is osteopenic. Deformity of the LEFT clavicle at suggest remote injury.  IMPRESSION: Mild cardiomegaly. Chronic interstitial changes with RIGHT midlung zone and bibasilar atelectasis.   Electronically Signed   By: Elon Alas   On: 01/16/2014 01:41   Ct Abdomen Pelvis W Contrast  01/16/2014   CLINICAL DATA:  Left lower quadrant pain with fever. Initial encounter  EXAM: CT ABDOMEN AND PELVIS WITH CONTRAST  TECHNIQUE: Multidetector CT imaging of the abdomen and pelvis was performed using the standard protocol following bolus administration of intravenous contrast.  CONTRAST:  177mL OMNIPAQUE IOHEXOL 300 MG/ML  SOLN  COMPARISON:  None.  FINDINGS: BODY WALL: Small fatty umbilical hernia.  LOWER CHEST: Patchy airspace disease in the right lower lobe with focal bronchial wall thickening.  Moderate size sliding-type hiatal hernia.  ABDOMEN/PELVIS:  Liver: No focal abnormality.  Biliary: No evidence of biliary obstruction or stone.  Pancreas: Unremarkable.  Spleen: Unremarkable.  Adrenals: Unremarkable.  Kidneys and ureters: No hydronephrosis or stone. 11 mm cyst in the upper pole left kidney.  Bladder: Unremarkable.  Reproductive: Hysterectomy.  Bowel: Extensive colonic diverticulosis. No active inflammation. No bowel obstruction. Moderate sliding-type hiatal hernia. Negative appendix.  Retroperitoneum: No mass or adenopathy.  Peritoneum: No ascites or pneumoperitoneum.  Vascular: No acute abnormality.  OSSEOUS: No acute abnormalities. Right S2 Tarlov cyst. Advanced and diffuse degenerative disc and facet disease with grade 1 L4-5 slip. Remote T11 superior endplate fracture.  IMPRESSION: 1. No acute intra-abdominal findings. 2. Right lower lobe pneumonia. 3. Extensive colonic diverticulosis. 4. Moderate hiatal hernia.    Electronically Signed   By: Jorje Guild M.D.   On: 01/16/2014 03:28    Scheduled Meds: . antiseptic oral rinse  7 mL Mouth Rinse BID  . azithromycin  500 mg Intravenous Q24H  . calcium carbonate  1,250 mg Oral BID WC  . cefTRIAXone (ROCEPHIN)  IV  1 g Intravenous Q24H  . cholecalciferol  1,000 Units Oral Daily  . estradiol  1 Applicatorful Vaginal Once per day on Mon Wed Fri  . guaiFENesin  1,200 mg Oral BID  . heparin  5,000 Units Subcutaneous 3 times per day  . ipratropium-albuterol  3 mL Nebulization Q6H  . metoprolol succinate  25 mg Oral Daily  . pantoprazole  40 mg Oral Daily  . polyethylene glycol  17 g Oral Daily  . vitamin B-6  500 mg Oral Daily  . sertraline  50 mg Oral Daily  . vitamin B-12  1,000 mcg Oral Daily   Continuous Infusions:  Principal Problem:   CAP (community acquired pneumonia) Active Problems:   HYPERTENSION, BENIGN   COPD (chronic obstructive pulmonary disease)   Generalized anxiety disorder   Insomnia  Keely Reichel, PA-S2 Imogene Burn, PA-C [facility] Pager [phone]. If 7PM-7AM, please contact night-coverage at [email or web address], password [facility] 01/16/2014, 1:10 PM  LOS: 0 days   Patient was seen, examined,treatment plan was discussed with the Physician extender. I have directly reviewed the clinical findings, lab, imaging studies and management of this patient in detail. I have made the necessary changes to the above noted documentation, and agree with the documentation, as recorded by the Physician extender.  Phillips Climes MD [facility].

## 2014-01-16 NOTE — Progress Notes (Signed)
Pharmacy: Re- abx dose adjustment for renal function  Patient is a 80 y.o F on ceftriaxone and azithromycin  For CAP.   Scr 0.61, Tmax 12.3, afeb.  9/21 bcx x2>> pending  Plan: 1) continue ceftriaxone 1gm IV q24h and azithromycin 500mg  IV q24h 2) Pharmacy will sign off as no renal adjustment is needed for these two abx  Dia Sitter, PharmD, BCPS

## 2014-01-16 NOTE — ED Provider Notes (Signed)
CSN: [id number]     Arrival date & time 01/15/14  2327 History   First MD Initiated Contact with Patient 01/16/14 0038     Chief Complaint  Patient presents with  . Abdominal Pain     (Consider location/radiation/quality/duration/timing/severity/associated sxs/prior Treatment) HPI Patient presents with abdominal pain for the past one to 2 days. The pain is gradual in onset. Associated with nausea but no vomiting. She's had several loose stools. She says she's had a fever up to 103 earlier this evening. She's been anorexic. She has not eaten since lunch. Denies any urinary symptoms including frequency, urgency, hematuria or dysuria. She states she has had increased cough but without sputum production. She's had no chest pain. She denies any lower extremity swelling or pain. Denies URI symptoms including nasal congestion, sinus pressure or sore throat. Past Medical History  Diagnosis Date  . Hypertension   . Hyperlipidemia   . Anxiety     situational  . Insomnia   . Diverticulitis   . Arthritis   . Fibromyalgia   . Asthma   . COPD (chronic obstructive pulmonary disease)     PER PT   Past Surgical History  Procedure Laterality Date  . Abdominal hysterectomy    . Bladder surgery      tac  . Colonoscopy      2008   Family History  Problem Relation Age of Onset  . Cancer Mother     breast  . Hyperlipidemia Mother   . Hypertension Mother   . Heart disease Father     MI  . Depression Father   . Hyperlipidemia Father   . Dementia Mother    History  Substance Use Topics  . Smoking status: Former Research scientist (life sciences)  . Smokeless tobacco: Never Used  . Alcohol Use: No   OB History   Grav Para Term Preterm Abortions TAB SAB Ect Mult Living                 Review of Systems  Constitutional: Positive for fever and fatigue. Negative for chills.  HENT: Negative for sinus pressure and sore throat.   Respiratory: Positive for cough. Negative for shortness of breath.   Cardiovascular:  Negative for chest pain, palpitations and leg swelling.  Gastrointestinal: Positive for nausea, abdominal pain and diarrhea. Negative for vomiting, constipation and blood in stool.  Genitourinary: Negative for dysuria, frequency, flank pain and difficulty urinating.  Musculoskeletal: Negative for back pain, joint swelling, myalgias, neck pain and neck stiffness.  Skin: Negative for rash and wound.  Neurological: Negative for dizziness, weakness, light-headedness, numbness and headaches.  All other systems reviewed and are negative.     Allergies  Doxycycline; Pravastatin; Simvastatin; and Trazodone and nefazodone  Home Medications   Prior to Admission medications   Medication Sig Start Date End Date Taking? Authorizing Provider  calcium carbonate 1250 MG capsule Take 1,250 mg by mouth daily.    Yes Historical Provider, MD  Cholecalciferol (VITAMIN D3) 1000 UNITS tablet Take 1,000 Units by mouth daily.     Yes Historical Provider, MD  metoprolol succinate (TOPROL-XL) 25 MG 24 hr tablet Take 25 mg by mouth daily.   Yes Historical Provider, MD  Multiple Vitamin (MULTIVITAMIN) tablet Take 1 tablet by mouth daily.     Yes Historical Provider, MD  Multiple Vitamins-Minerals (PRESERVISION/LUTEIN) CAPS Take 1 capsule by mouth daily.    Yes Historical Provider, MD  omeprazole (PRILOSEC) 20 MG capsule Take 1 capsule (20 mg total) by mouth daily. 06/17/12  Yes Jerene Bears, MD  Pyridoxine HCl (VITAMIN B-6) 500 MG tablet Take 500 mg by mouth daily.     Yes Historical Provider, MD  sertraline (ZOLOFT) 50 MG tablet Take 50 mg by mouth daily.   Yes Historical Provider, MD  vitamin B-12 (CYANOCOBALAMIN) 1000 MCG tablet Take 1,000 mcg by mouth daily.     Yes Historical Provider, MD  zolpidem (AMBIEN) 5 MG tablet Take 5-10 mg by mouth at bedtime as needed for sleep.   Yes Historical Provider, MD   BP 125/59  Pulse 93  Temp(Src) 102.4 F (39.1 C) (Oral)  Resp 18  Ht 5' 7.2" (1.707 m)  Wt 154 lb 6.4 oz  (70.035 kg)  BMI 24.04 kg/m2  SpO2 95% Physical Exam  Nursing note and vitals reviewed. Constitutional: She is oriented to person, place, and time. She appears well-developed and well-nourished. No distress.  HENT:  Head: Normocephalic and atraumatic.  Mouth/Throat: Oropharynx is clear and moist. No oropharyngeal exudate.  Eyes: EOM are normal. Pupils are equal, round, and reactive to light.  Neck: Normal range of motion. Neck supple.  Cardiovascular: Normal rate and regular rhythm.   Pulmonary/Chest: Effort normal. No respiratory distress. She has no wheezes. She has rales (Scattered rales in the right base.).  Abdominal: Soft. Bowel sounds are normal. She exhibits no distension and no mass. There is tenderness (patient has right lower quadrant tenderness with palpation. There is no rebound or guarding.). There is no rebound and no guarding.  Musculoskeletal: Normal range of motion. She exhibits no edema and no tenderness.  No lower extremity edema or pain.  Neurological: She is alert and oriented to person, place, and time.  Moves all extremities without deficit. Sensation is grossly intact.  Skin: Skin is warm and dry. No rash noted. No erythema.  Psychiatric: She has a normal mood and affect. Her behavior is normal.    ED Course  Procedures (including critical care time) Labs Review Labs Reviewed  CBC WITH DIFFERENTIAL - Abnormal; Notable for the following:    WBC 12.3 (*)    Neutrophils Relative % 85 (*)    Neutro Abs 10.4 (*)    Lymphocytes Relative 6 (*)    All other components within normal limits  COMPREHENSIVE METABOLIC PANEL - Abnormal; Notable for the following:    Sodium 134 (*)    Glucose, Bld 129 (*)    GFR calc non Af Amer 84 (*)    All other components within normal limits  CULTURE, EXPECTORATED SPUTUM-ASSESSMENT  CULTURE, BLOOD (ROUTINE X 2)  CULTURE, BLOOD (ROUTINE X 2)  CULTURE, RESPIRATORY (NON-EXPECTORATED)  LIPASE, BLOOD  URINALYSIS, ROUTINE W REFLEX  MICROSCOPIC  HIV ANTIBODY (ROUTINE TESTING)  STREP PNEUMONIAE URINARY ANTIGEN  LEGIONELLA ANTIGEN, URINE  BASIC METABOLIC PANEL  CBC  I-STAT TROPOININ, ED    Imaging Review Dg Chest 2 View  01/16/2014   CLINICAL DATA:  Epigastric pain, diarrhea, muscle cramping. Cough and fever.  EXAM: CHEST  2 VIEW  COMPARISON:  Chest radiograph May 10, 2013  FINDINGS: Cardiac silhouette is mildly enlarged, mediastinal silhouette is nonsuspicious. Similar chronic interstitial changes. RIGHT midlung zone and bibasilar strandy densities. No pleural effusions or focal consolidation. No pneumothorax.  Moderate degenerate change of thoracic spine. Patient is osteopenic. Deformity of the LEFT clavicle at suggest remote injury.  IMPRESSION: Mild cardiomegaly. Chronic interstitial changes with RIGHT midlung zone and bibasilar atelectasis.   Electronically Signed   By: Elon Alas   On: 01/16/2014 01:41   Ct  Abdomen Pelvis W Contrast  01/16/2014   CLINICAL DATA:  Left lower quadrant pain with fever. Initial encounter  EXAM: CT ABDOMEN AND PELVIS WITH CONTRAST  TECHNIQUE: Multidetector CT imaging of the abdomen and pelvis was performed using the standard protocol following bolus administration of intravenous contrast.  CONTRAST:  15mL OMNIPAQUE IOHEXOL 300 MG/ML  SOLN  COMPARISON:  None.  FINDINGS: BODY WALL: Small fatty umbilical hernia.  LOWER CHEST: Patchy airspace disease in the right lower lobe with focal bronchial wall thickening.  Moderate size sliding-type hiatal hernia.  ABDOMEN/PELVIS:  Liver: No focal abnormality.  Biliary: No evidence of biliary obstruction or stone.  Pancreas: Unremarkable.  Spleen: Unremarkable.  Adrenals: Unremarkable.  Kidneys and ureters: No hydronephrosis or stone. 11 mm cyst in the upper pole left kidney.  Bladder: Unremarkable.  Reproductive: Hysterectomy.  Bowel: Extensive colonic diverticulosis. No active inflammation. No bowel obstruction. Moderate sliding-type hiatal hernia.  Negative appendix.  Retroperitoneum: No mass or adenopathy.  Peritoneum: No ascites or pneumoperitoneum.  Vascular: No acute abnormality.  OSSEOUS: No acute abnormalities. Right S2 Tarlov cyst. Advanced and diffuse degenerative disc and facet disease with grade 1 L4-5 slip. Remote T11 superior endplate fracture.  IMPRESSION: 1. No acute intra-abdominal findings. 2. Right lower lobe pneumonia. 3. Extensive colonic diverticulosis. 4. Moderate hiatal hernia.   Electronically Signed   By: Jorje Guild M.D.   On: 01/16/2014 03:28     EKG Interpretation None      MDM   Final diagnoses:  Community acquired pneumonia    Patient with pneumonia on CT. No acute abdominal findings. Patient saturations dropped into the 80s. She's had mild decrease in her blood pressure and increased heart rate. Discussed with Dr. Darrick Penna and will admit for community-acquired pneumonia    Julianne Rice, MD 01/17/14 [phone]

## 2014-01-16 NOTE — ED Notes (Signed)
PT states that she is also experiencing cramping "drawing up" of her hands and legs and feet. Increased over the last few nights.

## 2014-01-17 DIAGNOSIS — R519 Headache, unspecified: Secondary | ICD-10-CM

## 2014-01-17 DIAGNOSIS — R51 Headache: Secondary | ICD-10-CM

## 2014-01-17 LAB — BASIC METABOLIC PANEL
Anion gap: 11 (ref 5–15)
BUN: 7 mg/dL (ref 6–23)
CO2: 26 mEq/L (ref 19–32)
Calcium: 8.8 mg/dL (ref 8.4–10.5)
Chloride: 98 mEq/L (ref 96–112)
Creatinine, Ser: 0.61 mg/dL (ref 0.50–1.10)
GFR calc Af Amer: >90 mL/min (ref >90)
GFR calc non Af Amer: 84 mL/min — ABNORMAL LOW (ref >90)
Glucose, Bld: 105 mg/dL — ABNORMAL HIGH (ref 70–99)
Potassium: 4.2 mEq/L (ref 3.7–5.3)
Sodium: 135 mEq/L — ABNORMAL LOW (ref 137–147)

## 2014-01-17 LAB — CBC
HEMATOCRIT: 35.2 % — AB (ref 36.0–46.0)
Hemoglobin: 11.5 g/dL — ABNORMAL LOW (ref 12.0–15.0)
MCH: 29.7 pg (ref 26.0–34.0)
MCHC: 32.7 g/dL (ref 30.0–36.0)
MCV: 91 fL (ref 78.0–100.0)
PLATELETS: 188 10*3/uL (ref 150–400)
RBC: 3.87 MIL/uL (ref 3.87–5.11)
RDW: 14.2 % (ref 11.5–15.5)
WBC: 9.7 10*3/uL (ref 4.0–10.5)

## 2014-01-17 LAB — LEGIONELLA ANTIGEN, URINE: Legionella Antigen, Urine: NEGATIVE

## 2014-01-17 MED ORDER — LEVOFLOXACIN IN D5W 750 MG/150ML IV SOLN: 750.0000 mg | INTRAVENOUS | Status: DC

## 2014-01-17 MED ORDER — LEVOFLOXACIN IN D5W 500 MG/100ML IV SOLN
500.0000 mg | INTRAVENOUS | Status: DC
  Administered 2014-01-17: 500 mg via INTRAVENOUS
  Filled 2014-01-17 (×2): qty 100

## 2014-01-17 MED ORDER — ACETAMINOPHEN 325 MG PO TABS
650.0000 mg | ORAL_TABLET | Freq: Four times a day (QID) | ORAL | Status: DC | PRN
  Administered 2014-01-17 – 2014-01-18 (×2): 650 mg via ORAL
  Filled 2014-01-17 (×2): qty 2

## 2014-01-17 MED ORDER — LEVOFLOXACIN 750 MG PO TABS: 750.0000 mg | ORAL_TABLET | Freq: Every day | ORAL | Status: DC

## 2014-01-17 NOTE — Progress Notes (Addendum)
PROGRESS NOTE  Beth [name] TKZ:[id number] DOB: 1932-08-25 DOA: 01/16/2014 PCP: Beatrice Lecher, MD  HPI/Subjective: Pt is a 80 yo female with a prior history of asthma, hypertension, and diverticulitis who has been admitted for CAP.  On the morning of 9/22, she complains of some chills.  Pt states that she has been coughing and is able to produce sputum and clear her airway.  Pt complains of right sided temporal intermittent headache.  Pt denies any urinary frequency, urgency, or dysuria.       9/22:  Patient reports she feels better and wants to go home.  Assessment/Plan: CAP (community acquired pneumonia)  - Admitted due to hypoxia, desating into the 80s on room air, now in the 90s on room air.  - Despite Fever of 102.4 on 9/21 pm, WBC has normalized. Cough is now productive. - Discontinued Rocephin and Azithromycin and began Levaquin therapy.   - Continue treatment with Delsym, nebs and Mucinex.  - Gram stain shows few gram negative rods and rare gram positive cocci on 9/22.  Sputum culture is still pending  - Urine negative for legionella and strep pneumo. - Incentive spirometry   COPD  - Saturating >92% on room air. Denies SOB.  - Administer Proventil and Duoneb prn.  - Will check ambulating oxygen sats.   Abdominal Pain  -Resolved. -Right sided. Patient has intermittent problems with abdominal pain.  Abdominal CT is negative. -Felt to be due to cough, as the pain worsened after the cough started.  -On Miralax for mild constipation.  -Pt had bowel movement on 9/22.   Hypertension  - Stable. Continue Metoprolol 25 mg daily.  - Currently well controlled.   Headache - Complains of right sided intermittent temporal headache on 9/22. - No nausea, vomiting, visual changes.  Treat with Tylenol prn.  Anxiety/Insomnia  - Stable. Continue Zoloft daily and Ambien qhs prn.  DVT Prophylaxis:  heparin  Code Status:  FULL Family Communication: No family at bedside.  Patient  is awake, alert, and agreeable to plan.   Disposition Plan: Likely home.  Antibiotics: Anti-infectives   Start     Dose/Rate Route Frequency Ordered Stop   01/16/14 0430  cefTRIAXone (ROCEPHIN) 1 g in dextrose 5 % 50 mL IVPB     1 g 100 mL/hr over 30 Minutes Intravenous Every 24 hours 01/16/14 0420 01/23/14 0759   01/16/14 0430  azithromycin (ZITHROMAX) 500 mg in dextrose 5 % 250 mL IVPB     500 mg 250 mL/hr over 60 Minutes Intravenous Every 24 hours 01/16/14 0420 01/23/14 0759   01/16/14 0345  levofloxacin (LEVAQUIN) IVPB 750 mg     750 mg 100 mL/hr over 90 Minutes Intravenous  Once 01/16/14 0334 01/16/14 0518      Objective: Filed Vitals:   01/16/14 2258 01/17/14 0548 01/17/14 0737 01/17/14 0937  BP: 109/40 125/59    Pulse: 74 93    Temp: 98.4 F (36.9 C) 102.4 F (39.1 C) 99.2 F (37.3 C)   TempSrc: Oral Oral Oral   Resp: 18 18    Height:      Weight:      SpO2: 95% 95%  92%    Intake/Output Summary (Last 24 hours) at 01/17/14 1050 Last data filed at 01/16/14 2132  Gross per 24 hour  Intake    480 ml  Output      0 ml  Net    480 ml   Filed Weights   01/15/14 2340 01/16/14 0539  Weight:  70.308 kg (155 lb) 70.035 kg (154 lb 6.4 oz)    Exam: General: Well developed, well nourished elderly female.  In NAD, appears stated age. Sitting up in bed.  Looks well. HEENT: Anicteic Sclera, MMM.  Neck: Supple, no JVD, no masses  Cardiovascular: RRR, S1 S2 auscultated, no rubs, murmurs or gallops.   Respiratory: Mild crackles in lower right lung field.  Clear to auscultation on left side.  Equal chest rise  Abdomen: Soft, nontender, nondistended, + bowel sounds  Extremities: warm dry without cyanosis clubbing or edema.  Neuro: AAOx3, cranial nerves grossly intact. Strength 5/5 in upper and lower extremities  Psych: Normal affect and demeanor with intact judgement and insight  Data Reviewed: Basic Metabolic Panel:  Recent Labs Lab 01/15/14 2359 01/17/14 0815  NA  134* 135*  K 4.3 4.2  CL 99 98  CO2 23 26  GLUCOSE 129* 105*  BUN 10 7  CREATININE 0.61 0.61  CALCIUM 9.3 8.8   Liver Function Tests:  Recent Labs Lab 01/15/14 2359  AST 19  ALT 13  ALKPHOS 81  BILITOT 0.4  PROT 7.1  ALBUMIN 3.7    Recent Labs Lab 01/15/14 2359  LIPASE 17   CBC:  Recent Labs Lab 01/15/14 2359 01/17/14 0815  WBC 12.3* 9.7  NEUTROABS 10.4*  --   HGB 13.5 11.5*  HCT 40.3 35.2*  MCV 91.6 91.0  PLT 213 188    Recent Results (from the past 240 hour(s))  CULTURE, BLOOD (ROUTINE X 2)     Status: None   Collection Time    01/16/14  4:47 AM      Result Value Ref Range Status   Specimen Description BLOOD RIGHT ARM   Final   Special Requests BOTTLES DRAWN AEROBIC AND ANAEROBIC 5CC EACH   Final   Culture  Setup Time     Final   Value: 01/16/2014 09:41     Performed at [facility]   Culture     Final   Value:        BLOOD CULTURE RECEIVED NO GROWTH TO DATE CULTURE WILL BE HELD FOR 5 DAYS BEFORE ISSUING A FINAL NEGATIVE REPORT     Performed at [facility]   Report Status PENDING   Incomplete  CULTURE, BLOOD (ROUTINE X 2)     Status: None   Collection Time    01/16/14  4:52 AM      Result Value Ref Range Status   Specimen Description BLOOD RIGHT WRIST   Final   Special Requests BOTTLES DRAWN AEROBIC AND ANAEROBIC 5CC   Final   Culture  Setup Time     Final   Value: 01/16/2014 09:41     Performed at [facility]   Culture     Final   Value:        BLOOD CULTURE RECEIVED NO GROWTH TO DATE CULTURE WILL BE HELD FOR 5 DAYS BEFORE ISSUING A FINAL NEGATIVE REPORT     Performed at [facility]   Report Status PENDING   Incomplete  CULTURE, EXPECTORATED SPUTUM-ASSESSMENT     Status: None   Collection Time    01/16/14  5:07 PM      Result Value Ref Range Status   Specimen Description SPUTUM   Final   Special Requests NONE   Final   Sputum evaluation     Final   Value: THIS SPECIMEN IS ACCEPTABLE. RESPIRATORY CULTURE  REPORT TO FOLLOW.   Report Status 01/16/2014  FINAL   Final  CULTURE, RESPIRATORY (NON-EXPECTORATED)     Status: None   Collection Time    01/16/14  5:07 PM      Result Value Ref Range Status   Specimen Description SPUTUM   Final   Special Requests NONE   Final   Gram Stain     Final   Value: MODERATE WBC PRESENT,BOTH PMN AND MONONUCLEAR     RARE SQUAMOUS EPITHELIAL CELLS PRESENT     FEW GRAM NEGATIVE RODS     RARE GRAM POSITIVE COCCI     IN PAIRS   Culture     Final   Value: NO GROWTH     Performed at [facility]   Report Status PENDING   Incomplete     Studies: Dg Chest 2 View  01/16/2014   CLINICAL DATA:  Epigastric pain, diarrhea, muscle cramping. Cough and fever.  EXAM: CHEST  2 VIEW  COMPARISON:  Chest radiograph May 10, 2013  FINDINGS: Cardiac silhouette is mildly enlarged, mediastinal silhouette is nonsuspicious. Similar chronic interstitial changes. RIGHT midlung zone and bibasilar strandy densities. No pleural effusions or focal consolidation. No pneumothorax.  Moderate degenerate change of thoracic spine. Patient is osteopenic. Deformity of the LEFT clavicle at suggest remote injury.  IMPRESSION: Mild cardiomegaly. Chronic interstitial changes with RIGHT midlung zone and bibasilar atelectasis.   Electronically Signed   By: Elon Alas   On: 01/16/2014 01:41   Ct Abdomen Pelvis W Contrast  01/16/2014   CLINICAL DATA:  Left lower quadrant pain with fever. Initial encounter  EXAM: CT ABDOMEN AND PELVIS WITH CONTRAST  TECHNIQUE: Multidetector CT imaging of the abdomen and pelvis was performed using the standard protocol following bolus administration of intravenous contrast.  CONTRAST:  190mL OMNIPAQUE IOHEXOL 300 MG/ML  SOLN  COMPARISON:  None.  FINDINGS: BODY WALL: Small fatty umbilical hernia.  LOWER CHEST: Patchy airspace disease in the right lower lobe with focal bronchial wall thickening.  Moderate size sliding-type hiatal hernia.  ABDOMEN/PELVIS:  Liver: No  focal abnormality.  Biliary: No evidence of biliary obstruction or stone.  Pancreas: Unremarkable.  Spleen: Unremarkable.  Adrenals: Unremarkable.  Kidneys and ureters: No hydronephrosis or stone. 11 mm cyst in the upper pole left kidney.  Bladder: Unremarkable.  Reproductive: Hysterectomy.  Bowel: Extensive colonic diverticulosis. No active inflammation. No bowel obstruction. Moderate sliding-type hiatal hernia. Negative appendix.  Retroperitoneum: No mass or adenopathy.  Peritoneum: No ascites or pneumoperitoneum.  Vascular: No acute abnormality.  OSSEOUS: No acute abnormalities. Right S2 Tarlov cyst. Advanced and diffuse degenerative disc and facet disease with grade 1 L4-5 slip. Remote T11 superior endplate fracture.  IMPRESSION: 1. No acute intra-abdominal findings. 2. Right lower lobe pneumonia. 3. Extensive colonic diverticulosis. 4. Moderate hiatal hernia.   Electronically Signed   By: Jorje Guild M.D.   On: 01/16/2014 03:28    Scheduled Meds: . antiseptic oral rinse  7 mL Mouth Rinse BID  . azithromycin  500 mg Intravenous Q24H  . calcium carbonate  1,250 mg Oral BID WC  . cefTRIAXone (ROCEPHIN)  IV  1 g Intravenous Q24H  . cholecalciferol  1,000 Units Oral Daily  . estradiol  1 Applicatorful Vaginal Once per day on Mon Wed Fri  . guaiFENesin  1,200 mg Oral BID  . heparin  5,000 Units Subcutaneous 3 times per day  . ipratropium-albuterol  3 mL Nebulization BID  . metoprolol succinate  25 mg Oral Daily  . pantoprazole  40 mg Oral Daily  .  polyethylene glycol  17 g Oral Daily  . vitamin B-6  500 mg Oral Daily  . sertraline  50 mg Oral Daily  . vitamin B-12  1,000 mcg Oral Daily   Continuous Infusions:   Principal Problem:   CAP (community acquired pneumonia) Active Problems:   COPD (chronic obstructive pulmonary disease)   HYPERTENSION, BENIGN   Headache(784.0)   Generalized anxiety disorder   Insomnia   Keely Reichel, PA-S2 Imogene Burn, PA-C  [facility] Pager  [phone]. If 7PM-7AM, please contact night-coverage at [email or web address], password [email or web address]  Patient was seen, examined,treatment plan was discussed with the Physician extender. I have directly reviewed the clinical findings, lab, imaging studies and management of this patient in detail. I have made the necessary changes to the above noted documentation, and agree with the documentation, as recorded by the Physician extender.  Phillips Climes MD [facility].

## 2014-01-18 DIAGNOSIS — I10 Essential (primary) hypertension: Secondary | ICD-10-CM

## 2014-01-18 DIAGNOSIS — R51 Headache: Secondary | ICD-10-CM

## 2014-01-18 DIAGNOSIS — J42 Unspecified chronic bronchitis: Secondary | ICD-10-CM

## 2014-01-18 LAB — CULTURE, RESPIRATORY W GRAM STAIN

## 2014-01-18 LAB — CULTURE, RESPIRATORY: CULTURE: NORMAL

## 2014-01-18 MED ORDER — HYDROCOD POLST-CHLORPHEN POLST 10-8 MG/5ML PO LQCR
5.0000 mL | Freq: Every evening | ORAL | Status: DC | PRN
Start: 2014-01-18 — End: 2014-03-14

## 2014-01-18 MED ORDER — LEVOFLOXACIN 500 MG PO TABS
500.0000 mg | ORAL_TABLET | Freq: Every day | ORAL | Status: DC
  Administered 2014-01-18: 500 mg via ORAL
  Filled 2014-01-18: qty 1

## 2014-01-18 MED ORDER — POLYETHYLENE GLYCOL 3350 17 GM/SCOOP PO POWD: ORAL | Status: DC

## 2014-01-18 MED ORDER — ALBUTEROL SULFATE HFA 108 (90 BASE) MCG/ACT IN AERS: 2.0000 | INHALATION_SPRAY | Freq: Four times a day (QID) | RESPIRATORY_TRACT | Status: DC | PRN

## 2014-01-18 MED ORDER — GUAIFENESIN ER 600 MG PO TB12: 1200.0000 mg | ORAL_TABLET | Freq: Two times a day (BID) | ORAL | Status: DC

## 2014-01-18 MED ORDER — LEVOFLOXACIN 500 MG PO TABS: 500.0000 mg | ORAL_TABLET | Freq: Every day | ORAL | Status: DC

## 2014-01-18 NOTE — Discharge Summary (Signed)
Patient was seen, examined,treatment plan was discussed with the Physician extender. I have directly reviewed the clinical findings, lab, imaging studies and management of this patient in detail. I have made the necessary changes to the above noted documentation, and agree with the documentation, as recorded by the Physician extender.  Patient admitted with pneumonia, significantly improved with antibiotics, cultures negative so far. Seen ambulating in the room and hallway, not using any oxygen. Lungs completely clear on exam, afebrile and nontoxic looking. Stable for discharge for further treatment to be done in the outpatient setting with oral antibiotics.  Beth Alexander MD [facility].

## 2014-01-18 NOTE — Discharge Instructions (Signed)
Have a follow up chest xray in 4 weeks to ensure your lungs have cleared.    If you have pneumonia again in the next 6 months seek referral to a pulmonologist.

## 2014-01-18 NOTE — Progress Notes (Signed)
[name] to be D/C'd Home per MD order.  Discussed with the patient and all questions fully answered.    Medication List         albuterol 108 (90 BASE) MCG/ACT inhaler  Commonly known as:  PROVENTIL HFA;VENTOLIN HFA  Inhale 2 puffs into the lungs every 6 (six) hours as needed for wheezing or shortness of breath.     calcium carbonate 1250 MG capsule  Take 1,250 mg by mouth daily.     chlorpheniramine-HYDROcodone 10-8 MG/5ML Lqcr  Commonly known as:  TUSSIONEX PENNKINETIC ER  Take 5 mLs by mouth at bedtime as needed for cough.     cholecalciferol 1000 UNITS tablet  Commonly known as:  VITAMIN D  Take 1,000 Units by mouth daily.     guaiFENesin 600 MG 12 hr tablet  Commonly known as:  MUCINEX  Take 2 tablets (1,200 mg total) by mouth 2 (two) times daily.     levofloxacin 500 MG tablet  Commonly known as:  LEVAQUIN  Take 1 tablet (500 mg total) by mouth daily.     metoprolol succinate 25 MG 24 hr tablet  Commonly known as:  TOPROL-XL  Take 25 mg by mouth daily.     multivitamin tablet  Take 1 tablet by mouth daily.     omeprazole 20 MG capsule  Commonly known as:  PRILOSEC  Take 1 capsule (20 mg total) by mouth daily.     polyethylene glycol powder powder  Commonly known as:  GLYCOLAX/MIRALAX  Take 1 capful (17 g) daily in 1/2 (4 ounces) of liquid.  Skip a day if you have diarrhea.     PRESERVISION/LUTEIN Caps  Take 1 capsule by mouth daily.     sertraline 50 MG tablet  Commonly known as:  ZOLOFT  Take 50 mg by mouth daily.     vitamin B-12 1000 MCG tablet  Commonly known as:  CYANOCOBALAMIN  Take 1,000 mcg by mouth daily.     vitamin B-6 500 MG tablet  Take 500 mg by mouth daily.     zolpidem 5 MG tablet  Commonly known as:  AMBIEN  Take 5-10 mg by mouth at bedtime as needed for sleep.        VVS, Skin clean, dry and intact without evidence of skin break down, no evidence of skin tears noted. IV catheter discontinued intact. Site without signs and  symptoms of complications. Dressing and pressure applied.  An After Visit Summary was printed and given to the patient.  D/c education completed with patient/family including follow up instructions, medication list, d/c activities limitations if indicated, with other d/c instructions as indicated by MD - patient able to verbalize understanding, all questions fully answered.   Patient instructed to return to ED, call 911, or call MD for any changes in condition.   Patient escorted via [place], and D/C home via private auto.  Delman Cheadle 01/18/2014 10:21 AM

## 2014-01-18 NOTE — Care Management Note (Signed)
    Page 1 of 1   01/18/2014     11:57:33 AM CARE MANAGEMENT NOTE 01/18/2014  Patient:  Beth [name], Beth [name]   Account Number:  [id number]  Date Initiated:  01/17/2014  Documentation initiated by:  [facility]  Subjective/Objective Assessment:   CAP (community acquired pneumonia     Action/Plan:   Anticipated DC Date:  01/11/2014   Anticipated DC Plan:  [place]  CM consult      Choice offered to / List presented to:             Status of service:  Completed, signed off Medicare Important Message given?  YES (If response is "NO", the following Medicare IM given date fields will be blank) Date Medicare IM given:  01/18/2014 Medicare IM given by:  Tomi Bamberger Date Additional Medicare IM given:   Additional Medicare IM given by:    Discharge Disposition:  HOME/SELF CARE  Per UR Regulation:  Reviewed for med. necessity/level of care/duration of stay  If discussed at [place] of Stay Meetings, dates discussed:    Comments:

## 2014-01-18 NOTE — Discharge Summary (Signed)
Physician Discharge Summary  Beth [name]:[id number] DOB: May 04, 1932 DOA: 01/16/2014  PCP: Beth Lecher, MD  Admit date: 01/16/2014 Discharge date: 01/18/2014  Time spent: 45 minutes  Recommendations for Outpatient Follow-up:  1. Follow up cxr in 4 weeks. 2. bmet / cbc at hospital follow up. 3. Follow finalized blood and sputum cultures.  Discharge Diagnoses:  Principal Problem:   CAP (community acquired pneumonia) Active Problems:   HYPERTENSION, BENIGN   COPD (chronic obstructive pulmonary disease)   Generalized anxiety disorder   Insomnia   Headache(784.0)   Discharge Condition: stable with productive cough  Diet recommendation: heart healthy  Filed Weights   01/15/14 2340 01/16/14 0539  Weight: 70.308 kg (155 lb) 70.035 kg (154 lb 6.4 oz)    History of present illness:  Pt is a 80 yo female with a prior history of asthma (bronchiectasis per her report?), hypertension, and diverticulitis who has been admitted for CAP in right middle lobe.  On admission she had a non productive cough and was very fatigued.  She reports having pneumonia frequently years ago.    Hospital Course:  CAP (community acquired pneumonia)  - Admitted due to hypoxia, desating into the 80s on room air, now in the 90s on room air.  - She continued to have fever of 102+ on 9/21 pm, but WBC normalized. Cough is now productive.  9/23 no fever for over 24 hrs. - Initially treated with Rocephin and Azithromycin.  Progressed to Levaquin on 9/22. - Received supportive treatment with Delsym, nebs and Mucinex.  - Gram stain shows few gram negative rods and rare gram positive cocci on 9/22. Sputum culture is still pending  - Urine negative for legionella and strep pneumo.  - Incentive spirometry  - On 9/23 breathing much improved.  Productive cough.  Headache from cough.  Will d/c home with 5 additional days of levaquin.  COPD  - Saturating >92% on room air. Denies SOB.  - Received Proventil and  Duoneb prn.  - Will refill albuterol inhaler on d/c.  Abdominal Pain  -Resolved.  -Right sided. Patient has intermittent problems with abdominal pain. Abdominal CT is negative.  -Felt to be due to cough, as the pain worsened after the cough started.  -On Miralax for mild constipation.  -Pt had bowel movement on 9/22.   Hypertension  - Stable. Continue Metoprolol 25 mg daily.  - Currently well controlled.   Headache  - Complains of right sided intermittent temporal headache on 9/22.  - Likely due to coughing. - No nausea, vomiting, visual changes. Treat with Tylenol prn.   Anxiety/Insomnia  - Stable. Continue Zoloft daily.    Procedures:  none  Consultations:  none  Discharge Exam:  Filed Vitals:   01/17/14 2106 01/18/14 0521 01/18/14 0554 01/18/14 0934  BP: 136/74 128/47 136/69 137/80  Pulse: 75 70  88  Temp: 98.1 F (36.7 C) 98.4 F (36.9 C)    TempSrc: Oral Oral    Resp: 18 18    Height:      Weight:      SpO2: 94% 96%       General: Well developed, well nourished female, lying in bed, NAD HEENT: Anicteic Sclera, MMM.  Neck: Supple, no JVD, no masses  Cardiovascular: RRR, S1 S2 auscultated, no rubs, murmurs or gallops.  Respiratory: CTA, no accessory muscle use. Equal chest rise  Abdomen: Soft, nontender, nondistended, + bowel sounds  Extremities: warm dry without cyanosis clubbing or edema.  Neuro: AAOx3, cranial nerves grossly intact.  Strength 5/5 in upper and lower extremities  Psych: Normal affect and demeanor with intact judgement and insight    Discharge Instructions   Discharge Instructions   Diet - low sodium heart healthy    Complete by:  As directed      Increase activity slowly    Complete by:  As directed           Current Discharge Medication List    START taking these medications   Details  albuterol (PROVENTIL HFA;VENTOLIN HFA) 108 (90 BASE) MCG/ACT inhaler Inhale 2 puffs into the lungs every 6 (six) hours as needed for  wheezing or shortness of breath. Qty: 1 Inhaler, Refills: 2    chlorpheniramine-HYDROcodone (TUSSIONEX PENNKINETIC ER) 10-8 MG/5ML LQCR Take 5 mLs by mouth at bedtime as needed for cough. Qty: 115 mL, Refills: 0    guaiFENesin (MUCINEX) 600 MG 12 hr tablet Take 2 tablets (1,200 mg total) by mouth 2 (two) times daily.    levofloxacin (LEVAQUIN) 500 MG tablet Take 1 tablet (500 mg total) by mouth daily. Qty: 5 tablet, Refills: 0    polyethylene glycol powder (GLYCOLAX/MIRALAX) powder Take 1 capful (17 g) daily in 1/2 (4 ounces) of liquid.  Skip a day if you have diarrhea. Qty: 255 g, Refills: 6      CONTINUE these medications which have NOT CHANGED   Details  calcium carbonate 1250 MG capsule Take 1,250 mg by mouth daily.     Cholecalciferol (VITAMIN D3) 1000 UNITS tablet Take 1,000 Units by mouth daily.      metoprolol succinate (TOPROL-XL) 25 MG 24 hr tablet Take 25 mg by mouth daily.    Multiple Vitamin (MULTIVITAMIN) tablet Take 1 tablet by mouth daily.      Multiple Vitamins-Minerals (PRESERVISION/LUTEIN) CAPS Take 1 capsule by mouth daily.     omeprazole (PRILOSEC) 20 MG capsule Take 1 capsule (20 mg total) by mouth daily. Qty: 30 capsule, Refills: 6   Associated Diagnoses: Diverticulosis; Constipation; GERD (gastroesophageal reflux disease)    Pyridoxine HCl (VITAMIN B-6) 500 MG tablet Take 500 mg by mouth daily.      sertraline (ZOLOFT) 50 MG tablet Take 50 mg by mouth daily.    vitamin B-12 (CYANOCOBALAMIN) 1000 MCG tablet Take 1,000 mcg by mouth daily.      zolpidem (AMBIEN) 5 MG tablet Take 5-10 mg by mouth at bedtime as needed for sleep.      STOP taking these medications     estradiol (ESTRACE) 0.1 MG/GM vaginal cream        Allergies  Allergen Reactions  . Doxycycline Other (See Comments)    Blisters on mouth and lips  . Pravastatin     Fatigued.   . Simvastatin Other (See Comments)    Myalgias and memory loss  . Trazodone And Nefazodone Other (See  Comments)    Made her hyper   Follow-up Information   Follow up with METHENEY,CATHERINE, MD In 2 weeks.   Specialty:  Family Medicine   Contact information:   [address] [place] [address] [phone]        The results of significant diagnostics from this hospitalization (including imaging, microbiology, ancillary and laboratory) are listed below for reference.    Significant Diagnostic Studies: Dg Chest 2 View  01/16/2014   CLINICAL DATA:  Epigastric pain, diarrhea, muscle cramping. Cough and fever.  EXAM: CHEST  2 VIEW  COMPARISON:  Chest radiograph May 10, 2013  FINDINGS: Cardiac silhouette is mildly  enlarged, mediastinal silhouette is nonsuspicious. Similar chronic interstitial changes. RIGHT midlung zone and bibasilar strandy densities. No pleural effusions or focal consolidation. No pneumothorax.  Moderate degenerate change of thoracic spine. Patient is osteopenic. Deformity of the LEFT clavicle at suggest remote injury.  IMPRESSION: Mild cardiomegaly. Chronic interstitial changes with RIGHT midlung zone and bibasilar atelectasis.   Electronically Signed   By: Elon Alas   On: 01/16/2014 01:41   Ct Abdomen Pelvis W Contrast  01/16/2014   CLINICAL DATA:  Left lower quadrant pain with fever. Initial encounter  EXAM: CT ABDOMEN AND PELVIS WITH CONTRAST  TECHNIQUE: Multidetector CT imaging of the abdomen and pelvis was performed using the standard protocol following bolus administration of intravenous contrast.  CONTRAST:  155mL OMNIPAQUE IOHEXOL 300 MG/ML  SOLN  COMPARISON:  None.  FINDINGS: BODY WALL: Small fatty umbilical hernia.  LOWER CHEST: Patchy airspace disease in the right lower lobe with focal bronchial wall thickening.  Moderate size sliding-type hiatal hernia.  ABDOMEN/PELVIS:  Liver: No focal abnormality.  Biliary: No evidence of biliary obstruction or stone.  Pancreas: Unremarkable.  Spleen: Unremarkable.  Adrenals: Unremarkable.  Kidneys  and ureters: No hydronephrosis or stone. 11 mm cyst in the upper pole left kidney.  Bladder: Unremarkable.  Reproductive: Hysterectomy.  Bowel: Extensive colonic diverticulosis. No active inflammation. No bowel obstruction. Moderate sliding-type hiatal hernia. Negative appendix.  Retroperitoneum: No mass or adenopathy.  Peritoneum: No ascites or pneumoperitoneum.  Vascular: No acute abnormality.  OSSEOUS: No acute abnormalities. Right S2 Tarlov cyst. Advanced and diffuse degenerative disc and facet disease with grade 1 L4-5 slip. Remote T11 superior endplate fracture.  IMPRESSION: 1. No acute intra-abdominal findings. 2. Right lower lobe pneumonia. 3. Extensive colonic diverticulosis. 4. Moderate hiatal hernia.   Electronically Signed   By: Jorje Guild M.D.   On: 01/16/2014 03:28    Microbiology: Recent Results (from the past 240 hour(s))  CULTURE, BLOOD (ROUTINE X 2)     Status: None   Collection Time    01/16/14  4:47 AM      Result Value Ref Range Status   Specimen Description BLOOD RIGHT ARM   Final   Special Requests BOTTLES DRAWN AEROBIC AND ANAEROBIC 5CC EACH   Final   Culture  Setup Time     Final   Value: 01/16/2014 09:41     Performed at [facility]   Culture     Final   Value:        BLOOD CULTURE RECEIVED NO GROWTH TO DATE CULTURE WILL BE HELD FOR 5 DAYS BEFORE ISSUING A FINAL NEGATIVE REPORT     Performed at [facility]   Report Status PENDING   Incomplete  CULTURE, BLOOD (ROUTINE X 2)     Status: None   Collection Time    01/16/14  4:52 AM      Result Value Ref Range Status   Specimen Description BLOOD RIGHT WRIST   Final   Special Requests BOTTLES DRAWN AEROBIC AND ANAEROBIC 5CC   Final   Culture  Setup Time     Final   Value: 01/16/2014 09:41     Performed at [facility]   Culture     Final   Value:        BLOOD CULTURE RECEIVED NO GROWTH TO DATE CULTURE WILL BE HELD FOR 5 DAYS BEFORE ISSUING A FINAL NEGATIVE REPORT     Performed at [facility]   Report Status PENDING   Incomplete  CULTURE, EXPECTORATED SPUTUM-ASSESSMENT     Status: None   Collection Time    01/16/14  5:07 PM      Result Value Ref Range Status   Specimen Description SPUTUM   Final   Special Requests NONE   Final   Sputum evaluation     Final   Value: THIS SPECIMEN IS ACCEPTABLE. RESPIRATORY CULTURE REPORT TO FOLLOW.   Report Status 01/16/2014 FINAL   Final  CULTURE, RESPIRATORY (NON-EXPECTORATED)     Status: None   Collection Time    01/16/14  5:07 PM      Result Value Ref Range Status   Specimen Description SPUTUM   Final   Special Requests NONE   Final   Gram Stain     Final   Value: MODERATE WBC PRESENT,BOTH PMN AND MONONUCLEAR     RARE SQUAMOUS EPITHELIAL CELLS PRESENT     FEW GRAM NEGATIVE RODS     RARE GRAM POSITIVE COCCI     IN PAIRS   Culture     Final   Value: NORMAL OROPHARYNGEAL FLORA     Performed at [facility]   Report Status 01/18/2014 FINAL   Final     Labs: Basic Metabolic Panel:  Recent Labs Lab 01/15/14 2359 01/17/14 0815  NA 134* 135*  K 4.3 4.2  CL 99 98  CO2 23 26  GLUCOSE 129* 105*  BUN 10 7  CREATININE 0.61 0.61  CALCIUM 9.3 8.8   Liver Function Tests:  Recent Labs Lab 01/15/14 2359  AST 19  ALT 13  ALKPHOS 81  BILITOT 0.4  PROT 7.1  ALBUMIN 3.7    Recent Labs Lab 01/15/14 2359  LIPASE 17   CBC:  Recent Labs Lab 01/15/14 2359 01/17/14 0815  WBC 12.3* 9.7  NEUTROABS 10.4*  --   HGB 13.5 11.5*  HCT 40.3 35.2*  MCV 91.6 91.0  PLT 213 188     SignedKaren Kitchens [phone]  [facility] 01/18/2014, 10:10 AM

## 2014-01-18 NOTE — Progress Notes (Signed)
[place], PA made aware of IV infiltration and pt only receiving 176ml of zithromax IVPB abx. Order to d/c IV and not place new access. Oral abx orders received.

## 2014-01-22 LAB — CULTURE, BLOOD (ROUTINE X 2)
CULTURE: NO GROWTH
Culture: NO GROWTH

## 2014-01-26 ENCOUNTER — Ambulatory Visit (INDEPENDENT_AMBULATORY_CARE_PROVIDER_SITE_OTHER): Payer: Medicare Other | Admitting: Family Medicine

## 2014-01-26 ENCOUNTER — Encounter: Payer: Self-pay | Admitting: Family Medicine

## 2014-01-26 VITALS — BP 126/74 | HR 70 | Temp 97.5°F | Ht 63.0 in | Wt 152.0 lb

## 2014-01-26 DIAGNOSIS — J189 Pneumonia, unspecified organism: Secondary | ICD-10-CM

## 2014-01-26 DIAGNOSIS — B370 Candidal stomatitis: Secondary | ICD-10-CM

## 2014-01-26 MED ORDER — NYSTATIN 100000 UNIT/ML MT SUSP: 5.0000 mL | Freq: Four times a day (QID) | OROMUCOSAL | Status: DC

## 2014-01-26 NOTE — Progress Notes (Signed)
   Subjective:    Patient ID: Beth [name], female    DOB: 01-29-1933, 80 y.o.   MRN: [id number]  HPI Hospitalization Follow-up for PNA from [place].  D/C home on 01/16/14. Completes her levaquin yesterday.  Has had some nausea. No vomiting. No fever.  Has had some loose stool but she has also been taking MiraLax to help her bowels moved and she was told that she had a fair amount of stool in her colon while she was in the hospital.  Thinks may have been developing thrush, but says it is much better.  Says her tongue was coated but she used a tongue scraper to remove it. .  She felt hyper yesterday adn couldn't sleep last night. Still having some sweats.    Review of Systems     Objective:   Physical Exam  Constitutional: She is oriented to person, place, and time. She appears well-developed and well-nourished.  HENT:  Head: Normocephalic and atraumatic.  Cardiovascular: Normal rate, regular rhythm and normal heart sounds.   Pulmonary/Chest: Effort normal and breath sounds normal.  Neurological: She is alert and oriented to person, place, and time.  Skin: Skin is warm and dry.  Psychiatric: She has a normal mood and affect. Her behavior is normal.          Assessment & Plan:  PNA - Due for repeat cmp,, CBC, and repeat CXR in 4 weeks. I did verify the blood cultures done the hospital were negative. CBC should have normalized by now and then we'll repeat a chest x-ray around October 23. If his bowels are not getting better over the next week then please let me know . Make sure drinking plenty of fluid.  Thrush-right now her tongue looks pretty good but she has been scraping it. She says is still tender on the tip. I will go ahead and treat with nystatin swish and swallow.

## 2014-01-26 NOTE — Patient Instructions (Signed)
Please for for a chest xray around 02/17/14.   Call if stools are not improving.   Make sure drinking plenty of water.

## 2014-01-27 LAB — BASIC METABOLIC PANEL WITH GFR
BUN: 10 mg/dL (ref 6–23)
CALCIUM: 9.7 mg/dL (ref 8.4–10.5)
CO2: 28 meq/L (ref 19–32)
Chloride: 102 mEq/L (ref 96–112)
Creat: 0.66 mg/dL (ref 0.50–1.10)
GFR, Est African American: >89 mL/min
GFR, Est Non African American: 84 mL/min
Glucose, Bld: 105 mg/dL — ABNORMAL HIGH (ref 70–99)
Potassium: 4.6 mEq/L (ref 3.5–5.3)
SODIUM: 135 meq/L (ref 135–145)

## 2014-01-27 LAB — CBC WITH DIFFERENTIAL/PLATELET
Basophils Absolute: 0.1 10*3/uL (ref 0.0–0.1)
Basophils Relative: 1 % (ref 0–1)
EOS ABS: 0.3 10*3/uL (ref 0.0–0.7)
Eosinophils Relative: 5 % (ref 0–5)
HCT: 37.2 % (ref 36.0–46.0)
Hemoglobin: 12.3 g/dL (ref 12.0–15.0)
LYMPHS ABS: 1.1 10*3/uL (ref 0.7–4.0)
Lymphocytes Relative: 21 % (ref 12–46)
MCH: 29.6 pg (ref 26.0–34.0)
MCHC: 33.1 g/dL (ref 30.0–36.0)
MCV: 89.6 fL (ref 78.0–100.0)
MONOS PCT: 9 % (ref 3–12)
Monocytes Absolute: 0.5 10*3/uL (ref 0.1–1.0)
Neutro Abs: 3.3 10*3/uL (ref 1.7–7.7)
Neutrophils Relative %: 64 % (ref 43–77)
Platelets: 346 10*3/uL (ref 150–400)
RBC: 4.15 MIL/uL (ref 3.87–5.11)
RDW: 13.5 % (ref 11.5–15.5)
WBC: 5.2 10*3/uL (ref 4.0–10.5)

## 2014-01-28 ENCOUNTER — Other Ambulatory Visit: Payer: Self-pay | Admitting: Family Medicine

## 2014-01-29 ENCOUNTER — Other Ambulatory Visit: Payer: Self-pay | Admitting: Family Medicine

## 2014-01-30 ENCOUNTER — Ambulatory Visit: Payer: Medicare Other | Admitting: Family Medicine

## 2014-02-02 ENCOUNTER — Other Ambulatory Visit: Payer: Self-pay | Admitting: Family Medicine

## 2014-02-15 ENCOUNTER — Other Ambulatory Visit: Payer: Self-pay | Admitting: Family Medicine

## 2014-02-17 ENCOUNTER — Other Ambulatory Visit: Payer: Self-pay

## 2014-02-17 ENCOUNTER — Ambulatory Visit (INDEPENDENT_AMBULATORY_CARE_PROVIDER_SITE_OTHER): Payer: Medicare Other

## 2014-02-17 ENCOUNTER — Other Ambulatory Visit: Payer: Self-pay | Admitting: Family Medicine

## 2014-02-17 DIAGNOSIS — J189 Pneumonia, unspecified organism: Secondary | ICD-10-CM

## 2014-02-17 MED ORDER — AMOXICILLIN-POT CLAVULANATE 875-125 MG PO TABS: 1.0000 | ORAL_TABLET | Freq: Two times a day (BID) | ORAL | Status: DC

## 2014-02-17 MED ORDER — AZITHROMYCIN 250 MG PO TABS: ORAL_TABLET | ORAL | Status: DC

## 2014-02-20 ENCOUNTER — Other Ambulatory Visit: Payer: Self-pay | Admitting: Family Medicine

## 2014-02-20 MED ORDER — CEFDINIR 300 MG PO CAPS: 300.0000 mg | ORAL_CAPSULE | Freq: Two times a day (BID) | ORAL | Status: DC

## 2014-03-06 ENCOUNTER — Other Ambulatory Visit: Payer: Self-pay | Admitting: Family Medicine

## 2014-03-08 ENCOUNTER — Ambulatory Visit: Payer: Medicare Other | Admitting: Family Medicine

## 2014-03-10 ENCOUNTER — Other Ambulatory Visit: Payer: Self-pay | Admitting: Family Medicine

## 2014-03-14 ENCOUNTER — Ambulatory Visit (INDEPENDENT_AMBULATORY_CARE_PROVIDER_SITE_OTHER): Payer: Medicare Other | Admitting: Family Medicine

## 2014-03-14 ENCOUNTER — Other Ambulatory Visit: Payer: Self-pay | Admitting: Family Medicine

## 2014-03-14 ENCOUNTER — Encounter: Payer: Self-pay | Admitting: Family Medicine

## 2014-03-14 VITALS — BP 142/81 | HR 73 | Temp 97.5°F | Wt 153.0 lb

## 2014-03-14 DIAGNOSIS — G4700 Insomnia, unspecified: Secondary | ICD-10-CM

## 2014-03-14 DIAGNOSIS — J189 Pneumonia, unspecified organism: Secondary | ICD-10-CM

## 2014-03-14 MED ORDER — SERTRALINE HCL 50 MG PO TABS: 50.0000 mg | ORAL_TABLET | Freq: Every day | ORAL | Status: DC

## 2014-03-14 MED ORDER — ZOLPIDEM TARTRATE 5 MG PO TABS: ORAL_TABLET | ORAL | Status: DC

## 2014-03-14 NOTE — Patient Instructions (Signed)
Go to imaging for your chest xray in 1-2 weeks.

## 2014-03-14 NOTE — Progress Notes (Signed)
   Subjective:    Patient ID: Beth [name], female    DOB: 1932/08/16, 80 y.o.   MRN: [id number]  HPI Dx with Pneumonia 6 weeks ago.  We repeated her CXR about 3 weeks ago and it was worse so called in another antibiotic.  She has completed all her antiobitics. She feels much better overall.  NO fever, SOB, or chest pain.    Husband has been in the hospital and this has been stressful. He is in a SNIF now.  She just feels overwhelmed but say sshe will be ok.    Insomnia - She has been using some melatonin. Says has been using whole tab of ambien 5mg  daily.    Review of Systems     Objective:   Physical Exam  Constitutional: She is oriented to person, place, and time. She appears well-developed and well-nourished.  HENT:  Head: Normocephalic and atraumatic.  Cardiovascular: Normal rate, regular rhythm and normal heart sounds.   Pulmonary/Chest: Effort normal and breath sounds normal.  Neurological: She is alert and oriented to person, place, and time.  Skin: Skin is warm and dry.  Psychiatric: She has a normal mood and affect. Her behavior is normal.          Assessment & Plan:  Pneumonia - will repeat CXR in 1-2 weeks. Ordered.  Recommend up-to-date flu vaccine as well as Prevnar 13 that she is feeling much better now.  Insomnia-okay to use melatonin if it's working well. I did go ahead and send over 5 mg nightly of the Ambien for 30 tabs. But warned her that her insurance may not pay for 30 tabs per month. If they do not cover it could always consider trazodone again but maybe try a higher dose. She did try the trazodone over the summer but I think the maximum dose she tried was 100 mg. He denies any excess sedation with the medication. She says she doesn't take anything for sleep she usually only gets 3-4 hours.  Flu shot given.    She will check with insurance for the [place] 13.

## 2014-03-29 ENCOUNTER — Ambulatory Visit (INDEPENDENT_AMBULATORY_CARE_PROVIDER_SITE_OTHER): Payer: Medicare Other

## 2014-03-29 DIAGNOSIS — J189 Pneumonia, unspecified organism: Secondary | ICD-10-CM

## 2014-04-05 ENCOUNTER — Ambulatory Visit (INDEPENDENT_AMBULATORY_CARE_PROVIDER_SITE_OTHER): Payer: Medicare Other | Admitting: Physician Assistant

## 2014-04-05 ENCOUNTER — Encounter: Payer: Self-pay | Admitting: Physician Assistant

## 2014-04-05 VITALS — BP 130/77 | HR 87 | Temp 97.7°F | Ht 63.0 in | Wt 155.0 lb

## 2014-04-05 DIAGNOSIS — R062 Wheezing: Secondary | ICD-10-CM

## 2014-04-05 DIAGNOSIS — J189 Pneumonia, unspecified organism: Secondary | ICD-10-CM

## 2014-04-05 DIAGNOSIS — R0602 Shortness of breath: Secondary | ICD-10-CM

## 2014-04-05 MED ORDER — METHYLPREDNISOLONE ACETATE 40 MG/ML IJ SUSP
40.0000 mg | Freq: Once | INTRAMUSCULAR | Status: AC
  Administered 2014-04-05: 40 mg via INTRAMUSCULAR

## 2014-04-05 MED ORDER — CEFTRIAXONE SODIUM 1 G IJ SOLR
1.0000 g | Freq: Once | INTRAMUSCULAR | Status: AC
  Administered 2014-04-05: 1 g via INTRAMUSCULAR

## 2014-04-05 MED ORDER — IPRATROPIUM-ALBUTEROL 0.5-2.5 (3) MG/3ML IN SOLN: 3.0000 mL | RESPIRATORY_TRACT | Status: DC | PRN

## 2014-04-05 MED ORDER — IPRATROPIUM-ALBUTEROL 0.5-2.5 (3) MG/3ML IN SOLN
3.0000 mL | RESPIRATORY_TRACT | Status: DC
  Administered 2014-04-05: 3 mL via RESPIRATORY_TRACT

## 2014-04-05 MED ORDER — LEVOFLOXACIN 750 MG PO TABS: 750.0000 mg | ORAL_TABLET | Freq: Every day | ORAL | Status: DC

## 2014-04-05 MED ORDER — METHYLPREDNISOLONE SODIUM SUCC 125 MG IJ SOLR
125.0000 mg | Freq: Once | INTRAMUSCULAR | Status: AC
  Administered 2014-04-05: 125 mg via INTRAMUSCULAR

## 2014-04-05 NOTE — Patient Instructions (Signed)

## 2014-04-05 NOTE — Progress Notes (Signed)
   Subjective:    Patient ID: Beth [name], female    DOB: January 29, 1933, 80 y.o.   MRN: [id number]  HPI  Patient is a 80 year old female who presents to the clinic with cough, shortness of breath, wheezing. She is currently having a lot of problems breathing. She has a history of COPD and community-acquired pneumonia. Her last hospitalization was 01/16/14 for pneumonia. She also had pneumonia diagnosed by chest x-ray on 02/17/2014. Dr. Charise Carwin has been following her closely and last chest x-ray on 03/29/14 showed pneumonia resolving. Patient reports she felt fine last Wednesday. It was late Friday afternoon that she started having increased cough and mucus production. She does admit that her bathroom is being worked on at her house and there was a lot of dust accumulation on Friday. Symptoms have continued to worsen for the last 5 days. She denies any fever but she's had lots of sputum production. Last night she did not sleep at all for coughing and struggling to breathe. She has been using her albuterol inhaler but she is less feeling like she needs more.     Review of Systems  All other systems reviewed and are negative.      Objective:   Physical Exam  Constitutional: She is oriented to person, place, and time. She appears well-developed and well-nourished.  HENT:  Head: Normocephalic and atraumatic.  Cardiovascular: Normal rate, regular rhythm and normal heart sounds.   Pulmonary/Chest:  Bilateral lung expiratory and inspiratory wheezing.  Crackles heard over right lower lung. Coarse breath sounds.  Pt appears labored to give history.    Neurological: She is alert and oriented to person, place, and time.  Skin: Skin is dry.  Psychiatric: She has a normal mood and affect. Her behavior is normal.          Assessment & Plan:  CAP- lung exam consistent with pneumonia.opted not to do xray due to so many being done and treatment plan not changing.  dubneb given today. Pt reports some  improvement. Pulse went from 92-93percent. Pt was sent home with nebulizer and solution to use every 2-4 hours as needed. Solumedrol 125mg , depo medrol 40mg  and rocephin 1g given IM today. Pt started on oral levaquin 750 for 5 days. Pt is to follow up with myself or Dr. Madilyn Fireman on Friday.

## 2014-04-07 ENCOUNTER — Ambulatory Visit (INDEPENDENT_AMBULATORY_CARE_PROVIDER_SITE_OTHER): Payer: Medicare Other | Admitting: Family Medicine

## 2014-04-07 ENCOUNTER — Encounter: Payer: Self-pay | Admitting: Family Medicine

## 2014-04-07 VITALS — BP 154/94 | HR 93 | Temp 97.5°F | Wt 155.0 lb

## 2014-04-07 DIAGNOSIS — J189 Pneumonia, unspecified organism: Secondary | ICD-10-CM

## 2014-04-07 DIAGNOSIS — J441 Chronic obstructive pulmonary disease with (acute) exacerbation: Secondary | ICD-10-CM

## 2014-04-07 MED ORDER — HYDROCODONE-HOMATROPINE 5-1.5 MG/5ML PO SYRP: 5.0000 mL | ORAL_SOLUTION | Freq: Every evening | ORAL | Status: DC | PRN

## 2014-04-07 MED ORDER — BUDESONIDE-FORMOTEROL FUMARATE 160-4.5 MCG/ACT IN AERO: 2.0000 | INHALATION_SPRAY | Freq: Two times a day (BID) | RESPIRATORY_TRACT | Status: DC

## 2014-04-07 NOTE — Progress Notes (Signed)
   Subjective:    Patient ID: Beth [name], female    DOB: [birth year], 80 y.o.   MRN: [id number]  HPI Here for follow-up COPD exacerbation. She was seen approximately 3 days ago She actually is feeling a little bit better today. Feeling a little less tight and congested in her chest. She has a history of recent pneumonia about 2 months ago. She had a repeat chest x-ray in November that showed that the infection was clearing.  Review of Systems     Objective:   Physical Exam  Constitutional: She is oriented to person, place, and time. She appears well-developed and well-nourished.  HENT:  Head: Normocephalic and atraumatic.  Right Ear: External ear normal.  Left Ear: External ear normal.  Nose: Nose normal.  Mouth/Throat: Oropharynx is clear and moist.  TMs and canals are clear.   Eyes: Conjunctivae and EOM are normal. Pupils are equal, round, and reactive to light.  Neck: Neck supple. No thyromegaly present.  Cardiovascular: Normal rate, regular rhythm and normal heart sounds.   Pulmonary/Chest: Effort normal and breath sounds normal. She has no wheezes.  Lymphadenopathy:    She has no cervical adenopathy.  Neurological: She is alert and oriented to person, place, and time.  Skin: Skin is warm and dry.  Psychiatric: She has a normal mood and affect.          Assessment & Plan:  COPD exacerbation/early PNA - she actually has some mild improvement today. We'll start Symbicort 2 puffs twice a day. Continue current regimen per make sure to complete the about. Cough syrup provided for bedtime. Follow-up in one month for spirometry.  Pulse ox is improving

## 2014-05-01 ENCOUNTER — Ambulatory Visit (INDEPENDENT_AMBULATORY_CARE_PROVIDER_SITE_OTHER): Payer: Medicare Other | Admitting: Family Medicine

## 2014-05-01 ENCOUNTER — Encounter: Payer: Self-pay | Admitting: Family Medicine

## 2014-05-01 VITALS — BP 122/70 | HR 95 | Temp 97.9°F | Ht 63.0 in | Wt 152.0 lb

## 2014-05-01 DIAGNOSIS — F4321 Adjustment disorder with depressed mood: Secondary | ICD-10-CM

## 2014-05-01 DIAGNOSIS — J441 Chronic obstructive pulmonary disease with (acute) exacerbation: Secondary | ICD-10-CM

## 2014-05-01 DIAGNOSIS — J209 Acute bronchitis, unspecified: Secondary | ICD-10-CM

## 2014-05-01 DIAGNOSIS — K5901 Slow transit constipation: Secondary | ICD-10-CM

## 2014-05-01 DIAGNOSIS — G4700 Insomnia, unspecified: Secondary | ICD-10-CM

## 2014-05-01 MED ORDER — HYDROCODONE-HOMATROPINE 5-1.5 MG/5ML PO SYRP: 5.0000 mL | ORAL_SOLUTION | Freq: Every evening | ORAL | Status: DC | PRN

## 2014-05-01 MED ORDER — AZITHROMYCIN 250 MG PO TABS: ORAL_TABLET | ORAL | Status: DC

## 2014-05-01 MED ORDER — TRAZODONE HCL 100 MG PO TABS: 100.0000 mg | ORAL_TABLET | Freq: Every day | ORAL | Status: DC

## 2014-05-01 MED ORDER — PREDNISONE 20 MG PO TABS: 40.0000 mg | ORAL_TABLET | Freq: Every day | ORAL | Status: DC

## 2014-05-01 NOTE — Progress Notes (Signed)
Subjective:    Patient ID: Beth [name], female    DOB: 1933-04-07, 81 y.o.   MRN: [id number]  HPI  Seen approximately 3 weeks ago for COPD exacerbation. She actually just had pneumonia about 2 months ago. She still just feeling sore in her chest. And now she's getting a burning sensation in her epigastrium down to her rectum. She is taking her Prilosec. She does feel little constipated. She does use her MiraLAX when needed.  Using her Duoneb BID. Can't do more than that or makes her ver anxious.  Says cough was getting better but now last night felt she was getting wrose. Some nasal congestion and post nasal drip as well.   Her husband came home for 2 weeks and then died at home last 08-22-22. They had his memorial service Saturday morning and then had a private family service at the grave site this morning.  Insomnia - She says her insurance will no longer cover the Ambien. She says she like to try the trazodone again. She tried it over the summer but wasn't sure how effective it really was. She had tried the 100 mg dose at that time.  Review of Systems     Objective:   Physical Exam  Constitutional: She is oriented to person, place, and time. She appears well-developed and well-nourished.  HENT:  Head: Normocephalic and atraumatic.  Right Ear: External ear normal.  Left Ear: External ear normal.  Nose: Nose normal.  Mouth/Throat: Oropharynx is clear and moist.  TMs and canals are clear.   Eyes: Conjunctivae and EOM are normal. Pupils are equal, round, and reactive to light.  Neck: Neck supple. No thyromegaly present.  Cardiovascular: Normal rate, regular rhythm and normal heart sounds.   Pulmonary/Chest: Effort normal and breath sounds normal.  Diffuse rhonchi. More prominent in the upper lobes bilaterally.  Lymphadenopathy:    She has no cervical adenopathy.  Neurological: She is alert and oriented to person, place, and time.  Skin: Skin is warm and dry.  Psychiatric: She  has a normal mood and affect.          Assessment & Plan:  COPD exacerbation/acute bronchitis-we'll go ahead and put her on azithromycin and prednisone. Like to see her back in 2 weeks to make sure that she's improving. She had a course of Levaquin about a month ago, maybe 6 weeks ago. If she gets worse and she's to call us back and let us know. Can always get repeat chest x-ray if needed. She had one done about 4 weeks ago that was showing improvement from her previous pneumonia. Encouraged her to continue to do her new DuoNeb twice a day as tolerated.  Purvis Sheffield does have a lot of support from her family. But I did encourage her to rest. She feels very overwhelmed and says she has really not been taking care of herself over the last week. Next  Insomnia-still not sleeping well. Her insurance will no longer pay for the Ambien. We'll try trazodone again and increase up to 300 mg as tolerated. If that still not effective then consider a trial of Belsomra though it may be more costly.  Constipation-she was using MiraLAX fairly regularly at one point in time and then started to get some diarrhea. Encouraged her to restart it until she has a soft bowel movement and then try maybe taking it every other day or every 2-3 days consistently to help keep bowels soft. I don't recommend using it just as  a rescue medication especially with chronic constipation. We also discussed that there are 2 prescription medications on the market that could be very helpful but neither comes generic and this could be costly.

## 2014-05-04 ENCOUNTER — Other Ambulatory Visit: Payer: Self-pay | Admitting: Family Medicine

## 2014-05-09 ENCOUNTER — Other Ambulatory Visit: Payer: Medicare Other | Admitting: Family Medicine

## 2014-05-11 ENCOUNTER — Ambulatory Visit (INDEPENDENT_AMBULATORY_CARE_PROVIDER_SITE_OTHER): Payer: Medicare Other | Admitting: Family Medicine

## 2014-05-11 ENCOUNTER — Encounter: Payer: Self-pay | Admitting: Family Medicine

## 2014-05-11 VITALS — BP 107/65 | HR 92 | Ht 63.0 in | Wt 150.0 lb

## 2014-05-11 DIAGNOSIS — G4700 Insomnia, unspecified: Secondary | ICD-10-CM

## 2014-05-11 DIAGNOSIS — J449 Chronic obstructive pulmonary disease, unspecified: Secondary | ICD-10-CM

## 2014-05-11 MED ORDER — AMITRIPTYLINE HCL 25 MG PO TABS: 25.0000 mg | ORAL_TABLET | Freq: Every day | ORAL | Status: DC

## 2014-05-11 MED ORDER — ALBUTEROL SULFATE (2.5 MG/3ML) 0.083% IN NEBU: 2.5000 mg | INHALATION_SOLUTION | Freq: Once | RESPIRATORY_TRACT | Status: DC

## 2014-05-11 NOTE — Progress Notes (Signed)
   Subjective:    Patient ID: Beth [name], female    DOB: 1932-11-09, 81 y.o.   MRN: [id number]  HPI Here for spirometry. Had pneumonia about 2 months ago and then had a recent COPD exacerbation.  Had had SOB, wheezing and cough for 2 months now. Though today she says she is finally starting to feel better. Says her cough has lessened and feels has more energy.  She did admit to using her albuterol in the car on the way here.   Insomnia- Says the trazodone makes her feel "weird". Says after she takes it and then is lying in bed trying to fall alseep she feels like it makes her hyper. Says it really isn't helping her sleep anyway. Has tried 100mg  of trazodone.  She tried her deceased husband old amitryptiline a few nights and say it worked great. She would like a rx for this.  She is unsure of the dose she tried.   Review of Systems     Objective:   Physical Exam  Constitutional: She is oriented to person, place, and time. She appears well-developed and well-nourished.  HENT:  Head: Normocephalic and atraumatic.  Cardiovascular: Normal rate, regular rhythm and normal heart sounds.   Pulmonary/Chest: Effort normal and breath sounds normal.  Neurological: She is alert and oriented to person, place, and time.  Skin: Skin is warm and dry.  Psychiatric: She has a normal mood and affect. Her behavior is normal.          Assessment & Plan:  Insomnia - Discussed to not take Rx meds without calling her to make sure safe.  Will d/c trazodone and can try the amitryptiline. Start with 25mg  and can increase to 2 tabs if needed.  F/U in 6-8 weeks

## 2014-05-11 NOTE — Assessment & Plan Note (Signed)
Spirometry 05/11/14: FVC 87%, FEV1 91%, ratio of 77%.  No improvement w/ albuterol. Patient did say she took puff on her albuterol in her car on the way here.  Discussed that overall her spirometry looks great today.  I was concerned with her recent PNA and then COPD exacerbation. She is getting some better.

## 2014-05-15 ENCOUNTER — Ambulatory Visit: Payer: Self-pay | Admitting: Family Medicine

## 2014-06-14 ENCOUNTER — Encounter: Payer: Self-pay | Admitting: Family Medicine

## 2014-06-14 ENCOUNTER — Ambulatory Visit (INDEPENDENT_AMBULATORY_CARE_PROVIDER_SITE_OTHER): Payer: Medicare Other | Admitting: Family Medicine

## 2014-06-14 VITALS — BP 121/75 | HR 78 | Wt 154.0 lb

## 2014-06-14 DIAGNOSIS — G4700 Insomnia, unspecified: Secondary | ICD-10-CM

## 2014-06-14 NOTE — Progress Notes (Signed)
   Subjective:    Patient ID: Beth [name], female    DOB: 02/06/1933, 81 y.o.   MRN: [id number]  HPI Insomnia - dong well on the amitryptiline. No SE.  Still using one tab and seems to be working well. Says her breathing has been better so that has helped bc not using albuterol as much. Getting 6- 8 hours most night.  Taking med right after supper. She did on vacation to floor with her sister and says that that was very helpful in reducing her stress. In fact she actually slept much better.  Review of Systems     Objective:   Physical Exam  Constitutional: She is oriented to person, place, and time. She appears well-developed and well-nourished.  HENT:  Head: Normocephalic and atraumatic.  Cardiovascular: Normal rate, regular rhythm and normal heart sounds.   Pulmonary/Chest: Effort normal and breath sounds normal.  Neurological: She is alert and oriented to person, place, and time.  Skin: Skin is warm and dry.  Psychiatric: She has a normal mood and affect. Her behavior is normal.          Assessment & Plan:  Insomnia - currently well controlled with amitriptyline she's not having any side effects. Did encourage her to continue to work on diet and exercise and weight loss.

## 2014-08-05 ENCOUNTER — Other Ambulatory Visit: Payer: Self-pay | Admitting: Family Medicine

## 2014-08-09 ENCOUNTER — Other Ambulatory Visit: Payer: Self-pay | Admitting: Family Medicine

## 2014-08-09 ENCOUNTER — Ambulatory Visit (HOSPITAL_BASED_OUTPATIENT_CLINIC_OR_DEPARTMENT_OTHER)
Admission: RE | Admit: 2014-08-09 | Discharge: 2014-08-09 | Disposition: A | Discharge status: Home / Self Care | Payer: Medicare Other | Source: Ambulatory Visit | Attending: Family Medicine | Admitting: Family Medicine

## 2014-08-09 ENCOUNTER — Ambulatory Visit: Payer: Medicare Other

## 2014-08-09 ENCOUNTER — Ambulatory Visit (INDEPENDENT_AMBULATORY_CARE_PROVIDER_SITE_OTHER): Payer: Medicare Other | Admitting: Family Medicine

## 2014-08-09 ENCOUNTER — Encounter: Payer: Self-pay | Admitting: Family Medicine

## 2014-08-09 ENCOUNTER — Ambulatory Visit (INDEPENDENT_AMBULATORY_CARE_PROVIDER_SITE_OTHER): Payer: Medicare Other

## 2014-08-09 VITALS — BP 146/70 | HR 68 | Temp 97.6°F | Ht 63.0 in | Wt 155.0 lb

## 2014-08-09 DIAGNOSIS — R058 Other specified cough: Secondary | ICD-10-CM

## 2014-08-09 DIAGNOSIS — R053 Chronic cough: Secondary | ICD-10-CM

## 2014-08-09 DIAGNOSIS — R05 Cough: Secondary | ICD-10-CM

## 2014-08-09 LAB — CBC WITH DIFFERENTIAL/PLATELET
Basophils Absolute: 0.1 10*3/uL (ref 0.0–0.1)
Basophils Relative: 1 % (ref 0–1)
Eosinophils Absolute: 0.6 10*3/uL (ref 0.0–0.7)
Eosinophils Relative: 10 % — ABNORMAL HIGH (ref 0–5)
HCT: 39.6 % (ref 36.0–46.0)
Hemoglobin: 13.1 g/dL (ref 12.0–15.0)
LYMPHS PCT: 25 % (ref 12–46)
Lymphs Abs: 1.4 10*3/uL (ref 0.7–4.0)
MCH: 29.6 pg (ref 26.0–34.0)
MCHC: 33.1 g/dL (ref 30.0–36.0)
MCV: 89.4 fL (ref 78.0–100.0)
MONO ABS: 0.6 10*3/uL (ref 0.1–1.0)
MONOS PCT: 10 % (ref 3–12)
MPV: 11.7 fL (ref 8.6–12.4)
Neutro Abs: 3.1 10*3/uL (ref 1.7–7.7)
Neutrophils Relative %: 54 % (ref 43–77)
PLATELETS: 252 10*3/uL (ref 150–400)
RBC: 4.43 MIL/uL (ref 3.87–5.11)
RDW: 14 % (ref 11.5–15.5)
WBC: 5.7 10*3/uL (ref 4.0–10.5)

## 2014-08-09 MED ORDER — FLUTICASONE FUROATE-VILANTEROL 100-25 MCG/INH IN AEPB: 1.0000 | INHALATION_SPRAY | Freq: Every day | RESPIRATORY_TRACT | Status: DC

## 2014-08-09 MED ORDER — NYSTATIN 100000 UNIT/ML MT SUSP
5.0000 mL | Freq: Four times a day (QID) | OROMUCOSAL | Status: DC
Start: 2014-08-09 — End: 2014-12-15

## 2014-08-09 NOTE — Progress Notes (Signed)
   Subjective:    Patient ID: Beth [name], female    DOB: 1933-04-07, 81 y.o.   MRN: [id number]  HPI patient says she still having some mid back pain. Possibly started around the time she was diagnosed with pneumonia about 4 months ago in December. She's been taking 600 mg of ibuprofen. Pain is worse on the right side. She also complains of a dry cough but no shortness of breath. She's taking Mucinex daily and doing a nebulizer treatment every other day. No recent falls or injuries.she is taking claritin once a day.    She also completed a thick coating on her tongue. She says she's getting a lot of mucus in the back of her throat in the mornings that she feels is related. She says, sore to swallow at times. She's been taking a washcloth and scrubbing her time in the mornings.  Review of Systems     Objective:   Physical Exam  Constitutional: She is oriented to person, place, and time. She appears well-developed and well-nourished.  HENT:  Head: Normocephalic and atraumatic.  Right Ear: External ear normal.  Left Ear: External ear normal.  Nose: Nose normal.  Mouth/Throat: Oropharynx is clear and moist.  TMs and canals are clear. Thickened papilla on the posterior tongue. No surrounding erythema. It has an orange discoloration consistent with possible hairy tongue.  Eyes: Conjunctivae and EOM are normal. Pupils are equal, round, and reactive to light.  Neck: Neck supple. No thyromegaly present.  Cardiovascular: Normal rate, regular rhythm and normal heart sounds.   Pulmonary/Chest: Effort normal and breath sounds normal. She has no wheezes.  Musculoskeletal:  Nontender over the thoracic spine. She is tender over the paraspinous midthoracic muscles. And just below the ribs in the abdomen on the left side.  Lymphadenopathy:    She has no cervical adenopathy.  Neurological: She is alert and oriented to person, place, and time.  Skin: Skin is warm and dry.  Psychiatric: She has a normal  mood and affect.          Assessment & Plan:  Mid back pain - suspect most likely related to frequent cough. She's tender on exam which suggest musculoskeletal etiology. Recommend gentle stretching and heating pad.  Persistent dry cough-most likely related to her COPD. She's not been using her Symbicort regularly. We'll discontinue this and switch to [facility] which is once a day. Hopefully she can try to be more consistent with this and we discussed strategies to do so. We will also get a chest x-ray today just to rule out underlying infection. As well as a CBC.  Possible thrush versus hairy tongue-exam was fairly normal except for a little bit of papilla hypertrophy but based on her description we'll go ahead and treat  with nystatin. She is at increased risk for thrush because of her use of inhaled corticosteroids.

## 2014-10-11 ENCOUNTER — Other Ambulatory Visit: Payer: Self-pay | Admitting: Family Medicine

## 2014-10-18 ENCOUNTER — Telehealth: Payer: Self-pay | Admitting: *Deleted

## 2014-10-18 DIAGNOSIS — R053 Chronic cough: Secondary | ICD-10-CM

## 2014-10-18 DIAGNOSIS — J449 Chronic obstructive pulmonary disease, unspecified: Secondary | ICD-10-CM

## 2014-10-18 DIAGNOSIS — R05 Cough: Secondary | ICD-10-CM

## 2014-10-18 NOTE — Telephone Encounter (Signed)
Pt called & requested a pulmonology referral to [place].  Referral placed.

## 2014-10-23 ENCOUNTER — Ambulatory Visit (INDEPENDENT_AMBULATORY_CARE_PROVIDER_SITE_OTHER): Payer: Medicare Other | Admitting: Pulmonary Disease

## 2014-10-23 ENCOUNTER — Encounter: Payer: Self-pay | Admitting: Pulmonary Disease

## 2014-10-23 VITALS — BP 118/76 | HR 74 | Temp 98.4°F | Ht 66.0 in | Wt 161.0 lb

## 2014-10-23 DIAGNOSIS — J387 Other diseases of larynx: Secondary | ICD-10-CM

## 2014-10-23 DIAGNOSIS — J4530 Mild persistent asthma, uncomplicated: Secondary | ICD-10-CM | POA: Diagnosis not present

## 2014-10-23 DIAGNOSIS — J449 Chronic obstructive pulmonary disease, unspecified: Secondary | ICD-10-CM | POA: Insufficient documentation

## 2014-10-23 DIAGNOSIS — F411 Generalized anxiety disorder: Secondary | ICD-10-CM

## 2014-10-23 DIAGNOSIS — K219 Gastro-esophageal reflux disease without esophagitis: Secondary | ICD-10-CM

## 2014-10-23 DIAGNOSIS — K449 Diaphragmatic hernia without obstruction or gangrene: Secondary | ICD-10-CM | POA: Diagnosis not present

## 2014-10-23 MED ORDER — CLONAZEPAM 0.5 MG PO TABS: 0.5000 mg | ORAL_TABLET | Freq: Two times a day (BID) | ORAL | Status: DC

## 2014-10-23 NOTE — Patient Instructions (Signed)
Maxine- it was nice seeing you again...  For your throat & chest symptoms>>    We need to establish a vigorous antireflux regimen:       Take the PRILOSEC 20mg  about 30 min before the evening meal...       Do not eat or drink much after dinner...       Elevate the head of your bed on 6" blocks...     We are also prescribing a "combination relaxer" to help relax those tight throat muscles that make swallowing diffucult at times...       In this regard- start the new KLONOPIN 0.5mg  - one tab twice daily...  Call for any questions...  Let's plan a follow up visit in 4-6weeks, sooner if needed for problems.Marland KitchenMarland Kitchen

## 2014-10-24 ENCOUNTER — Encounter: Payer: Self-pay | Admitting: Pulmonary Disease

## 2014-10-24 NOTE — Progress Notes (Signed)
Subjective:     Patient ID: Beth [name], female   DOB: Mar 01, 1933, 81 y.o.   MRN: [id number]  HPI 81 y/o WF, referred by Dr. Beatrice Lecher [facility] in [place]), for a pulmonary evaluation>  [name] relates a circuitous hx starting ~12/2013 when she felt bad & went to the ER for eval, diagnosed w/ pneumonia (RLL) & was Hosp x 3d;  Records reviewed in EPIC> CAP (nos), COPD (?asthma ?bronchiectasis), HBP, HH, Divertics, HA, anxiety/depression;  Treated w/ Rocephin & Zithromax, then changed to Levaquin at disch also w/ an AlbutHFA rescue inhaler for prn use, Tussionex & Mucinex;  Since then she has noted upper resp symptoms w/ some hoarseness, cough, prob LPR & reflux w/ throat tightness; she notes that she will occas choke on water;  otherw she gives a long hx w/ mult somatic complaints- pain in ribs and back, discomfort worse w/ movement etc, note hx fibromyalgia...       [name] is essentially a never-smoker having smoked some in her teens- from 70 to 9 then quit & never restarted;  She tells me she had pneumonia as a child, then several bouts as an adult (under the care of Dr.Carlton Harris) but no known sequelae;  She was never told to have Asthma, and heard about poss COPD for the 1st time last yr;  She does recall "allergies" but never formally tested...  She denies any occupational exposures and FamHx is NEG for lung diseases...       Current Meds> Breo one puff daily, NEBS w/ Duoneb but not using regularly due to some jitteriness...  EXAM reveals Afeb, VSS, O2sat=94% on RA;  HEENT- neg, mallampati1;  Chest- essentially clear w/o w/r/r;  Heart- RR w/o m/r/g;  Abd- soft, neg;  Ext- neg w/o c/c/e...  2DEcho 11/12 showed mild LVH, norm LVF w/ EF=60%, no regional wall motion abn, no cardiac source of embolism found...  Spirometry 7/14 showed FVC=2.21 (88%), FEV1=1.32 (71%), %1sec=60, mid-flows were reduced at 49% predicted... c/w mod airflow obstruction however her height was recorded as  63"...  EKG 9/15 showed NSR, rate100, PACs, NSSTTWA, NAD...  CT Abd&Pelvis 9/15 showed patchy airsp dis in RLL w/ focal bronchial wall thickening, mod sized sliding HH, plus 33mm left renal cyst/ s/p hyst/ extensive colonic divertics, diffuse DDD...  Spirometry 1/16 showed FVC=2.22 (87%), FEV1=1.71 (91%), %1sec=77, mid-flows were wnl at 110% predicted... This study is WNL.Marland KitchenMarland Kitchen  CXR 4/16 showed borderline cardiomeg. right mid lung scarring, some hyperinflation, kyphosis, NAD...   LABS EPIC review>  CBC- wnl x eos=10%;  Chems- wnl;    IMP/PLAN>>  Jane had some evid of airflow obstruction on Spirometry in 2014 but improved (a reversible asthmatic component) on her more recent study;  She has been on the [facility] & appears improved;  Her current symptoms sound more like LPR, reflux-related w/ throat tightness etc;  We discussed a good antireflux regimen w/ PPI about 30 min before dinner, NPO after dinner, elev HOB on 6" blocks, etc;  In addition I would like to try her on a "combination relaxer" to help her throat tightness AND help her rest better at night- KLONOPIN 0.5mg  bid should be beneficial... I offered to switch her Duoneb to Xopenex in hopes of decreasing any jitteriness for the beta agonist but she prefers to keep these meds the same for now... REC an ROV recheck in [place]...    Past Medical History  Diagnosis Date  . Hypertension >> on ToprolXL 25mg /d...   Marland Kitchen  Hyperlipidemia >> on diet alone...   . Anxiety      Situational >> on Zoloft 50mg /d...  . Insomnia   . Diverticulitis >> on Miralax   . Arthritis   . Fibromyalgia >> on Elavil25 1-2 Qhs...   . Asthma   . COPD (chronic obstructive pulmonary disease)     PER PT    Past Surgical History  Procedure Laterality Date  . Abdominal hysterectomy    . Bladder surgery      tac  . Colonoscopy      2008    Outpatient Encounter Prescriptions as of 10/23/2014  Medication Sig  . albuterol (PROVENTIL HFA;VENTOLIN HFA) 108 (90 BASE)  MCG/ACT inhaler Inhale 2 puffs into the lungs every 6 (six) hours as needed for wheezing or shortness of breath.  Marland Kitchen amitriptyline (ELAVIL) 25 MG tablet Take 1-2 tablets (25-50 mg total) by mouth at bedtime.  . calcium carbonate 1250 MG capsule Take 1,250 mg by mouth daily.   . Cholecalciferol (VITAMIN D3) 1000 UNITS tablet Take 1,000 Units by mouth daily.    . Fluticasone Furoate-Vilanterol (BREO ELLIPTA) 100-25 MCG/INH AEPB Inhale 1 puff into the lungs daily. (Patient taking differently: Inhale 1 puff into the lungs daily as needed. )  . ibuprofen (ADVIL,MOTRIN) 200 MG tablet Take 200 mg by mouth every 6 (six) hours as needed.  Marland Kitchen ipratropium-albuterol (DUONEB) 0.5-2.5 (3) MG/3ML SOLN Take 3 mLs by nebulization every 2 (two) hours as needed.  . metoprolol succinate (TOPROL-XL) 25 MG 24 hr tablet TAKE ONE TABLET BY MOUTH ONCE DAILY  . Multiple Vitamin (MULTIVITAMIN) tablet Take 1 tablet by mouth daily.    . Multiple Vitamins-Minerals (PRESERVISION/LUTEIN) CAPS Take 1 capsule by mouth daily.   Marland Kitchen omeprazole (PRILOSEC) 20 MG capsule Take 1 capsule (20 mg total) by mouth daily.  . polyethylene glycol powder (GLYCOLAX/MIRALAX) powder Take 1 capful (17 g) daily in 1/2 (4 ounces) of liquid.  Skip a day if you have diarrhea.  . Pyridoxine HCl (VITAMIN B-6) 500 MG tablet Take 500 mg by mouth daily.    . sertraline (ZOLOFT) 50 MG tablet TAKE ONE TABLET BY MOUTH ONCE DAILY  . vitamin B-12 (CYANOCOBALAMIN) 1000 MCG tablet Take 1,000 mcg by mouth daily.    . clonazePAM (KLONOPIN) 0.5 MG tablet Take 1 tablet (0.5 mg total) by mouth 2 (two) times daily.  Marland Kitchen nystatin (MYCOSTATIN) 100000 UNIT/ML suspension Take 5 mLs (500,000 Units total) by mouth 4 (four) times daily. (Patient not taking: Reported on 10/23/2014)   No facility-administered encounter medications on file as of 10/23/2014.    Allergies  Allergen Reactions  . Doxycycline Other (See Comments)    Blisters on mouth and lips  . Pravastatin      Fatigued.   . Simvastatin Other (See Comments)    Myalgias and memory loss  . Trazodone And Nefazodone Other (See Comments)    Made her hyper    Immunization History  Administered Date(s) Administered  . Influenza Split 02/18/2012  . Influenza Whole 05/04/2009  . Influenza,inj,Quad PF,36+ Mos 02/25/2013  . Influenza-Unspecified 02/21/2014  . Pneumococcal Polysaccharide-23 07/03/2004  . Tdap 02/18/2011  . Zoster 02/21/2013    Family History  Problem Relation Age of Onset  . Cancer Mother     breast  . Hyperlipidemia Mother   . Hypertension Mother   . Heart disease Father     MI  . Depression Father   . Hyperlipidemia Father   . Dementia Mother     History  Social History  . Marital Status: Widowed    Spouse Name: N/A  . Number of Children: N/A  . Years of Education: N/A   Occupational History  . Not on file.   Social History Main Topics  . Smoking status: Never Smoker   . Smokeless tobacco: Never Used  . Alcohol Use: No  . Drug Use: No  . Sexual Activity: Not on file   Other Topics Concern  . Not on file   Social History Narrative    .Current Medications, Allergies, Past Medical History, Past Surgical History, Family History, and Social History were reviewed in [employer] record.   Review of Systems            All symptoms NEG except where BOLDED >>  Constitutional:  F/C/S, fatigue, anorexia, unexpected weight change. HEENT:  HA, visual changes, hearing loss, earache, nasal symptoms, sore throat, mouth sores, hoarseness. Resp:  cough, sputum, hemoptysis; SOB, tightness, wheezing. Cardio:  CP, palpit, DOE, orthopnea, edema. GI:  N/V/D/C, blood in stool; reflux, abd pain, distention, gas. GU:  dysuria, freq, urgency, hematuria, flank pain, voiding difficulty. MS:  joint pain, swelling, tenderness, decr ROM; neck pain, back pain, etc. Neuro:  HA, tremors, seizures, dizziness, syncope, weakness, numbness, gait abn. Skin:   suspicious lesions or skin rash. Heme:  adenopathy, bruising, bleeding. Psyche:  confusion, agitation, sleep disturbance, hallucinations, anxiety, depression suicidal.   Objective:   Physical Exam      Vital Signs:  Reviewed...  General:  WD, WN, 81 y/o WF in NAD; alert & oriented; pleasant & cooperative... HEENT:  /AT; Conjunctiva- pink, Sclera- nonicteric, EOM-wnl, PERRLA, EACs-clear, TMs-wnl; NOSE-clear; THROAT- sl red w/o lesions. Neck:  Supple w/ fair ROM; no JVD; normal carotid impulses w/o bruits; no thyromegaly or nodules palpated; no lymphadenopathy. Chest:  Clear to P & A; without wheezes, rales, or rhonchi heard. Heart:  Regular Rhythm; norm S1 & S2 without murmurs, rubs, or gallops detected. Abdomen:  Soft & nontender- no guarding or rebound; normal bowel sounds; no organomegaly or masses palpated. Ext:  decrROM; no deformities or arthritic changes; no varicose veins, venous insuffic, or edema;  Pulses intact w/o bruits. Neuro:  No focal neuro deficits; gait normal & balance OK. Derm:  No lesions noted; no rash etc. Lymph:  No cervical, supraclavicular, axillary, or inguinal adenopathy palpated.   Assessment:      IMP >>  Reported mild-mod airflow obstruction w/ an apparent reversible component> she didn't smoke hardly at all & therefore not likely COPD so I favor RADS- this fits w/ her hx of trouble since 9/15 bout of pneumonia;  She is on a good regimen w/ BREO, & Albuterol via rescue inhaler or NEB, offered change to Xopenex if needed but she prefers to use the Duoneb Rx she has a supply at home...  [place] Hernia w/ GERD & LPR> on Prilosec20, and add vigorous antireflux regimen. Anxiety & throat tightness-- try Klonopin 0.5mg  bid.  PLAN >>  Lisaanne had some evid of airflow obstruction on Spirometry in 2014 but improved (a reversible asthmatic component) on her more recent study;  She has been on the [facility] & appears improved;  Her current symptoms sound more like LPR,  reflux-related w/ throat tightness etc;  We discussed a good antireflux regimen w/ PPI about 30 min before dinner, NPO after dinner, elev HOB on 6" blocks, etc;  In addition I would like to try her on a "combination relaxer" to help her throat tightness AND help  her rest better at night- KLONOPIN 0.5mg  bid should be beneficial... I offered to switch her Duoneb to Xopenex in hopes of decreasing any jitteriness for the beta agonist but she prefers to keep these meds the same for now... REC an ROV recheck in [place]:     Patient's Medications  New Prescriptions   CLONAZEPAM (KLONOPIN) 0.5 MG TABLET    Take 1 tablet (0.5 mg total) by mouth 2 (two) times daily.  Previous Medications   ALBUTEROL (PROVENTIL HFA;VENTOLIN HFA) 108 (90 BASE) MCG/ACT INHALER    Inhale 2 puffs into the lungs every 6 (six) hours as needed for wheezing or shortness of breath.   AMITRIPTYLINE (ELAVIL) 25 MG TABLET    Take 1-2 tablets (25-50 mg total) by mouth at bedtime.   CALCIUM CARBONATE 1250 MG CAPSULE    Take 1,250 mg by mouth daily.    CHOLECALCIFEROL (VITAMIN D3) 1000 UNITS TABLET    Take 1,000 Units by mouth daily.     FLUTICASONE FUROATE-VILANTEROL (BREO ELLIPTA) 100-25 MCG/INH AEPB    Inhale 1 puff into the lungs daily.   IBUPROFEN (ADVIL,MOTRIN) 200 MG TABLET    Take 200 mg by mouth every 6 (six) hours as needed.   IPRATROPIUM-ALBUTEROL (DUONEB) 0.5-2.5 (3) MG/3ML SOLN    Take 3 mLs by nebulization every 2 (two) hours as needed.   METOPROLOL SUCCINATE (TOPROL-XL) 25 MG 24 HR TABLET    TAKE ONE TABLET BY MOUTH ONCE DAILY   MULTIPLE VITAMIN (MULTIVITAMIN) TABLET    Take 1 tablet by mouth daily.     MULTIPLE VITAMINS-MINERALS (PRESERVISION/LUTEIN) CAPS    Take 1 capsule by mouth daily.    NYSTATIN (MYCOSTATIN) 100000 UNIT/ML SUSPENSION    Take 5 mLs (500,000 Units total) by mouth 4 (four) times daily.   OMEPRAZOLE (PRILOSEC) 20 MG CAPSULE    Take 1 capsule (20 mg total) by mouth daily.   POLYETHYLENE GLYCOL  POWDER (GLYCOLAX/MIRALAX) POWDER    Take 1 capful (17 g) daily in 1/2 (4 ounces) of liquid.  Skip a day if you have diarrhea.   PYRIDOXINE HCL (VITAMIN B-6) 500 MG TABLET    Take 500 mg by mouth daily.     SERTRALINE (ZOLOFT) 50 MG TABLET    TAKE ONE TABLET BY MOUTH ONCE DAILY   VITAMIN B-12 (CYANOCOBALAMIN) 1000 MCG TABLET    Take 1,000 mcg by mouth daily.    Modified Medications   No medications on file  Discontinued Medications   No medications on file

## 2014-11-13 ENCOUNTER — Ambulatory Visit: Payer: Medicare Other | Admitting: Family Medicine

## 2014-11-14 ENCOUNTER — Other Ambulatory Visit: Payer: Self-pay | Admitting: Family Medicine

## 2014-11-16 ENCOUNTER — Other Ambulatory Visit: Payer: Self-pay | Admitting: Family Medicine

## 2014-11-21 ENCOUNTER — Ambulatory Visit (INDEPENDENT_AMBULATORY_CARE_PROVIDER_SITE_OTHER): Payer: Medicare Other | Admitting: Pulmonary Disease

## 2014-11-21 ENCOUNTER — Encounter: Payer: Self-pay | Admitting: Pulmonary Disease

## 2014-11-21 VITALS — BP 124/76 | HR 79 | Temp 97.6°F | Wt 161.8 lb

## 2014-11-21 DIAGNOSIS — J387 Other diseases of larynx: Secondary | ICD-10-CM

## 2014-11-21 DIAGNOSIS — K449 Diaphragmatic hernia without obstruction or gangrene: Secondary | ICD-10-CM | POA: Diagnosis not present

## 2014-11-21 DIAGNOSIS — J4520 Mild intermittent asthma, uncomplicated: Secondary | ICD-10-CM

## 2014-11-21 DIAGNOSIS — K219 Gastro-esophageal reflux disease without esophagitis: Secondary | ICD-10-CM

## 2014-11-21 DIAGNOSIS — F411 Generalized anxiety disorder: Secondary | ICD-10-CM | POA: Diagnosis not present

## 2014-11-21 MED ORDER — ALBUTEROL SULFATE HFA 108 (90 BASE) MCG/ACT IN AERS: 2.0000 | INHALATION_SPRAY | Freq: Four times a day (QID) | RESPIRATORY_TRACT | Status: DC | PRN

## 2014-11-21 NOTE — Patient Instructions (Signed)
Today we updated your med list in our EPIC system...    Continue your current medications the same...    Continue the Klonopin twice daily...  We refilled your [place] for as needed use...    Try 1/2 of the Xopenex for nebulizer to see if this cuts down on the jitteriness...  Call for any questions...  Let's plan a follow up visit in 65mo, sooner if needed for breathing problems.Marland KitchenMarland Kitchen

## 2014-11-23 ENCOUNTER — Encounter: Payer: Self-pay | Admitting: Pulmonary Disease

## 2014-11-23 NOTE — Progress Notes (Signed)
Subjective:     Patient ID: Beth [name], female   DOB: [birth year], 81 y.o.   MRN: [id number]  HPI ~  October 23, 2014:  Initial consult by SN>        33 y/o WF, referred by Dr. [name] in [place]), for a pulmonary evaluation>  Beth [name] relates a circuitous hx starting ~12/2013 when she felt bad & went to the ER for eval, diagnosed w/ pneumonia (RLL) & was Hosp x 3d;  Records reviewed in EPIC> CAP (nos), COPD (?asthma ?bronchiectasis), HBP, HH, Divertics, HA, anxiety/depression;  Treated w/ Rocephin & Zithromax, then changed to Levaquin at disch also w/ an AlbutHFA rescue inhaler for prn use, Tussionex & Mucinex;  Since then she has noted upper resp symptoms w/ some hoarseness, cough, prob LPR & reflux w/ throat tightness; she notes that she will occas choke on water;  otherw she gives a long hx w/ mult somatic complaints- pain in ribs and back, discomfort worse w/ movement etc, note hx fibromyalgia...       Beth [name] is essentially a never-smoker having smoked some in her teens- from 27 to 70 then quit & never restarted;  She tells me she had pneumonia as a child, then several bouts as an adult (under the care of Dr.Carlton [name]) but no known sequelae;  She was never told to have Asthma, and heard about poss COPD for the 1st time last yr;  She does recall "allergies" but never formally tested...  She denies any occupational exposures and FamHx is NEG for lung diseases...       Current Meds> Breo one puff daily, NEBS w/ Duoneb but not using regularly due to some jitteriness...  EXAM reveals Afeb, VSS, O2sat=94% on RA;  HEENT- neg, mallampati1;  Chest- essentially clear w/o w/r/r;  Heart- RR w/o m/r/g;  Abd- soft, neg;  Ext- neg w/o c/c/e...  2DEcho 11/12 showed mild LVH, norm LVF w/ EF=60%, no regional wall motion abn, no cardiac source of embolism found...  Spirometry 7/14 showed FVC=2.21 (88%), FEV1=1.32 (71%), %1sec=60, mid-flows were reduced at 49% predicted... c/w mod airflow  obstruction.  EKG 9/15 showed NSR, rate100, PACs, NSSTTWA, NAD...  CT Abd&Pelvis 9/15 showed patchy airsp dis in RLL w/ focal bronchial wall thickening, mod sized sliding HH, plus 21mm left renal cyst/ s/p hyst/ extensive colonic divertics, diffuse DDD...  Spirometry 1/16 showed FVC=2.22 (87%), FEV1=1.71 (91%), %1sec=77, mid-flows were wnl at 110% predicted... This study is WNL.Marland KitchenMarland Kitchen  CXR 4/16 showed borderline cardiomeg. right mid lung scarring, some hyperinflation, kyphosis, NAD...   LABS EPIC review>  CBC- wnl x eos=10%;  Chems- wnl;    IMP/PLAN>>  Beth [name] had some evid of airflow obstruction on Spirometry in 2014 but improved (a reversible asthmatic component) on her more recent study;  She has been on the Beth [name] & appears improved;  Her current symptoms sound more like LPR, reflux-related w/ throat tightness etc;  We discussed a good antireflux regimen w/ PPI about 30 min before dinner, NPO after dinner, elev HOB on 6" blocks, etc;  In addition I would like to try her on a "combination relaxer" to help her throat tightness AND help her rest better at night- KLONOPIN 0.5mg  bid should be beneficial... I offered to switch her Duoneb to Xopenex in hopes of decreasing any jitteriness for the beta agonist but she prefers to keep these meds the same for now... REC an ROV recheck in [place]...   ~  November 21, 2014:  87mo  ROV & Beth [name] reports that she is improved on the Klonopin Rx and the antireflux regimen; she specifically notes feeling rested, alert, feeling well now; she still reports some jitteriness after the Nebulizer w/ Duoneb & I again offered to change to Xopenex but she wants to save $$ therefore try using 1/2 of the vial; she requests VentolinHFA refill- she tells me she stopped the Prilosec, Amitrip, and Breo on her own "I don't like it"; we reviewed her CT w/ mod sliding HH and the need for PPI & antireflux regimen (we reviewed this again)...  EXAM reveals Afeb, VSS, O2sat=95% on RA;  HEENT- neg,  mallampati1;  Chest- essentially clear w/o w/r/r;  Heart- RR w/o m/r/g;  Abd- soft, neg;  Ext- neg w/o c/c/e... IMP/PLAN>>  Beth [name]'s symptoms are improved w/ a combination of Klonopin & Antireflux regimen- we reviewed this treatment & directions for continuing these going forward & adjusting the doses; she will call for any breathing problems and plan recheck in 6 months...     Past Medical History  Diagnosis Date  . Hypertension >> on ToprolXL 25mg /d...   . Hyperlipidemia >> on diet alone...   . Anxiety      Situational >> on Zoloft 50mg /d...  . Insomnia    Mod sliding HH on CT scan >> rec to take Prilosec20 before dinner & antireflux regimen   . Diverticulitis >> on Miralax   . Arthritis   . Fibromyalgia >> off Elavil25 now as the Klonopin works better she says   . Asthma >> on VentolinHFA vs NEB w/ Duoneb prn   . COPD (chronic obstructive pulmonary disease) => Spirometry 04/2014 was wnl     Dyspnea >> improved w/ Klonopin 0.5- 1/2 to 1 Bid    Past Surgical History  Procedure Laterality Date  . Abdominal hysterectomy    . Bladder surgery      tac  . Colonoscopy      2008    Outpatient Encounter Prescriptions as of 11/21/2014  Medication Sig  . albuterol (PROVENTIL HFA;VENTOLIN HFA) 108 (90 BASE) MCG/ACT inhaler Inhale 2 puffs into the lungs every 6 (six) hours as needed.  . calcium carbonate 1250 MG capsule Take 1,250 mg by mouth daily.   . Cholecalciferol (VITAMIN D3) 1000 UNITS tablet Take 1,000 Units by mouth daily.    . clonazePAM (KLONOPIN) 0.5 MG tablet Take 1 tablet (0.5 mg total) by mouth 2 (two) times daily.  Marland Kitchen ibuprofen (ADVIL,MOTRIN) 200 MG tablet Take 200 mg by mouth every 6 (six) hours as needed.  Marland Kitchen ipratropium-albuterol (DUONEB) 0.5-2.5 (3) MG/3ML SOLN Take 3 mLs by nebulization every 2 (two) hours as needed.  . metoprolol succinate (TOPROL-XL) 25 MG 24 hr tablet TAKE ONE TABLET BY MOUTH ONCE DAILY  . Multiple Vitamin (MULTIVITAMIN) tablet Take 1 tablet by mouth  daily.    . Multiple Vitamins-Minerals (PRESERVISION/LUTEIN) CAPS Take 1 capsule by mouth daily.   . polyethylene glycol powder (GLYCOLAX/MIRALAX) powder Take 1 capful (17 g) daily in 1/2 (4 ounces) of liquid.  Skip a day if you have diarrhea.  . Pyridoxine HCl (VITAMIN B-6) 500 MG tablet Take 500 mg by mouth daily.    . sertraline (ZOLOFT) 50 MG tablet TAKE ONE TABLET BY MOUTH ONCE DAILY  . vitamin B-12 (CYANOCOBALAMIN) 1000 MCG tablet Take 1,000 mcg by mouth daily.    . [DISCONTINUED] albuterol (PROVENTIL HFA;VENTOLIN HFA) 108 (90 BASE) MCG/ACT inhaler Inhale 2 puffs into the lungs every 6 (six) hours as needed for wheezing or  shortness of breath.  Marland Kitchen amitriptyline (ELAVIL) 25 MG tablet Take 1-2 tablets (25-50 mg total) by mouth at bedtime. (Patient not taking: Reported on 11/21/2014)  . Fluticasone Furoate-Vilanterol (BREO ELLIPTA) 100-25 MCG/INH AEPB Inhale 1 puff into the lungs daily. (Patient not taking: Reported on 11/21/2014)  . nystatin (MYCOSTATIN) 100000 UNIT/ML suspension Take 5 mLs (500,000 Units total) by mouth 4 (four) times daily. (Patient not taking: Reported on 10/23/2014)  . omeprazole (PRILOSEC) 20 MG capsule Take 1 capsule (20 mg total) by mouth daily. (Patient not taking: Reported on 11/21/2014)   No facility-administered encounter medications on file as of 11/21/2014.    Allergies  Allergen Reactions  . Doxycycline Other (See Comments)    Blisters on mouth and lips  . Pravastatin     Fatigued.   . Simvastatin Other (See Comments)    Myalgias and memory loss  . Trazodone And Nefazodone Other (See Comments)    Made her hyper    Immunization History  Administered Date(s) Administered  . Influenza Split 02/18/2012  . Influenza Whole 05/04/2009  . Influenza,inj,Quad PF,36+ Mos 02/25/2013  . Influenza-Unspecified 02/21/2014  . Pneumococcal Polysaccharide-23 07/03/2004  . Tdap 02/18/2011  . Zoster 02/21/2013    Family History  Problem Relation Age of Onset  . Cancer  Mother     breast  . Hyperlipidemia Mother   . Hypertension Mother   . Heart disease Father     MI  . Depression Father   . Hyperlipidemia Father   . Dementia Mother     History   Social History  . Marital Status: Widowed    Spouse Name: N/A  . Number of Children: N/A  . Years of Education: N/A   Occupational History  . Not on file.   Social History Main Topics  . Smoking status: Never Smoker   . Smokeless tobacco: Never Used  . Alcohol Use: No  . Drug Use: No  . Sexual Activity: Not on file   Other Topics Concern  . Not on file   Social History Narrative    .Current Medications, Allergies, Past Medical History, Past Surgical History, Family History, and Social History were reviewed in [employer] record.   Review of Systems            All symptoms NEG except where BOLDED >>  Constitutional:  F/C/S, fatigue, anorexia, unexpected weight change. HEENT:  HA, visual changes, hearing loss, earache, nasal symptoms, sore throat, mouth sores, hoarseness. Resp:  cough, sputum, hemoptysis; SOB, tightness, wheezing. Cardio:  CP, palpit, DOE, orthopnea, edema. GI:  N/V/D/C, blood in stool; reflux, abd pain, distention, gas. GU:  dysuria, freq, urgency, hematuria, flank pain, voiding difficulty. MS:  joint pain, swelling, tenderness, decr ROM; neck pain, back pain, etc. Neuro:  HA, tremors, seizures, dizziness, syncope, weakness, numbness, gait abn. Skin:  suspicious lesions or skin rash. Heme:  adenopathy, bruising, bleeding. Psyche:  confusion, agitation, sleep disturbance, hallucinations, anxiety, depression suicidal.   Objective:   Physical Exam      Vital Signs:  Reviewed...  General:  WD, WN, 81 y/o WF in NAD; alert & oriented; pleasant & cooperative... HEENT:  [place]/AT; Conjunctiva- pink, Sclera- nonicteric, EOM-wnl, PERRLA, EACs-clear, TMs-wnl; NOSE-clear; THROAT- sl red w/o lesions. Neck:  Supple w/ fair ROM; no JVD; normal carotid impulses  w/o bruits; no thyromegaly or nodules palpated; no lymphadenopathy. Chest:  Clear to P & A; without wheezes, rales, or rhonchi heard. Heart:  Regular Rhythm; norm S1 & S2 without murmurs, rubs,  or gallops detected. Abdomen:  Soft & nontender- no guarding or rebound; normal bowel sounds; no organomegaly or masses palpated. Ext:  decrROM; no deformities or arthritic changes; no varicose veins, venous insuffic, or edema;  Pulses intact w/o bruits. Neuro:  No focal neuro deficits; gait normal & balance OK. Derm:  No lesions noted; no rash etc. Lymph:  No cervical, supraclavicular, axillary, or inguinal adenopathy palpated.   Assessment:      IMP >>  Reported mild-mod airflow obstruction w/ an apparent reversible component> she didn't smoke hardly at all & therefore not likely COPD so I favor RADS- this fits w/ her hx of trouble since 9/15 bout of pneumonia;  She is on a good regimen w/ BREO, & Albuterol via rescue inhaler or NEB, offered change to Xopenex if needed but she prefers to use the Duoneb Rx she has a supply at home...  Hiatus Hernia w/ GERD & LPR> on Prilosec20, and add vigorous antireflux regimen. Anxiety & throat tightness-- improved on Klonopin 0.5mg  bid.  PLAN >>  Beth [name]'s symptoms are improved w/ a combination of Klonopin & Antireflux regimen- we reviewed this treatment & directions for continuing these going forward & adjusting the doses; she will call for any breathing problems and plan recheck in 6 months     Plan:     Patient's Medications  New Prescriptions   No medications on file  Previous Medications   AMITRIPTYLINE (ELAVIL) 25 MG TABLET    Take 1-2 tablets (25-50 mg total) by mouth at bedtime.   CALCIUM CARBONATE 1250 MG CAPSULE    Take 1,250 mg by mouth daily.    CHOLECALCIFEROL (VITAMIN D3) 1000 UNITS TABLET    Take 1,000 Units by mouth daily.     CLONAZEPAM (KLONOPIN) 0.5 MG TABLET    Take 1 tablet (0.5 mg total) by mouth 2 (two) times daily.   FLUTICASONE  FUROATE-VILANTEROL (BREO ELLIPTA) 100-25 MCG/INH AEPB    Inhale 1 puff into the lungs daily.   IBUPROFEN (ADVIL,MOTRIN) 200 MG TABLET    Take 200 mg by mouth every 6 (six) hours as needed.   IPRATROPIUM-ALBUTEROL (DUONEB) 0.5-2.5 (3) MG/3ML SOLN    Take 3 mLs by nebulization every 2 (two) hours as needed.   METOPROLOL SUCCINATE (TOPROL-XL) 25 MG 24 HR TABLET    TAKE ONE TABLET BY MOUTH ONCE DAILY   MULTIPLE VITAMIN (MULTIVITAMIN) TABLET    Take 1 tablet by mouth daily.     MULTIPLE VITAMINS-MINERALS (PRESERVISION/LUTEIN) CAPS    Take 1 capsule by mouth daily.    NYSTATIN (MYCOSTATIN) 100000 UNIT/ML SUSPENSION    Take 5 mLs (500,000 Units total) by mouth 4 (four) times daily.   OMEPRAZOLE (PRILOSEC) 20 MG CAPSULE    Take 1 capsule (20 mg total) by mouth daily.   POLYETHYLENE GLYCOL POWDER (GLYCOLAX/MIRALAX) POWDER    Take 1 capful (17 g) daily in 1/2 (4 ounces) of liquid.  Skip a day if you have diarrhea.   PYRIDOXINE HCL (VITAMIN B-6) 500 MG TABLET    Take 500 mg by mouth daily.     SERTRALINE (ZOLOFT) 50 MG TABLET    TAKE ONE TABLET BY MOUTH ONCE DAILY   VITAMIN B-12 (CYANOCOBALAMIN) 1000 MCG TABLET    Take 1,000 mcg by mouth daily.    Modified Medications   Modified Medication Previous Medication   ALBUTEROL (PROVENTIL HFA;VENTOLIN HFA) 108 (90 BASE) MCG/ACT INHALER albuterol (PROVENTIL HFA;VENTOLIN HFA) 108 (90 BASE) MCG/ACT inhaler      Inhale 2 puffs  into the lungs every 6 (six) hours as needed.    Inhale 2 puffs into the lungs every 6 (six) hours as needed for wheezing or shortness of breath.  Discontinued Medications   No medications on file

## 2014-12-14 ENCOUNTER — Telehealth: Payer: Self-pay | Admitting: Internal Medicine

## 2014-12-14 NOTE — Telephone Encounter (Signed)
Pt states that for the past 3 weeks she has had diarrhea and it continues to get worse. States that she eats something and it seems to go right through her. At times she has a hard time making it to the bathroom in time. Pt has taken pepto. She tried Imodium and kaopectate this morning. Pt scheduled to see Alonza Bogus PA tomorrow at 3pm. Pt aware of appt.

## 2014-12-15 ENCOUNTER — Ambulatory Visit (INDEPENDENT_AMBULATORY_CARE_PROVIDER_SITE_OTHER): Payer: Medicare Other | Admitting: Gastroenterology

## 2014-12-15 ENCOUNTER — Other Ambulatory Visit: Payer: Medicare Other

## 2014-12-15 ENCOUNTER — Encounter: Payer: Self-pay | Admitting: Gastroenterology

## 2014-12-15 VITALS — BP 122/72 | HR 68 | Ht 63.0 in | Wt 163.1 lb

## 2014-12-15 DIAGNOSIS — R197 Diarrhea, unspecified: Secondary | ICD-10-CM | POA: Insufficient documentation

## 2014-12-15 NOTE — Progress Notes (Addendum)
     12/15/2014 Beth [name] [id number] February 06, 1933   History of Present Illness:  This is a pleasant 81 year old female who is known to Dr. Hilarie Fredrickson for a new patient visit to establish GI care in 05/2012.  She had full colonoscopy with a good preparation and 2008 by Dr. Cristina Gong that was normal except for diverticulosis.  She presents to our office today with complaints of diarrhea that began 3 weeks ago.  She says that normal her stools alternate between mild constipation and some loose stools.  She admits that she has poor muscle done in her pelvis so sometimes has seepage of small amounts of stool.  It seems that she has loose stools just after passing a hard, more constipated stool.  Did well with Citrucel in the past, but has not been using that recently.  She does report that she occasionally has to press on her perineum to help pass stool when it is hard . For the past 3 weeks, however, she has been having several episodes of diarrhea per day, after every time she would eat.  Diarrhea would hit suddenly with urgency and she had several episodes of incontinence.  She is afraid to go out anywhere now, especially to eat with friends, etc.  She denies any abdominal pain or bleeding.  She was taking a lot of Pepto-Bismol, which did not help.  She took Imodium yesterday and now hs not moved her bowels again since taking that.     Current Medications, Allergies, Past Medical History, Past Surgical History, Family History and Social History were reviewed in [employer] record.   Physical Exam: BP 122/72 mmHg  Pulse 68  Ht 5\' 3"  (1.6 m)  Wt 163 lb 2 oz (73.993 kg)  BMI 28.90 kg/m2 General: Well developed white female in no acute distress Head: Normocephalic and atraumatic Eyes:  Sclerae anicteric, conjunctiva pink  Ears: Normal auditory acuity Lungs: Clear throughout to auscultation Heart: Regular rate and rhythm Abdomen: Soft, non-distended. No masses, no hepatomegaly.  Normal bowel sounds.  Non-tender. Musculoskeletal: Symmetrical with no gross deformities  Extremities: No edema  Neurological: Alert oriented x 4, grossly non-focal Psychological:  Alert and cooperative. Normal mood and affect  Assessment and Recommendations: -Diarrhea with incontinence:  Acute/subacute, began 3 weeks ago.  Took Imodium yesterday and now no further issue so far.  If diarrhea returns then will check stool for Cdiff, culture for enteric pathogens, and O&P.  Addendum: Reviewed and agree with initial management. Jerene Bears, MD

## 2014-12-15 NOTE — Patient Instructions (Signed)
Your physician has requested that you go to the basement for  lab work before leaving today.   

## 2014-12-20 ENCOUNTER — Other Ambulatory Visit: Payer: Medicare Other

## 2014-12-20 DIAGNOSIS — R197 Diarrhea, unspecified: Secondary | ICD-10-CM

## 2014-12-21 LAB — OVA AND PARASITE EXAMINATION: OP: NONE SEEN

## 2014-12-21 LAB — CLOSTRIDIUM DIFFICILE BY PCR: CDIFFPCR: NOT DETECTED

## 2014-12-24 LAB — STOOL CULTURE

## 2014-12-28 ENCOUNTER — Ambulatory Visit (INDEPENDENT_AMBULATORY_CARE_PROVIDER_SITE_OTHER): Payer: Medicare Other | Admitting: Family Medicine

## 2014-12-28 ENCOUNTER — Encounter: Payer: Self-pay | Admitting: Family Medicine

## 2014-12-28 VITALS — BP 158/88 | HR 77 | Wt 164.0 lb

## 2014-12-28 DIAGNOSIS — J449 Chronic obstructive pulmonary disease, unspecified: Secondary | ICD-10-CM | POA: Diagnosis not present

## 2014-12-28 MED ORDER — CEFDINIR 300 MG PO CAPS: 300.0000 mg | ORAL_CAPSULE | Freq: Two times a day (BID) | ORAL | Status: AC

## 2014-12-28 MED ORDER — METHYLPREDNISOLONE ACETATE 80 MG/ML IJ SUSP
80.0000 mg | Freq: Once | INTRAMUSCULAR | Status: AC
  Administered 2014-12-28: 80 mg via INTRAMUSCULAR

## 2014-12-28 NOTE — Addendum Note (Signed)
Addended by: Isaias Cowman C on: 12/28/2014 10:06 AM   Modules accepted: Orders

## 2014-12-28 NOTE — Progress Notes (Signed)
CC: Beth [name] is a 81 y.o. female is here for cough and congestion   Subjective: HPI:  Nonproductive cough, wheezing, moderate in severity and worsened from her baseline. Slightly improved with DuoNeb treatments at home, she is only able to do this once a day to 2 causing anxiety in the evenings. Accompanied by sore throat, facial pressure in the cheeks, subjective fevers and chills. No benefit from over-the-counter cough and cold medication. She's had some mild subjective shortness of breath. She denies chest pain, headache, confusion, rashes nor new joint pain.   Review Of Systems Outlined In HPI  Past Medical History  Diagnosis Date  . Hypertension   . Hyperlipidemia   . Anxiety     situational  . Insomnia   . Diverticulitis   . Arthritis   . Fibromyalgia   . Asthma   . COPD (chronic obstructive pulmonary disease)     PER PT    Past Surgical History  Procedure Laterality Date  . Abdominal hysterectomy    . Bladder surgery      tac  . Colonoscopy      2008   Family History  Problem Relation Age of Onset  . Cancer Mother     breast  . Hyperlipidemia Mother   . Hypertension Mother   . Heart disease Father     MI  . Depression Father   . Hyperlipidemia Father   . Dementia Mother     Social History   Social History  . Marital Status: Widowed    Spouse Name: N/A  . Number of Children: N/A  . Years of Education: N/A   Occupational History  . Not on file.   Social History Main Topics  . Smoking status: Never Smoker   . Smokeless tobacco: Never Used  . Alcohol Use: No  . Drug Use: No  . Sexual Activity: Not on file   Other Topics Concern  . Not on file   Social History Narrative     Objective: BP 158/88 mmHg  Pulse 77  Wt 164 lb (74.39 kg)  SpO2 97%  General: Alert and Oriented, No Acute Distress HEENT: Pupils equal, round, reactive to light. Conjunctivae clear.  External ears unremarkable, canals clear with intact TMs with appropriate  landmarks.  Right Middle ear appears open without effusion left middle ear with a mild serous effusion. Pink inferior turbinates.  Moist mucous membranes, pharynx without inflammation nor lesions.  Neck supple without palpable lymphadenopathy nor abnormal masses. Lungs: Clear to auscultation bilaterally, no wheezing/ronchi/rales.  Comfortable work of breathing. Good air movement. Frequent coughing Cardiac: Regular rate and rhythm. Normal S1/S2.  No murmurs, rubs, nor gallops.   Mental Status: No depression, anxiety, nor agitation. Skin: Warm and dry.  Assessment & Plan: Beth [name] was seen today for cough and congestion.  Diagnoses and all orders for this visit:  Chronic obstructive pulmonary disease, unspecified COPD, unspecified chronic bronchitis type -     cefdinir (OMNICEF) 300 MG capsule; Take 1 capsule (300 mg total) by mouth 2 (two) times daily.   Mild COPD exacerbation therefore start Ceftin ear and she will receive a shot of Depo-Medrol, 80 mg. Signs and symptoms requring emergent/urgent reevaluation were discussed with the patient.   Return if symptoms worsen or fail to improve.

## 2015-01-25 ENCOUNTER — Other Ambulatory Visit: Payer: Self-pay | Admitting: Family Medicine

## 2015-02-16 ENCOUNTER — Other Ambulatory Visit: Payer: Self-pay | Admitting: Family Medicine

## 2015-02-20 ENCOUNTER — Encounter: Payer: Self-pay | Admitting: Family Medicine

## 2015-02-20 ENCOUNTER — Ambulatory Visit (INDEPENDENT_AMBULATORY_CARE_PROVIDER_SITE_OTHER): Payer: Medicare Other | Admitting: Family Medicine

## 2015-02-20 VITALS — BP 131/63 | HR 73 | Temp 97.9°F | Resp 18 | Wt 159.9 lb

## 2015-02-20 DIAGNOSIS — F411 Generalized anxiety disorder: Secondary | ICD-10-CM

## 2015-02-20 DIAGNOSIS — R252 Cramp and spasm: Secondary | ICD-10-CM | POA: Diagnosis not present

## 2015-02-20 DIAGNOSIS — I10 Essential (primary) hypertension: Secondary | ICD-10-CM

## 2015-02-20 DIAGNOSIS — J4520 Mild intermittent asthma, uncomplicated: Secondary | ICD-10-CM

## 2015-02-20 NOTE — Progress Notes (Signed)
   Subjective:    Patient ID: Beth [name], female    DOB: December 25, 1932, 81 y.o.   MRN: [id number]  HPI F/U reactive airway disease - she was seen about 6 or 7 weeks ago for a COPD exacerbation by one of my partners. She was treated with Omnicef and received a injection of Depo-Medrol in the office. She does f/u with Pulmonology.   F/U GAD - she is on sertraline 50 mg daily.  She has been lonely lately.  She says her sleep is better.    She has been having more cramps in her feet lately. This is been a problem most of her adult life but has been sitting on her computer a lot more for exam. The time and thinks that that could be triggering it. She does take a potassium supplement and says she's been consistent with it.  Hypertension- Pt denies chest pain, SOB, dizziness, or heart palpitations.  Taking meds as directed w/o problems.  Stopped her metoprolol because she felt like it was making her feel poorly. She wasn't able to specify exactly what she meant by that but says she feels better off of the medication.    Review of Systems     Objective:   Physical Exam  Constitutional: She is oriented to person, place, and time. She appears well-developed and well-nourished.  HENT:  Head: Normocephalic and atraumatic.  Cardiovascular: Normal rate, regular rhythm and normal heart sounds.   Pulmonary/Chest: Effort normal and breath sounds normal.  Musculoskeletal: She exhibits no edema.  Neurological: She is alert and oriented to person, place, and time.  Skin: Skin is warm and dry.  Psychiatric: She has a normal mood and affect. Her behavior is normal.          Assessment & Plan:  Reactive airways disease-she is doing great. She is off of her medications and really doesn't want to take anything daily. Encouraged to discontinue the albuterol as needed. Encouraged her to take a couple hours before bedtime if she's needing inserts not keeping her awake at night which has been one of the major  concerns that she's had with the albuterol. I did encourage her to call her office if she's using her albuterol frequently.  Generalized anxiety disorder- GAD - 7 score of 5.  We'll continue with sertraline. She's doing very well on it and feels like it's controlling her symptoms well. She rates her symptoms as not difficult at all.  Foot cramps- may just be from prolonged sitting. Encouraged her to get up every 20 minutes and walk and stretch. This we better for her neck and shoulders as well. We will check her potassium as it's been a while since we take a look at that as well as check her TSH.  Hypertension-well-controlled. We'll remove the Toprol from her medication list. She feels better off of it.

## 2015-02-21 LAB — COMPLETE METABOLIC PANEL WITH GFR
ALT: 14 U/L (ref 6–29)
AST: 21 U/L (ref 10–35)
Albumin: 4 g/dL (ref 3.6–5.1)
Alkaline Phosphatase: 75 U/L (ref 33–130)
BUN: 15 mg/dL (ref 7–25)
CHLORIDE: 98 mmol/L (ref 98–110)
CO2: 28 mmol/L (ref 20–31)
CREATININE: 0.69 mg/dL (ref 0.60–0.88)
Calcium: 9.6 mg/dL (ref 8.6–10.4)
GFR, Est African American: >89 mL/min (ref >=60)
GFR, Est Non African American: 82 mL/min (ref >=60)
Glucose, Bld: 88 mg/dL (ref 65–99)
POTASSIUM: 4.4 mmol/L (ref 3.5–5.3)
SODIUM: 136 mmol/L (ref 135–146)
Total Bilirubin: 0.5 mg/dL (ref 0.2–1.2)
Total Protein: 7 g/dL (ref 6.1–8.1)

## 2015-02-21 LAB — LIPID PANEL
CHOL/HDL RATIO: 4.6 ratio (ref <=5.0)
CHOLESTEROL: 222 mg/dL — AB (ref 125–200)
HDL: 48 mg/dL (ref >=46)
LDL CALC: 149 mg/dL — AB (ref <130)
Triglycerides: 127 mg/dL (ref <150)
VLDL: 25 mg/dL (ref <30)

## 2015-02-21 LAB — TSH: TSH: 1.742 u[IU]/mL (ref 0.350–4.500)

## 2015-02-21 LAB — CK: Total CK: 79 U/L (ref 7–177)

## 2015-03-02 ENCOUNTER — Other Ambulatory Visit: Payer: Self-pay | Admitting: Family Medicine

## 2015-03-21 ENCOUNTER — Other Ambulatory Visit: Payer: Self-pay | Admitting: Family Medicine

## 2015-03-26 ENCOUNTER — Ambulatory Visit (INDEPENDENT_AMBULATORY_CARE_PROVIDER_SITE_OTHER): Payer: Medicare Other | Admitting: Osteopathic Medicine

## 2015-03-26 ENCOUNTER — Encounter: Payer: Self-pay | Admitting: Osteopathic Medicine

## 2015-03-26 VITALS — BP 138/66 | HR 68 | Ht 66.0 in | Wt 165.0 lb

## 2015-03-26 DIAGNOSIS — R350 Frequency of micturition: Secondary | ICD-10-CM | POA: Diagnosis not present

## 2015-03-26 DIAGNOSIS — N3090 Cystitis, unspecified without hematuria: Secondary | ICD-10-CM | POA: Diagnosis not present

## 2015-03-26 LAB — POCT URINALYSIS DIPSTICK
BILIRUBIN UA: NEGATIVE
GLUCOSE UA: NEGATIVE
Ketones, UA: NEGATIVE
NITRITE UA: POSITIVE
Protein, UA: NEGATIVE
SPEC GRAV UA: 1.015
UROBILINOGEN UA: 0.2
pH, UA: 5.5

## 2015-03-26 MED ORDER — SULFAMETHOXAZOLE-TRIMETHOPRIM 800-160 MG PO TABS: 1.0000 | ORAL_TABLET | Freq: Two times a day (BID) | ORAL | Status: DC

## 2015-03-26 NOTE — Progress Notes (Signed)
Chief Complaint: Possible UTI  History of Present Illness: Beth [name] is a 81 y.o. female who presents to [facility]  today with concerns for acute urinary tract infection  Onset: 10 days or so Quality: Burning/Urgency Exacerbating factors:   Frequency: yes  Hematuria: no  Odor: yes  Fever/chills: no  Incontinence: nomrlaly mild incontinence but worse since TI symptoms began  Flank Pain: no Previous UTI: >1 year ago Recurrent UTI (3 times/more annually): no Abx in past 3 months: on omnicef form Dr Ileene Rubens 12/28/14, didn't help so she stopped mid-course   Complicated (any of the following): ?Diabetes ?Pregnancy ?Symptoms for seven or more days before seeking care - YES ?Hospital acquired infection ?Renal failure ?Urinary tract obstruction ?Presence of an indwelling urethral catheter, stent, nephrostomy tube or urinary diversion ?Functional or anatomic abnormality of the urinary tract ?Renal transplantation ?Immunosuppression   Past medical, social and family history reviewed: Past Medical History  Diagnosis Date  . Hypertension   . Hyperlipidemia   . Anxiety     situational  . Insomnia   . Diverticulitis   . Arthritis   . Fibromyalgia   . Asthma   . COPD (chronic obstructive pulmonary disease) (HCC)     PER PT   Past Surgical History  Procedure Laterality Date  . Abdominal hysterectomy    . Bladder surgery      tac  . Colonoscopy      2008   Social History  Substance Use Topics  . Smoking status: Never Smoker   . Smokeless tobacco: Never Used  . Alcohol Use: No   The patient has a family history of  Current Outpatient Prescriptions  Medication Sig Dispense Refill  . albuterol (PROVENTIL HFA;VENTOLIN HFA) 108 (90 BASE) MCG/ACT inhaler Inhale 2 puffs into the lungs every 6 (six) hours as needed. 1 Inhaler 2  . calcium carbonate 1250 MG capsule Take 1,250 mg by mouth daily.     . Cholecalciferol (VITAMIN D3) 1000 UNITS  tablet Take 1,000 Units by mouth daily.      . clonazePAM (KLONOPIN) 0.5 MG tablet Take 1 tablet (0.5 mg total) by mouth 2 (two) times daily. 60 tablet 5  . ibuprofen (ADVIL,MOTRIN) 200 MG tablet Take 200 mg by mouth every 6 (six) hours as needed.    . metoprolol succinate (TOPROL-XL) 25 MG 24 hr tablet TAKE ONE TABLET BY MOUTH ONCE DAILY 30 tablet 2  . Multiple Vitamin (MULTIVITAMIN) tablet Take 1 tablet by mouth daily.      . Multiple Vitamins-Minerals (PRESERVISION/LUTEIN) CAPS Take 1 capsule by mouth daily.     Marland Kitchen omeprazole (PRILOSEC) 20 MG capsule Take 1 capsule (20 mg total) by mouth daily. (Patient taking differently: Take 20 mg by mouth as needed. ) 30 capsule 6  . polyethylene glycol powder (GLYCOLAX/MIRALAX) powder Take 1 capful (17 g) daily in 1/2 (4 ounces) of liquid.  Skip a day if you have diarrhea. 255 g 6  . Pyridoxine HCl (VITAMIN B-6) 500 MG tablet Take 500 mg by mouth daily.      . sertraline (ZOLOFT) 50 MG tablet TAKE ONE TABLET BY MOUTH ONCE DAILY 30 tablet 0  . vitamin B-12 (CYANOCOBALAMIN) 1000 MCG tablet Take 1,000 mcg by mouth daily.      . [DISCONTINUED] fexofenadine (ALLEGRA) 180 MG tablet Take 180 mg by mouth daily.       No current facility-administered medications for this visit.   Allergies  Allergen Reactions  . Doxycycline Other (  See Comments)    Blisters on mouth and lips  . Pravachol [Pravastatin]     Fatigued.   . Trazodone And Nefazodone Other (See Comments)    Made her hyper  . Zocor [Simvastatin] Other (See Comments)    Myalgias and memory loss     Review of Systems: CONSTITUTIONAL: Negative fever/chills CARDIAC: No chest pain/pressure/palpitations, no orthopnea RESPIRATORY: No cough/shortness of breath/wheeze GASTROINTESTINAL: No nausea/vomiting/abdominal pain/blood in stool/diarrhea/constipation MUSCULOSKELETAL: No myalgia/arthralgia/back pain GENITOURINARY: Positive chronic but worse incontinence, Negative abnormal genital  bleeding/discharge. (+)Dysuria/UTI symptoms as per HPI   Exam:  Filed Vitals:   03/26/15 1532  Height: 5\' 6"  (1.676 m)  Weight: 165 lb (74.844 kg)   Filed Vitals:   03/26/15 1532  BP: 138/66  Pulse: 68   Constitutional: VSS, see above. General Appearance: alert, well-developed, well-nourished, NAD Respiratory: Normal respiratory effort. Breath sounds normal, no wheeze/rhonchi/rales Cardiovascular: S1/S2 normal, no murmur/rub/gallop auscultated. RRR Gastrointestinal: Nontender, no masses. No hepatomegaly, no splenomegaly. No hernia appreciated. Rectal exam deferred.  Musculoskeletal: Gait normal. No clubbing/cyanosis of digits. Lloyd sign Negative bilateral   Results for orders placed or performed in visit on 03/26/15 (from the past 72 hour(s))  POCT Urinalysis Dipstick     Status: Abnormal   Collection Time: 03/26/15  3:39 PM  Result Value Ref Range   Color, UA YELLOW    Clarity, UA CLEAR    Glucose, UA NEG    Bilirubin, UA NEG    Ketones, UA NEG    Spec Grav, UA 1.015    Blood, UA TRACE    pH, UA 5.5    Protein, UA NEG    Urobilinogen, UA 0.2    Nitrite, UA POSITIVE    Leukocytes, UA moderate (2+) (A) Negative      ASSESSMENT/PLAN: We'll do a bit longer course of Bactrim due to patient's prolonged symptoms prior to seeking care, patient advised we will call if needs change antibiotics based on urine culture results. Patient advised to call us if she experiences any concerning side effects with the medication.   Cystitis - Plan: Urine Culture, sulfamethoxazole-trimethoprim (BACTRIM DS) 800-160 MG tablet  Urine frequency - Plan: POCT Urinalysis Dipstick

## 2015-03-26 NOTE — Patient Instructions (Signed)
Take all antibiotics even if you are feeling better. We will call you if we need to change the antibiotics based on urine culture results. Any problems or concerns, please call the office. Otherwise follow-up as directed with Dr. Suzi Roots for routine care

## 2015-03-29 LAB — URINE CULTURE: Colony Count: >=100000

## 2015-04-05 ENCOUNTER — Ambulatory Visit: Payer: Medicare Other

## 2015-04-06 ENCOUNTER — Encounter: Payer: Self-pay | Admitting: Family Medicine

## 2015-04-06 ENCOUNTER — Ambulatory Visit (INDEPENDENT_AMBULATORY_CARE_PROVIDER_SITE_OTHER): Payer: Medicare Other | Admitting: Family Medicine

## 2015-04-06 VITALS — BP 132/62 | HR 86 | Temp 97.3°F | Wt 164.0 lb

## 2015-04-06 DIAGNOSIS — R300 Dysuria: Secondary | ICD-10-CM

## 2015-04-06 DIAGNOSIS — R3915 Urgency of urination: Secondary | ICD-10-CM

## 2015-04-06 MED ORDER — CEFDINIR 300 MG PO CAPS: 300.0000 mg | ORAL_CAPSULE | Freq: Two times a day (BID) | ORAL | Status: DC

## 2015-04-06 NOTE — Progress Notes (Signed)
Beth [name] is a 81 y.o. female who presents to [facility]: [facility] today for dysuria. Patient was seen 11 days ago for a UTI. Culture was done which showed Escherichia coli sensitive to Bactrim surgeon or to sports and Cipro and amoxicillin. She was treated with Bactrim and felt a lot better. She continues to be mildly symptomatic with urinary frequency and urgency. She denies any fevers chills nausea vomiting or diarrhea.   Past Medical History  Diagnosis Date  . Hypertension   . Hyperlipidemia   . Anxiety     situational  . Insomnia   . Diverticulitis   . Arthritis   . Fibromyalgia   . Asthma   . COPD (chronic obstructive pulmonary disease) (HCC)     PER PT   Past Surgical History  Procedure Laterality Date  . Abdominal hysterectomy    . Bladder surgery      tac  . Colonoscopy      2008   Social History  Substance Use Topics  . Smoking status: Never Smoker   . Smokeless tobacco: Never Used  . Alcohol Use: No   family history includes Cancer in her mother; Dementia in her mother; Depression in her father; Heart disease in her father; Hyperlipidemia in her father and mother; Hypertension in her mother.  ROS as above Medications: Current Outpatient Prescriptions  Medication Sig Dispense Refill  . albuterol (PROVENTIL HFA;VENTOLIN HFA) 108 (90 BASE) MCG/ACT inhaler Inhale 2 puffs into the lungs every 6 (six) hours as needed. 1 Inhaler 2  . calcium carbonate 1250 MG capsule Take 1,250 mg by mouth daily.     . Cholecalciferol (VITAMIN D3) 1000 UNITS tablet Take 1,000 Units by mouth daily.      . clonazePAM (KLONOPIN) 0.5 MG tablet Take 1 tablet (0.5 mg total) by mouth 2 (two) times daily. 60 tablet 5  . ibuprofen (ADVIL,MOTRIN) 200 MG tablet Take 200 mg by mouth every 6 (six) hours as needed.    . metoprolol succinate (TOPROL-XL) 25 MG 24 hr tablet TAKE ONE TABLET BY MOUTH ONCE DAILY 30 tablet 2  . Multiple Vitamin (MULTIVITAMIN) tablet Take 1  tablet by mouth daily.      . Multiple Vitamins-Minerals (PRESERVISION/LUTEIN) CAPS Take 1 capsule by mouth daily.     Marland Kitchen omeprazole (PRILOSEC) 20 MG capsule Take 1 capsule (20 mg total) by mouth daily. (Patient taking differently: Take 20 mg by mouth as needed. ) 30 capsule 6  . polyethylene glycol powder (GLYCOLAX/MIRALAX) powder Take 1 capful (17 g) daily in 1/2 (4 ounces) of liquid.  Skip a day if you have diarrhea. 255 g 6  . Pyridoxine HCl (VITAMIN B-6) 500 MG tablet Take 500 mg by mouth daily.      . sertraline (ZOLOFT) 50 MG tablet TAKE ONE TABLET BY MOUTH ONCE DAILY 30 tablet 0  . vitamin B-12 (CYANOCOBALAMIN) 1000 MCG tablet Take 1,000 mcg by mouth daily.      . cefdinir (OMNICEF) 300 MG capsule Take 1 capsule (300 mg total) by mouth 2 (two) times daily. 14 capsule 0  . [DISCONTINUED] fexofenadine (ALLEGRA) 180 MG tablet Take 180 mg by mouth daily.       No current facility-administered medications for this visit.   Allergies  Allergen Reactions  . Doxycycline Other (See Comments)    Blisters on mouth and lips  . Pravachol [Pravastatin]     Fatigued.   . Trazodone And Nefazodone Other (See Comments)    Made her  hyper  . Zocor [Simvastatin] Other (See Comments)    Myalgias and memory loss     Exam:  BP 132/62 mmHg  Pulse 86  Temp(Src) 97.3 F (36.3 C) (Oral)  Wt 164 lb (74.39 kg)  SpO2 97% Gen: Well NAD HEENT: EOMI,  MMM Lungs: Normal work of breathing. CTABL Heart: RRR no MRG Abd: NABS, Soft. Nondistended, Nontender  Exts: Brisk capillary refill, warm and well perfused.    Urine culture results reviewed.   No results found for this or any previous visit (from the past 24 hour(s)). No results found.   Please see individual assessment and plan sections.

## 2015-04-06 NOTE — Patient Instructions (Signed)
Thank you for coming in today. If your belly pain worsens, or you have high fever, bad vomiting, blood in your stool or black tarry stool go to the Emergency Room.  Take omnicef twice daily for 1 week.  Return as needed.

## 2015-04-06 NOTE — Assessment & Plan Note (Signed)
Possible UTI. Culture pending. Empiric treatment with Omnicef.

## 2015-04-07 ENCOUNTER — Other Ambulatory Visit: Payer: Self-pay | Admitting: Family Medicine

## 2015-04-08 LAB — URINE CULTURE
Colony Count: NO GROWTH
ORGANISM ID, BACTERIA: NO GROWTH

## 2015-04-25 ENCOUNTER — Other Ambulatory Visit: Payer: Self-pay | Admitting: Pulmonary Disease

## 2015-05-15 ENCOUNTER — Other Ambulatory Visit: Payer: Self-pay | Admitting: Family Medicine

## 2015-05-29 ENCOUNTER — Encounter: Payer: Self-pay | Admitting: Pulmonary Disease

## 2015-05-29 ENCOUNTER — Ambulatory Visit (INDEPENDENT_AMBULATORY_CARE_PROVIDER_SITE_OTHER): Payer: Medicare Other | Admitting: Pulmonary Disease

## 2015-05-29 VITALS — BP 138/80 | HR 67 | Temp 96.9°F | Ht 66.0 in | Wt 168.2 lb

## 2015-05-29 DIAGNOSIS — Z87440 Personal history of urinary (tract) infections: Secondary | ICD-10-CM | POA: Diagnosis not present

## 2015-05-29 DIAGNOSIS — F411 Generalized anxiety disorder: Secondary | ICD-10-CM

## 2015-05-29 DIAGNOSIS — J387 Other diseases of larynx: Secondary | ICD-10-CM | POA: Diagnosis not present

## 2015-05-29 DIAGNOSIS — Z23 Encounter for immunization: Secondary | ICD-10-CM

## 2015-05-29 DIAGNOSIS — K449 Diaphragmatic hernia without obstruction or gangrene: Secondary | ICD-10-CM

## 2015-05-29 DIAGNOSIS — J4520 Mild intermittent asthma, uncomplicated: Secondary | ICD-10-CM | POA: Diagnosis not present

## 2015-05-29 DIAGNOSIS — K219 Gastro-esophageal reflux disease without esophagitis: Secondary | ICD-10-CM

## 2015-05-29 NOTE — Patient Instructions (Signed)
Today we updated your med list in our EPIC system...    Continue your current medications the same...  Continue the Proventil inhaler & the Klonopin a prescribed...  You need to commit to an antireflux regimen>    Take the Prilosec 20mg  about 30 min before dinner in the evening...    Do not eat or drink after the dinner meal...    Elevate the head of your bed on 6" blocks...  Increase your regular exercise program...  We will arrange for a follow up visit w/ Urology- DrDahlstedt due to your recurrent urinary infections...  Call for any questions...  Let's plan a follow up visit in 73mo, sooner if needed for breathing problems.Marland KitchenMarland Kitchen

## 2015-05-29 NOTE — Progress Notes (Signed)
Subjective:     Patient ID: Beth [name], female   DOB: 01-Nov-1932, 82 y.o.   MRN: [id number]  HPI ~  October 23, 2014:  Initial consult by SN>        51 y/o WF, referred by Dr. Beatrice Lecher [facility] in [place]), for a pulmonary evaluation>  Dottye relates a circuitous hx starting ~12/2013 when she felt bad & went to the ER for eval, diagnosed w/ pneumonia (RLL) & was Hosp x 3d;  Records reviewed in EPIC> CAP (nos), COPD (?asthma ?bronchiectasis), HBP, HH, Divertics, HA, anxiety/depression;  Treated w/ Rocephin & Zithromax, then changed to Levaquin at disch also w/ an AlbutHFA rescue inhaler for prn use, Tussionex & Mucinex;  Since then she has noted upper resp symptoms w/ some hoarseness, cough, prob LPR & reflux w/ throat tightness; she notes that she will occas choke on water;  otherw she gives a long hx w/ mult somatic complaints- pain in ribs and back, discomfort worse w/ movement etc, note hx fibromyalgia...       Chenda is essentially a never-smoker having smoked some in her teens- from 38 to 47 then quit & never restarted;  She tells me she had pneumonia as a child, then several bouts as an adult (under the care of Dr.Carlton Harris) but no known sequelae;  She was never told to have Asthma, and heard about poss COPD for the 1st time last yr;  She does recall "allergies" but never formally tested...  She denies any occupational exposures and FamHx is NEG for lung diseases...       Current Meds> Breo one puff daily, NEBS w/ Duoneb but not using regularly due to some jitteriness...  EXAM reveals Afeb, VSS, O2sat=94% on RA;  HEENT- neg, mallampati1;  Chest- essentially clear w/o w/r/r;  Heart- RR w/o m/r/g;  Abd- soft, neg;  Ext- neg w/o c/c/e...  2DEcho 11/12 showed mild LVH, norm LVF w/ EF=60%, no regional wall motion abn, no cardiac source of embolism found...  Spirometry 7/14 showed FVC=2.21 (88%), FEV1=1.32 (71%), %1sec=60, mid-flows were reduced at 49% predicted... c/w mod airflow  obstruction.  EKG 9/15 showed NSR, rate100, PACs, NSSTTWA, NAD...  CT Abd&Pelvis 9/15 showed patchy airsp dis in RLL w/ focal bronchial wall thickening, mod sized sliding HH, plus 62mm left renal cyst/ s/p hyst/ extensive colonic divertics, diffuse DDD...  Spirometry 1/16 showed FVC=2.22 (87%), FEV1=1.71 (91%), %1sec=77, mid-flows were wnl at 110% predicted... This study is WNL.Marland KitchenMarland Kitchen  CXR 4/16 showed borderline cardiomeg. right mid lung scarring, some hyperinflation, kyphosis, NAD...   LABS EPIC review>  CBC- wnl x eos=10%;  Chems- wnl;    IMP/PLAN>>  [name] had some evid of airflow obstruction on Spirometry in 2014 but improved (a reversible asthmatic component) on her more recent study;  She has been on the [facility] & appears improved;  Her current symptoms sound more like LPR, reflux-related w/ throat tightness etc;  We discussed a good antireflux regimen w/ PPI about 30 min before dinner, NPO after dinner, elev HOB on 6" blocks, etc;  In addition I would like to try her on a "combination relaxer" to help her throat tightness AND help her rest better at night- KLONOPIN 0.5mg  bid should be beneficial... I offered to switch her Duoneb to Xopenex in hopes of decreasing any jitteriness for the beta agonist but she prefers to keep these meds the same for now... REC an ROV recheck in [name]...   ~  November 21, 2014:  13mo  ROV & Xochilth reports that she is improved on the Klonopin Rx and the antireflux regimen; she specifically notes feeling rested, alert, feeling well now; she still reports some jitteriness after the Nebulizer w/ Duoneb & I again offered to change to Xopenex but she wants to save $$ therefore try using 1/2 of the vial; she requests VentolinHFA refill- she tells me she stopped the Prilosec, Amitrip, and Breo on her own "I don't like it"; we reviewed her CT w/ mod sliding HH and the need for PPI & antireflux regimen (we reviewed this again)...  EXAM reveals Afeb, VSS, O2sat=95% on RA;  HEENT- neg,  mallampati1;  Chest- essentially clear w/o w/r/r;  Heart- RR w/o m/r/g;  Abd- soft, neg;  Ext- neg w/o c/c/e... IMP/PLAN>>  Vickii's symptoms are improved w/ a combination of Klonopin & Antireflux regimen- we reviewed this treatment & directions for continuing these going forward & adjusting the doses; she will call for any breathing problems and plan recheck in 6 months...   ~  May 29, 2015:  60mo ROV & Jaila is 82 y/o now- PCP is DrECorey & DrCMetheney in [place] & she is checked monthly (notes reviewed in Epic)- last 04/06/15 for dysuria, UTI (Rx [place])... Today she is c/o recurrent UTI symptoms- she had urethral sling surg for stress incont yrs ago by DrDahlstedt, hx recurrent UTIs; c/o freq/ urgency/ urge incont/ known cystocele, occas constipation=> felt to have OB, she did not want additional meds, given exercise training & f/u Urology...    Her breathing is OK, resting well, using NEB w/ 1/2 vial duoneb ave once per day, also has ProairHFA rescue inhaler (hasn't needed); she is taking the Klonopin 0.5mg Bid, and following the antireflux regimen w/ Prilosec20/d, & doesn't eat or drink much after dinner (she has not elev the [facility] 6" as requested)- recall that she has a modHH on CT Chest... we reviewed the following medical problems during today's office visit >>     Reported mild-mod airflow obstruction w/ an apparent reversible component> she has a min smoking hx & therefore not likely COPD so I favor RADS- this fits w/ her hx of trouble since 9/15 bout of pneumonia;  She was on a good regimen w/ BREO (she subseq stopped on her own), & Albuterol via rescue inhaler or NEB w/ Duoneb ~1/2 vial in NEB prn...    Hiatus Hernia w/ GERD & LPR> on Prilosec20, and add vigorous antireflux regimen.    Anxiety & throat tightness-- improved on Klonopin 0.5mg  bid. EXAM reveals Afeb, VSS, O2sat=97% on RA;  HEENT- neg, mallampati1;  Chest- essentially clear w/o w/r/r;  Heart- RR w/o m/r/g;  Abd- soft, neg;  Ext- neg  w/o c/c/e...  LABS 01/2015 in Epic>  Chems- wnl;  CBC- wnl;  TSH=1.74 IMP/PLAN>>  Yanelly is stable from the pulm standpoint on her NEB, rescue inhaler prn, and Klonopin 0.5Bid;  She has HH & LPR & needs more vigorously applied antireflux regimen=> Prilosec before dinner, NPO after dinner, Elevate HOB 6", etc... Needs better exercise program & she will take advantage of gym at church;  She will also f/u w/ Uroogy, drDahlstedt...    Past Medical History  Diagnosis Date  . Hypertension >> on ToprolXL 25mg /d...   . Hyperlipidemia >> on diet alone...   . Anxiety      Situational >> on Zoloft 50mg /d...  . Insomnia    Mod sliding HH on CT scan >> rec to take Prilosec20 before dinner & antireflux regimen   .  Diverticulitis >> on Miralax   . Arthritis   . Fibromyalgia >> off Elavil25 now as the Klonopin works better she says   . Asthma >> on VentolinHFA vs NEB w/ Duoneb prn   . COPD (chronic obstructive pulmonary disease) => Spirometry 04/2014 was wnl     Dyspnea >> improved w/ Klonopin 0.5- 1/2 to 1 Bid    Past Surgical History  Procedure Laterality Date  . Abdominal hysterectomy    . Bladder surgery      tac  . Colonoscopy      2008    Outpatient Encounter Prescriptions as of 05/29/2015  Medication Sig  . albuterol (PROVENTIL HFA;VENTOLIN HFA) 108 (90 BASE) MCG/ACT inhaler Inhale 2 puffs into the lungs every 6 (six) hours as needed.  . calcium carbonate 1250 MG capsule Take 1,250 mg by mouth daily.   . Cholecalciferol (VITAMIN D3) 1000 UNITS tablet Take 1,000 Units by mouth daily.    . clonazePAM (KLONOPIN) 0.5 MG tablet TAKE ONE TABLET BY MOUTH TWICE DAILY.  Marland Kitchen ibuprofen (ADVIL,MOTRIN) 200 MG tablet Take 200 mg by mouth every 6 (six) hours as needed.  . metoprolol succinate (TOPROL-XL) 25 MG 24 hr tablet TAKE ONE TABLET BY MOUTH ONCE DAILY  . Multiple Vitamin (MULTIVITAMIN) tablet Take 1 tablet by mouth daily.    . Multiple Vitamins-Minerals (PRESERVISION/LUTEIN) CAPS Take 1 capsule by  mouth daily.   Marland Kitchen omeprazole (PRILOSEC) 20 MG capsule Take 1 capsule (20 mg total) by mouth daily. (Patient taking differently: Take 20 mg by mouth as needed. )  . polyethylene glycol powder (GLYCOLAX/MIRALAX) powder Take 1 capful (17 g) daily in 1/2 (4 ounces) of liquid.  Skip a day if you have diarrhea.  . Pyridoxine HCl (VITAMIN B-6) 500 MG tablet Take 500 mg by mouth daily.    . sertraline (ZOLOFT) 50 MG tablet Take 1 tablet (50 mg total) by mouth daily. Patient needs to schedule a follow up appointment before more refills.  . vitamin B-12 (CYANOCOBALAMIN) 1000 MCG tablet Take 1,000 mcg by mouth daily.    . [DISCONTINUED] cefdinir (OMNICEF) 300 MG capsule Take 1 capsule (300 mg total) by mouth 2 (two) times daily.   No facility-administered encounter medications on file as of 05/29/2015.    Allergies  Allergen Reactions  . Doxycycline Other (See Comments)    Blisters on mouth and lips  . Pravachol [Pravastatin]     Fatigued.   . Trazodone And Nefazodone Other (See Comments)    Made her hyper  . Zocor [Simvastatin] Other (See Comments)    Myalgias and memory loss    Immunization History  Administered Date(s) Administered  . Influenza Split 02/18/2012  . Influenza Whole 05/04/2009  . Influenza,inj,Quad PF,36+ Mos 02/25/2013  . Influenza-Unspecified 02/21/2014  . Pneumococcal Polysaccharide-23 07/03/2004  . Tdap 02/18/2011  . Zoster 02/21/2013    .Current Medications, Allergies, Past Medical History, Past Surgical History, Family History, and Social History were reviewed in [employer] record.   Review of Systems            All symptoms NEG except where BOLDED >>  Constitutional:  F/C/S, fatigue, anorexia, unexpected weight change. HEENT:  HA, visual changes, hearing loss, earache, nasal symptoms, sore throat, mouth sores, hoarseness. Resp:  cough, sputum, hemoptysis; SOB, tightness, wheezing. Cardio:  CP, palpit, DOE, orthopnea, edema. GI:  N/V/D/C,  blood in stool; reflux, abd pain, distention, gas. GU:  dysuria, freq, urgency, hematuria, flank pain, voiding difficulty. MS:  joint pain, swelling, tenderness,  decr ROM; neck pain, back pain, etc. Neuro:  HA, tremors, seizures, dizziness, syncope, weakness, numbness, gait abn. Skin:  suspicious lesions or skin rash. Heme:  adenopathy, bruising, bleeding. Psyche:  confusion, agitation, sleep disturbance, hallucinations, anxiety, depression suicidal.   Objective:   Physical Exam      Vital Signs:  Reviewed...   General:  WD, WN, 82 y/o WF in NAD; alert & oriented; pleasant & cooperative... HEENT:  [place]/AT; Conjunctiva- pink, Sclera- nonicteric, EOM-wnl, PERRLA, EACs-clear, TMs-wnl; NOSE-clear; THROAT- sl red w/o lesions. Neck:  Supple w/ fair ROM; no JVD; normal carotid impulses w/o bruits; no thyromegaly or nodules palpated; no lymphadenopathy.  Chest:  Clear to P & A; without wheezes, rales, or rhonchi heard. Heart:  Regular Rhythm; norm S1 & S2 without murmurs, rubs, or gallops detected. Abdomen:  Soft & nontender- no guarding or rebound; normal bowel sounds; no organomegaly or masses palpated. Ext:  decrROM; no deformities or arthritic changes; no varicose veins, venous insuffic, or edema;  Pulses intact w/o bruits. Neuro:  No focal neuro deficits; gait normal & balance OK. Derm:  No lesions noted; no rash etc. Lymph:  No cervical, supraclavicular, axillary, or inguinal adenopathy palpated.   Assessment:      IMP >>  Reported mild-mod airflow obstruction w/ an apparent reversible component> she didn't smoke hardly at all & therefore not likely COPD so I favor RADS- this fits w/ her hx of trouble since 9/15 bout of pneumonia;  She is on a good regimen w/ BREO, & Albuterol via rescue inhaler or NEB, offered change to Xopenex if needed but she prefers to use the Duoneb Rx she has a supply at home...  Hiatus Hernia w/ GERD & LPR> on Prilosec20, and add vigorous antireflux  regimen. Anxiety & throat tightness-- improved on Klonopin 0.5mg  bid.  PLAN >>  11/21/14>   Analiz's symptoms are improved w/ a combination of Klonopin & Antireflux regimen- we reviewed this treatment & directions for continuing these going forward & adjusting the doses; she will call for any breathing problems and plan recheck in 6 months 05/29/15>   Emmalynn is stable from the pulm standpoint on her NEB, rescue inhaler prn, and Klonopin 0.5Bid;  She has HH & LPR & needs more vigorously applied antireflux regimen=> Prilosec before dinner, NPO after dinner, Elevate HOB 6", etc... Needs better exercise program & she will take advantage of gym at church;  She will also f/u w/ Uroogy, DrDahlstedt.     Plan:     Patient's Medications  New Prescriptions   No medications on file  Previous Medications   ALBUTEROL (PROVENTIL HFA;VENTOLIN HFA) 108 (90 BASE) MCG/ACT INHALER    Inhale 2 puffs into the lungs every 6 (six) hours as needed.   CALCIUM CARBONATE 1250 MG CAPSULE    Take 1,250 mg by mouth daily.    CHOLECALCIFEROL (VITAMIN D3) 1000 UNITS TABLET    Take 1,000 Units by mouth daily.     CLONAZEPAM (KLONOPIN) 0.5 MG TABLET    TAKE ONE TABLET BY MOUTH TWICE DAILY.   IBUPROFEN (ADVIL,MOTRIN) 200 MG TABLET    Take 200 mg by mouth every 6 (six) hours as needed.   METOPROLOL SUCCINATE (TOPROL-XL) 25 MG 24 HR TABLET    TAKE ONE TABLET BY MOUTH ONCE DAILY   MULTIPLE VITAMIN (MULTIVITAMIN) TABLET    Take 1 tablet by mouth daily.     MULTIPLE VITAMINS-MINERALS (PRESERVISION/LUTEIN) CAPS    Take 1 capsule by mouth daily.    OMEPRAZOLE (  PRILOSEC) 20 MG CAPSULE    Take 1 capsule (20 mg total) by mouth daily.   POLYETHYLENE GLYCOL POWDER (GLYCOLAX/MIRALAX) POWDER    Take 1 capful (17 g) daily in 1/2 (4 ounces) of liquid.  Skip a day if you have diarrhea.   PYRIDOXINE HCL (VITAMIN B-6) 500 MG TABLET    Take 500 mg by mouth daily.     SERTRALINE (ZOLOFT) 50 MG TABLET    Take 1 tablet (50 mg total) by mouth daily.  Patient needs to schedule a follow up appointment before more refills.   VITAMIN B-12 (CYANOCOBALAMIN) 1000 MCG TABLET    Take 1,000 mcg by mouth daily.    Modified Medications   No medications on file  Discontinued Medications   CEFDINIR (OMNICEF) 300 MG CAPSULE    Take 1 capsule (300 mg total) by mouth 2 (two) times daily.

## 2015-05-30 DIAGNOSIS — Z23 Encounter for immunization: Secondary | ICD-10-CM | POA: Diagnosis not present

## 2015-06-26 ENCOUNTER — Encounter: Payer: Self-pay | Admitting: Family Medicine

## 2015-06-26 ENCOUNTER — Ambulatory Visit (INDEPENDENT_AMBULATORY_CARE_PROVIDER_SITE_OTHER): Payer: Medicare Other | Admitting: Family Medicine

## 2015-06-26 VITALS — BP 144/70 | HR 70 | Wt 162.0 lb

## 2015-06-26 DIAGNOSIS — I10 Essential (primary) hypertension: Secondary | ICD-10-CM

## 2015-06-26 DIAGNOSIS — F411 Generalized anxiety disorder: Secondary | ICD-10-CM | POA: Diagnosis not present

## 2015-06-26 DIAGNOSIS — Z87440 Personal history of urinary (tract) infections: Secondary | ICD-10-CM | POA: Diagnosis not present

## 2015-06-26 DIAGNOSIS — J449 Chronic obstructive pulmonary disease, unspecified: Secondary | ICD-10-CM | POA: Diagnosis not present

## 2015-06-26 DIAGNOSIS — K2100 Gastro-esophageal reflux disease with esophagitis, without bleeding: Secondary | ICD-10-CM

## 2015-06-26 MED ORDER — SERTRALINE HCL 50 MG PO TABS
50.0000 mg | ORAL_TABLET | Freq: Every day | ORAL | Status: DC
Start: 2015-06-26 — End: 2016-02-03

## 2015-06-26 MED ORDER — ALBUTEROL SULFATE HFA 108 (90 BASE) MCG/ACT IN AERS: 2.0000 | INHALATION_SPRAY | Freq: Four times a day (QID) | RESPIRATORY_TRACT | Status: DC | PRN

## 2015-06-26 NOTE — Progress Notes (Signed)
   Subjective:    Patient ID: Beth [name], female    DOB: Mar 13, 1933, 82 y.o.   MRN: [id number]  HPI F/U COPD - she is doing well. No recent flares.  She would like a refill on her albuterol. She is Not on an anticholinergic at this point times. She just uses her albuterol as needed.  Hypertension- Pt denies chest pain, SOB, dizziness, or heart palpitations.  Taking meds as directed w/o problems.  Denies medication side effects.  She has lost about 4 lbs since last here.   GAD- she is doing well. She is doing well on zoloft.  She is usind clonazepam. She taks a half tab BID.   GERD with esophagitis she takes her prilosec once a day and does stil have reflux some.   Review of Systems     Objective:   Physical Exam  Constitutional: She is oriented to person, place, and time. She appears well-developed and well-nourished.  HENT:  Head: Normocephalic and atraumatic.  Cardiovascular: Normal rate, regular rhythm and normal heart sounds.   Pulmonary/Chest: Effort normal and breath sounds normal.  Neurological: She is alert and oriented to person, place, and time.  Skin: Skin is warm and dry.  Psychiatric: She has a normal mood and affect. Her behavior is normal.        Assessment & Plan:  COPD - Stable with when necessary albuterol use. Continue to monitor.  HTN -  well controlled on current regimen. Follow-up in 6 months.  GAD - GAD- 7 score 3, down from 5. Doing well on Zoloft. Continue current regimen. Follow-up in 6 months.  GERD - she consider like try increasing her PPI to twice a day for couple weeks if you to get symptoms back under control and then go back down to once a day. Next  Recurrent urinary tract infections-encouraged her to speak with her urologist about possibly using a topical estrogen cream.

## 2015-06-26 NOTE — Patient Instructions (Signed)
Talk with the Urologist about topical estrogen cream to outside urethra and vagina to reduce risk of UTIs.

## 2015-07-01 ENCOUNTER — Other Ambulatory Visit: Payer: Self-pay | Admitting: Family Medicine

## 2015-07-02 ENCOUNTER — Other Ambulatory Visit: Payer: Self-pay | Admitting: Pulmonary Disease

## 2015-07-03 ENCOUNTER — Telehealth: Payer: Self-pay | Admitting: Pulmonary Disease

## 2015-07-03 NOTE — Telephone Encounter (Signed)
Spoke with pt, requesting refill on clonazepam 0.5mg . Last refill 04/27/2015 #60 with 1 refill. Pt requesting this refill be called in to [facility] on [address].    SN please advise on recs.  Thanks.

## 2015-07-04 MED ORDER — CLONAZEPAM 0.5 MG PO TABS: 0.5000 mg | ORAL_TABLET | Freq: Two times a day (BID) | ORAL | Status: DC

## 2015-07-04 NOTE — Telephone Encounter (Signed)
Pt calling back needs this refill [phone]

## 2015-07-04 NOTE — Telephone Encounter (Signed)
Per SN: okay to refill Clonazepam 0.5mg  This has been called into [facility] on [address] #60 x 1 refill.  Patient aware. Nothing further needed.

## 2015-07-11 ENCOUNTER — Telehealth: Payer: Self-pay | Admitting: Internal Medicine

## 2015-07-12 NOTE — Telephone Encounter (Signed)
Pt states that after she eats she has bloating and pain below her rib cage. States she also has problems with constipation and yellow stools. States she has seen some mucous in the stool but no blood. Pt requesting to be seen. Pt scheduled to see Alonza Bogus PA 07/18/15@1 :30pm. Pt aware of appt.

## 2015-07-18 ENCOUNTER — Other Ambulatory Visit: Payer: Medicare Other

## 2015-07-18 ENCOUNTER — Ambulatory Visit (INDEPENDENT_AMBULATORY_CARE_PROVIDER_SITE_OTHER): Payer: Medicare Other | Admitting: Gastroenterology

## 2015-07-18 ENCOUNTER — Encounter: Payer: Self-pay | Admitting: Gastroenterology

## 2015-07-18 VITALS — BP 138/80 | HR 62 | Ht 66.0 in | Wt 161.0 lb

## 2015-07-18 DIAGNOSIS — R140 Abdominal distension (gaseous): Secondary | ICD-10-CM | POA: Diagnosis not present

## 2015-07-18 DIAGNOSIS — R1010 Upper abdominal pain, unspecified: Secondary | ICD-10-CM

## 2015-07-18 DIAGNOSIS — R197 Diarrhea, unspecified: Secondary | ICD-10-CM

## 2015-07-18 MED ORDER — SUPREP BOWEL PREP KIT 17.5-3.13-1.6 GM/177ML PO SOLN: ORAL | Status: DC

## 2015-07-18 NOTE — Patient Instructions (Addendum)
  Your physician has requested that you go to the basement for the following lab work before leaving today: Stool studies  Use Imodium as needed for the diarrhea.   You have been scheduled for an abdominal ultrasound at [facility] (1st floor of hospital) on 07/23/15 at 11:00AM. Please arrive 15 minutes prior to your appointment for registration. Make certain not to have anything to eat or drink 6 hours prior to your appointment. Should you need to reschedule your appointment, please contact radiology at [phone]. This test typically takes about 30 minutes to perform.    You have been scheduled for a colonoscopy. Please follow written instructions given to you at your visit today.  Please come pick up your sample prep kit when we call you. If you use inhalers (even only as needed), please bring them with you on the day of your procedure.   I appreciate the opportunity to care for you.

## 2015-07-19 ENCOUNTER — Other Ambulatory Visit: Payer: Medicare Other

## 2015-07-19 DIAGNOSIS — R197 Diarrhea, unspecified: Secondary | ICD-10-CM

## 2015-07-20 DIAGNOSIS — R197 Diarrhea, unspecified: Secondary | ICD-10-CM | POA: Insufficient documentation

## 2015-07-20 DIAGNOSIS — R140 Abdominal distension (gaseous): Secondary | ICD-10-CM | POA: Insufficient documentation

## 2015-07-20 DIAGNOSIS — R1010 Upper abdominal pain, unspecified: Secondary | ICD-10-CM | POA: Insufficient documentation

## 2015-07-20 LAB — CLOSTRIDIUM DIFFICILE BY PCR: CDIFFPCR: NOT DETECTED

## 2015-07-20 NOTE — Progress Notes (Addendum)
     07/20/2015 YULI BEEM [id number] [birth year]   History of Present Illness:   This is an 82 year old female who returns to our office after seven-month hiatus with ongoing complaints of diarrhea.   She says that she has intermittently continued to have problems with diarrhea since she was last seen here, but the last 3 weeks to 3 months have been terrible. She says that everything that she tries to eat or drink goes right through so she has been living on bread in water since these things do not seem to bother her stomach. She tells me that as soon as she eats and it hits her stomach she starts having abdominal pain and immediately has yellow stools in the form of diarrhea that she says contain mucus. She also reports feeling feeling extremely bloated. She says that when she has diarrhea the bile or acid causes her bottom to burn like fire.   She denies any complaints of heartburn, reflux, indigestion, or difficulty swallowing.  She is taking a daily probiotic.  She is concerned about her gallbladder.  Her last colonoscopy was in 2008 by Dr. Cristina Gong at which time she had a good prep and it was normal except for diverticulosis.   Current Medications, Allergies, Past Medical History, Past Surgical History, Family History and Social History were reviewed in [employer] record.   Physical Exam: BP 138/80 mmHg  Pulse 62  Ht 5\' 6"  (1.676 m)  Wt 161 lb (73.029 kg)  BMI 26.00 kg/m2 General: Well developed white female in no acute distress Head: Normocephalic and atraumatic Eyes:  Sclerae anicteric, conjunctiva pink  Ears: Normal auditory acuity Lungs: Clear throughout to auscultation Heart: Regular rate and rhythm Abdomen: Soft, non-distended.  Normal bowel sounds.  Non-tender. Rectal:  Will be done at the time of colonoscopy. Musculoskeletal: Symmetrical with no gross deformities  Extremities: No edema  Neurological: Alert oriented x 4, grossly  non-focal Psychological:  Alert and cooperative. Normal mood and affect  Assessment and Recommendations: -82 year old female with ongoing complaints of diarrhea with bloating and abdominal pain:  Will check stool for Cdiff and will check a pancreatic elastase as well.  She will also be scheduled for colonoscopy with Dr. Hilarie Fredrickson (consider random biopsies to rule out microscopic colitis).  The risks, benefits, and alternatives to colonoscopy were discussed with the patient and she consents to proceed.  Will check abdominal ultrasound for patient's gallbladder concern.  Can use Imodium prn in the interim as long as Cdiff is negative.  Addendum: Reviewed and agree with initial management. Jerene Bears, MD

## 2015-07-23 ENCOUNTER — Ambulatory Visit (HOSPITAL_COMMUNITY)
Admission: RE | Admit: 2015-07-23 | Discharge: 2015-07-23 | Disposition: A | Discharge status: Home / Self Care | Payer: Medicare Other | Source: Ambulatory Visit | Attending: Gastroenterology | Admitting: Gastroenterology

## 2015-07-23 DIAGNOSIS — R140 Abdominal distension (gaseous): Secondary | ICD-10-CM | POA: Diagnosis present

## 2015-07-23 DIAGNOSIS — R197 Diarrhea, unspecified: Secondary | ICD-10-CM | POA: Diagnosis not present

## 2015-07-23 DIAGNOSIS — N281 Cyst of kidney, acquired: Secondary | ICD-10-CM | POA: Insufficient documentation

## 2015-07-23 DIAGNOSIS — R1010 Upper abdominal pain, unspecified: Secondary | ICD-10-CM

## 2015-07-25 LAB — PANCREATIC ELASTASE, FECAL: Pancreatic Elastase-1, Stool: 488 mcg/g

## 2015-07-30 ENCOUNTER — Other Ambulatory Visit: Payer: Self-pay | Admitting: Family Medicine

## 2015-08-02 ENCOUNTER — Encounter: Payer: Self-pay | Admitting: Internal Medicine

## 2015-08-02 ENCOUNTER — Ambulatory Visit (AMBULATORY_SURGERY_CENTER): Payer: Medicare Other | Admitting: Internal Medicine

## 2015-08-02 VITALS — BP 154/72 | HR 63 | Temp 99.3°F | Resp 13 | Ht 66.0 in | Wt 161.0 lb

## 2015-08-02 DIAGNOSIS — D128 Benign neoplasm of rectum: Secondary | ICD-10-CM

## 2015-08-02 DIAGNOSIS — D129 Benign neoplasm of anus and anal canal: Secondary | ICD-10-CM

## 2015-08-02 DIAGNOSIS — K626 Ulcer of anus and rectum: Secondary | ICD-10-CM | POA: Diagnosis not present

## 2015-08-02 DIAGNOSIS — K633 Ulcer of intestine: Secondary | ICD-10-CM | POA: Diagnosis not present

## 2015-08-02 DIAGNOSIS — R197 Diarrhea, unspecified: Secondary | ICD-10-CM | POA: Diagnosis present

## 2015-08-02 DIAGNOSIS — K621 Rectal polyp: Secondary | ICD-10-CM | POA: Diagnosis not present

## 2015-08-02 MED ORDER — SODIUM CHLORIDE 0.9 % IV SOLN: 500.0000 mL | INTRAVENOUS | Status: DC

## 2015-08-02 MED ORDER — RANITIDINE HCL 150 MG PO TABS: 150.0000 mg | ORAL_TABLET | Freq: Two times a day (BID) | ORAL | Status: DC

## 2015-08-02 NOTE — Progress Notes (Signed)
Patient awakening,vss,report to rn 

## 2015-08-02 NOTE — Op Note (Signed)
[facility] Patient Name: Beth [name] Procedure Date: 08/02/2015 3:46 PM MRN: [id number] Endoscopist: Jerene Bears , MD Age: 82 Date of Birth: 02/16/1933 Gender: Female Procedure:                Colonoscopy Indications:              Abdominal pain, Clinically significant diarrhea of                            unexplained origin, abdominal bloating, last                            colonoscopy Jan 2008 with Dr. Cristina Gong Medicines:                Monitored Anesthesia Care Procedure:                Pre-Anesthesia Assessment:                           - Prior to the procedure, a History and Physical                            was performed, and patient medications and                            allergies were reviewed. The patient's tolerance of                            previous anesthesia was also reviewed. The risks                            and benefits of the procedure and the sedation                            options and risks were discussed with the patient.                            All questions were answered, and informed consent                            was obtained. Prior Anticoagulants: The patient has                            taken no previous anticoagulant or antiplatelet                            agents. ASA Grade Assessment: III - A patient with                            severe systemic disease. After reviewing the risks                            and benefits, the patient was deemed in  satisfactory condition to undergo the procedure.                           After obtaining informed consent, the colonoscope                            was passed under direct vision. Throughout the                            procedure, the patient's blood pressure, pulse, and                            oxygen saturations were monitored continuously. The                            Model PCF-H190L [id number]) scope was introduced                             through the anus and advanced to the the cecum,                            identified by appendiceal orifice and ileocecal                            valve. The colonoscopy was performed without                            difficulty. The patient tolerated the procedure                            well. The quality of the bowel preparation was                            good. The ileocecal valve, appendiceal orifice, and                            rectum were photographed. Scope In: 4:06:49 PM Scope Out: 4:23:13 PM Scope Withdrawal Time: 0 hours 12 minutes 34 seconds  Total Procedure Duration: 0 hours 16 minutes 24 seconds  Findings:                 The digital rectal exam was normal.                           A single (solitary) fifteen mm ulcer was found in                            the mid rectum. No bleeding was present. No                            stigmata of recent bleeding were seen. Multiple                            biopsies were obtained with cold forceps for  histology.                           A 5 mm polyp was found in the rectum. The polyp was                            sessile. The polyp was removed with a cold snare.                            Resection and retrieval were complete.                           Many small and large-mouthed diverticula were found                            from transverse colon to sigmoid colon. There was                            narrowing of the colon in association with the                            diverticular opening.                           The exam was otherwise normal throughout the                            examined colon.                           Biopsies for histology were taken with a cold                            forceps from the right colon and left colon for                            evaluation of microscopic colitis. Complications:            No immediate  complications. Estimated Blood Loss:     Estimated blood loss: none. Impression:               - A single (solitary) ulcer in the mid rectum.                            Multiple biopsies were obtained.                           - One 5 mm polyp in the rectum, removed with a cold                            snare. Resected and retrieved.                           - Severe diverticulosis from transverse colon to  sigmoid colon. There was narrowing of the colon in                            association with the diverticular openings.                           - Biopsies were taken with a cold forceps from the                            right colon and left colon for evaluation of                            microscopic colitis. Recommendation:           - Patient has a contact number available for                            emergencies. The signs and symptoms of potential                            delayed complications were discussed with the                            patient. Return to normal activities tomorrow.                            Written discharge instructions were provided to the                            patient.                           - High fiber diet.                           - Continue present medications.                           - Avoid additional aspirin, ibuprofen, naproxen, or                            other non-steroidal anti-inflammatory drugs.                           - Await pathology results.                           - No repeat colonoscopy due to age.                           - Continue probiotic daily                           - Return to GI clinic at the next available  appointment.                           - Discontinue PPI and trial of ranitidine 150 mg                            twice daily given burning epigastric pain. Jerene Bears, MD 08/02/2015 4:35:56 PM This report has been signed  electronically. CC Letter to:             PCP

## 2015-08-02 NOTE — Progress Notes (Signed)
Called to room to assist during endoscopic procedure.  Patient ID and intended procedure confirmed with present staff. Received instructions for my participation in the procedure from the performing physician.  

## 2015-08-02 NOTE — Patient Instructions (Signed)
Avoid additional Aspirin, NSAIDS (Ibuprofen, Advil, Aleve, Naproxen etc.)  Return to GI Clinic at next available appointment.  Continue your Probiotic daily. Trial of Ranitidine 150 mg twice daily.   YOU HAD AN ENDOSCOPIC PROCEDURE TODAY AT [facility]:   Refer to the procedure report that was given to you for any specific questions about what was found during the examination.  If the procedure report does not answer your questions, please call your gastroenterologist to clarify.  If you requested that your care partner not be given the details of your procedure findings, then the procedure report has been included in a sealed envelope for you to review at your convenience later.  YOU SHOULD EXPECT: Some feelings of bloating in the abdomen. Passage of more gas than usual.  Walking can help get rid of the air that was put into your GI tract during the procedure and reduce the bloating. If you had a lower endoscopy (such as a colonoscopy or flexible sigmoidoscopy) you may notice spotting of blood in your stool or on the toilet paper. If you underwent a bowel prep for your procedure, you may not have a normal bowel movement for a few days.  Please Note:  You might notice some irritation and congestion in your nose or some drainage.  This is from the oxygen used during your procedure.  There is no need for concern and it should clear up in a day or so.  SYMPTOMS TO REPORT IMMEDIATELY:   Following lower endoscopy (colonoscopy or flexible sigmoidoscopy):  Excessive amounts of blood in the stool  Significant tenderness or worsening of abdominal pains  Swelling of the abdomen that is new, acute  Fever of 100F or higher  For urgent or emergent issues, a gastroenterologist can be reached at any hour by calling [phone].   DIET: Your first meal following the procedure should be a small meal and then it is ok to progress to your normal diet. Heavy or fried foods are harder to  digest and may make you feel nauseous or bloated.  Likewise, meals heavy in dairy and vegetables can increase bloating.  Drink plenty of fluids but you should avoid alcoholic beverages for 24 hours.  ACTIVITY:  You should plan to take it easy for the rest of today and you should NOT DRIVE or use heavy machinery until tomorrow (because of the sedation medicines used during the test).    FOLLOW UP: Our staff will call the number listed on your records the next business day following your procedure to check on you and address any questions or concerns that you may have regarding the information given to you following your procedure. If we do not reach you, we will leave a message.  However, if you are feeling well and you are not experiencing any problems, there is no need to return our call.  We will assume that you have returned to your regular daily activities without incident.  If any biopsies were taken you will be contacted by phone or by letter within the next 1-3 weeks.  Please call us at [phone] if you have not heard about the biopsies in 3 weeks.    SIGNATURES/CONFIDENTIALITY: You and/or your care partner have signed paperwork which will be entered into your electronic medical record.  These signatures attest to the fact that that the information above on your After Visit Summary has been reviewed and is understood.  Full responsibility of the confidentiality of this discharge information  lies with you and/or your care-partner.

## 2015-08-03 ENCOUNTER — Telehealth: Payer: Self-pay

## 2015-08-03 NOTE — Telephone Encounter (Signed)
  Follow up Call-  Call back number 08/02/2015  Post procedure Call Back phone  # [phone]  Permission to leave phone message Yes    Patient was called for follow up after her procedure on 08/02/2015. No answer at the number given for follow up phone call. A message was left on the answering machine.

## 2015-08-13 ENCOUNTER — Encounter: Payer: Self-pay | Admitting: Internal Medicine

## 2015-08-31 ENCOUNTER — Other Ambulatory Visit: Payer: Self-pay | Admitting: Pulmonary Disease

## 2015-08-31 ENCOUNTER — Other Ambulatory Visit: Payer: Self-pay | Admitting: Family Medicine

## 2015-09-14 ENCOUNTER — Encounter: Payer: Self-pay | Admitting: *Deleted

## 2015-09-25 ENCOUNTER — Ambulatory Visit (INDEPENDENT_AMBULATORY_CARE_PROVIDER_SITE_OTHER): Payer: Medicare Other | Admitting: Family Medicine

## 2015-09-25 ENCOUNTER — Encounter: Payer: Self-pay | Admitting: Family Medicine

## 2015-09-25 VITALS — BP 119/64 | HR 69 | Wt 159.0 lb

## 2015-09-25 DIAGNOSIS — J449 Chronic obstructive pulmonary disease, unspecified: Secondary | ICD-10-CM | POA: Diagnosis not present

## 2015-09-25 DIAGNOSIS — I10 Essential (primary) hypertension: Secondary | ICD-10-CM

## 2015-09-25 DIAGNOSIS — F411 Generalized anxiety disorder: Secondary | ICD-10-CM

## 2015-09-25 MED ORDER — METOPROLOL SUCCINATE ER 25 MG PO TB24: 25.0000 mg | ORAL_TABLET | Freq: Every day | ORAL | Status: DC

## 2015-09-25 NOTE — Patient Instructions (Signed)
Recommend My Fitness pal to track calories and set calorie goal or consider Weight Watchers again.   Recommend aerobic exercise ( sweating and increased heart rate) for 30 minutes 5 days per week.

## 2015-09-25 NOTE — Progress Notes (Signed)
Subjective:    CC: HTN, COPD  HPI:  Hypertension- Pt denies chest pain, SOB, dizziness, or heart palpitations.  Taking meds as directed w/o problems.  Denies medication side effects.    COPD - no recent flares.  Rarely using her albuterol.  Anxiety - Doing well overall. Having some difficulty still with sleep and feeling a little restless. But otherwise denies any depressive symptoms. She still on her Zoloft and using her clonazepam as needed.  Past medical history, Surgical history, Family history not pertinant except as noted below, Social history, Allergies, and medications have been entered into the medical record, reviewed, and corrections made.   Review of Systems: No fevers, chills, night sweats, weight loss, chest pain, or shortness of breath.   Objective:    General: Well Developed, well nourished, and in no acute distress.  Neuro: Alert and oriented x3, extra-ocular muscles intact, sensation grossly intact.  HEENT: Normocephalic, atraumatic  Skin: Warm and dry, no rashes. Cardiac: Regular rate and rhythm, no murmurs rubs or gallops, no lower extremity edema.  Respiratory: Clear to auscultation bilaterally. Not using accessory muscles, speaking in full sentences.   Impression and Recommendations:    HTN - Well controlled. Continue current regimen. Follow up in 6 months.   GAD- GAD 7 score of 7 and PHQ 9 score of 0. She rates her symptoms is somewhat difficult.  COPD- Stable. No recent flares.

## 2015-09-26 LAB — BASIC METABOLIC PANEL
BUN: 10 mg/dL (ref 7–25)
CHLORIDE: 101 mmol/L (ref 98–110)
CO2: 28 mmol/L (ref 20–31)
Calcium: 9.2 mg/dL (ref 8.6–10.4)
Creat: 0.56 mg/dL — ABNORMAL LOW (ref 0.60–0.88)
GLUCOSE: 84 mg/dL (ref 65–99)
Potassium: 4.5 mmol/L (ref 3.5–5.3)
SODIUM: 137 mmol/L (ref 135–146)

## 2015-10-11 ENCOUNTER — Encounter: Payer: Self-pay | Admitting: Internal Medicine

## 2015-10-11 ENCOUNTER — Ambulatory Visit (INDEPENDENT_AMBULATORY_CARE_PROVIDER_SITE_OTHER): Payer: Medicare Other | Admitting: Internal Medicine

## 2015-10-11 VITALS — BP 122/70 | HR 66 | Ht 66.0 in | Wt 158.4 lb

## 2015-10-11 DIAGNOSIS — K219 Gastro-esophageal reflux disease without esophagitis: Secondary | ICD-10-CM

## 2015-10-11 DIAGNOSIS — R1310 Dysphagia, unspecified: Secondary | ICD-10-CM | POA: Diagnosis not present

## 2015-10-11 DIAGNOSIS — R1013 Epigastric pain: Secondary | ICD-10-CM | POA: Diagnosis not present

## 2015-10-11 DIAGNOSIS — K626 Ulcer of anus and rectum: Secondary | ICD-10-CM

## 2015-10-11 DIAGNOSIS — Z8719 Personal history of other diseases of the digestive system: Secondary | ICD-10-CM

## 2015-10-11 DIAGNOSIS — K5730 Diverticulosis of large intestine without perforation or abscess without bleeding: Secondary | ICD-10-CM

## 2015-10-11 DIAGNOSIS — K5900 Constipation, unspecified: Secondary | ICD-10-CM

## 2015-10-11 MED ORDER — OMEPRAZOLE 40 MG PO CPDR: 40.0000 mg | DELAYED_RELEASE_CAPSULE | Freq: Every day | ORAL | Status: DC

## 2015-10-11 NOTE — Progress Notes (Signed)
Subjective:    Patient ID: Beth [name], female    DOB: 07/16/1932, 82 y.o.   MRN: [id number]  HPI Beth [name] is an 82 year old female with a past medical history of irritable bowel, severe diverticulosis, constipation with solitary rectal ulcer, GERD with hiatal hernia who is here for follow-up. She was last seen at the time of colonoscopy performed on 08/02/2015. This was performed for abdominal pain, bloating and clinically significant diarrhea. This revealed a solitary 15 mm rectal ulcer without bleeding. Biopsies of this were benign and consistent with solitary rectal ulcer syndrome. A 5 mm polyp was removed from the rectum and found to be a prolapse-type polyp. There were many diverticula from the transverse to sigmoid colon associated with narrowing in the left colon. Random biopsies were negative for microscopic colitis.  Today her biggest complaint is upper GI symptoms. From a colon standpoint she reports she still has issues with constipation and what she gets rid of the "hard brick" she will have loose stools. She is tried [place] but has not committed to taking this daily. At times he can cause diarrhea. She has not had rectal bleeding or melena. She does continue to report issues with abdominal bloating.  She reports significant issues with burning chest discomfort and trouble swallowing. She also feels epigastric discomfort and bloating. She has a hard time swallowing pills and she states it feels like when she swallows something is "scraping" down her esophagus. She has improved symptoms by propping her head of the bed and chewing very well with all meals. She is eating smaller more frequent meals. I recommended ranitidine at the time of her colonoscopy but she reports she can't take this because the pill becomes lodged. She feels like food has a hard time clearing her esophagus. Her only she is not on antacid therapy. She had an upper endoscopy performed in November 1999 by Dr.  Sharlett Iles. 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  Review of Systems As per history of present illness, otherwise negative  Current Medications, Allergies, Past Medical History, Past Surgical History, Family History and Social History were reviewed in [employer] record.     Objective:   Physical Exam BP 122/70 mmHg  Pulse 66  Ht 5\' 6"  (1.676 m)  Wt 158 lb 6.4 oz (71.85 kg)  BMI 25.58 kg/m2 Constitutional: Well-developed and well-nourished. No distress. HEENT: Normocephalic and atraumatic. Oropharynx is clear and moist. No oropharyngeal exudate. Conjunctivae are normal.  No scleral icterus. Neck: Neck supple. Trachea midline. Cardiovascular: Normal rate, regular rhythm and intact distal pulses. No M/R/G Pulmonary/chest: Effort normal, Mild and expiratory wheeze without rales or rhonchi. Abdominal: Soft, Epigastric tenderness to deep palpation without rebound or guarding, nondistended. Bowel sounds active throughout. There are no masses palpable. No hepatosplenomegaly. Extremities: no clubbing, cyanosis, or edema Neurological: Alert and oriented to person place and time. Skin: Skin is warm and dry. No rashes noted. Psychiatric: Normal mood and affect. Behavior is normal.     Assessment & Plan:  82 year old female with a past medical history of irritable bowel, severe diverticulosis, constipation with solitary rectal ulcer, GERD with hiatal hernia who is here for follow-up  1.Constipation and severe diverticulosis -- I feel that her biggest issue remains constipation and at times she will have overflow diarrhea. This is supported by the presence of solitary rectal ulcer which was fortunately benign. I recommended that she resume MiraLAX  either full dose Monday Wednesday and Friday or half dose daily. She is willing to commit to taking this on a regular basis which  should prevent the severe constipation which then leads to further trouble for her.  2. Dysphagia/odynophagia/epigastric pain/GERD -- history of large hiatal hernia. I have recommended barium swallow with tablet along with upper GI series to evaluate esophageal motility, the large hiatal hernia. We will follow this with upper endoscopy to evaluate odynophagia and dysphagia and consider dilation. She is not currently on reflux therapy due to trouble swallowing ranitidine. I recommended lansoprazole dissolving tabs which was not covered by her insurance. I will have her do omeprazole 40 mg, open the capsule sprinkled on applesauce and take the medication daily 30 minutes to an hour before eating. We discussed the risks, benefits and alternatives to upper endoscopy and she is agreeable to proceed.  She should continue the dietary modifications such as chewing food very well, taking small bites, and eating smaller more frequent meals. Remain upright 90 minutes after eating and continue to prop the head of bed.  25 minutes spent with the patient today. Greater than 50% was spent in counseling and coordination of care with the patient

## 2015-10-11 NOTE — Patient Instructions (Signed)
You have been scheduled for an endoscopy. Please follow written instructions given to you at your visit today. If you use inhalers (even only as needed), please bring them with you on the day of your procedure. Your physician has requested that you go to www.startemmi.com and enter the access code given to you at your visit today. This web site gives a general overview about your procedure. However, you should still follow specific instructions given to you by our office regarding your preparation for the procedure.  You have been scheduled for a Barium Esophogram and upper GI at [facility] (1st floor of the hospital) on 10/25/15 at 10:30 am. Please arrive 15 minutes prior to your appointment for registration. Make certain not to have anything to eat or drink 6 hours prior to your test. If you need to reschedule for any reason, please contact radiology at [phone] to do so. __________________________________________________________________ We have sent the following medications to your pharmacy for you to pick up at your convenience: Omeprazole 40 mg-open 1 capsule and sprinkle contents onto applesauce and eat each morning. Wait 30 minutes before eating.  Please purchase the following medications over the counter and take as directed: Miralax 17 grams - Take every Monday, Wednesday and Friday OR take 1/2 dose (9 grams) once every day.  If you are age 29 or older, your body mass index should be between 23-30. Your Body mass index is 25.58 kg/(m^2). If this is out of the aforementioned range listed, please consider follow up with your Primary Care Provider.  If you are age 78 or younger, your body mass index should be between 19-25. Your Body mass index is 25.58 kg/(m^2). If this is out of the aformentioned range listed, please consider follow up with your Primary Care Provider.

## 2015-10-16 ENCOUNTER — Encounter: Payer: Self-pay | Admitting: Sports Medicine

## 2015-10-16 ENCOUNTER — Ambulatory Visit (INDEPENDENT_AMBULATORY_CARE_PROVIDER_SITE_OTHER): Payer: Medicare Other | Admitting: Sports Medicine

## 2015-10-16 VITALS — BP 130/74 | HR 68 | Temp 97.9°F | Wt 160.0 lb

## 2015-10-16 DIAGNOSIS — R300 Dysuria: Secondary | ICD-10-CM

## 2015-10-16 DIAGNOSIS — Z87440 Personal history of urinary (tract) infections: Secondary | ICD-10-CM

## 2015-10-16 LAB — POCT URINALYSIS DIPSTICK
Bilirubin, UA: NEGATIVE
Glucose, UA: NEGATIVE
Ketones, UA: NEGATIVE
Nitrite, UA: NEGATIVE
Protein, UA: NEGATIVE
Spec Grav, UA: 1.015
Urobilinogen, UA: 0.2
pH, UA: 7.5

## 2015-10-16 MED ORDER — CEPHALEXIN 500 MG PO CAPS: 500.0000 mg | ORAL_CAPSULE | Freq: Two times a day (BID) | ORAL | Status: DC

## 2015-10-16 NOTE — Progress Notes (Signed)
  Subjective:    CC: Urinary symptoms  HPI: This is a pleasant 82 year old female, for the past day she's had increasing urinary urgency, abnormal odor. She has a history of recurrent UTIs. Symptoms are moderate, persistent without radiation. No discomfort in the back or the suprapubic region. No vaginal discharge, no constitutional symptoms.  Past medical history, Surgical history, Family history not pertinant except as noted below, Social history, Allergies, and medications have been entered into the medical record, reviewed, and no changes needed.   Review of Systems: No fevers, chills, night sweats, weight loss, chest pain, or shortness of breath.   Objective:    General: Well Developed, well nourished, and in no acute distress.  Neuro: Alert and oriented x3, extra-ocular muscles intact, sensation grossly intact.  HEENT: Normocephalic, atraumatic, pupils equal round reactive to light, neck supple, no masses, no lymphadenopathy, thyroid nonpalpable.  Skin: Warm and dry, no rashes. Cardiac: Regular rate and rhythm, no murmurs rubs or gallops, no lower extremity edema.  Respiratory: Clear to auscultation bilaterally. Not using accessory muscles, speaking in full sentences. Abdomen: Soft, nontender, nondistended, no bowel sounds, no palpable masses, guarding, rigidity, rebound tenderness.  Urinalysis reviewed and shows microhematuria and pyuria.  Impression and Recommendations:

## 2015-10-16 NOTE — Assessment & Plan Note (Signed)
Recent urine culture grew out pansensitive Escherichia coli, adding Keflex for 7 days. She does have recurrent UTIs, maybe 3 or 4 per year at the most, at the next UTI we can consider chronic suppressive therapy.

## 2015-10-19 LAB — URINE CULTURE: Colony Count: >=100000

## 2015-10-23 ENCOUNTER — Encounter: Payer: Self-pay | Admitting: Internal Medicine

## 2015-10-23 ENCOUNTER — Ambulatory Visit (AMBULATORY_SURGERY_CENTER): Payer: Medicare Other | Admitting: Internal Medicine

## 2015-10-23 ENCOUNTER — Telehealth: Payer: Self-pay | Admitting: *Deleted

## 2015-10-23 VITALS — BP 128/64 | HR 58 | Temp 98.7°F | Resp 17 | Ht 66.0 in | Wt 158.0 lb

## 2015-10-23 DIAGNOSIS — R1013 Epigastric pain: Secondary | ICD-10-CM

## 2015-10-23 DIAGNOSIS — K219 Gastro-esophageal reflux disease without esophagitis: Secondary | ICD-10-CM

## 2015-10-23 DIAGNOSIS — R1310 Dysphagia, unspecified: Secondary | ICD-10-CM

## 2015-10-23 MED ORDER — SUCRALFATE 1 G PO TABS: 1.0000 g | ORAL_TABLET | Freq: Three times a day (TID) | ORAL | Status: DC

## 2015-10-23 MED ORDER — SODIUM CHLORIDE 0.9 % IV SOLN: 500.0000 mL | INTRAVENOUS | Status: DC

## 2015-10-23 NOTE — Op Note (Addendum)
[facility] Patient Name: Beth [name] Procedure Date: 10/23/2015 11:13 AM MRN: [id number] Endoscopist: Jerene Bears , MD Age: 82 Referring MD:  Date of Birth: 08/13/1932 Gender: Female Account #: [id number] Procedure:                Upper GI endoscopy Indications:              Epigastric abdominal pain, Dysphagia, Odynophagia,                            Gastro-esophageal reflux disease Medicines:                Monitored Anesthesia Care Procedure:                Pre-Anesthesia Assessment:                           - Prior to the procedure, a History and Physical                            was performed, and patient medications and                            allergies were reviewed. The patient's tolerance of                            previous anesthesia was also reviewed. The risks                            and benefits of the procedure and the sedation                            options and risks were discussed with the patient.                            All questions were answered, and informed consent                            was obtained. Prior Anticoagulants: The patient has                            taken no previous anticoagulant or antiplatelet                            agents. ASA Grade Assessment: III - A patient with                            severe systemic disease. After reviewing the risks                            and benefits, the patient was deemed in                            satisfactory condition to undergo the procedure.  After obtaining informed consent, the endoscope was                            passed under direct vision. Throughout the                            procedure, the patient's blood pressure, pulse, and                            oxygen saturations were monitored continuously. The                            Model GIF-HQ190 [id number]) scope was introduced                            through the mouth,  and advanced to the second part                            of duodenum. The upper GI endoscopy was                            accomplished without difficulty. The patient                            tolerated the procedure well. Scope In: Scope Out: Findings:                 The middle third of the esophagus and lower third                            of the esophagus were moderately tortuous likely                            related to esophageal dysmotility.                           Esophagogastric landmarks were identified: the                            gastroesophageal junction was found at 33 cm and                            the site of hiatal narrowing was found at 40 cm                            from the incisors.                           A low-grade of narrowing Schatzki ring (acquired)                            was found at the gastroesophageal junction. A TTS                            dilator was passed  through the scope. Dilation with                            a 13.5-14.5-15.5 mm balloon dilator was performed                            to 15.5 mm. The dilation site was examined and                            showed mild improvement in luminal narrowing with                            superficial mucosal tear at the ring.                           A 7 cm hiatal hernia was present.                           Two non-bleeding, localized, linear, Cameron's                            erosions were found in the gastric body near                            diaphragmatic hiatus.                           The exam of the stomach was otherwise normal.                           The examined duodenum was normal. Complications:            No immediate complications. Estimated Blood Loss:     Estimated blood loss: none. Impression:               - Tortuous esophagus indicative of esophageal                            dysmotility.                           - Low-grade of narrowing  Schatzki ring. Dilated.                           - 7 cm hiatal hernia.                           - Gastric Cameron's erosions without bleeding.                           - Normal examined duodenum.                           - No specimens collected. Recommendation:           - Patient has a contact number available for  emergencies. The signs and symptoms of potential                            delayed complications were discussed with the                            patient. Return to normal activities tomorrow.                            Written discharge instructions were provided to the                            patient.                           - Soft diet.                           - Continue present medications.                           - Await barium esophagram already ordered. Office                            follow-up thereafter. Jerene Bears, MD 10/23/2015 11:43:34 AM This report has been signed electronically. Addendum Number: 1   Addendum Date: 10/23/2015 12:21:45 PM      After discussion in post-op, I'm adding carafate slurry 1 g TID-AC and       HS given ongoing burning epigastric pain despite omeprazole 40 mg daily.      She will continue PPI also Jerene Bears, MD 10/23/2015 12:22:33 PM This report has been signed electronically.

## 2015-10-23 NOTE — Telephone Encounter (Signed)
-----   Message from Jerene Bears, MD sent at 10/23/2015 12:21 PM EDT ----- Please call in sucralfate tabs and tell her to make slurry TIDAC and HS

## 2015-10-23 NOTE — Telephone Encounter (Signed)
Rx sent to pharmacy   

## 2015-10-23 NOTE — Progress Notes (Signed)
Report to PACU, RN, vss, BBS= Clear.  

## 2015-10-23 NOTE — Progress Notes (Signed)
Called to room to assist during endoscopic procedure.  Patient ID and intended procedure confirmed with present staff. Received instructions for my participation in the procedure from the performing physician.  

## 2015-10-23 NOTE — Patient Instructions (Signed)
YOU HAD AN ENDOSCOPIC PROCEDURE TODAY AT [facility]:   Refer to the procedure report that was given to you for any specific questions about what was found during the examination.  If the procedure report does not answer your questions, please call your gastroenterologist to clarify.  If you requested that your care partner not be given the details of your procedure findings, then the procedure report has been included in a sealed envelope for you to review at your convenience later.  YOU SHOULD EXPECT: Some feelings of bloating in the abdomen. Passage of more gas than usual.  Walking can help get rid of the air that was put into your GI tract during the procedure and reduce the bloating. If you had a lower endoscopy (such as a colonoscopy or flexible sigmoidoscopy) you may notice spotting of blood in your stool or on the toilet paper. If you underwent a bowel prep for your procedure, you may not have a normal bowel movement for a few days.  Please Note:  You might notice some irritation and congestion in your nose or some drainage.  This is from the oxygen used during your procedure.  There is no need for concern and it should clear up in a day or so.  SYMPTOMS TO REPORT IMMEDIATELY:   Following upper endoscopy (EGD)  Vomiting of blood or coffee ground material  New chest pain or pain under the shoulder blades  Painful or persistently difficult swallowing  New shortness of breath  Fever of 100F or higher  Black, tarry-looking stools  For urgent or emergent issues, a gastroenterologist can be reached at any hour by calling [phone].   DIET: Your first meal following the procedure should be a small meal and then it is ok to progress to your normal diet. Heavy or fried foods are harder to digest and may make you feel nauseous or bloated.  Likewise, meals heavy in dairy and vegetables can increase bloating.  Drink plenty of fluids but you should avoid alcoholic beverages for  24 hours.  ACTIVITY:  You should plan to take it easy for the rest of today and you should NOT DRIVE or use heavy machinery until tomorrow (because of the sedation medicines used during the test).    FOLLOW UP: Our staff will call the number listed on your records the next business day following your procedure to check on you and address any questions or concerns that you may have regarding the information given to you following your procedure. If we do not reach you, we will leave a message.  However, if you are feeling well and you are not experiencing any problems, there is no need to return our call.  We will assume that you have returned to your regular daily activities without incident.  If any biopsies were taken you will be contacted by phone or by letter within the next 1-3 weeks.  Please call us at [phone] if you have not heard about the biopsies in 3 weeks.    SIGNATURES/CONFIDENTIALITY: You and/or your care partner have signed paperwork which will be entered into your electronic medical record.  These signatures attest to the fact that that the information above on your After Visit Summary has been reviewed and is understood.  Full responsibility of the confidentiality of this discharge information lies with you and/or your care-partner.  Dilation diet handout provided. Hiatal hernia handout provided.  Soft Diet. Continue present medications.

## 2015-10-24 ENCOUNTER — Telehealth: Payer: Self-pay

## 2015-10-24 NOTE — Telephone Encounter (Signed)
  Follow up Call-  Call back number 10/23/2015 08/02/2015  Post procedure Call Back phone  # [phone]  Permission to leave phone message Yes Yes     Patient was called for follow up after her procedure on 10/23/2015. No answer at the number given for follow up phone call. A message was left on the answering machine.

## 2015-10-25 ENCOUNTER — Ambulatory Visit (HOSPITAL_COMMUNITY)
Admission: RE | Admit: 2015-10-25 | Discharge: 2015-10-25 | Disposition: A | Discharge status: Home / Self Care | Payer: Medicare Other | Source: Ambulatory Visit | Attending: Internal Medicine | Admitting: Internal Medicine

## 2015-10-25 DIAGNOSIS — K224 Dyskinesia of esophagus: Secondary | ICD-10-CM | POA: Diagnosis not present

## 2015-10-25 DIAGNOSIS — Z8719 Personal history of other diseases of the digestive system: Secondary | ICD-10-CM

## 2015-10-25 DIAGNOSIS — R1310 Dysphagia, unspecified: Secondary | ICD-10-CM

## 2015-10-25 DIAGNOSIS — K449 Diaphragmatic hernia without obstruction or gangrene: Secondary | ICD-10-CM | POA: Insufficient documentation

## 2015-11-06 ENCOUNTER — Encounter: Payer: Self-pay | Admitting: Family Medicine

## 2015-11-06 ENCOUNTER — Ambulatory Visit (INDEPENDENT_AMBULATORY_CARE_PROVIDER_SITE_OTHER): Payer: Medicare Other

## 2015-11-06 ENCOUNTER — Ambulatory Visit (INDEPENDENT_AMBULATORY_CARE_PROVIDER_SITE_OTHER): Payer: Medicare Other | Admitting: Family Medicine

## 2015-11-06 VITALS — BP 131/56 | HR 70 | Temp 97.7°F | Wt 160.0 lb

## 2015-11-06 DIAGNOSIS — N760 Acute vaginitis: Secondary | ICD-10-CM | POA: Diagnosis not present

## 2015-11-06 DIAGNOSIS — R3915 Urgency of urination: Secondary | ICD-10-CM

## 2015-11-06 DIAGNOSIS — L299 Pruritus, unspecified: Secondary | ICD-10-CM

## 2015-11-06 DIAGNOSIS — I517 Cardiomegaly: Secondary | ICD-10-CM | POA: Diagnosis not present

## 2015-11-06 DIAGNOSIS — R0602 Shortness of breath: Secondary | ICD-10-CM

## 2015-11-06 DIAGNOSIS — R05 Cough: Secondary | ICD-10-CM

## 2015-11-06 DIAGNOSIS — R059 Cough, unspecified: Secondary | ICD-10-CM

## 2015-11-06 LAB — CBC WITH DIFFERENTIAL/PLATELET
Basophils Absolute: 64 cells/uL (ref 0–200)
Basophils Relative: 1 %
EOS ABS: 320 {cells}/uL (ref 15–500)
Eosinophils Relative: 5 %
HEMATOCRIT: 37.6 % (ref 35.0–45.0)
Hemoglobin: 12.3 g/dL (ref 11.7–15.5)
LYMPHS PCT: 24 %
Lymphs Abs: 1536 cells/uL (ref 850–3900)
MCH: 29.2 pg (ref 27.0–33.0)
MCHC: 32.7 g/dL (ref 32.0–36.0)
MCV: 89.3 fL (ref 80.0–100.0)
MONO ABS: 576 {cells}/uL (ref 200–950)
MPV: 11.8 fL (ref 7.5–12.5)
Monocytes Relative: 9 %
NEUTROS PCT: 61 %
Neutro Abs: 3904 cells/uL (ref 1500–7800)
Platelets: 236 10*3/uL (ref 140–400)
RBC: 4.21 MIL/uL (ref 3.80–5.10)
RDW: 14.7 % (ref 11.0–15.0)
WBC: 6.4 10*3/uL (ref 3.8–10.8)

## 2015-11-06 LAB — POCT URINALYSIS DIPSTICK
Bilirubin, UA: NEGATIVE
GLUCOSE UA: NEGATIVE
Ketones, UA: NEGATIVE
NITRITE UA: NEGATIVE
Protein, UA: NEGATIVE
RBC UA: NEGATIVE
Spec Grav, UA: 1.02
UROBILINOGEN UA: 0.2
pH, UA: 6

## 2015-11-06 LAB — WET PREP, GENITAL
TRICH WET PREP: NONE SEEN
YEAST WET PREP: NONE SEEN

## 2015-11-06 NOTE — Addendum Note (Signed)
Addended by: Beatrice Lecher D on: 11/06/2015 04:58 PM   Modules accepted: Miquel Dunn

## 2015-11-06 NOTE — Progress Notes (Signed)
Subjective:    CC:   HPI: 82 yo female came into the office on June 20 approximately 3 weeks ago and planning of increased urinary urgency with abnormal odor. She was treated with Keflex. Urine culture was positive for Escherichia coli, which was pansensitive.She says she did feel better for about a week after taking the Keflex but then started feeling poorly again. Having night sweats x 2 weeks and still having urinary urgency. She now feels like She has a yeast infection from the antibiotics. She has some vaginal itching and irritation. She has also been having episodes of diffuse itching. Had a small rash for a couple of days on her  right wrist after working in the garden. That has resolved. But she feels like she itches all over.  Pain over her mid back, bilat.  Just feels sore.  No abdominal pain. No abnormal weight loss.     Past medical history, Surgical history, Family history not pertinant except as noted below, Social history, Allergies, and medications have been entered into the medical record, reviewed, and corrections made.   Review of Systems: No fevers, chills, night sweats, weight loss, chest pain, or shortness of breath.   Objective:    General: Well Developed, well nourished, and in no acute distress.  Neuro: Alert and oriented x3, extra-ocular muscles intact, sensation grossly intact.  HEENT: Normocephalic, atraumatic, Oropharynx is clear. TMs and canals are clear bilaterally. No cervical lymphadenopathy. Skin: Warm and dry, no rashes. Cardiac: Regular rate and rhythm, no murmurs rubs or gallops, no lower extremity edema.  Respiratory: Clear to auscultation bilaterally. Not using accessory muscles, speaking in full sentences. Abd: Soft, nontender, no organomegaly. She has a very large hiatal hernia that causes significant bulging of her abdomen.   Impression and Recommendations:   Urinary urgency - Urinalysis shows small amount of leukocytes. sent urine culture. Will call  with results once available. Concerning if her symptoms have recurred somewhat to see if the culture grows out Escherichia coli again or a different bacteria. I would like to consider CT of the abdomen and pelvis for further evaluation as well.   Vaginitis- will perform wet prep. Will call with results once available.  SOB/cough- will get CXR. Pulse ox reassuring. Some very fine crackles at the bases bilaterally actually think this is her baseline. I am concerned for possible secondary infection.  Pruritus-will check to see if she has elevated liver enzymes or bilirubin.   Due for Prevnar 13 today.  She was not feeling well today.

## 2015-11-07 LAB — TSH: TSH: 2.27 mIU/L

## 2015-11-07 LAB — COMPLETE METABOLIC PANEL WITH GFR
ALT: 11 U/L (ref 6–29)
AST: 15 U/L (ref 10–35)
Albumin: 3.8 g/dL (ref 3.6–5.1)
Alkaline Phosphatase: 65 U/L (ref 33–130)
BUN: 17 mg/dL (ref 7–25)
CALCIUM: 9.2 mg/dL (ref 8.6–10.4)
CHLORIDE: 104 mmol/L (ref 98–110)
CO2: 25 mmol/L (ref 20–31)
Creat: 0.75 mg/dL (ref 0.60–0.88)
GFR, Est African American: 86 mL/min (ref >=60)
GFR, Est Non African American: 75 mL/min (ref >=60)
Glucose, Bld: 90 mg/dL (ref 65–99)
POTASSIUM: 4.5 mmol/L (ref 3.5–5.3)
Sodium: 138 mmol/L (ref 135–146)
Total Bilirubin: 0.3 mg/dL (ref 0.2–1.2)
Total Protein: 6.4 g/dL (ref 6.1–8.1)

## 2015-11-07 LAB — GAMMA GT: GGT: 12 U/L (ref 7–51)

## 2015-11-07 MED ORDER — METRONIDAZOLE 500 MG PO TABS: 500.0000 mg | ORAL_TABLET | Freq: Two times a day (BID) | ORAL | Status: DC

## 2015-11-07 NOTE — Addendum Note (Signed)
Addended by: Beatrice Lecher D on: 11/07/2015 03:59 PM   Modules accepted: Orders, SmartSet

## 2015-11-08 LAB — URINE CULTURE: Colony Count: 70000

## 2015-11-14 ENCOUNTER — Ambulatory Visit (HOSPITAL_BASED_OUTPATIENT_CLINIC_OR_DEPARTMENT_OTHER)
Admission: RE | Admit: 2015-11-14 | Discharge: 2015-11-14 | Disposition: A | Discharge status: Home / Self Care | Payer: Medicare Other | Source: Ambulatory Visit | Attending: Family Medicine | Admitting: Family Medicine

## 2015-11-14 DIAGNOSIS — I517 Cardiomegaly: Secondary | ICD-10-CM | POA: Diagnosis present

## 2015-11-14 DIAGNOSIS — F419 Anxiety disorder, unspecified: Secondary | ICD-10-CM | POA: Insufficient documentation

## 2015-11-14 DIAGNOSIS — E785 Hyperlipidemia, unspecified: Secondary | ICD-10-CM | POA: Insufficient documentation

## 2015-11-14 DIAGNOSIS — J449 Chronic obstructive pulmonary disease, unspecified: Secondary | ICD-10-CM | POA: Diagnosis not present

## 2015-11-14 DIAGNOSIS — M797 Fibromyalgia: Secondary | ICD-10-CM | POA: Insufficient documentation

## 2015-11-14 DIAGNOSIS — G4700 Insomnia, unspecified: Secondary | ICD-10-CM | POA: Insufficient documentation

## 2015-11-14 DIAGNOSIS — I119 Hypertensive heart disease without heart failure: Secondary | ICD-10-CM | POA: Diagnosis not present

## 2015-11-14 NOTE — Progress Notes (Signed)
  Echocardiogram 2D Echocardiogram has been performed.  Beth [name] 11/14/2015, 10:54 AM

## 2015-11-26 ENCOUNTER — Ambulatory Visit: Payer: Medicare Other | Admitting: Pulmonary Disease

## 2015-12-05 ENCOUNTER — Other Ambulatory Visit: Payer: Self-pay | Admitting: *Deleted

## 2015-12-05 MED ORDER — ALBUTEROL SULFATE HFA 108 (90 BASE) MCG/ACT IN AERS: 2.0000 | INHALATION_SPRAY | Freq: Four times a day (QID) | RESPIRATORY_TRACT | 2 refills | Status: DC | PRN

## 2015-12-09 ENCOUNTER — Other Ambulatory Visit: Payer: Self-pay | Admitting: Pulmonary Disease

## 2015-12-10 ENCOUNTER — Encounter: Payer: Self-pay | Admitting: *Deleted

## 2015-12-13 ENCOUNTER — Ambulatory Visit (INDEPENDENT_AMBULATORY_CARE_PROVIDER_SITE_OTHER): Payer: Medicare Other | Admitting: Pulmonary Disease

## 2015-12-13 ENCOUNTER — Encounter: Payer: Self-pay | Admitting: Pulmonary Disease

## 2015-12-13 VITALS — BP 124/72 | HR 73 | Temp 97.5°F | Ht 66.0 in | Wt 159.4 lb

## 2015-12-13 DIAGNOSIS — K219 Gastro-esophageal reflux disease without esophagitis: Secondary | ICD-10-CM

## 2015-12-13 DIAGNOSIS — K449 Diaphragmatic hernia without obstruction or gangrene: Secondary | ICD-10-CM | POA: Diagnosis not present

## 2015-12-13 DIAGNOSIS — F411 Generalized anxiety disorder: Secondary | ICD-10-CM

## 2015-12-13 DIAGNOSIS — J4520 Mild intermittent asthma, uncomplicated: Secondary | ICD-10-CM

## 2015-12-13 DIAGNOSIS — J387 Other diseases of larynx: Secondary | ICD-10-CM

## 2015-12-13 MED ORDER — AZELASTINE HCL 0.1 % NA SOLN: 2.0000 | Freq: Two times a day (BID) | NASAL | 6 refills | Status: DC

## 2015-12-13 NOTE — Progress Notes (Signed)
Subjective:     Patient ID: Beth [name], female   DOB: 01-Nov-1932, 82 y.o.   MRN: [id number]  HPI ~  October 23, 2014:  Initial consult by SN>        51 y/o WF, referred by Dr. Beatrice Lecher [facility] in [place]), for a pulmonary evaluation>  Beth [name] relates a circuitous hx starting ~12/2013 when she felt bad & went to the ER for eval, diagnosed w/ pneumonia (RLL) & was Hosp x 3d;  Records reviewed in EPIC> CAP (nos), COPD (?asthma ?bronchiectasis), HBP, HH, Divertics, HA, anxiety/depression;  Treated w/ Rocephin & Zithromax, then changed to Levaquin at disch also w/ an AlbutHFA rescue inhaler for prn use, Tussionex & Mucinex;  Since then she has noted upper resp symptoms w/ some hoarseness, cough, prob LPR & reflux w/ throat tightness; she notes that she will occas choke on water;  otherw she gives a long hx w/ mult somatic complaints- pain in ribs and back, discomfort worse w/ movement etc, note hx fibromyalgia...       Beth [name] is essentially a never-smoker having smoked some in her teens- from 38 to 47 then quit & never restarted;  She tells me she had pneumonia as a child, then several bouts as an adult (under the care of Dr.Carlton Harris) but no known sequelae;  She was never told to have Asthma, and heard about poss COPD for the 1st time last yr;  She does recall "allergies" but never formally tested...  She denies any occupational exposures and FamHx is NEG for lung diseases...       Current Meds> Breo one puff daily, NEBS w/ Duoneb but not using regularly due to some jitteriness...  EXAM reveals Afeb, VSS, O2sat=94% on RA;  HEENT- neg, mallampati1;  Chest- essentially clear w/o w/r/r;  Heart- RR w/o m/r/g;  Abd- soft, neg;  Ext- neg w/o c/c/e...  2DEcho 11/12 showed mild LVH, norm LVF w/ EF=60%, no regional wall motion abn, no cardiac source of embolism found...  Spirometry 7/14 showed FVC=2.21 (88%), FEV1=1.32 (71%), %1sec=60, mid-flows were reduced at 49% predicted... c/w mod airflow  obstruction.  EKG 9/15 showed NSR, rate100, PACs, NSSTTWA, NAD...  CT Abd&Pelvis 9/15 showed patchy airsp dis in RLL w/ focal bronchial wall thickening, mod sized sliding HH, plus 62mm left renal cyst/ s/p hyst/ extensive colonic divertics, diffuse DDD...  Spirometry 1/16 showed FVC=2.22 (87%), FEV1=1.71 (91%), %1sec=77, mid-flows were wnl at 110% predicted... This study is WNL.Marland KitchenMarland Kitchen  CXR 4/16 showed borderline cardiomeg. right mid lung scarring, some hyperinflation, kyphosis, NAD...   LABS EPIC review>  CBC- wnl x eos=10%;  Chems- wnl;    IMP/PLAN>>  Beth [name] had some evid of airflow obstruction on Spirometry in 2014 but improved (a reversible asthmatic component) on her more recent study;  She has been on the [facility] & appears improved;  Her current symptoms sound more like LPR, reflux-related w/ throat tightness etc;  We discussed a good antireflux regimen w/ PPI about 30 min before dinner, NPO after dinner, elev HOB on 6" blocks, etc;  In addition I would like to try her on a "combination relaxer" to help her throat tightness AND help her rest better at night- KLONOPIN 0.5mg  bid should be beneficial... I offered to switch her Duoneb to Xopenex in hopes of decreasing any jitteriness for the beta agonist but she prefers to keep these meds the same for now... REC an ROV recheck in [name]...   ~  November 21, 2014:  13mo  ROV & Beth [name] reports that she is improved on the Klonopin Rx and the antireflux regimen; she specifically notes feeling rested, alert, feeling well now; she still reports some jitteriness after the Nebulizer w/ Duoneb & I again offered to change to Xopenex but she wants to save $$ therefore try using 1/2 of the vial; she requests VentolinHFA refill- she tells me she stopped the Prilosec, Amitrip, and Breo on her own "I don't like it"; we reviewed her CT w/ mod sliding HH and the need for PPI & antireflux regimen (we reviewed this again)...  EXAM reveals Afeb, VSS, O2sat=95% on RA;  HEENT- neg,  mallampati1;  Chest- essentially clear w/o w/r/r;  Heart- RR w/o m/r/g;  Abd- soft, neg;  Ext- neg w/o c/c/e... IMP/PLAN>>  Beth [name]'s symptoms are improved w/ a combination of Klonopin & Antireflux regimen- we reviewed this treatment & directions for continuing these going forward & adjusting the doses; she will call for any breathing problems and plan recheck in 6 months...   ~  May 29, 2015:  7mo ROV & Beth [name] is 82 y/o now- PCP is DrECorey & DrCMetheney in [place] & she is checked monthly (notes reviewed in Epic)- last 04/06/15 for dysuria, UTI (Rx [place])... Today she is c/o recurrent UTI symptoms- she had urethral sling surg for stress incont yrs ago by DrDahlstedt, hx recurrent UTIs; c/o freq/ urgency/ urge incont/ known cystocele, occas constipation=> felt to have OB, she did not want additional meds, given exercise training & f/u Urology...    Her breathing is OK, resting well, using NEB w/ 1/2 vial duoneb ave once per day, also has ProairHFA rescue inhaler (hasn't needed); she is taking the Klonopin 0.5mg Bid, and following the antireflux regimen w/ Prilosec20/d, & doesn't eat or drink much after dinner (she has not elev the [name] 6" as requested)- recall that she has a modHH on CT Chest... we reviewed the following medical problems during today's office visit >>     Reported mild-mod airflow obstruction w/ an apparent reversible component> she has a min smoking hx & therefore not likely COPD so I favor RADS- this fits w/ her hx of trouble since 9/15 bout of pneumonia;  She was on a good regimen w/ BREO (she subseq stopped on her own), & Albuterol via rescue inhaler or NEB w/ Duoneb ~1/2 vial in NEB prn...    Hiatus Hernia w/ GERD & LPR> on Prilosec20, and add vigorous antireflux regimen.    Anxiety & throat tightness-- improved on Klonopin 0.5mg  bid. EXAM reveals Afeb, VSS, O2sat=97% on RA;  HEENT- neg, mallampati1;  Chest- essentially clear w/o w/r/r;  Heart- RR w/o m/r/g;  Abd- soft, neg;  Ext- neg  w/o c/c/e...  LABS 01/2015 in Epic>  Chems- wnl;  CBC- wnl;  TSH=1.74 IMP/PLAN>>  Berna is stable from the pulm standpoint on her NEB, rescue inhaler prn, and Klonopin 0.5Bid;  She has HH & LPR & needs more vigorously applied antireflux regimen=> Prilosec before dinner, NPO after dinner, Elevate HOB 6", etc... Needs better exercise program & she will take advantage of gym at church;  She will also f/u w/ Uroogy, DrDahlstedt...  ~  December 13, 2015:  43mo ROV & Beth [name] indicates that she's doing alright overall but has several minor somatic compliants- eg states that she stays cold all the time (Labs 10/2015 all wnl including TSH), unable to sleep due to cold feet (pulses ok, no claudication, rec to wear socks Qhs), notes a runny nose (no help w/ OTC  meds- offered astelin rx)), and carafate pill gags her when she takes it (rec to dissolve in water first);  She notes that he breathing is doing well- denies cough, sput, SOB, CP, palpit, f/c/s, etc;  She is not currently taking the Klonopin & we discussed how this can help her rest at night & help w/ the sensation of dyspnea during the day...    Beth [name] continues to f/u w/ PCP- DrCMetheney, & seen 11/06/15 w/ another UTI, treated for vaginitis w/ flagyl...     She has also been followed for GI by DrPyrtle> hx IBS, severe divertics, constipation, HH/ GERD- s/p colonoscopy 07/2015 revealed a rectal ulcer, bx=benign, & some narrowing of the left colon due to severe divertics;  C/o dysphagia & has a known large HH- EGD done 10/23/15 showed tortuous esoph w/ dysmotility, 7cm HH, mild schatzski ring- dilated, 2 cameron erosions, stomach & duodenum ok;  Treated w/ Prilosec & Carafate...     Reported mild-mod airflow obstruction w/ an apparent reversible component> she has a min smoking hx & therefore not likely COPD so I favor RADS- this fits w/ her hx of trouble since 9/15 bout of pneumonia;  She was on a good regimen w/ BREO (she subseq stopped on her own), & Albuterol via  rescue inhaler or NEB w/ Duoneb ~1/2 vial in NEB prn...    Hiatus Hernia w/ GERD & LPR> on Prilosec40 + Carafate from DrPyrtle, and we reviewed vigorous antireflux regimen.    Anxiety & throat tightness-- improved on Klonopin 0.5mg  bid which she is just using prn now... EXAM reveals Afeb, VSS, O2sat=94% on RA;  HEENT- neg, mallampati1;  Chest- essentially clear w/o w/r/r;  Heart- RR w/o m/r/g;  Abd- soft, non-tender, neg;  Ext- neg w/o c/c/e; Neuro- intact w/o focal abn...  CXR 11/06/15>  Borderline heart size, clear lungs w/o airsp dis, DJD Tspine, no change from old films...   2DEcho 11/14/15>  Mild conc LVH, norm LVF w/ EF=55-60%, no regional wall motion abn, norm diastolic parameters, AoV & MV essentially wnl (min MR), mild LAdil (76mm), RV & PAsys were wnl...  LABS 10/2015>  Chems- wnl;  CBC- wnl w/ Hg=12.3;  TSH=2.27 IMP/PLAN>>  Beth [name] is stable from the pulm standpoint- continue prn NEB vs rescue inhaler; advised to stay active etc;  She has signif GI issues managed by DrPyrtle, and GU problems managed by PCP & DrDahlstedt;  Mult minor somatic complaints noted, she has trouble w/ saliency, advised physical activity & mental acuity puzzles etc...    Past Medical History  Diagnosis Date  . Hypertension >> on ToprolXL 25mg /d...   . Hyperlipidemia >> on diet alone...   . Anxiety      Situational >> on Zoloft 50mg /d...  . Insomnia    Mod sliding HH on CT scan >> rec to take Prilosec20 before dinner & antireflux regimen   . Diverticulitis >> on Miralax   . Arthritis   . Fibromyalgia >> off Elavil25 now as the Klonopin works better she says   . Asthma >> on VentolinHFA vs NEB w/ Duoneb prn   . COPD (chronic obstructive pulmonary disease) => Spirometry 04/2014 was wnl     Dyspnea >> improved w/ Klonopin 0.5- 1/2 to 1 Bid    Past Surgical History:  Procedure Laterality Date  . ABDOMINAL HYSTERECTOMY    . BLADDER SURGERY     tac  . COLONOSCOPY     2008    Outpatient Encounter  Prescriptions as of 12/13/2015  Medication Sig  . albuterol (PROVENTIL HFA;VENTOLIN HFA) 108 (90 Base) MCG/ACT inhaler Inhale 2 puffs into the lungs every 6 (six) hours as needed.  . calcium carbonate 1250 MG capsule Take 1,250 mg by mouth daily.   . Cholecalciferol (VITAMIN D3) 1000 UNITS tablet Take 1,000 Units by mouth daily.    . clonazePAM (KLONOPIN) 0.5 MG tablet TAKE ONE TABLET BY MOUTH TWICE DAILY  . ibuprofen (ADVIL,MOTRIN) 200 MG tablet Take 200 mg by mouth every 6 (six) hours as needed.  . metoprolol succinate (TOPROL-XL) 25 MG 24 hr tablet Take 1 tablet (25 mg total) by mouth daily.  . Multiple Vitamin (MULTIVITAMIN) tablet Take 1 tablet by mouth daily.    . Multiple Vitamins-Minerals (PRESERVISION/LUTEIN) CAPS Take 1 capsule by mouth daily.   Marland Kitchen omeprazole (PRILOSEC) 40 MG capsule Take 1 capsule (40 mg total) by mouth daily. Open 1 capsule, sprinkle on applesauce and take once daily by mouth. Wait 30 minutes before eating meal.  . Probiotic Product (PROBIOTIC DAILY PO) Take 1 capsule by mouth daily.  . Pyridoxine HCl (VITAMIN B-6) 500 MG tablet Take 500 mg by mouth daily.    . sertraline (ZOLOFT) 50 MG tablet Take 1 tablet (50 mg total) by mouth daily.  . sucralfate (CARAFATE) 1 g tablet Take 1 tablet (1 g total) by mouth 4 (four) times daily -  before meals and at bedtime. Make into slurry  . vitamin B-12 (CYANOCOBALAMIN) 1000 MCG tablet Take 1,000 mcg by mouth daily.    Marland Kitchen azelastine (ASTELIN) 0.1 % nasal spray Place 2 sprays into both nostrils 2 (two) times daily. Use in each nostril as directed  . [DISCONTINUED] metroNIDAZOLE (FLAGYL) 500 MG tablet Take 1 tablet (500 mg total) by mouth 2 (two) times daily. (Patient not taking: Reported on 12/13/2015)   No facility-administered encounter medications on file as of 12/13/2015.     Allergies  Allergen Reactions  . Doxycycline Other (See Comments)    Blisters on mouth and lips  . Pravachol [Pravastatin]     Fatigued.   . Trazodone  And Nefazodone Other (See Comments)    Made her hyper  . Zocor [Simvastatin] Other (See Comments)    Myalgias and memory loss    Immunization History  Administered Date(s) Administered  . Influenza Split 02/18/2012  . Influenza Whole 05/04/2009  . Influenza,inj,Quad PF,36+ Mos 02/25/2013, 05/30/2015  . Influenza-Unspecified 02/21/2014  . Pneumococcal Polysaccharide-23 07/03/2004  . Tdap 02/18/2011  . Zoster 02/21/2013    .Current Medications, Allergies, Past Medical History, Past Surgical History, Family History, and Social History were reviewed in [employer] record.   Review of Systems            All symptoms NEG except where BOLDED >>  Constitutional:  F/C/S, fatigue, anorexia, unexpected weight change. HEENT:  HA, visual changes, hearing loss, earache, nasal symptoms, sore throat, mouth sores, hoarseness. Resp:  cough, sputum, hemoptysis; SOB, tightness, wheezing. Cardio:  CP, palpit, DOE, orthopnea, edema. GI:  N/V/D/C, blood in stool; reflux, abd pain, distention, gas. GU:  dysuria, freq, urgency, hematuria, flank pain, voiding difficulty. MS:  joint pain, swelling, tenderness, decr ROM; neck pain, back pain, etc. Neuro:  HA, tremors, seizures, dizziness, syncope, weakness, numbness, gait abn. Skin:  suspicious lesions or skin rash. Heme:  adenopathy, bruising, bleeding. Psyche:  confusion, agitation, sleep disturbance, hallucinations, anxiety, depression suicidal.   Objective:   Physical Exam      Vital Signs:  Reviewed...   General:  WD, WN, 83  y/o WF in NAD; alert & oriented; pleasant & cooperative... HEENT:  [place]/AT; Conjunctiva- pink, Sclera- nonicteric, EOM-wnl, PERRLA, EACs-clear, TMs-wnl; NOSE-clear; THROAT- sl red w/o lesions. Neck:  Supple w/ fair ROM; no JVD; normal carotid impulses w/o bruits; no thyromegaly or nodules palpated; no lymphadenopathy.  Chest:  Clear to P & A; without wheezes, rales, or rhonchi heard. Heart:  Regular  Rhythm; norm S1 & S2 without murmurs, rubs, or gallops detected. Abdomen:  Soft & nontender- no guarding or rebound; normal bowel sounds; no organomegaly or masses palpated. Ext:  decrROM; no deformities or arthritic changes; no varicose veins, venous insuffic, or edema;  Pulses intact w/o bruits. Neuro:  No focal neuro deficits; gait normal & balance OK. Derm:  No lesions noted; no rash etc. Lymph:  No cervical, supraclavicular, axillary, or inguinal adenopathy palpated.   Assessment:      IMP >>  Reported mild-mod airflow obstruction w/ an apparent reversible component> she didn't smoke hardly at all & therefore not likely COPD so I favor RADS-  she was on a good regimen w/ BREO, & Albuterol via rescue inhaler or NEB, she stopped the Breo on her own & appears to be doing satis on the Albut prn... Hiatus Hernia w/ GERD & LPR> on Prilosec40 + carafate per DrPyrtle, and we reviewed vigorous antireflux regimen. Anxiety & throat tightness-- improved on Klonopin 0.5mg  bid in the past, now just using this med prn...  PLAN >>  11/21/14>   Beth [name]'s symptoms are improved w/ a combination of Klonopin & Antireflux regimen- we reviewed this treatment & directions for continuing these going forward & adjusting the doses; she will call for any breathing problems and plan recheck in 6 months 05/29/15>   Beth [name] is stable from the pulm standpoint on her NEB, rescue inhaler prn, and Klonopin 0.5Bid;  She has HH & LPR & needs more vigorously applied antireflux regimen=> Prilosec before dinner, NPO after dinner, Elevate HOB 6", etc... Needs better exercise program & she will take advantage of gym at church;  She will also f/u w/ Uroogy, DrDahlstedt. 12/13/15>   Beth [name] is stable from the pulm standpoint- continue prn NEB vs rescue inhaler; advised to stay active etc;  She has signif GI issues managed by DrPyrtle, and GU problems managed by PCP & DrDahlstedt;  Mult minor somatic complaints noted, she has trouble w/  saliency, advised physical activity & mental acuity puzzles etc     Plan:     Patient's Medications  New Prescriptions   AZELASTINE (ASTELIN) 0.1 % NASAL SPRAY    Place 2 sprays into both nostrils 2 (two) times daily. Use in each nostril as directed  Previous Medications   ALBUTEROL (PROVENTIL HFA;VENTOLIN HFA) 108 (90 BASE) MCG/ACT INHALER    Inhale 2 puffs into the lungs every 6 (six) hours as needed.   CALCIUM CARBONATE 1250 MG CAPSULE    Take 1,250 mg by mouth daily.    CHOLECALCIFEROL (VITAMIN D3) 1000 UNITS TABLET    Take 1,000 Units by mouth daily.     CLONAZEPAM (KLONOPIN) 0.5 MG TABLET    TAKE ONE TABLET BY MOUTH TWICE DAILY   IBUPROFEN (ADVIL,MOTRIN) 200 MG TABLET    Take 200 mg by mouth every 6 (six) hours as needed.   METOPROLOL SUCCINATE (TOPROL-XL) 25 MG 24 HR TABLET    Take 1 tablet (25 mg total) by mouth daily.   MULTIPLE VITAMIN (MULTIVITAMIN) TABLET    Take 1 tablet by mouth daily.     MULTIPLE  VITAMINS-MINERALS (PRESERVISION/LUTEIN) CAPS    Take 1 capsule by mouth daily.    OMEPRAZOLE (PRILOSEC) 40 MG CAPSULE    Take 1 capsule (40 mg total) by mouth daily. Open 1 capsule, sprinkle on applesauce and take once daily by mouth. Wait 30 minutes before eating meal.   PROBIOTIC PRODUCT (PROBIOTIC DAILY PO)    Take 1 capsule by mouth daily.   PYRIDOXINE HCL (VITAMIN B-6) 500 MG TABLET    Take 500 mg by mouth daily.     SERTRALINE (ZOLOFT) 50 MG TABLET    Take 1 tablet (50 mg total) by mouth daily.   SUCRALFATE (CARAFATE) 1 G TABLET    Take 1 tablet (1 g total) by mouth 4 (four) times daily -  before meals and at bedtime. Make into slurry   VITAMIN B-12 (CYANOCOBALAMIN) 1000 MCG TABLET    Take 1,000 mcg by mouth daily.    Modified Medications   No medications on file  Discontinued Medications   METRONIDAZOLE (FLAGYL) 500 MG TABLET    Take 1 tablet (500 mg total) by mouth 2 (two) times daily.

## 2015-12-13 NOTE — Patient Instructions (Signed)
Today we updated your med list in our EPIC system...    Continue your current medications the same...  We wrote a new prescription for ASTELIN nasal spray-- 1-2 sprays in each nostil twice daily...  You may also use OTC FLONASE-- 2 sprays in each nostil at bedtime...  Keep up the good work w/ DIET & EXERCISE...  Call for any questions or if we can be of service in any way.Marland KitchenMarland Kitchen

## 2015-12-27 ENCOUNTER — Ambulatory Visit (INDEPENDENT_AMBULATORY_CARE_PROVIDER_SITE_OTHER): Payer: Medicare Other | Admitting: Family Medicine

## 2015-12-27 ENCOUNTER — Encounter: Payer: Self-pay | Admitting: Family Medicine

## 2015-12-27 VITALS — BP 127/69 | HR 65 | Ht 66.0 in | Wt 158.0 lb

## 2015-12-27 DIAGNOSIS — F411 Generalized anxiety disorder: Secondary | ICD-10-CM

## 2015-12-27 DIAGNOSIS — N390 Urinary tract infection, site not specified: Secondary | ICD-10-CM

## 2015-12-27 DIAGNOSIS — R300 Dysuria: Secondary | ICD-10-CM | POA: Insufficient documentation

## 2015-12-27 DIAGNOSIS — J449 Chronic obstructive pulmonary disease, unspecified: Secondary | ICD-10-CM

## 2015-12-27 DIAGNOSIS — Z23 Encounter for immunization: Secondary | ICD-10-CM | POA: Insufficient documentation

## 2015-12-27 LAB — POCT URINALYSIS DIPSTICK
Bilirubin, UA: NEGATIVE
Glucose, UA: NEGATIVE
KETONES UA: NEGATIVE
Nitrite, UA: NEGATIVE
PH UA: 8.5
PROTEIN UA: NEGATIVE
SPEC GRAV UA: 1.015
Urobilinogen, UA: 0.2

## 2015-12-27 MED ORDER — CEPHALEXIN 500 MG PO CAPS: 500.0000 mg | ORAL_CAPSULE | Freq: Three times a day (TID) | ORAL | 0 refills | Status: DC

## 2015-12-27 NOTE — Patient Instructions (Addendum)
  Apply small amount of topical estrogen around the urethra and external vaginal opening. Apply nightly for approximately 2 weeks.  Then okay to decrease down to 3 days per week. For example Monday Wednesday and Friday.  After 4 months can try going down to twice a week application.

## 2015-12-27 NOTE — Progress Notes (Signed)
Subjective:    CC: COPD  HPI:  COPD - Recently saw pulmonology about 2 weeks ago. He has started her on Klonopin and reflux regimen and it was helping improve her respiratory symptoms. He is to follow back up in 6 months with him. He is doing really well and has been asymptomatic. She even started a walking program and has been walking at her church.  Hypertension- Pt denies chest pain, SOB, dizziness, or heart palpitations.  Taking meds as directed w/o problems.  Denies medication side effects.    Mood - anxiety. She's doing well overall. She does complain of feeling nervous and on edge several days of the week and some mild irritability symptoms.   Dysuria about 5 days.  She is getting some incontinence and urgency. No fevers chills or back pain. And drinking cranberry juice for it.  Past medical history, Surgical history, Family history not pertinant except as noted below, Social history, Allergies, and medications have been entered into the medical record, reviewed, and corrections made.   Review of Systems: No fevers, chills, night sweats, weight loss, chest pain, or shortness of breath.   Objective:    General: Well Developed, well nourished, and in no acute distress.  Neuro: Alert and oriented x3, extra-ocular muscles intact, sensation grossly intact.  HEENT: Normocephalic, atraumatic  Skin: Warm and dry, no rashes. Cardiac: Regular rate and rhythm, no murmurs rubs or gallops, no lower extremity edema.  Respiratory: Clear to auscultation bilaterally. Not using accessory muscles, speaking in full sentences.   Impression and Recommendations:   HTN -  Well controlled. Continue current regimen. Follow up in  6 mo.   COPD- stable. Continue to follow-up with Dr. Lenna Gilford in 6 months.  GAD-GAD 7 score of 3 today. Continue current regimen.  UTI - UA pos for trace blood and trace leuks. Will tx with keflex.  Send for culture.  Recurrent urinary tract infections. As is her third UTI  this year. We'll try topical estrogen. She said she artery has a tube of cream at home just hasn't used it.

## 2015-12-28 ENCOUNTER — Ambulatory Visit (INDEPENDENT_AMBULATORY_CARE_PROVIDER_SITE_OTHER): Payer: Medicare Other | Admitting: Internal Medicine

## 2015-12-28 ENCOUNTER — Encounter (INDEPENDENT_AMBULATORY_CARE_PROVIDER_SITE_OTHER): Payer: Self-pay

## 2015-12-28 ENCOUNTER — Encounter: Payer: Self-pay | Admitting: Internal Medicine

## 2015-12-28 VITALS — BP 114/72 | HR 80 | Ht 62.5 in | Wt 159.4 lb

## 2015-12-28 DIAGNOSIS — K224 Dyskinesia of esophagus: Secondary | ICD-10-CM

## 2015-12-28 DIAGNOSIS — K5900 Constipation, unspecified: Secondary | ICD-10-CM

## 2015-12-28 DIAGNOSIS — K219 Gastro-esophageal reflux disease without esophagitis: Secondary | ICD-10-CM | POA: Diagnosis not present

## 2015-12-28 DIAGNOSIS — Z8719 Personal history of other diseases of the digestive system: Secondary | ICD-10-CM | POA: Diagnosis not present

## 2015-12-28 NOTE — Progress Notes (Signed)
Subjective:    Patient ID: Beth [name], female    DOB: 10-02-1932, 82 y.o.   MRN: [id number]  HPI Beth [name] is an 82 year old female with history of GERD, hiatal hernia, IBS, severe diverticulosis, constipation solitary rectal ulcer who is here for follow-up. She was seen in the office on 10/11/2015 to evaluate dysphagia, odynophagia epigastric pain and reflux. We also discussed her constipation at that time. Upper endoscopy was performed after this visit along with upper GI series. The endoscopy, performed on 10/23/2015, showed tortuous esophagus, Schatzki's ring at the GE junction, 64 m hiatal hernia, 2 nonbleeding linear Cameron's erosions. The GE junction was dilated to 15.5 with a TTS balloon dilator.  The upper GI series, reviewed today, showed the moderate sliding hiatal hernia and esophageal dysmotility with esophageal stasis. She was started on omeprazole 40 mg daily and advised to open the capsule to aid with administration. She was given Carafate but had a hard time making this into a slurry. She's been breaking apart and putting it with applesauce. She is using MiraLAX intermittently  Today she reports symptoms are dramatically better. She's had much less issues with acid reflux. Occasionally swallowing, particularly dry swallows, feel like they don't always "want to go down". She's had less dysphagia. No regurgitation and less belching. Epigastric pain has resolved. Bowel movements a been better though she has been inconsistent with MiraLAX. She's had no blood in her stool or melena. She's recently started exercising by walking at her church.   Review of Systems As per history of present illness, otherwise negative  Current Medications, Allergies, Past Medical History, Past Surgical History, Family History and Social History were reviewed in [employer] record.     Objective:   Physical Exam BP 114/72   Pulse 80   Ht 5' 2.5" (1.588 m)   Wt 159 lb 6 oz  (72.3 kg)   BMI 28.69 kg/m  Constitutional: Well-developed and well-nourished. No distress. HEENT: Normocephalic and atraumatic. Conjunctivae are normal.  No scleral icterus. Neck: Neck supple. Trachea midline. Cardiovascular: Normal rate, regular rhythm and intact distal pulses. No M/R/G Pulmonary/chest: Effort normal and breath sounds normal. No wheezing, rales or rhonchi. Abdominal: Soft, nontender, nondistended. Bowel sounds active throughout.  Extremities: no clubbing, cyanosis, or edema Neurological: Alert and oriented to person place and time. Skin: Skin is warm and dry.  Psychiatric: Normal mood and affect. Behavior is normal.  CBC    Component Value Date/Time   WBC 6.4 11/06/2015 1459   RBC 4.21 11/06/2015 1459   HGB 12.3 11/06/2015 1459   HCT 37.6 11/06/2015 1459   PLT 236 11/06/2015 1459   MCV 89.3 11/06/2015 1459   MCH 29.2 11/06/2015 1459   MCHC 32.7 11/06/2015 1459   RDW 14.7 11/06/2015 1459   LYMPHSABS 1,536 11/06/2015 1459   MONOABS 576 11/06/2015 1459   EOSABS 320 11/06/2015 1459   BASOSABS 64 11/06/2015 1459   CMP     Component Value Date/Time   NA 138 11/06/2015 1459   K 4.5 11/06/2015 1459   CL 104 11/06/2015 1459   CO2 25 11/06/2015 1459   GLUCOSE 90 11/06/2015 1459   BUN 17 11/06/2015 1459   CREATININE 0.75 11/06/2015 1459   CALCIUM 9.2 11/06/2015 1459   PROT 6.4 11/06/2015 1459   ALBUMIN 3.8 11/06/2015 1459   AST 15 11/06/2015 1459   ALT 11 11/06/2015 1459   ALKPHOS 65 11/06/2015 1459   BILITOT 0.3 11/06/2015 1459   GFRNONAA 75 11/06/2015  [place] 11/06/2015 1459     UPPER GI SERIES WITH KUB   TECHNIQUE: After obtaining a scout radiograph a routine upper GI series was performed using thin and high density barium.   FLUOROSCOPY TIME:  Radiation Exposure Index (as provided by the fluoroscopic device): 48.16 mGy   COMPARISON:  None.   FINDINGS: Scout radiograph: No evidence of dilated bowel loops. Large colonic stool burden  noted.   Pharynx: No abnormality seen. No evidence of vestibular penetration or aspiration during swallowing.   Esophagus: No evidence of esophageal mass or stricture. Esophageal dysmotility is seen with intermittent tertiary contractions in the distal thoracic esophagus, causing stasis of contrast within the esophagus. No definite gastroesophageal reflux of contrast seen.   Stomach: A moderate size sliding hiatal hernia is seen. No gastric masses or ulcers identified. Prompt gastric emptying is noted.   Duodenum: No ulcer or other significant abnormality seen involving duodenal bulb or sweep.   Other:  None.   IMPRESSION: Moderate size sliding hiatal hernia.   Esophageal dysmotility with intermittent tertiary contractions in distal thoracic esophagus. This causes esophageal stasis. No evidence of esophageal mass or stricture.     Electronically Signed   By: Earle Gell M.D.   On: 10/25/2015 11:50      Assessment & Plan:   82 year old female with history of GERD, hiatal hernia, IBS, severe diverticulosis, constipation solitary rectal ulcer who is here for follow-up.   1. GERD/esophageal dysmotility/large hiatal hernia with Schatzki's ring/Cameron's erosion -- her upper GI symptoms have improved dramatically with the addition of omeprazole daily. She also benefited from esophageal dilatation. I doubt Carafate is helping much and I will discontinue it. I recommended she continue omeprazole 40 mg daily and take this by opening the capsule with applesauce 30 minutes before her first meal. We discussed esophageal dysmotility, likely exacerbated and worsened by reflux. She will continue to remain upright after eating, take small bites and chew her food well and avoid eating late at night.  2. Constipation with history of solitary rectal ulcer -- I recommended she use MiraLAX 17 g on Monday Wednesday and Friday or half dose daily. When she does use this medication it does help. I think  she will feel overall better by preventing her constipation.  She can follow-up in 6-12 months, sooner if necessary 25 minutes spent with the patient today. Greater than 50% was spent in counseling and coordination of care with the patient

## 2015-12-28 NOTE — Patient Instructions (Signed)
Continue Omeprazole 40 mg- you can open capsule Stop taking carafate Use miralax 1 full dose on Monday, Wednesday and Friday or 1/2 dose daily. Follow up in 6 months.

## 2015-12-30 LAB — URINE CULTURE

## 2015-12-31 MED ORDER — CIPROFLOXACIN HCL 500 MG PO TABS: 500.0000 mg | ORAL_TABLET | Freq: Two times a day (BID) | ORAL | 0 refills | Status: AC

## 2015-12-31 NOTE — Addendum Note (Signed)
Addended by: Beatrice Lecher D on: 12/31/2015 09:22 PM   Modules accepted: Orders

## 2016-01-08 ENCOUNTER — Other Ambulatory Visit: Payer: Self-pay | Admitting: Pulmonary Disease

## 2016-01-11 NOTE — Telephone Encounter (Signed)
Refill request received from pharmacy for Beth [name].  SN - Please advise. Thanks!   Allergies  Allergen Reactions  . Doxycycline Other (See Comments)    Blisters on mouth and lips  . Pravachol [Pravastatin]     Fatigued.   . Trazodone And Nefazodone Other (See Comments)    Made her hyper  . Zocor [Simvastatin] Other (See Comments)    Myalgias and memory loss    Current Outpatient Prescriptions on File Prior to Visit  Medication Sig Dispense Refill  . albuterol (PROVENTIL HFA;VENTOLIN HFA) 108 (90 Base) MCG/ACT inhaler Inhale 2 puffs into the lungs every 6 (six) hours as needed. 1 Inhaler 2  . azelastine (ASTELIN) 0.1 % nasal spray Place 2 sprays into both nostrils 2 (two) times daily. Use in each nostril as directed 30 mL 6  . calcium carbonate 1250 MG capsule Take 1,250 mg by mouth daily.     . cephALEXin (KEFLEX) 500 MG capsule Take 1 capsule (500 mg total) by mouth 3 (three) times daily. 15 capsule 0  . Cholecalciferol (VITAMIN D3) 1000 UNITS tablet Take 1,000 Units by mouth daily.      . clonazePAM (KLONOPIN) 0.5 MG tablet TAKE ONE TABLET BY MOUTH TWICE DAILY (Patient taking differently: TAKE ONE TABLET BY MOUTH once daily) 60 tablet 0  . ibuprofen (ADVIL,MOTRIN) 200 MG tablet Take 200 mg by mouth every 6 (six) hours as needed.    . metoprolol succinate (TOPROL-XL) 25 MG 24 hr tablet Take 1 tablet (25 mg total) by mouth daily. 30 tablet 6  . Multiple Vitamin (MULTIVITAMIN) tablet Take 1 tablet by mouth daily.      . Multiple Vitamins-Minerals (PRESERVISION/LUTEIN) CAPS Take 1 capsule by mouth daily.     Marland Kitchen omeprazole (PRILOSEC) 40 MG capsule Take 1 capsule (40 mg total) by mouth daily. Open 1 capsule, sprinkle on applesauce and take once daily by mouth. Wait 30 minutes before eating meal. 30 capsule 2  . Probiotic Product (PROBIOTIC DAILY PO) Take 1 capsule by mouth daily.    . Pyridoxine HCl (VITAMIN B-6) 500 MG tablet Take 500 mg by mouth daily.      . sertraline (ZOLOFT) 50 MG  tablet Take 1 tablet (50 mg total) by mouth daily. 90 tablet 1  . vitamin B-12 (CYANOCOBALAMIN) 1000 MCG tablet Take 1,000 mcg by mouth daily.      . [DISCONTINUED] fexofenadine (ALLEGRA) 180 MG tablet Take 180 mg by mouth daily.       No current facility-administered medications on file prior to visit.

## 2016-01-14 NOTE — Telephone Encounter (Signed)
OK to refill per SN.  Rx called in to pharmacy. Nothing further needed.

## 2016-02-03 ENCOUNTER — Other Ambulatory Visit: Payer: Self-pay | Admitting: Family Medicine

## 2016-02-12 ENCOUNTER — Other Ambulatory Visit: Payer: Self-pay | Admitting: Pulmonary Disease

## 2016-02-14 ENCOUNTER — Other Ambulatory Visit: Payer: Self-pay | Admitting: Pulmonary Disease

## 2016-03-02 ENCOUNTER — Other Ambulatory Visit: Payer: Self-pay | Admitting: Internal Medicine

## 2016-04-09 ENCOUNTER — Ambulatory Visit (INDEPENDENT_AMBULATORY_CARE_PROVIDER_SITE_OTHER): Payer: Medicare Other

## 2016-04-09 ENCOUNTER — Ambulatory Visit (INDEPENDENT_AMBULATORY_CARE_PROVIDER_SITE_OTHER): Payer: Medicare Other | Admitting: Family Medicine

## 2016-04-09 ENCOUNTER — Encounter: Payer: Self-pay | Admitting: Family Medicine

## 2016-04-09 VITALS — BP 159/96 | HR 87 | Ht 63.0 in | Wt 159.0 lb

## 2016-04-09 DIAGNOSIS — M810 Age-related osteoporosis without current pathological fracture: Secondary | ICD-10-CM

## 2016-04-09 DIAGNOSIS — B372 Candidiasis of skin and nail: Secondary | ICD-10-CM | POA: Diagnosis not present

## 2016-04-09 DIAGNOSIS — L308 Other specified dermatitis: Secondary | ICD-10-CM | POA: Diagnosis not present

## 2016-04-09 DIAGNOSIS — R0789 Other chest pain: Secondary | ICD-10-CM

## 2016-04-09 MED ORDER — NYSTATIN 100000 UNIT/GM EX CREA: 1.0000 "application " | TOPICAL_CREAM | Freq: Two times a day (BID) | CUTANEOUS | 0 refills | Status: DC

## 2016-04-09 NOTE — Patient Instructions (Signed)
can use her home hydrocortisone cream on the area on her left upper shoulder for the next couple weeks to get relief.

## 2016-04-09 NOTE — Progress Notes (Signed)
Subjective:    CC: Rash  HPI:  She Complains of rash in the groin area bilaterally. It's been there for quite some time. She says she's tried multiple over-the-counter agents for several weeks and it just seems to be getting worse particularly over the last 2 weeks. She has been using an over-the-counter 2% hydrocortisone cream on the groin area that feels like it's really not helping. The rash is worse on the right compared to the left. She says it's starting to get really uncomfortable. She says she sweats a lot between the skin when she sleeps at night. In fact she's actually quit wearing underwear to try to dry it out.  Also complains of some left-sided chest pain just lateral to her breast area. She did break the clavicle on that side. Pain bothers her most in the morning when she first wakes up. She says in fact some mornings it actually wakes her up. She tends to sleep on that side so the breast is a little pushed to the side. She does sleep without a bra. She says the pain is been going on for well over 2 weeks and is not improving, in fact she feels like it's getting a little worse. She denies any injury or trauma to the chest. She is very physically active and does all of her own housecleaning etc. The pain is not worse with cough or deep breath.  He also has an itchy patch on her left upper shoulder that she would like me to look at today. She's been scratching at it for a couple of weeks. She's not been using any creams or ointments on it.  Past medical history, Surgical history, Family history not pertinant except as noted below, Social history, Allergies, and medications have been entered into the medical record, reviewed, and corrections made.   Review of Systems: No fevers, chills, night sweats, weight loss, or shortness of breath.   Objective:    General: Well Developed, well nourished, and in no acute distress.  Neuro: Alert and oriented x3, extra-ocular muscles intact, sensation  grossly intact.  HEENT: Normocephalic, atraumatic  Skin: Warm and dry, no rashes.Does have a couple of excoriated papules on her left upper back. No distinct rash there. These are clearly from scratching. In the groin creases she has an erythematous well demarcated rash with satellite lesions. Cardiac: Regular rate and rhythm, no murmurs rubs or gallops, no lower extremity edema. Chest wall is very tender just on the lateral portion of the breast. No significant pain with light touch but she is very uncomfortable with deep palpation. No distinct nodules or masses of the chest wall. Respiratory: Clear to auscultation bilaterally. Not using accessory muscles, speaking in full sentences.   Impression and Recommendations:   Candidiasis of skin-we'll treat with topical nystatin cream. Call if not improving over the next week.  Eczema-can use her home hydrocortisone cream on the area on her left upper shoulder for the next couple weeks to get relief.  Left chest wall pain-discomfort is very reproducible with deep palpation of the chest wall. Will get x-ray just to rule out rib fracture etc. Consider mammogram if her pain is not improving. Symptoms are very unlikely to be cardiac. Though I did offer to do an EKG today and she declined.

## 2016-05-02 ENCOUNTER — Ambulatory Visit (INDEPENDENT_AMBULATORY_CARE_PROVIDER_SITE_OTHER): Payer: Medicare Other | Admitting: Family Medicine

## 2016-05-02 VITALS — BP 131/80 | HR 69 | Temp 97.3°F

## 2016-05-02 DIAGNOSIS — R300 Dysuria: Secondary | ICD-10-CM | POA: Diagnosis not present

## 2016-05-02 DIAGNOSIS — N3000 Acute cystitis without hematuria: Secondary | ICD-10-CM

## 2016-05-02 LAB — POCT URINALYSIS DIPSTICK
BILIRUBIN UA: NEGATIVE
Glucose, UA: NEGATIVE
KETONES UA: NEGATIVE
Nitrite, UA: POSITIVE
PROTEIN UA: 30
SPEC GRAV UA: 1.02
Urobilinogen, UA: 0.2
pH, UA: 6.5

## 2016-05-02 MED ORDER — CIPROFLOXACIN HCL 500 MG PO TABS: 500.0000 mg | ORAL_TABLET | Freq: Two times a day (BID) | ORAL | 0 refills | Status: AC

## 2016-05-02 NOTE — Progress Notes (Signed)
   Subjective:    Patient ID: Beth [name], female    DOB: 06-Dec-1932, 83 y.o.   MRN: [id number]  HPI    Review of Systems     Objective:   Physical Exam        Assessment & Plan:  Urinary tract infection-last treated in August with Keflex area will send her prescription for Cipro this time. Follow-up if not better in one week. Increase water intake.  Beatrice Lecher, MD

## 2016-05-02 NOTE — Progress Notes (Signed)
Patient came into clinic today complaining of dysuria, flank pain, and urine odor for the past 5 days. Pt denies any fever or abdominal pain. Pt states she gets "frequent UTI's" with the same symptoms. POC urine dipstick and urine culture ordered today. Results given to PCP for review.

## 2016-05-05 LAB — URINE CULTURE

## 2016-05-07 ENCOUNTER — Other Ambulatory Visit: Payer: Self-pay | Admitting: Family Medicine

## 2016-05-09 ENCOUNTER — Telehealth: Payer: Self-pay | Admitting: *Deleted

## 2016-05-09 DIAGNOSIS — Z87440 Personal history of urinary (tract) infections: Secondary | ICD-10-CM

## 2016-05-09 MED ORDER — NITROFURANTOIN MONOHYD MACRO 100 MG PO CAPS: 100.0000 mg | ORAL_CAPSULE | Freq: Two times a day (BID) | ORAL | 0 refills | Status: DC

## 2016-05-09 NOTE — Telephone Encounter (Signed)
Referral placed.Beth [name] [place]  

## 2016-05-09 NOTE — Telephone Encounter (Signed)
Pt called and stated that her UTI sxs are back. She finished her ABX on yesterday.   She denies fever, she reports burning, frequency.  Pt has an appt and will not be at home, and she stated that she has a funeral to go to over the weekend.   Will fwd to [employer] for advice.Audelia Hives [place]

## 2016-05-09 NOTE — Telephone Encounter (Signed)
Called and informed pt of recommendations. She informed me that she received a letter that it is time for her to f/u with the urologist. I told her that she should call their office and schedule an appt. She voiced understanding and agreed.Beth [name]

## 2016-05-09 NOTE — Telephone Encounter (Signed)
Ok for macrobid 100mg  bid for 7 days. #14 NRF. If continues to have symptoms follow up with PCP.

## 2016-07-17 ENCOUNTER — Encounter: Payer: Self-pay | Admitting: Family Medicine

## 2016-07-17 ENCOUNTER — Ambulatory Visit (INDEPENDENT_AMBULATORY_CARE_PROVIDER_SITE_OTHER): Payer: Medicare Other | Admitting: Family Medicine

## 2016-07-17 VITALS — BP 138/65 | HR 73 | Ht 63.0 in | Wt 163.0 lb

## 2016-07-17 DIAGNOSIS — M545 Low back pain: Secondary | ICD-10-CM | POA: Diagnosis not present

## 2016-07-17 DIAGNOSIS — Z23 Encounter for immunization: Secondary | ICD-10-CM

## 2016-07-17 LAB — POCT URINALYSIS DIPSTICK
Bilirubin, UA: NEGATIVE
Blood, UA: NEGATIVE
GLUCOSE UA: NEGATIVE
Ketones, UA: NEGATIVE
NITRITE UA: NEGATIVE
Protein, UA: NEGATIVE
Spec Grav, UA: 1.01 (ref 1.030–1.035)
UROBILINOGEN UA: 0.2 (ref ?–2.0)
pH, UA: 5.5 (ref 5.0–8.0)

## 2016-07-17 MED ORDER — ALBUTEROL SULFATE HFA 108 (90 BASE) MCG/ACT IN AERS: 2.0000 | INHALATION_SPRAY | Freq: Four times a day (QID) | RESPIRATORY_TRACT | 2 refills | Status: DC | PRN

## 2016-07-17 MED ORDER — PREDNISONE 20 MG PO TABS: 40.0000 mg | ORAL_TABLET | Freq: Every day | ORAL | 0 refills | Status: DC

## 2016-07-17 MED ORDER — CYCLOBENZAPRINE HCL 10 MG PO TABS: 10.0000 mg | ORAL_TABLET | Freq: Every evening | ORAL | 0 refills | Status: DC | PRN

## 2016-07-17 NOTE — Progress Notes (Signed)
Subjective:    Patient ID: Beth [name], female    DOB: [birth year], 83 y.o.   MRN: [id number]  HPI 83 year old female with a history of reactive airway disease comes in today complaining of Lateral back pain around the mid thoracic area down to the low back. She says at times it's actually been worse on the right side when she is sleeping on that side. She denies any known injury or trauma. She has had a slight cough recently with some postnasal drip and says started taking some over-the-counter allergy medication. She said yesterday it was so painful that she actually thought that it could be pneumonia as she felt similar to this in the past. That she denies any fevers chills or sweats or shortness of breath. She says she's also having pain from the lateral rib area down to her hip on the right side. No wheezing. No known trauma. No urinary symptoms. Using heated rice pack and feeling some better.    Review of Systems   BP 138/65   Pulse 73   Ht 5\' 3"  (1.6 m)   Wt 163 lb (73.9 kg)   SpO2 96%   BMI 28.87 kg/m     Allergies  Allergen Reactions  . Doxycycline Other (See Comments)    Blisters on mouth and lips  . Pravachol [Pravastatin]     Fatigued.   . Trazodone And Nefazodone Other (See Comments)    Made her hyper  . Zocor [Simvastatin] Other (See Comments)    Myalgias and memory loss    Past Medical History:  Diagnosis Date  . Allergy   . Anxiety    situational  . Arthritis   . Asthma   . Cataract   . COPD (chronic obstructive pulmonary disease) ([facility]CC)    PER PT  . Diverticulitis   . Diverticulitis   . Emphysema of lung ([place])   . Esophageal dysmotility   . Fibromyalgia   . Hiatal hernia   . Hyperlipidemia   . Hypertension   . Insomnia   . Osteopenia   . UTI (lower urinary tract infection)     Past Surgical History:  Procedure Laterality Date  . ABDOMINAL HYSTERECTOMY    . BLADDER SURGERY     tac  . COLONOSCOPY     2008    Social History   Social  History  . Marital status: Widowed    Spouse name: N/A  . Number of children: 2  . Years of education: N/A   Occupational History  . retired    Social History Main Topics  . Smoking status: Former Research scientist (life sciences)  . Smokeless tobacco: Never Used     Comment: smoker as a teenager  . Alcohol use No  . Drug use: No  . Sexual activity: Not on file   Other Topics Concern  . Not on file   Social History Narrative  . No narrative on file    Family History  Problem Relation Age of Onset  . Cancer Mother     breast  . Hyperlipidemia Mother   . Hypertension Mother   . Dementia Mother   . Heart disease Father     MI  . Depression Father   . Hyperlipidemia Father   . Colon cancer Neg Hx   . Esophageal cancer Neg Hx   . Stomach cancer Neg Hx   . Rectal cancer Neg Hx     Outpatient Encounter Prescriptions as of 07/17/2016  Medication Sig  . albuterol (  PROVENTIL HFA;VENTOLIN HFA) 108 (90 Base) MCG/ACT inhaler Inhale 2 puffs into the lungs every 6 (six) hours as needed.  Marland Kitchen azelastine (ASTELIN) 0.1 % nasal spray Place 2 sprays into both nostrils 2 (two) times daily. Use in each nostril as directed  . calcium carbonate 1250 MG capsule Take 1,250 mg by mouth daily.   . Cholecalciferol (VITAMIN D3) 1000 UNITS tablet Take 1,000 Units by mouth daily.    . clonazePAM (KLONOPIN) 0.5 MG tablet TAKE ONE TABLET BY MOUTH TWICE DAILY  . ibuprofen (ADVIL,MOTRIN) 200 MG tablet Take 200 mg by mouth every 6 (six) hours as needed.  . metoprolol succinate (TOPROL-XL) 25 MG 24 hr tablet TAKE ONE TABLET BY MOUTH ONCE DAILY  . Multiple Vitamin (MULTIVITAMIN) tablet Take 1 tablet by mouth daily.    . Multiple Vitamins-Minerals (PRESERVISION/LUTEIN) CAPS Take 1 capsule by mouth daily.   Marland Kitchen omeprazole (PRILOSEC) 40 MG capsule OPEN ONE CAPSULE AND SPRINKLE ON APPLESAUCE AND TAKE BY MOUTH ONCE DAILY. WAIT 30 MINUTES BEFORE EATING MEAL.  Marland Kitchen Pyridoxine HCl (VITAMIN B-6) 500 MG tablet Take 500 mg by mouth daily.    .  sertraline (ZOLOFT) 50 MG tablet TAKE ONE TABLET BY MOUTH ONCE DAILY  . vitamin B-12 (CYANOCOBALAMIN) 1000 MCG tablet Take 1,000 mcg by mouth daily.    . [DISCONTINUED] albuterol (PROVENTIL HFA;VENTOLIN HFA) 108 (90 Base) MCG/ACT inhaler Inhale 2 puffs into the lungs every 6 (six) hours as needed.  . [DISCONTINUED] nitrofurantoin, macrocrystal-monohydrate, (MACROBID) 100 MG capsule Take 1 capsule (100 mg total) by mouth 2 (two) times daily.  . [DISCONTINUED] nystatin cream (MYCOSTATIN) Apply 1 application topically 2 (two) times daily. X 3 weeks to groin creases  . [DISCONTINUED] Probiotic Product (PROBIOTIC DAILY PO) Take 1 capsule by mouth daily.   No facility-administered encounter medications on file as of 07/17/2016.           Objective:   Physical Exam  Constitutional: She is oriented to person, place, and time. She appears well-developed and well-nourished.  HENT:  Head: Normocephalic and atraumatic.  Cardiovascular: Normal rate, regular rhythm and normal heart sounds.   Pulmonary/Chest: Effort normal and breath sounds normal.  Musculoskeletal:  Normal lumbar flexion, extension, rotation right and left with normal side bending. She did have a little bit of discomfort with rotation. Negative straight leg raise bilaterally. Hip, knee, ankle strength 5 out of 5 bilaterally. Patellar reflexes 2+ bilaterally.  She is tender on the left mid back area just lateal to the spine.    Neurological: She is alert and oriented to person, place, and time.  Skin: Skin is warm and dry.  Psychiatric: She has a normal mood and affect. Her behavior is normal.       Assessment & Plan:  Bilateral back pain-I think it's more musculoskeletal as unable to find a tender areas on exam today. Recommend a trial of prednisone for 5 days and a muscle relaxer at bedtime as well as stretches. We did go ahead do a urinalysis today. If not improving then consider x-rays for further evaluation. Lungs are completely  clear.  Urinalysis - shows some trace leuks.

## 2016-07-24 ENCOUNTER — Telehealth: Payer: Self-pay | Admitting: Internal Medicine

## 2016-07-24 NOTE — Telephone Encounter (Signed)
Patient of Dr. Hilarie Fredrickson, she has been having back pain and right sided abdominal pain since 3/22. She admits to being constipated but did have some relief from that today. Takes Miralax prn, but has not had to have any in a month. She has a very poor appetite, has been trying to stay hydrated. States that she took Tums and that helped some, has been taking her omeprazole daily. She did see her PCP on 3/22.

## 2016-07-24 NOTE — Telephone Encounter (Signed)
Patient advised that she can try increasing her omeprazole to twice daily to see if this helps. Patient was concerned about how much pain she was having currently. I told her that Dr. Havery Moros said that if she didn't think she could wait to see if the medication increase would help, she would need to go to the ER to be evaluated. Patient did not say which option she wanted to do, but her son was there so I feel that they will go to the ER. I will put a note in for Dr. Vena Rua nurse to check on patient on Monday.

## 2016-07-24 NOTE — Telephone Encounter (Signed)
I'm just seeing this now (445PM). If she is in discomfort and can't tolerate much PO over the weekend she will need go to the ER for further evaluation, get labs and possible imaging. Otherwise, if she is stable we can try to see her in the clinic next week although we are heading into the 3 day holiday weekend. She can increase omeprazole to twice daily in the interim to see if this helps.

## 2016-07-25 ENCOUNTER — Emergency Department (HOSPITAL_COMMUNITY)
Admission: EM | Admit: 2016-07-25 | Discharge: 2016-07-25 | Discharge status: Against Medical Advice | Payer: Medicare Other | Attending: Emergency Medicine | Admitting: Emergency Medicine

## 2016-07-25 ENCOUNTER — Encounter (HOSPITAL_COMMUNITY): Payer: Self-pay | Admitting: Emergency Medicine

## 2016-07-25 DIAGNOSIS — J449 Chronic obstructive pulmonary disease, unspecified: Secondary | ICD-10-CM | POA: Diagnosis not present

## 2016-07-25 DIAGNOSIS — R1011 Right upper quadrant pain: Secondary | ICD-10-CM | POA: Insufficient documentation

## 2016-07-25 DIAGNOSIS — R1012 Left upper quadrant pain: Secondary | ICD-10-CM

## 2016-07-25 DIAGNOSIS — I10 Essential (primary) hypertension: Secondary | ICD-10-CM | POA: Diagnosis not present

## 2016-07-25 DIAGNOSIS — Z87891 Personal history of nicotine dependence: Secondary | ICD-10-CM | POA: Diagnosis not present

## 2016-07-25 DIAGNOSIS — Z5329 Procedure and treatment not carried out because of patient's decision for other reasons: Secondary | ICD-10-CM

## 2016-07-25 DIAGNOSIS — Z5320 Procedure and treatment not carried out because of patient's decision for unspecified reasons: Secondary | ICD-10-CM

## 2016-07-25 LAB — COMPREHENSIVE METABOLIC PANEL
ALT: 16 U/L (ref 14–54)
AST: 20 U/L (ref 15–41)
Albumin: 4.2 g/dL (ref 3.5–5.0)
Alkaline Phosphatase: 57 U/L (ref 38–126)
Anion gap: 8 (ref 5–15)
BUN: 13 mg/dL (ref 6–20)
CO2: 30 mmol/L (ref 22–32)
Calcium: 9.1 mg/dL (ref 8.9–10.3)
Chloride: 98 mmol/L — ABNORMAL LOW (ref 101–111)
Creatinine, Ser: 0.9 mg/dL (ref 0.44–1.00)
GFR calc Af Amer: >60 mL/min (ref >60)
GFR calc non Af Amer: 58 mL/min — ABNORMAL LOW (ref >60)
Glucose, Bld: 98 mg/dL (ref 65–99)
Potassium: 3.8 mmol/L (ref 3.5–5.1)
Sodium: 136 mmol/L (ref 135–145)
Total Bilirubin: 0.7 mg/dL (ref 0.3–1.2)
Total Protein: 7.1 g/dL (ref 6.5–8.1)

## 2016-07-25 LAB — CBC
HCT: 40.3 % (ref 36.0–46.0)
Hemoglobin: 13.6 g/dL (ref 12.0–15.0)
MCH: 30.2 pg (ref 26.0–34.0)
MCHC: 33.7 g/dL (ref 30.0–36.0)
MCV: 89.4 fL (ref 78.0–100.0)
Platelets: 241 10*3/uL (ref 150–400)
RBC: 4.51 MIL/uL (ref 3.87–5.11)
RDW: 14.1 % (ref 11.5–15.5)
WBC: 9.3 10*3/uL (ref 4.0–10.5)

## 2016-07-25 LAB — LIPASE, BLOOD: Lipase: 19 U/L (ref 11–51)

## 2016-07-25 NOTE — ED Notes (Addendum)
RN was called to room, Pt wanted to leave AMA.  RN explained that we would like to finish the treatment plan but Pt stated that she just really wanted to go now, that it is taking too long to get everything done and they have other "things to do"  Pt refused discharge vitals.

## 2016-07-25 NOTE — ED Provider Notes (Signed)
[facility] DEPT Provider Note   CSN: [id number] Arrival date & time: 07/25/16  1505     History   Chief Complaint Chief Complaint  Patient presents with  . Abdominal Pain    HPI Beth [name] is a 83 y.o. female.  Is a patient of Dr. Hilarie Fredrickson with a history Hiatal hernia, diverticulitis, GERD and fibromyalgia. She presents today with her son for evaluation of 3 days of abdominal pain. She reports that she has felt like she has been constipated and has been taking milk of magnesia. She reports that milk of magnesia was effective and that after she defecated her abdominal pain and bloating has gotten better.  She reports that she has had 3 days of 8 out of 10 right upper quadrant abdominal pain. Currently she reports only mild, intermittent abdominal pain. She reports that eating does not change the pain however reports that she has been nauseous to the point that she has only been eating "small little meals" for the past few days.  The pain is made worse by changes in position. She reports that she has doubled her Prilosec dose which has helped her pain.  Her pain is associated with nausea, belching, feelings of reflux.  She reports that her last bowel movement was yesterday and that it was "hard" and required straining to pass.  She reports decreased urination stating that she often has stress incontinence however she has "only urinated twice today."  Denies any urinary symptoms, no painful urination, foul odors, increased frequency or urgency.  When asked about feeling weak she reports that "I am not back to my baseline yet."      Past Medical History:  Diagnosis Date  . Allergy   . Anxiety    situational  . Arthritis   . Asthma   . Cataract   . COPD (chronic obstructive pulmonary disease) (HCC)    PER PT  . Diverticulitis   . Diverticulitis   . Emphysema of lung ([place])   . Esophageal dysmotility   . Fibromyalgia   . Hiatal hernia   . Hyperlipidemia   . Hypertension   .  Insomnia   . Osteopenia   . UTI (lower urinary tract infection)     Patient Active Problem List   Diagnosis Date Noted  . Dysuria 12/27/2015  . Cardiomegaly 11/06/2015  . Bloating 07/20/2015  . History of recurrent UTIs 05/29/2015  . Reactive airway disease 10/23/2014  . Laryngopharyngeal reflux (LPR) 10/23/2014  . Headache(784.0) 01/17/2014  . Generalized anxiety disorder 01/16/2014  . Insomnia 01/16/2014  . Lactose intolerance 09/06/2013  . COPD (chronic obstructive pulmonary disease) ([place]) 11/08/2012  . Ataxia 03/16/2011  . HYPERLIPIDEMIA 11/13/2010  . Reaction, situational, acute, to stress 09/09/2010  . MURMUR 05/11/2009  . URINARY URGENCY 05/11/2009  . Diaphragmatic hernia 03/14/2009  . MACULAR DEGENERATION, RIGHT EYE 12/06/2008  . HYPERTENSION, BENIGN 12/06/2008  . DIVERTICULITIS, COLON 12/06/2008  . FIBROMYALGIA 12/06/2008  . OSTEOPENIA 12/06/2008    Past Surgical History:  Procedure Laterality Date  . ABDOMINAL HYSTERECTOMY    . BLADDER SURGERY     tac  . COLONOSCOPY     2008    OB History    No data available       Home Medications    Prior to Admission medications   Medication Sig Start Date End Date Taking? Authorizing Provider  albuterol (PROVENTIL HFA;VENTOLIN HFA) 108 (90 Base) MCG/ACT inhaler Inhale 2 puffs into the lungs every 6 (six) hours as needed. 07/17/16  Hali Marry, MD  azelastine (ASTELIN) 0.1 % nasal spray Place 2 sprays into both nostrils 2 (two) times daily. Use in each nostril as directed 12/13/15   Noralee Space, MD  calcium carbonate 1250 MG capsule Take 1,250 mg by mouth daily.     Historical Provider, MD  Cholecalciferol (VITAMIN D3) 1000 UNITS tablet Take 1,000 Units by mouth daily.      Historical Provider, MD  clonazePAM (KLONOPIN) 0.5 MG tablet TAKE ONE TABLET BY MOUTH TWICE DAILY 02/20/16   Noralee Space, MD  cyclobenzaprine (FLEXERIL) 10 MG tablet Take 1 tablet (10 mg total) by mouth at bedtime as needed for muscle  spasms. 07/17/16   Hali Marry, MD  ibuprofen (ADVIL,MOTRIN) 200 MG tablet Take 200 mg by mouth every 6 (six) hours as needed.    Historical Provider, MD  metoprolol succinate (TOPROL-XL) 25 MG 24 hr tablet TAKE ONE TABLET BY MOUTH ONCE DAILY 05/07/16   Hali Marry, MD  Multiple Vitamin (MULTIVITAMIN) tablet Take 1 tablet by mouth daily.      Historical Provider, MD  Multiple Vitamins-Minerals (PRESERVISION/LUTEIN) CAPS Take 1 capsule by mouth daily.     Historical Provider, MD  omeprazole (PRILOSEC) 40 MG capsule OPEN ONE CAPSULE AND SPRINKLE ON APPLESAUCE AND TAKE BY MOUTH ONCE DAILY. WAIT 30 MINUTES BEFORE EATING MEAL. 03/03/16   Jerene Bears, MD  predniSONE (DELTASONE) 20 MG tablet Take 2 tablets (40 mg total) by mouth daily. 07/17/16   Hali Marry, MD  Pyridoxine HCl (VITAMIN B-6) 500 MG tablet Take 500 mg by mouth daily.      Historical Provider, MD  sertraline (ZOLOFT) 50 MG tablet TAKE ONE TABLET BY MOUTH ONCE DAILY 02/04/16   Hali Marry, MD  vitamin B-12 (CYANOCOBALAMIN) 1000 MCG tablet Take 1,000 mcg by mouth daily.      Historical Provider, MD    Family History Family History  Problem Relation Age of Onset  . Cancer Mother     breast  . Hyperlipidemia Mother   . Hypertension Mother   . Dementia Mother   . Heart disease Father     MI  . Depression Father   . Hyperlipidemia Father   . Colon cancer Neg Hx   . Esophageal cancer Neg Hx   . Stomach cancer Neg Hx   . Rectal cancer Neg Hx     Social History Social History  Substance Use Topics  . Smoking status: Former Research scientist (life sciences)  . Smokeless tobacco: Never Used     Comment: smoker as a teenager  . Alcohol use No     Allergies   Doxycycline; Pravachol [pravastatin]; Trazodone and nefazodone; and Zocor [simvastatin]   Review of Systems Review of Systems  Constitutional: Negative for chills and fever.  HENT: Negative for ear pain and sore throat.   Eyes: Negative for pain and visual  disturbance.  Respiratory: Negative for cough and shortness of breath.   Cardiovascular: Negative for chest pain and palpitations.  Gastrointestinal: Positive for abdominal distention, abdominal pain, constipation and nausea. Negative for anal bleeding, blood in stool, diarrhea, rectal pain and vomiting.  Genitourinary: Positive for decreased urine volume. Negative for dysuria, flank pain, frequency, hematuria and urgency.  Musculoskeletal: Negative for arthralgias and back pain.  Skin: Negative for color change and rash.  Neurological: Positive for weakness. Negative for seizures, syncope, light-headedness and headaches.  All other systems reviewed and are negative.    Physical Exam Updated Vital Signs BP 133/72 (BP Location:  Left Arm)   Pulse 68   Temp 97.5 F (36.4 C) (Oral)   Resp 18   Wt 72.6 kg   SpO2 97%   BMI 28.34 kg/m   Physical Exam  Constitutional: She appears well-developed and well-nourished.  Non-toxic appearance. She does not have a sickly appearance. She does not appear ill. No distress.  HENT:  Head: Normocephalic and atraumatic.  Eyes: Conjunctivae are normal. Right eye exhibits no discharge. Left eye exhibits no discharge. No scleral icterus.  Neck: Neck supple.  Cardiovascular: Normal rate, regular rhythm and normal heart sounds.  Exam reveals no gallop and no friction rub.   No murmur heard. Pulmonary/Chest: Effort normal and breath sounds normal. No respiratory distress.  Abdominal: Soft. Normal appearance and bowel sounds are normal. She exhibits no distension, no ascites and no mass. There is no hepatosplenomegaly. There is tenderness in the right upper quadrant and epigastric area. There is no rigidity, no rebound, no guarding, no CVA tenderness, no tenderness at McBurney's point and negative Murphy's sign.    Musculoskeletal: She exhibits no edema.  Neurological: She is alert. She exhibits normal muscle tone.  Skin: Skin is warm and dry. She is not  diaphoretic.  Psychiatric: She has a normal mood and affect.  Nursing note and vitals reviewed.    ED Treatments / Results  Labs (all labs ordered are listed, but only abnormal results are displayed) Labs Reviewed  COMPREHENSIVE METABOLIC PANEL - Abnormal; Notable for the following:       Result Value   Chloride 98 (*)    GFR calc non Af Amer 58 (*)    All other components within normal limits  LIPASE, BLOOD  CBC  URINALYSIS, ROUTINE W REFLEX MICROSCOPIC    EKG  EKG Interpretation None       Radiology No results found.  Procedures Procedures (including critical care time)  Medications Ordered in ED Medications - No data to display   Initial Impression / Assessment and Plan / ED Course  I have reviewed the triage vital signs and the nursing notes.  Pertinent labs & imaging results that were available during my care of the patient were reviewed by me and considered in my medical decision making (see chart for details).    Patient is nontoxic, nonseptic appearing, in no apparent distress.  Patient's pain and other symptoms adequately managed in emergency department.  Labs, imaging and vitals reviewed.  Patient does not meet the SIRS or Sepsis criteria.  On repeat exam patient does not have a surgical abdomin and there are no peritoneal signs.  No indication of appendicitis, bowel obstruction, bowel perforation, cholecystitis,or  diverticulitis.   The patient was discussed with Dr. Wilson Singer but before he could see the patient she chose to leave AMA.  The patient and I never had a discussion about the risks of leaving AMA as she gave no indications that was her plan during our discussion.  She signed out AMA.    Final Clinical Impressions(s) / ED Diagnoses   Final diagnoses:  Left upper quadrant pain  Left against medical advice    New Prescriptions Discharge Medication List as of 07/25/2016  8:27 PM       Lorin Glass, PA-C 07/25/16 2138    Virgel Manifold,  MD 07/26/16 [id number]

## 2016-07-25 NOTE — ED Triage Notes (Signed)
Pt verbalizes bloating and generalized abdominal pain with associated nausea and constipation for 4 days.

## 2016-07-29 ENCOUNTER — Telehealth: Payer: Self-pay | Admitting: Internal Medicine

## 2016-07-29 NOTE — Telephone Encounter (Signed)
Pt reports she was having abd pain last week and was seen in the ER. States all they did was draw labs but pt left AMA prior to CT being done. Pt requesting to see Dr. Hilarie Fredrickson, offered her an appt tomorrow but pt states she has a Chiropractor and cannot come. Pt scheduled to see Dr. Hilarie Fredrickson 09/10/16@10 :30am and placed on the cancellation list. Pt aware.

## 2016-08-02 ENCOUNTER — Other Ambulatory Visit: Payer: Self-pay | Admitting: Family Medicine

## 2016-08-04 ENCOUNTER — Other Ambulatory Visit: Payer: Self-pay | Admitting: *Deleted

## 2016-08-04 MED ORDER — SERTRALINE HCL 50 MG PO TABS: 50.0000 mg | ORAL_TABLET | Freq: Every day | ORAL | 0 refills | Status: DC

## 2016-08-15 ENCOUNTER — Other Ambulatory Visit: Payer: Self-pay | Admitting: Family Medicine

## 2016-08-20 ENCOUNTER — Other Ambulatory Visit: Payer: Self-pay | Admitting: Pulmonary Disease

## 2016-09-10 ENCOUNTER — Encounter: Payer: Self-pay | Admitting: Internal Medicine

## 2016-09-10 ENCOUNTER — Encounter (INDEPENDENT_AMBULATORY_CARE_PROVIDER_SITE_OTHER): Payer: Self-pay

## 2016-09-10 ENCOUNTER — Ambulatory Visit (INDEPENDENT_AMBULATORY_CARE_PROVIDER_SITE_OTHER): Payer: Medicare Other | Admitting: Internal Medicine

## 2016-09-10 VITALS — BP 116/66 | HR 76 | Ht 62.25 in | Wt 157.2 lb

## 2016-09-10 DIAGNOSIS — K5900 Constipation, unspecified: Secondary | ICD-10-CM | POA: Diagnosis not present

## 2016-09-10 DIAGNOSIS — K449 Diaphragmatic hernia without obstruction or gangrene: Secondary | ICD-10-CM

## 2016-09-10 DIAGNOSIS — K219 Gastro-esophageal reflux disease without esophagitis: Secondary | ICD-10-CM | POA: Diagnosis not present

## 2016-09-10 MED ORDER — OMEPRAZOLE 40 MG PO CPDR: DELAYED_RELEASE_CAPSULE | ORAL | 4 refills | Status: DC

## 2016-09-10 NOTE — Patient Instructions (Signed)
We have sent the following medications to your pharmacy for you to pick up at your convenience: Omeprazole 40 mg daily  You may continue milk of magnesia every 3-4 days. You may take Miralax daily if the milk of magnesia does not seem to be working.  Please follow up with Dr Hilarie Fredrickson in 9-12 months, earlier if needed.  If you are age 81 or older, your body mass index should be between 23-30. Your Body mass index is 28.53 kg/m. If this is out of the aforementioned range listed, please consider follow up with your Primary Care Provider.  If you are age 3 or younger, your body mass index should be between 19-25. Your Body mass index is 28.53 kg/m. If this is out of the aformentioned range listed, please consider follow up with your Primary Care Provider.

## 2016-09-10 NOTE — Progress Notes (Signed)
Subjective:    Patient ID: Beth [name], female    DOB: 1933/04/12, 83 y.o.   MRN: [id number]  HPI Perrie Ragin is an 83 yo female with PMH of GERD with large HH and hx of Cameron's erosions, IBS, severe diverticulosis, constipation with hx of solitary rectal ulcer here for follow-up.  She is here alone today and was last seen in the office in Sept 2017.     She reports today she is feeling "great". She states that she has "learned how to eat" and is having considerably less reflux and upper abdominal complaint. She is eating smaller meals and chewing really well. She is avoiding spicy foods and certain trigger foods specifically chocolate. She states simply "don't overload" my stomach. She is using omeprazole 40 mg about every other day. She reports bowel movements have been mostly regular. She has been going daily but about every 3-4 days she will take a "small swallow" of milk of magnesia which helps to give her a more complete evacuation and a "good clean out". Previously we had talked about MiraLAX but she's been using this rarely if at all recently. Today she denies abdominal pain, no recent blood in stool or melena. No fevers or chills. Appetite has been good.  She was seen in the ER but left before complete workup in late March 2018. She reported left upper quadrant pain that she felt like she had a "blockage" after not having bowel movements regularly for several days. None of these symptoms have persisted. She reports "I almost forgot I even went to the ER".  Upper endoscopy and colonoscopy were performed last year which we reviewed today. EGD pertinent findings included large 7 cm hiatal hernia, Cameron's erosions and Schatzki's ring dilated to 15.5 mm. Colonoscopy pertinent findings included solitary rectal ulcer and severe diverticulosis.  Review of Systems As per HPI, otherwise negative  Current Medications, Allergies, Past Medical History, Past Surgical History, Family History and  Social History were reviewed in [employer] record.     Objective:   Physical Exam BP 116/66 (BP Location: Left Arm, Patient Position: Sitting, Cuff Size: Normal)   Pulse 76   Ht 5' 2.25" (1.581 m) Comment: height measured without shoes  Wt 157 lb 4 oz (71.3 kg)   BMI 28.53 kg/m  Constitutional: Well-developed and well-nourished. No distress. HEENT: Normocephalic and atraumatic.  No scleral icterus. Neck: Neck supple. Trachea midline. Cardiovascular: Normal rate, regular rhythm and intact distal pulses.  Pulmonary/chest: Effort normal and breath sounds normal. No wheezing, rales or rhonchi. Abdominal: Soft, obese, nontender, nondistended. Bowel sounds active throughout. There are no masses palpable. No hepatosplenomegaly. Extremities: no clubbing, cyanosis, or edema Neurological: Alert and oriented to person place and time. Skin: Skin is warm and dry. Psychiatric: Normal mood and affect. Behavior is normal.  CBC    Component Value Date/Time   WBC 9.3 07/25/2016 1536   RBC 4.51 07/25/2016 1536   HGB 13.6 07/25/2016 1536   HCT 40.3 07/25/2016 1536   PLT 241 07/25/2016 1536   MCV 89.4 07/25/2016 1536   MCH 30.2 07/25/2016 1536   MCHC 33.7 07/25/2016 1536   RDW 14.1 07/25/2016 1536   LYMPHSABS 1,536 11/06/2015 1459   MONOABS 576 11/06/2015 1459   EOSABS 320 11/06/2015 1459   BASOSABS 64 11/06/2015 1459   CMP     Component Value Date/Time   NA 136 07/25/2016 1536   K 3.8 07/25/2016 1536   CL 98 (L) 07/25/2016 1536  CO2 30 07/25/2016 1536   GLUCOSE 98 07/25/2016 1536   BUN 13 07/25/2016 1536   CREATININE 0.90 07/25/2016 1536   CREATININE 0.75 11/06/2015 1459   CALCIUM 9.1 07/25/2016 1536   PROT 7.1 07/25/2016 1536   ALBUMIN 4.2 07/25/2016 1536   AST 20 07/25/2016 1536   ALT 16 07/25/2016 1536   ALKPHOS 57 07/25/2016 1536   BILITOT 0.7 07/25/2016 1536   GFRNONAA 58 (L) 07/25/2016 1536   GFRNONAA 75 11/06/2015 1459   GFRAA >60 07/25/2016 1536    GFRAA 86 11/06/2015 1459   Lipase     Component Value Date/Time   LIPASE 19 07/25/2016 1536       Assessment & Plan:  83 yo female with PMH of GERD with large HH and hx of Cameron's erosions, IBS, severe diverticulosis, constipation with hx of solitary rectal ulcer here for follow-up.  1. GERD/large hiatal hernia with Cameron's erosions -- she has made improvement through dietary modification. I have encouraged her to use omeprazole on a daily basis rather than every other day given her history of reflux and erosive gastritis/Cameron's erosions. We again discussed her esophageal dysmotility which she is treating with dietary modification.  2. Constipation -- also history of solitary rectal ulcer. I've encouraged her to continue milk of magnesia every 3-4 days to ensure that she is having complete evacuation. Her diverticulosis likely exacerbates her constipation and incomplete evacuation. She has responded better to milk of magnesia then to MiraLAX. I asked that she use milk of magnesia every 3-4 days to avoid constipation and use MiraLAX if milk of magnesia is not effective. She is happy with this plan.  9-12 month follow-up, sooner if necessary 25 minutes spent with the patient today. Greater than 50% was spent in counseling and coordination of care with the patient

## 2016-09-17 ENCOUNTER — Other Ambulatory Visit: Payer: Self-pay | Admitting: Family Medicine

## 2016-09-28 ENCOUNTER — Emergency Department (HOSPITAL_COMMUNITY)
Admission: EM | Admit: 2016-09-28 | Discharge: 2016-09-28 | Disposition: A | Discharge status: Home / Self Care | Payer: Medicare Other | Attending: Emergency Medicine | Admitting: Emergency Medicine

## 2016-09-28 ENCOUNTER — Emergency Department (HOSPITAL_COMMUNITY): Payer: Medicare Other

## 2016-09-28 ENCOUNTER — Encounter (HOSPITAL_COMMUNITY): Payer: Self-pay | Admitting: Emergency Medicine

## 2016-09-28 DIAGNOSIS — Y9389 Activity, other specified: Secondary | ICD-10-CM | POA: Diagnosis not present

## 2016-09-28 DIAGNOSIS — Y999 Unspecified external cause status: Secondary | ICD-10-CM | POA: Insufficient documentation

## 2016-09-28 DIAGNOSIS — S52125A Nondisplaced fracture of head of left radius, initial encounter for closed fracture: Secondary | ICD-10-CM | POA: Diagnosis not present

## 2016-09-28 DIAGNOSIS — J449 Chronic obstructive pulmonary disease, unspecified: Secondary | ICD-10-CM | POA: Insufficient documentation

## 2016-09-28 DIAGNOSIS — Z87891 Personal history of nicotine dependence: Secondary | ICD-10-CM | POA: Insufficient documentation

## 2016-09-28 DIAGNOSIS — M25422 Effusion, left elbow: Secondary | ICD-10-CM | POA: Diagnosis not present

## 2016-09-28 DIAGNOSIS — M79602 Pain in left arm: Secondary | ICD-10-CM | POA: Diagnosis present

## 2016-09-28 DIAGNOSIS — Z79899 Other long term (current) drug therapy: Secondary | ICD-10-CM | POA: Insufficient documentation

## 2016-09-28 DIAGNOSIS — W0110XA Fall on same level from slipping, tripping and stumbling with subsequent striking against unspecified object, initial encounter: Secondary | ICD-10-CM | POA: Insufficient documentation

## 2016-09-28 DIAGNOSIS — I10 Essential (primary) hypertension: Secondary | ICD-10-CM | POA: Diagnosis not present

## 2016-09-28 DIAGNOSIS — Y929 Unspecified place or not applicable: Secondary | ICD-10-CM | POA: Diagnosis not present

## 2016-09-28 DIAGNOSIS — J45909 Unspecified asthma, uncomplicated: Secondary | ICD-10-CM | POA: Insufficient documentation

## 2016-09-28 MED ORDER — BACITRACIN ZINC 500 UNIT/GM EX OINT: TOPICAL_OINTMENT | Freq: Two times a day (BID) | CUTANEOUS | Status: DC

## 2016-09-28 MED ORDER — HYDROCODONE-ACETAMINOPHEN 5-325 MG PO TABS: 1.0000 | ORAL_TABLET | Freq: Three times a day (TID) | ORAL | 0 refills | Status: DC | PRN

## 2016-09-28 NOTE — ED Provider Notes (Signed)
Medical screening examination/treatment/procedure(s) were conducted as a shared visit with non-physician practitioner(s) and myself.  I personally evaluated the patient during the encounter.   EKG Interpretation None      84yF with L forearm/L elbow pain. Increased pain with pronation/supination and point tender over radial head. Closed injury. NVI. Will image. PRN pain meds. Hand FU if radial head fx.    Virgel Manifold, MD 09/28/16 1743

## 2016-09-28 NOTE — ED Provider Notes (Signed)
[facility] DEPT Provider Note    By signing my name below, I, Bea Graff, attest that this documentation has been prepared under the direction and in the presence of [facility], [place]. Electronically Signed: Bea Graff, ED Scribe. 09/28/16. 5:46 PM.    History   Chief Complaint Chief Complaint  Patient presents with  . Arm Pain   The history is provided by the patient and medical records. No language interpreter was used.    Beth [name] is a 83 y.o. female with PMHx of COPD, HLD, HTN, arthritis and fibromyalgia who presents to the Emergency Department complaining of a mechanical fall that occurred earlier today. She reports associated LUE pain. She states she [place] and then fell on the left elbow and left knee causing abrasions to both areas. She has taken Ibuprofen for pain with some relief of the pain. Moving the LUE increases the pain. She denies alleviating factors. She denies head trauma, LOC, numbness, tingling or weakness of any extremity. She states her tetanus vaccination is UTD.   Past Medical History:  Diagnosis Date  . Allergy   . Anxiety    situational  . Arthritis   . Asthma   . Cataract   . COPD (chronic obstructive pulmonary disease) (HCC)    PER PT  . Diverticulitis   . Diverticulitis   . Emphysema of lung ([place])   . Esophageal dysmotility   . Fibromyalgia   . Hiatal hernia   . Hyperlipidemia   . Hypertension   . Insomnia   . Osteopenia   . UTI (lower urinary tract infection)     Patient Active Problem List   Diagnosis Date Noted  . Dysuria 12/27/2015  . Cardiomegaly 11/06/2015  . Bloating 07/20/2015  . History of recurrent UTIs 05/29/2015  . Reactive airway disease 10/23/2014  . Laryngopharyngeal reflux (LPR) 10/23/2014  . Headache(784.0) 01/17/2014  . Generalized anxiety disorder 01/16/2014  . Insomnia 01/16/2014  . Lactose intolerance 09/06/2013  . COPD (chronic obstructive pulmonary disease) ([place]) 11/08/2012  . Ataxia  03/16/2011  . HYPERLIPIDEMIA 11/13/2010  . Reaction, situational, acute, to stress 09/09/2010  . MURMUR 05/11/2009  . URINARY URGENCY 05/11/2009  . Diaphragmatic hernia 03/14/2009  . MACULAR DEGENERATION, RIGHT EYE 12/06/2008  . HYPERTENSION, BENIGN 12/06/2008  . DIVERTICULITIS, COLON 12/06/2008  . FIBROMYALGIA 12/06/2008  . OSTEOPENIA 12/06/2008    Past Surgical History:  Procedure Laterality Date  . ABDOMINAL HYSTERECTOMY    . BLADDER SURGERY     tac  . COLONOSCOPY     2008    OB History    No data available       Home Medications    Prior to Admission medications   Medication Sig Start Date End Date Taking? Authorizing Provider  albuterol (PROVENTIL HFA;VENTOLIN HFA) 108 (90 Base) MCG/ACT inhaler Inhale 2 puffs into the lungs every 6 (six) hours as needed. 07/17/16   Hali Marry, MD  azelastine (ASTELIN) 0.1 % nasal spray Place 2 sprays into both nostrils 2 (two) times daily. Use in each nostril as directed Patient taking differently: Place 2 sprays into both nostrils as needed. Use in each nostril as directed 12/13/15   Noralee Space, MD  calcium carbonate 1250 MG capsule Take 1,250 mg by mouth daily.     [provider]  Cholecalciferol (VITAMIN D3) 1000 UNITS tablet Take 1,000 Units by mouth daily.      [provider]  clonazePAM (KLONOPIN) 0.5 MG tablet TAKE ONE TABLET BY MOUTH TWICE DAILY  08/20/16   Noralee Space, MD  cyclobenzaprine (FLEXERIL) 10 MG tablet TAKE 1 TABLET BY MOUTH AT BEDTIME AS NEEDED FOR MUSCLE SPASMS 09/17/16   Hali Marry, MD  HYDROcodone-acetaminophen (NORCO) 5-325 MG tablet Take 1 tablet by mouth every 8 (eight) hours as needed for moderate pain. 09/28/16   Ashley Murrain, NP  ibuprofen (ADVIL,MOTRIN) 200 MG tablet Take 200 mg by mouth every 6 (six) hours as needed.    [provider]  metoprolol succinate (TOPROL-XL) 25 MG 24 hr tablet TAKE ONE TABLET BY MOUTH ONCE DAILY 05/07/16   Hali Marry,  MD  Multiple Vitamin (MULTIVITAMIN) tablet Take 1 tablet by mouth daily.      [provider]  Multiple Vitamins-Minerals (PRESERVISION/LUTEIN) CAPS Take 1 capsule by mouth daily.     [provider]  omeprazole (PRILOSEC) 40 MG capsule OPEN ONE CAPSULE AND SPRINKLE ON APPLESAUCE AND TAKE BY MOUTH ONCE DAILY. WAIT 30 MINUTES BEFORE EATING MEAL. 09/10/16   Pyrtle, Lajuan Lines, MD  Pyridoxine HCl (VITAMIN B-6) 500 MG tablet Take 500 mg by mouth daily.      [provider]  sertraline (ZOLOFT) 50 MG tablet TAKE ONE TABLET BY MOUTH ONCE DAILY 08/15/16   Hali Marry, MD  vitamin B-12 (CYANOCOBALAMIN) 1000 MCG tablet Take 1,000 mcg by mouth daily.      [provider]    Family History Family History  Problem Relation Age of Onset  . Cancer Mother        breast  . Hyperlipidemia Mother   . Hypertension Mother   . Dementia Mother   . Heart disease Father        MI  . Depression Father   . Hyperlipidemia Father   . Colon cancer Neg Hx   . Esophageal cancer Neg Hx   . Stomach cancer Neg Hx   . Rectal cancer Neg Hx     Social History Social History  Substance Use Topics  . Smoking status: Former Research scientist (life sciences)  . Smokeless tobacco: Never Used     Comment: smoker as a teenager  . Alcohol use No     Allergies   Doxycycline; Pravachol [pravastatin]; Trazodone and nefazodone; and Zocor [simvastatin]   Review of Systems Review of Systems  Constitutional: Negative for activity change.  Gastrointestinal: Negative for nausea and vomiting.  Musculoskeletal: Positive for arthralgias and myalgias.  Skin: Positive for color change and wound.  Neurological: Negative for syncope, weakness and numbness.  Psychiatric/Behavioral: Negative for confusion.     Physical Exam Updated Vital Signs BP (!) 146/74 (BP Location: Left Arm)   Pulse 77   Temp 98 F (36.7 C) (Oral)   Resp 14   SpO2 95%   Physical Exam  Constitutional: She is oriented to person,  place, and time. She appears well-developed and well-nourished. No distress.  HENT:  Head: Normocephalic and atraumatic.  Eyes: EOM are normal.  Neck: Normal range of motion. Neck supple.  Cardiovascular: Normal rate.   Dorsalis Pedis pulses 2+ bilaterally. Radial pulses 2+ bilaterally. Adequate circulation.  Pulmonary/Chest: Effort normal.  Musculoskeletal: She exhibits tenderness. She exhibits no edema or deformity.       Left elbow: She exhibits decreased range of motion and swelling. She exhibits no deformity. Lacerations: abrasion. Tenderness found. Radial head tenderness noted.  Abrasions and ecchymosis to left elbow and forearm. Radial pulse 2+, adequate circulation. Tenderness with ROM and palpation. Full ROM of the left wrist without pain. Full passive ROM of  the left shoulder with minimal pain. Abrasions to left knee. Full ROM of left knee without difficulty. Ambulatory without difficulty.  Neurological: She is alert and oriented to person, place, and time. No sensory deficit. Gait normal.  Skin: Skin is warm and dry.  Psychiatric: She has a normal mood and affect. Her behavior is normal.  Nursing note and vitals reviewed.    ED Treatments / Results  DIAGNOSTIC STUDIES: Oxygen Saturation is 95% on RA, adequate by my interpretation.   COORDINATION OF CARE: 5:12 PM- Will order imaging. Pt verbalizes understanding and agrees to plan.  Medications  bacitracin ointment (not administered)    Labs (all labs ordered are listed, but only abnormal results are displayed) Labs Reviewed - No data to display  Radiology Dg Elbow Complete Left  Result Date: 09/28/2016 CLINICAL DATA:  Status post fall after tripping on someone's foot, with posterior left elbow pain. Initial encounter. EXAM: LEFT ELBOW - COMPLETE 3+ VIEW COMPARISON:  None. FINDINGS: There is a slightly depressed fracture of the radial head, with an associated elbow joint effusion. No additional fractures are seen. Soft  tissue swelling is noted about the elbow. IMPRESSION: Slightly depressed fracture of the radial head, with associated elbow joint effusion. Electronically Signed   By: Garald Balding M.D.   On: 09/28/2016 17:39    Procedures Procedures (including critical care time)  Medications Ordered in ED Medications  bacitracin ointment (not administered)     Initial Impression / Assessment and Plan / ED Course  I have reviewed the triage vital signs and the nursing notes.  Patient presents after a mechanical fall that occurred earlier today. X-Ray showing slightly depressed fracture of the radial head, with associated elbow joint effusion.  Pt advised to follow up with orthopedics. Patient given sling and ice while in ED, wounds cleaned and bacitracin ointment applied, conservative therapy recommended and discussed. Patient will be discharged home & is agreeable with above plan. Returns precautions discussed. Pt appears safe for discharge. Will give Rx for a few hydrocodone so if pain increased at night she will have pain medication.    Final Clinical Impressions(s) / ED Diagnoses   Final diagnoses:  Closed nondisplaced fracture of head of left radius, initial encounter  Elbow effusion, left    New Prescriptions New Prescriptions   HYDROCODONE-ACETAMINOPHEN (NORCO) 5-325 MG TABLET    Take 1 tablet by mouth every 8 (eight) hours as needed for moderate pain.   I personally performed the services described in this documentation, which was scribed in my presence. The recorded information has been reviewed and is accurate.     Debroah Baller [place], [place] 09/28/16 1810    Virgel Manifold, MD 09/28/16 2013

## 2016-10-18 ENCOUNTER — Encounter: Payer: Self-pay | Admitting: Emergency Medicine

## 2016-10-18 ENCOUNTER — Emergency Department
Admission: EM | Admit: 2016-10-18 | Discharge: 2016-10-18 | Disposition: A | Discharge status: Home / Self Care | Payer: Medicare Other | Source: Home / Self Care

## 2016-10-18 DIAGNOSIS — N390 Urinary tract infection, site not specified: Secondary | ICD-10-CM | POA: Diagnosis not present

## 2016-10-18 LAB — POCT URINALYSIS DIP (MANUAL ENTRY)
Bilirubin, UA: NEGATIVE
GLUCOSE UA: NEGATIVE mg/dL
Ketones, POC UA: NEGATIVE mg/dL
Nitrite, UA: POSITIVE — AB
SPEC GRAV UA: 1.01 (ref 1.010–1.025)
UROBILINOGEN UA: 0.2 U/dL
pH, UA: 5.5 (ref 5.0–8.0)

## 2016-10-18 MED ORDER — CEPHALEXIN 500 MG PO CAPS: 500.0000 mg | ORAL_CAPSULE | Freq: Four times a day (QID) | ORAL | 0 refills | Status: DC

## 2016-10-18 MED ORDER — CIPROFLOXACIN HCL 500 MG PO TABS: 500.0000 mg | ORAL_TABLET | Freq: Two times a day (BID) | ORAL | 0 refills | Status: DC

## 2016-10-18 NOTE — ED Triage Notes (Signed)
Pt c/o UTI x 1 day, urgency, frequency, dysuria and ordor, hx of UTI

## 2016-10-18 NOTE — ED Provider Notes (Signed)
CSN: [id number]     Arrival date & time 10/18/16  1109 History   None    Chief Complaint  Patient presents with  . Urinary Tract Infection   (Consider location/radiation/quality/duration/timing/severity/associated sxs/prior Treatment) The history is provided by the patient. No language interpreter was used.  Urinary Tract Infection  Pain quality:  Aching Pain severity:  Moderate Onset quality:  Gradual Duration:  1 day Timing:  Constant Progression:  Worsening Chronicity:  New Relieved by:  Nothing Worsened by:  Nothing Ineffective treatments:  None tried Urinary symptoms: foul-smelling urine   Associated symptoms: no abdominal pain, no flank pain, no nausea and no vomiting   Risk factors: recurrent urinary tract infections     Past Medical History:  Diagnosis Date  . Allergy   . Anxiety    situational  . Arthritis   . Asthma   . Cataract   . COPD (chronic obstructive pulmonary disease) (HCC)    PER PT  . Diverticulitis   . Diverticulitis   . Emphysema of lung ([place])   . Esophageal dysmotility   . Fibromyalgia   . Hiatal hernia   . Hyperlipidemia   . Hypertension   . Insomnia   . Osteopenia   . UTI (lower urinary tract infection)    Past Surgical History:  Procedure Laterality Date  . ABDOMINAL HYSTERECTOMY    . BLADDER SURGERY     tac  . COLONOSCOPY     2008   Family History  Problem Relation Age of Onset  . Cancer Mother        breast  . Hyperlipidemia Mother   . Hypertension Mother   . Dementia Mother   . Heart disease Father        MI  . Depression Father   . Hyperlipidemia Father   . Colon cancer Neg Hx   . Esophageal cancer Neg Hx   . Stomach cancer Neg Hx   . Rectal cancer Neg Hx    Social History  Substance Use Topics  . Smoking status: Former Research scientist (life sciences)  . Smokeless tobacco: Never Used     Comment: smoker as a teenager  . Alcohol use No   OB History    No data available     Review of Systems  Gastrointestinal: Negative for  abdominal pain, nausea and vomiting.  Genitourinary: Negative for flank pain.  All other systems reviewed and are negative.   Allergies  Doxycycline; Pravachol [pravastatin]; Trazodone and nefazodone; and Zocor [simvastatin]  Home Medications   Prior to Admission medications   Medication Sig Start Date End Date Taking? Authorizing Provider  albuterol (PROVENTIL HFA;VENTOLIN HFA) 108 (90 Base) MCG/ACT inhaler Inhale 2 puffs into the lungs every 6 (six) hours as needed. 07/17/16   Hali Marry, MD  azelastine (ASTELIN) 0.1 % nasal spray Place 2 sprays into both nostrils 2 (two) times daily. Use in each nostril as directed Patient taking differently: Place 2 sprays into both nostrils as needed. Use in each nostril as directed 12/13/15   Noralee Space, MD  calcium carbonate 1250 MG capsule Take 1,250 mg by mouth daily.     [provider]  Cholecalciferol (VITAMIN D3) 1000 UNITS tablet Take 1,000 Units by mouth daily.      [provider]  ciprofloxacin (CIPRO) 500 MG tablet Take 1 tablet (500 mg total) by mouth 2 (two) times daily. 10/18/16   Fransico Meadow, PA-C  clonazePAM (KLONOPIN) 0.5 MG tablet TAKE ONE TABLET BY MOUTH TWICE DAILY  08/20/16   Noralee Space, MD  cyclobenzaprine (FLEXERIL) 10 MG tablet TAKE 1 TABLET BY MOUTH AT BEDTIME AS NEEDED FOR MUSCLE SPASMS 09/17/16   Hali Marry, MD  HYDROcodone-acetaminophen (NORCO) 5-325 MG tablet Take 1 tablet by mouth every 8 (eight) hours as needed for moderate pain. 09/28/16   Ashley Murrain, NP  ibuprofen (ADVIL,MOTRIN) 200 MG tablet Take 200 mg by mouth every 6 (six) hours as needed.    [provider]  metoprolol succinate (TOPROL-XL) 25 MG 24 hr tablet TAKE ONE TABLET BY MOUTH ONCE DAILY 05/07/16   Hali Marry, MD  Multiple Vitamin (MULTIVITAMIN) tablet Take 1 tablet by mouth daily.      [provider]  Multiple Vitamins-Minerals (PRESERVISION/LUTEIN) CAPS Take 1 capsule by mouth  daily.     [provider]  omeprazole (PRILOSEC) 40 MG capsule OPEN ONE CAPSULE AND SPRINKLE ON APPLESAUCE AND TAKE BY MOUTH ONCE DAILY. WAIT 30 MINUTES BEFORE EATING MEAL. 09/10/16   Pyrtle, Lajuan Lines, MD  Pyridoxine HCl (VITAMIN B-6) 500 MG tablet Take 500 mg by mouth daily.      [provider]  sertraline (ZOLOFT) 50 MG tablet TAKE ONE TABLET BY MOUTH ONCE DAILY 08/15/16   Hali Marry, MD  vitamin B-12 (CYANOCOBALAMIN) 1000 MCG tablet Take 1,000 mcg by mouth daily.      [provider]   Meds Ordered and Administered this Visit  Medications - No data to display  BP (!) 153/83 (BP Location: Left Arm)   Pulse 76   Temp 97.6 F (36.4 C) (Oral)   Ht 5\' 4"  (1.626 m)   Wt 157 lb (71.2 kg)   SpO2 93%   BMI 26.95 kg/m  No data found.   Physical Exam  Constitutional: She appears well-developed and well-nourished. No distress.  HENT:  Head: Normocephalic and atraumatic.  Eyes: Conjunctivae are normal.  Neck: Neck supple.  Cardiovascular: Normal rate and regular rhythm.   No murmur heard. Pulmonary/Chest: Effort normal and breath sounds normal. No respiratory distress.  Abdominal: Soft. There is no tenderness.  Musculoskeletal: She exhibits no edema.  Neurological: She is alert.  Skin: Skin is warm and dry.  Psychiatric: She has a normal mood and affect.  Nursing note and vitals reviewed.   Urgent Care Course     Procedures (including critical care time)  Labs Review Labs Reviewed  POCT URINALYSIS DIP (MANUAL ENTRY) - Abnormal; Notable for the following:       Result Value   Clarity, UA cloudy (*)    Blood, UA moderate (*)    Protein Ur, POC trace (*)    Nitrite, UA Positive (*)    Leukocytes, UA Moderate (2+) (*)    All other components within normal limits  URINE CULTURE    Imaging Review No results found.   Visual Acuity Review  Right Eye Distance:   Left Eye Distance:   Bilateral Distance:    Right Eye Near:   Left Eye  Near:    Bilateral Near:         MDM urine culture pending.  Rx for cipro.  Pt advised to follow up with her MD for recheck next week   1. Urinary tract infection without hematuria, site unspecified    Meds ordered this encounter  Medications  . DISCONTD: cephALEXin (KEFLEX) 500 MG capsule    Sig: Take 1 capsule (500 mg total) by mouth 4 (four) times daily.    Dispense:  40 capsule  Refill:  0    Order Specific Question:   Supervising Provider    Answer:   Burnett Harry, DAVID [[id number]]  . ciprofloxacin (CIPRO) 500 MG tablet    Sig: Take 1 tablet (500 mg total) by mouth 2 (two) times daily.    Dispense:  20 tablet    Refill:  0    Order Specific Question:   Supervising Provider    Answer:   Burnett Harry, DAVID [[id number]]  An After Visit Summary was printed and given to the patient.     Fransico Meadow, [place] 10/18/16 1251

## 2016-10-21 ENCOUNTER — Telehealth: Payer: Self-pay | Admitting: *Deleted

## 2016-10-21 LAB — URINE CULTURE

## 2016-10-21 NOTE — Telephone Encounter (Signed)
Callback: Patient given UCX results. Encouraged to complete antibiotic. She reports she is improving.

## 2016-12-05 ENCOUNTER — Other Ambulatory Visit: Payer: Self-pay | Admitting: Family Medicine

## 2016-12-26 ENCOUNTER — Other Ambulatory Visit: Payer: Self-pay | Admitting: Family Medicine

## 2017-01-02 ENCOUNTER — Other Ambulatory Visit: Payer: Self-pay | Admitting: Family Medicine

## 2017-02-02 ENCOUNTER — Other Ambulatory Visit: Payer: Self-pay | Admitting: Family Medicine

## 2017-02-03 ENCOUNTER — Other Ambulatory Visit: Payer: Self-pay | Admitting: Family Medicine

## 2017-02-04 ENCOUNTER — Encounter: Payer: Self-pay | Admitting: Family Medicine

## 2017-02-04 ENCOUNTER — Ambulatory Visit (INDEPENDENT_AMBULATORY_CARE_PROVIDER_SITE_OTHER): Payer: Medicare Other | Admitting: Family Medicine

## 2017-02-04 VITALS — BP 138/66 | HR 80 | Ht 64.0 in | Wt 159.0 lb

## 2017-02-04 DIAGNOSIS — F430 Acute stress reaction: Secondary | ICD-10-CM

## 2017-02-04 DIAGNOSIS — Z23 Encounter for immunization: Secondary | ICD-10-CM

## 2017-02-04 DIAGNOSIS — H9193 Unspecified hearing loss, bilateral: Secondary | ICD-10-CM | POA: Diagnosis not present

## 2017-02-04 DIAGNOSIS — I10 Essential (primary) hypertension: Secondary | ICD-10-CM | POA: Diagnosis not present

## 2017-02-04 DIAGNOSIS — J449 Chronic obstructive pulmonary disease, unspecified: Secondary | ICD-10-CM | POA: Diagnosis not present

## 2017-02-04 DIAGNOSIS — F411 Generalized anxiety disorder: Secondary | ICD-10-CM | POA: Diagnosis not present

## 2017-02-04 MED ORDER — SERTRALINE HCL 50 MG PO TABS: 50.0000 mg | ORAL_TABLET | Freq: Every day | ORAL | 1 refills | Status: DC

## 2017-02-04 MED ORDER — CYCLOBENZAPRINE HCL 10 MG PO TABS: 10.0000 mg | ORAL_TABLET | Freq: Every evening | ORAL | 0 refills | Status: DC | PRN

## 2017-02-04 NOTE — Progress Notes (Signed)
Subjective:    Patient ID: Beth [name], female    DOB: 27-Mar-1933, 83 y.o.   MRN: [id number]  HPI Hypertension- Pt denies chest pain, SOB, dizziness, or heart palpitations.  Taking meds as directed w/o problems.  Denies medication side effects.    F/U GAD - Currently on Zoloft 50 mg daily. Requesting refills today. Overall she is doing well and symptoms are well controlled.  COPD - no recent flares or exacerbations.  Hearing Loss - She has been evaluated for hearing aids but has not out to purchase them yet. The quite an investment and she's just not sure what she wants to do.  She reports that she's had a little bit more pressure in her low back. She was last seen for back pain back in March and that time we did a trial of prednisone and a muscle relaxer.  Review of Systems  BP 138/66   Pulse 80   Ht 5\' 4"  (1.626 m)   Wt 159 lb (72.1 kg)   SpO2 98%   BMI 27.29 kg/m     Allergies  Allergen Reactions  . Doxycycline Other (See Comments)    Blisters on mouth and lips  . Pravachol [Pravastatin]     Fatigued.   . Trazodone And Nefazodone Other (See Comments)    Made her hyper  . Zocor [Simvastatin] Other (See Comments)    Myalgias and memory loss    Past Medical History:  Diagnosis Date  . Allergy   . Anxiety    situational  . Arthritis   . Asthma   . Cataract   . COPD (chronic obstructive pulmonary disease) (HCC)    PER PT  . Diverticulitis   . Diverticulitis   . Emphysema of lung ([place])   . Esophageal dysmotility   . Fibromyalgia   . Hiatal hernia   . Hyperlipidemia   . Hypertension   . Insomnia   . Osteopenia   . UTI (lower urinary tract infection)     Past Surgical History:  Procedure Laterality Date  . ABDOMINAL HYSTERECTOMY    . BLADDER SURGERY     tac  . COLONOSCOPY     2008    Social History   Social History  . Marital status: Widowed    Spouse name: N/A  . Number of children: 2  . Years of education: N/A   Occupational History  .  retired    Social History Main Topics  . Smoking status: Former Research scientist (life sciences)  . Smokeless tobacco: Never Used     Comment: smoker as a teenager  . Alcohol use No  . Drug use: No  . Sexual activity: Not on file   Other Topics Concern  . Not on file   Social History Narrative  . No narrative on file    Family History  Problem Relation Age of Onset  . Cancer Mother        breast  . Hyperlipidemia Mother   . Hypertension Mother   . Dementia Mother   . Heart disease Father        MI  . Depression Father   . Hyperlipidemia Father   . Colon cancer Neg Hx   . Esophageal cancer Neg Hx   . Stomach cancer Neg Hx   . Rectal cancer Neg Hx     Outpatient Encounter Prescriptions as of 02/04/2017  Medication Sig  . albuterol (PROVENTIL HFA;VENTOLIN HFA) 108 (90 Base) MCG/ACT inhaler Inhale 2 puffs into the lungs every  6 (six) hours as needed.  Marland Kitchen azelastine (ASTELIN) 0.1 % nasal spray Place 2 sprays into both nostrils 2 (two) times daily. Use in each nostril as directed (Patient taking differently: Place 2 sprays into both nostrils as needed. Use in each nostril as directed)  . calcium carbonate 1250 MG capsule Take 1,250 mg by mouth daily.   . Cholecalciferol (VITAMIN D3) 1000 UNITS tablet Take 1,000 Units by mouth daily.    . clonazePAM (KLONOPIN) 0.5 MG tablet TAKE ONE TABLET BY MOUTH TWICE DAILY  . cyclobenzaprine (FLEXERIL) 10 MG tablet Take 1 tablet (10 mg total) by mouth at bedtime as needed for muscle spasms.  Marland Kitchen ibuprofen (ADVIL,MOTRIN) 200 MG tablet Take 200 mg by mouth every 6 (six) hours as needed.  . metoprolol succinate (TOPROL-XL) 25 MG 24 hr tablet Take 1 tablet (25 mg total) by mouth daily.  . Multiple Vitamin (MULTIVITAMIN) tablet Take 1 tablet by mouth daily.    . Multiple Vitamins-Minerals (PRESERVISION/LUTEIN) CAPS Take 1 capsule by mouth daily.   Marland Kitchen omeprazole (PRILOSEC) 40 MG capsule OPEN ONE CAPSULE AND SPRINKLE ON APPLESAUCE AND TAKE BY MOUTH ONCE DAILY. WAIT 30 MINUTES  BEFORE EATING MEAL.  Marland Kitchen Pyridoxine HCl (VITAMIN B-6) 500 MG tablet Take 500 mg by mouth daily.    . sertraline (ZOLOFT) 50 MG tablet Take 1 tablet (50 mg total) by mouth daily.  . vitamin B-12 (CYANOCOBALAMIN) 1000 MCG tablet Take 1,000 mcg by mouth daily.    . [DISCONTINUED] cyclobenzaprine (FLEXERIL) 10 MG tablet Take 1 tablet (10 mg total) by mouth at bedtime as needed for muscle spasms. Due for follow up visit  . [DISCONTINUED] sertraline (ZOLOFT) 50 MG tablet TAKE ONE TABLET BY MOUTH ONCE DAILY  . [DISCONTINUED] ciprofloxacin (CIPRO) 500 MG tablet Take 1 tablet (500 mg total) by mouth 2 (two) times daily.  . [DISCONTINUED] HYDROcodone-acetaminophen (NORCO) 5-325 MG tablet Take 1 tablet by mouth every 8 (eight) hours as needed for moderate pain.   No facility-administered encounter medications on file as of 02/04/2017.          Objective:   Physical Exam  Constitutional: She is oriented to person, place, and time. She appears well-developed and well-nourished.  HENT:  Head: Normocephalic and atraumatic.  Cardiovascular: Normal rate, regular rhythm and normal heart sounds.   Pulmonary/Chest: Effort normal and breath sounds normal.  Neurological: She is alert and oriented to person, place, and time.  Skin: Skin is warm and dry.  Psychiatric: She has a normal mood and affect. Her behavior is normal.        Assessment & Plan:  HTN  - Well controlled. Continue current regimen. Follow up in  6 months.    GAD - GAD- 7 score of 0 today.  Continue current regimen doing very well.   COPD - Stable. No recent flares or exacerbations.  Hearing Loss - encouraged her to check place a slight cough [facility] to see if they offer better pricing for her the hearing aids.  Low back pain-given handout on stretches to do on her own. Certainly if it's getting worse or getting painful similar to when I saw her back in the spring then please let us know and we can always consider x-ray for  further evaluation or more formal physical therapy if needed.

## 2017-02-04 NOTE — Patient Instructions (Signed)
Call [facility] and see what options and pricing they have on hearing aids.

## 2017-02-14 LAB — COMPLETE METABOLIC PANEL WITH GFR
AG Ratio: 1.6 (calc) (ref 1.0–2.5)
ALKALINE PHOSPHATASE (APISO): 57 U/L (ref 33–130)
ALT: 13 U/L (ref 6–29)
AST: 19 U/L (ref 10–35)
Albumin: 4.2 g/dL (ref 3.6–5.1)
BUN: 9 mg/dL (ref 7–25)
CALCIUM: 9.6 mg/dL (ref 8.6–10.4)
CO2: 30 mmol/L (ref 20–32)
CREATININE: 0.71 mg/dL (ref 0.60–0.88)
Chloride: 102 mmol/L (ref 98–110)
GFR, EST NON AFRICAN AMERICAN: 79 mL/min/{1.73_m2} (ref >=60)
GFR, Est African American: 92 mL/min/{1.73_m2} (ref >=60)
GLUCOSE: 83 mg/dL (ref 65–99)
Globulin: 2.7 g/dL (calc) (ref 1.9–3.7)
Potassium: 4.4 mmol/L (ref 3.5–5.3)
Sodium: 140 mmol/L (ref 135–146)
Total Bilirubin: 0.5 mg/dL (ref 0.2–1.2)
Total Protein: 6.9 g/dL (ref 6.1–8.1)

## 2017-02-14 LAB — LIPID PANEL W/REFLEX DIRECT LDL
CHOLESTEROL: 209 mg/dL — AB (ref <200)
HDL: 43 mg/dL — ABNORMAL LOW (ref >50)
LDL CHOLESTEROL (CALC): 134 mg/dL — AB
Non-HDL Cholesterol (Calc): 166 mg/dL (calc) — ABNORMAL HIGH (ref <130)
Total CHOL/HDL Ratio: 4.9 (calc) (ref <5.0)
Triglycerides: 180 mg/dL — ABNORMAL HIGH (ref <150)

## 2017-02-25 ENCOUNTER — Other Ambulatory Visit: Payer: Self-pay | Admitting: Family Medicine

## 2017-02-26 ENCOUNTER — Other Ambulatory Visit: Payer: Self-pay | Admitting: Family Medicine

## 2017-03-05 ENCOUNTER — Other Ambulatory Visit: Payer: Self-pay | Admitting: Family Medicine

## 2017-03-06 ENCOUNTER — Other Ambulatory Visit: Payer: Self-pay | Admitting: Family Medicine

## 2017-03-22 ENCOUNTER — Other Ambulatory Visit: Payer: Self-pay | Admitting: Pulmonary Disease

## 2017-03-23 NOTE — Telephone Encounter (Signed)
Patient is requesting refill on clonopin. Patient was last seen on 8.17.17 with a last refill of this medication on 4.25.18 with quantity of 60 and 5 refills.   Current Outpatient Medications on File Prior to Visit  Medication Sig Dispense Refill  . albuterol (PROVENTIL HFA;VENTOLIN HFA) 108 (90 Base) MCG/ACT inhaler Inhale 2 puffs into the lungs every 6 (six) hours as needed. 1 Inhaler 2  . azelastine (ASTELIN) 0.1 % nasal spray Place 2 sprays into both nostrils 2 (two) times daily. Use in each nostril as directed (Patient taking differently: Place 2 sprays into both nostrils as needed. Use in each nostril as directed) 30 mL 6  . calcium carbonate 1250 MG capsule Take 1,250 mg by mouth daily.     . Cholecalciferol (VITAMIN D3) 1000 UNITS tablet Take 1,000 Units by mouth daily.      . clonazePAM (KLONOPIN) 0.5 MG tablet TAKE ONE TABLET BY MOUTH TWICE DAILY 60 tablet 5  . cyclobenzaprine (FLEXERIL) 10 MG tablet TAKE 1 TABLET BY MOUTH AT BEDTIME AS NEEDED FOR MUSCLE SPASMS 20 tablet 0  . ibuprofen (ADVIL,MOTRIN) 200 MG tablet Take 200 mg by mouth every 6 (six) hours as needed.    . metoprolol succinate (TOPROL-XL) 25 MG 24 hr tablet Take 1 tablet (25 mg total) by mouth daily. 90 tablet 1  . Multiple Vitamin (MULTIVITAMIN) tablet Take 1 tablet by mouth daily.      . Multiple Vitamins-Minerals (PRESERVISION/LUTEIN) CAPS Take 1 capsule by mouth daily.     Marland Kitchen omeprazole (PRILOSEC) 40 MG capsule OPEN ONE CAPSULE AND SPRINKLE ON APPLESAUCE AND TAKE BY MOUTH ONCE DAILY. WAIT 30 MINUTES BEFORE EATING MEAL. 30 capsule 4  . Pyridoxine HCl (VITAMIN B-6) 500 MG tablet Take 500 mg by mouth daily.      . sertraline (ZOLOFT) 50 MG tablet Take 1 tablet (50 mg total) by mouth daily. 90 tablet 1  . vitamin B-12 (CYANOCOBALAMIN) 1000 MCG tablet Take 1,000 mcg by mouth daily.      . [DISCONTINUED] fexofenadine (ALLEGRA) 180 MG tablet Take 180 mg by mouth daily.       No current facility-administered medications on file  prior to visit.

## 2017-03-24 ENCOUNTER — Telehealth: Payer: Self-pay | Admitting: Pulmonary Disease

## 2017-03-24 NOTE — Telephone Encounter (Signed)
SN please advise on refill on clonazepam 0.5 #60  Last OV 12/13/2015 Last RX 08/20/2016  Medication  clonazePAM (KLONOPIN) 0.5 MG tablet [9637]  clonazePAM (KLONOPIN) 0.5 MG tablet [[id number]]  Order Details  Dose, Route, Frequency: As Directed   Dispense Quantity: 60 tablet Refills: 5 Fills remaining: --        Sig: TAKE ONE TABLET BY MOUTH TWICE DAILY       Written Date: 08/20/16 Expiration Date: 02/16/17    Start Date: 08/20/16 End Date: --         Ordering Provider:  Noralee Space, MD DEA #:  [id number] NPI:  [id number]

## 2017-03-24 NOTE — Telephone Encounter (Signed)
Pt is aware of SN recommendations and voiced her understanding. Nothing further needed.

## 2017-03-24 NOTE — Telephone Encounter (Signed)
Per SN: Pt last seen 12/03/15 which has been over 1 year now; she will need to get this Rx from her PCP who sees her regularly. Thanks.

## 2017-03-25 NOTE — Telephone Encounter (Signed)
Closing encounter. Nothing further is needed.

## 2017-03-25 NOTE — Telephone Encounter (Signed)
Please refer to phone note from 11/27.

## 2017-03-30 ENCOUNTER — Ambulatory Visit: Payer: Medicare Other | Admitting: Pulmonary Disease

## 2017-03-30 ENCOUNTER — Encounter: Payer: Self-pay | Admitting: Pulmonary Disease

## 2017-03-30 VITALS — BP 130/80 | HR 73

## 2017-03-30 DIAGNOSIS — K449 Diaphragmatic hernia without obstruction or gangrene: Secondary | ICD-10-CM

## 2017-03-30 DIAGNOSIS — K219 Gastro-esophageal reflux disease without esophagitis: Secondary | ICD-10-CM

## 2017-03-30 DIAGNOSIS — F411 Generalized anxiety disorder: Secondary | ICD-10-CM

## 2017-03-30 DIAGNOSIS — J4520 Mild intermittent asthma, uncomplicated: Secondary | ICD-10-CM

## 2017-03-30 MED ORDER — CLONAZEPAM 0.5 MG PO TABS: 0.5000 mg | ORAL_TABLET | Freq: Two times a day (BID) | ORAL | 5 refills | Status: DC

## 2017-03-30 NOTE — Patient Instructions (Signed)
Today we updated your med list in our EPIC system...    Continue your current medications the same...  We refilled your Klonopin per request...  For your nocturnal reflux>>    Take the Prilosec40 about 30 min before the evening meal...    Do not eat or drink anything after dinner...    Elev the head of your bed about 6"  Call for any questions...  Let's plan a follow up visit in 85yr, sooner if needed for problems.Marland KitchenMarland Kitchen

## 2017-03-30 NOTE — Progress Notes (Signed)
Subjective:     Patient ID: Beth [name], female   DOB: 1932/09/10, 83 y.o.   MRN: [id number]  HPI ~  October 23, 2014:  Initial consult by SN>        31 y/o WF, referred by Dr. Beatrice Lecher [facility] in [place]), for a pulmonary evaluation>  Beth [name] relates a circuitous hx starting ~12/2013 when she felt bad & went to the ER for eval, diagnosed w/ pneumonia (RLL) & was Hosp x 3d;  Records reviewed in EPIC> CAP (nos), COPD (?asthma ?bronchiectasis), HBP, HH, Divertics, HA, anxiety/depression;  Treated w/ Rocephin & Zithromax, then changed to Levaquin at disch also w/ an AlbutHFA rescue inhaler for prn use, Tussionex & Mucinex;  Since then she has noted upper resp symptoms w/ some hoarseness, cough, prob LPR & reflux w/ throat tightness; she notes that she will occas choke on water;  otherw she gives a long hx w/ mult somatic complaints- pain in ribs and back, discomfort worse w/ movement etc, note hx fibromyalgia...       Beth [name] is essentially a never-smoker having smoked some in her teens- from 23 to 57 then quit & never restarted;  She tells me she had pneumonia as a child, then several bouts as an adult (under the care of Dr.Carlton Harris) but no known sequelae;  She was never told to have Asthma, and heard about poss COPD for the 1st time last yr;  She does recall "allergies" but never formally tested...  She denies any occupational exposures and FamHx is NEG for lung diseases...       Current Meds> Breo one puff daily, NEBS w/ Duoneb but not using regularly due to some jitteriness...  EXAM reveals Afeb, VSS, O2sat=94% on RA;  HEENT- neg, mallampati1;  Chest- essentially clear w/o w/r/r;  Heart- RR w/o m/r/g;  Abd- soft, neg;  Ext- neg w/o c/c/e...  2DEcho 11/12 showed mild LVH, norm LVF w/ EF=60%, no regional wall motion abn, no cardiac source of embolism found...  Spirometry 7/14 showed FVC=2.21 (88%), FEV1=1.32 (71%), %1sec=60, mid-flows were reduced at 49% predicted... c/w mod airflow  obstruction.  EKG 9/15 showed NSR, rate100, PACs, NSSTTWA, NAD...  CT Abd&Pelvis 9/15 showed patchy airsp dis in RLL w/ focal bronchial wall thickening, mod sized sliding HH, plus 65mm left renal cyst/ s/p hyst/ extensive colonic divertics, diffuse DDD...  Spirometry 1/16 showed FVC=2.22 (87%), FEV1=1.71 (91%), %1sec=77, mid-flows were wnl at 110% predicted... This study is WNL.Marland KitchenMarland Kitchen  CXR 4/16 showed borderline cardiomeg. right mid lung scarring, some hyperinflation, kyphosis, NAD...   LABS EPIC review>  CBC- wnl x eos=10%;  Chems- wnl;    IMP/PLAN>>  Beth [name] had some evid of airflow obstruction on Spirometry in 2014 but improved (a reversible asthmatic component) on her more recent study;  She has been on the [facility] & appears improved;  Her current symptoms sound more like LPR, reflux-related w/ throat tightness etc;  We discussed a good antireflux regimen w/ PPI about 30 min before dinner, NPO after dinner, elev HOB on 6" blocks, etc;  In addition I would like to try her on a "combination relaxer" to help her throat tightness AND help her rest better at night- KLONOPIN 0.5mg  bid should be beneficial... I offered to switch her Duoneb to Xopenex in hopes of decreasing any jitteriness for the beta agonist but she prefers to keep these meds the same for now... REC an ROV recheck in [place]...   ~  November 21, 2014:  64mo  ROV & Beth [name] reports that she is improved on the Klonopin Rx and the antireflux regimen; she specifically notes feeling rested, alert, feeling well now; she still reports some jitteriness after the Nebulizer w/ Duoneb & I again offered to change to Xopenex but she wants to save $$ therefore try using 1/2 of the vial; she requests VentolinHFA refill- she tells me she stopped the Prilosec, Amitrip, and Breo on her own "I don't like it"; we reviewed her CT w/ mod sliding HH and the need for PPI & antireflux regimen (we reviewed this again)...  EXAM reveals Afeb, VSS, O2sat=95% on RA;  HEENT- neg,  mallampati1;  Chest- essentially clear w/o w/r/r;  Heart- RR w/o m/r/g;  Abd- soft, neg;  Ext- neg w/o c/c/e... IMP/PLAN>>  Beth [name]'s symptoms are improved w/ a combination of Klonopin & Antireflux regimen- we reviewed this treatment & directions for continuing these going forward & adjusting the doses; she will call for any breathing problems and plan recheck in 6 months...   ~  May 29, 2015:  7mo ROV & Beth [name] is 83 y/o now- PCP is DrECorey & DrCMetheney in [place] & she is checked monthly (notes reviewed in Epic)- last 04/06/15 for dysuria, UTI (Rx [place])... Today she is c/o recurrent UTI symptoms- she had urethral sling surg for stress incont yrs ago by DrDahlstedt, hx recurrent UTIs; c/o freq/ urgency/ urge incont/ known cystocele, occas constipation=> felt to have OB, she did not want additional meds, given exercise training & f/u Urology...    Her breathing is OK, resting well, using NEB w/ 1/2 vial duoneb ave once per day, also has ProairHFA rescue inhaler (hasn't needed); she is taking the Klonopin 0.5mg Bid, and following the antireflux regimen w/ Prilosec20/d, & doesn't eat or drink much after dinner (she has not elev the [name] 6" as requested)- recall that she has a modHH on CT Chest... we reviewed the following medical problems during today's office visit >>     Reported mild-mod airflow obstruction w/ an apparent reversible component> she has a min smoking hx & therefore not likely COPD so I favor RADS- this fits w/ her hx of trouble since 9/15 bout of pneumonia;  She was on a good regimen w/ BREO (she subseq stopped on her own), & Albuterol via rescue inhaler or NEB w/ Duoneb ~1/2 vial in NEB prn...    Hiatus Hernia w/ GERD & LPR> on Prilosec20, and add vigorous antireflux regimen.    Anxiety & throat tightness-- improved on Klonopin 0.5mg  bid. EXAM reveals Afeb, VSS, O2sat=97% on RA;  HEENT- neg, mallampati1;  Chest- essentially clear w/o w/r/r;  Heart- RR w/o m/r/g;  Abd- soft, neg;  Ext- neg  w/o c/c/e...  LABS 01/2015 in Epic>  Chems- wnl;  CBC- wnl;  TSH=1.74 IMP/PLAN>>  Berna is stable from the pulm standpoint on her NEB, rescue inhaler prn, and Klonopin 0.5Bid;  She has HH & LPR & needs more vigorously applied antireflux regimen=> Prilosec before dinner, NPO after dinner, Elevate HOB 6", etc... Needs better exercise program & she will take advantage of gym at church;  She will also f/u w/ Uroogy, DrDahlstedt...  ~  December 13, 2015:  43mo ROV & Beth [name] indicates that she's doing alright overall but has several minor somatic compliants- eg states that she stays cold all the time (Labs 10/2015 all wnl including TSH), unable to sleep due to cold feet (pulses ok, no claudication, rec to wear socks Qhs), notes a runny nose (no help w/ OTC  meds- offered astelin rx)), and carafate pill gags her when she takes it (rec to dissolve in water first);  She notes that he breathing is doing well- denies cough, sput, SOB, CP, palpit, f/c/s, etc;  She is not currently taking the Klonopin & we discussed how this can help her rest at night & help w/ the sensation of dyspnea during the day...    La continues to f/u w/ PCP- DrCMetheney, & seen 11/06/15 w/ another UTI, treated for vaginitis w/ flagyl...     She has also been followed for GI by DrPyrtle> hx IBS, severe divertics, constipation, HH/ GERD- s/p colonoscopy 07/2015 revealed a rectal ulcer, bx=benign, & some narrowing of the left colon due to severe divertics;  C/o dysphagia & has a known large HH- EGD done 10/23/15 showed tortuous esoph w/ dysmotility, 7cm HH, mild schatzski ring- dilated, 2 cameron erosions, stomach & duodenum ok;  Treated w/ Prilosec & Carafate...     Reported mild-mod airflow obstruction w/ an apparent reversible component> she has a min smoking hx & therefore not likely COPD so I favor RADS- this fits w/ her hx of trouble since 9/15 bout of pneumonia;  She was on a good regimen w/ BREO (she subseq stopped on her own), & Albuterol via  rescue inhaler or NEB w/ Duoneb ~1/2 vial in NEB prn...    Hiatus Hernia w/ GERD & LPR> on Prilosec40 + Carafate from DrPyrtle, and we reviewed vigorous antireflux regimen.    Anxiety & throat tightness-- improved on Klonopin 0.5mg  bid which she is just using prn now... EXAM reveals Afeb, VSS, O2sat=94% on RA;  HEENT- neg, mallampati1;  Chest- essentially clear w/o w/r/r;  Heart- RR w/o m/r/g;  Abd- soft, non-tender, neg;  Ext- neg w/o c/c/e; Neuro- intact w/o focal abn...  CXR 11/06/15>  Borderline heart size, clear lungs w/o airsp dis, DJD Tspine, no change from old films...   2DEcho 11/14/15>  Mild conc LVH, norm LVF w/ EF=55-60%, no regional wall motion abn, norm diastolic parameters, AoV & MV essentially wnl (min MR), mild LAdil (32mm), RV & PAsys were wnl...  LABS 10/2015>  Chems- wnl;  CBC- wnl w/ Hg=12.3;  TSH=2.27 IMP/PLAN>>  Beth [name] is stable from the pulm standpoint- continue prn NEB vs rescue inhaler; advised to stay active etc;  She has signif GI issues managed by DrPyrtle, and GU problems managed by PCP & DrDahlstedt;  Mult minor somatic complaints noted, she has trouble w/ saliency, advised physical activity & mental acuity puzzles etc...   ~  March 30, 2017:  66mo ROV & pulm recheck- Pt followed for RADS on AlbutHFA prn, HH w/ GERD & LPR, Anxiety & throat tightness on Klonopin;  Her PCP is DrMetheney ([facility])... Pt indicates that she's been having a "rough time" for several weeks c/o acid reflux w/ "stuff in my throat at night when supine, not resting, and mult somatic complaints including- memory worse "I have dementia, I feel different, my personality is different" and she describes several deaths in the family & friends recently; she notes that she heats w/ oil "you can smell it"; notes min cough/ sput, no hemoptysis, SOB/DOE w/ housework or walking but she is too sedentary...     She saw PCP- DrMetheney on 02/04/17> note reviewed, HBP, GAD, no resp exacerbations; felt to be  stable & no changes made... No mention made of a UTI treated at [facility]gent [facility] in [place] 09/2016, or fall w/ fx left radial head 6/018 eval at [facility] ER  09/2016...    She is followed by [facility]- DrPyrtle & last seen 09/10/16>  GERD w/ large HH & hc cameron's erosions, IBS, divertics/ constip/ & hx rectal ulcer; said she was doing well, note reviewed, EGD/ colon in 2017- EGD pertinent findings included large 7 cm hiatal hernia, Cameron's erosions and Schatzki's ring dilated to 15.5 mm; Colonoscopy pertinent findings included solitary rectal ulcer and severe diverticulosis; Rx w/ Prilosec, antireflux regimen, Miralax vs MOM for her constip... We reviewed the following medical problems during today's office visit>      Reported mild-mod airflow obstruction w/ an apparent reversible component> she has a min smoking hx & therefore not likely COPD so I favor RADS- this fits w/ her hx of trouble since 9/15 bout of pneumonia;  She was on a good regimen w/ BREO (she subseq stopped on her own), & Albuterol via rescue inhaler or NEB w/ Duoneb ~1/2 vial in NEB prn...    Hiatus Hernia w/ GERD & LPR> on Prilosec40 & off Carafate; followed by DrPyrtle, and we reviewed vigorous antireflux regimen.    Anxiety & throat tightness-- improved on Klonopin 0.5mg  bid which she is just using prn now... EXAM reveals Afeb, VSS, O2sat=96% on RA;  HEENT- neg, mallampati1;  Chest- essentially clear w/o w/r/r;  Heart- RR w/o m/r/g;  Abd- soft, non-tender, neg;  Ext- neg w/o c/c/e; Neuro- intact w/o focal abn...  LABS 01/2017 in Epic>  Chems- wnl w/ BS=83, Cr=0.71, LFTs wnl;  FLP- not at goals w/ TChol=209, HDL=43, LDL=134, TG=180... IMP/PLAN>>  Beth [name] is followed by DrMetheney for PCP, DrPyrtle for GI;  He has hx RADS w/o any resp exac over the last yr on prn Albut inhaler- ok to continue same; she has large HH & GERD w/ LPR & needs a vigorously applies antireflux regimen & we reviewed taking PRILOSEC40 about 30 min before the eve meal, NPO  after dinner, elev HOB 6 inches;  She will continue her [place] for anxiety;  Asked to call prn any breathing problems...     Past Medical History  Diagnosis Date  . Hypertension >> on ToprolXL 25mg /d...   . Hyperlipidemia >> on diet alone...   . Anxiety      Situational >> on Zoloft 50mg /d...  . Insomnia    Mod sliding HH on CT scan >> rec to take Prilosec20 before dinner & antireflux regimen   . Diverticulitis >> on Miralax   . Arthritis   . Fibromyalgia >> off Elavil25 now as the Klonopin works better she says   . Asthma >> on VentolinHFA vs NEB w/ Duoneb prn   . COPD (chronic obstructive pulmonary disease) => Spirometry 04/2014 was wnl     Dyspnea >> improved w/ Klonopin 0.5- 1/2 to 1 Bid    Past Surgical History:  Procedure Laterality Date  . ABDOMINAL HYSTERECTOMY    . BLADDER SURGERY     tac  . COLONOSCOPY     2008    Outpatient Encounter Medications as of 03/30/2017  Medication Sig  . albuterol (PROVENTIL HFA;VENTOLIN HFA) 108 (90 Base) MCG/ACT inhaler Inhale 2 puffs into the lungs every 6 (six) hours as needed.  Marland Kitchen azelastine (ASTELIN) 0.1 % nasal spray Place 2 sprays into both nostrils 2 (two) times daily. Use in each nostril as directed (Patient taking differently: Place 2 sprays into both nostrils as needed. Use in each nostril as directed)  . calcium carbonate 1250 MG capsule Take 1,250 mg by mouth daily.   . Cholecalciferol (VITAMIN  D3) 1000 UNITS tablet Take 1,000 Units by mouth daily.    . clonazePAM (KLONOPIN) 0.5 MG tablet Take 1 tablet (0.5 mg total) by mouth 2 (two) times daily.  . cyclobenzaprine (FLEXERIL) 10 MG tablet TAKE 1 TABLET BY MOUTH AT BEDTIME AS NEEDED FOR MUSCLE SPASMS  . ibuprofen (ADVIL,MOTRIN) 200 MG tablet Take 200 mg by mouth every 6 (six) hours as needed.  . metoprolol succinate (TOPROL-XL) 25 MG 24 hr tablet Take 1 tablet (25 mg total) by mouth daily.  . Multiple Vitamin (MULTIVITAMIN) tablet Take 1 tablet by mouth daily.    . Multiple  Vitamins-Minerals (PRESERVISION/LUTEIN) CAPS Take 1 capsule by mouth daily.   Marland Kitchen omeprazole (PRILOSEC) 40 MG capsule OPEN ONE CAPSULE AND SPRINKLE ON APPLESAUCE AND TAKE BY MOUTH ONCE DAILY. WAIT 30 MINUTES BEFORE EATING MEAL.  Marland Kitchen Pyridoxine HCl (VITAMIN B-6) 500 MG tablet Take 500 mg by mouth daily.    . sertraline (ZOLOFT) 50 MG tablet Take 1 tablet (50 mg total) by mouth daily.  . vitamin B-12 (CYANOCOBALAMIN) 1000 MCG tablet Take 1,000 mcg by mouth daily.    . [DISCONTINUED] clonazePAM (KLONOPIN) 0.5 MG tablet TAKE ONE TABLET BY MOUTH TWICE DAILY   No facility-administered encounter medications on file as of 03/30/2017.     Allergies  Allergen Reactions  . Doxycycline Other (See Comments)    Blisters on mouth and lips  . Pravachol [Pravastatin]     Fatigued.   . Trazodone And Nefazodone Other (See Comments)    Made her hyper  . Zocor [Simvastatin] Other (See Comments)    Myalgias and memory loss    Immunization History  Administered Date(s) Administered  . Influenza Split 02/18/2012  . Influenza Whole 05/04/2009  . Influenza, High Dose Seasonal PF 02/04/2017  . Influenza,inj,Quad PF,6+ Mos 02/25/2013, 05/30/2015, 12/27/2015  . Influenza-Unspecified 02/21/2014  . Pneumococcal Conjugate-13 07/17/2016  . Pneumococcal Polysaccharide-23 07/03/2004  . Tdap 02/18/2011  . Zoster 02/21/2013    .Current Medications, Allergies, Past Medical History, Past Surgical History, Family History, and Social History were reviewed in [employer] record.   Review of Systems            All symptoms NEG except where BOLDED >>  Constitutional:  F/C/S, fatigue, anorexia, unexpected weight change. HEENT:  HA, visual changes, hearing loss, earache, nasal symptoms, sore throat, mouth sores, hoarseness. Resp:  cough, sputum, hemoptysis; SOB, tightness, wheezing. Cardio:  CP, palpit, DOE, orthopnea, edema. GI:  N/V/D/C, blood in stool; reflux, abd pain, distention, gas. GU:   dysuria, freq, urgency, hematuria, flank pain, voiding difficulty. MS:  joint pain, swelling, tenderness, decr ROM; neck pain, back pain, etc. Neuro:  HA, tremors, seizures, dizziness, syncope, weakness, numbness, gait abn. Skin:  suspicious lesions or skin rash. Heme:  adenopathy, bruising, bleeding. Psyche:  confusion, agitation, sleep disturbance, hallucinations, anxiety, depression suicidal.   Objective:   Physical Exam      Vital Signs:  Reviewed...   General:  WD, WN, 83 y/o WF in NAD; alert & oriented; pleasant & cooperative... HEENT:  [place]/AT; Conjunctiva- pink, Sclera- nonicteric, EOM-wnl, PERRLA, EACs-clear, TMs-wnl; NOSE-clear; THROAT- sl red w/o lesions. Neck:  Supple w/ fair ROM; no JVD; normal carotid impulses w/o bruits; no thyromegaly or nodules palpated; no lymphadenopathy.  Chest:  Clear to P & A; without wheezes, rales, or rhonchi heard. Heart:  Regular Rhythm; norm S1 & S2 without murmurs, rubs, or gallops detected. Abdomen:  Soft & nontender- no guarding or rebound; normal bowel sounds; no organomegaly  or masses palpated. Ext:  decrROM; no deformities or arthritic changes; no varicose veins, venous insuffic, or edema;  Pulses intact w/o bruits. Neuro:  No focal neuro deficits; gait normal & balance OK. Derm:  No lesions noted; no rash etc. Lymph:  No cervical, supraclavicular, axillary, or inguinal adenopathy palpated.   Assessment:      IMP >>  Reported mild-mod airflow obstruction w/ an apparent reversible component> she didn't smoke hardly at all & therefore not likely COPD so I favor RADS-  she was on a good regimen w/ BREO, & Albuterol via rescue inhaler or NEB, she stopped the Breo on her own & appears to be doing satis on the Albut prn... Hiatus Hernia w/ GERD & LPR> on Prilosec40 + carafate per DrPyrtle, and we reviewed vigorous antireflux regimen. Anxiety & throat tightness-- improved on Klonopin 0.5mg  bid in the past, now just using this med prn...  PLAN  >>  11/21/14>   Beth [name]'s symptoms are improved w/ a combination of Klonopin & Antireflux regimen- we reviewed this treatment & directions for continuing these going forward & adjusting the doses; she will call for any breathing problems and plan recheck in 6 months 05/29/15>   Beth [name] is stable from the pulm standpoint on her NEB, rescue inhaler prn, and Klonopin 0.5Bid;  She has HH & LPR & needs more vigorously applied antireflux regimen=> Prilosec before dinner, NPO after dinner, Elevate HOB 6", etc... Needs better exercise program & she will take advantage of gym at church;  She will also f/u w/ Uroogy, DrDahlstedt. 12/13/15>   Beth [name] is stable from the pulm standpoint- continue prn NEB vs rescue inhaler; advised to stay active etc;  She has signif GI issues managed by DrPyrtle, and GU problems managed by PCP & DrDahlstedt;  Mult minor somatic complaints noted, she has trouble w/ saliency, advised physical activity & mental acuity puzzles etc 03/30/17>   Beth [name] is followed by DrMetheney for PCP, DrPyrtle for GI;  He has hx RADS w/o any resp exac over the last yr on prn Albut inhaler- ok to continue same; she has large HH & GERD w/ LPR & needs a vigorously applies antireflux regimen & we reviewed taking PRILOSEC40 about 30 min before the eve meal, NPO after dinner, elev HOB 6 inches;  She will continue her [place] for anxiety;  Asked to call prn any breathing problems     Plan:       Medication List        Accurate as of 03/30/17  3:07 PM. Always use your most recent med list.          albuterol 108 (90 Base) MCG/ACT inhaler Commonly known as:  PROVENTIL HFA;VENTOLIN HFA Inhale 2 puffs into the lungs every 6 (six) hours as needed.   azelastine 0.1 % nasal spray Commonly known as:  ASTELIN Place 2 sprays into both nostrils 2 (two) times daily. Use in each nostril as directed   calcium carbonate 1250 MG capsule   cholecalciferol 1000 units tablet Commonly known as:  VITAMIN D   clonazePAM 0.5  MG tablet Commonly known as:  KLONOPIN Take 1 tablet (0.5 mg total) by mouth 2 (two) times daily.   cyclobenzaprine 10 MG tablet Commonly known as:  FLEXERIL TAKE 1 TABLET BY MOUTH AT BEDTIME AS NEEDED FOR MUSCLE SPASMS   ibuprofen 200 MG tablet Commonly known as:  ADVIL,MOTRIN   metoprolol succinate 25 MG 24 hr tablet Commonly known as:  TOPROL-XL Take 1 tablet (25 mg  total) by mouth daily.   multivitamin tablet   omeprazole 40 MG capsule Commonly known as:  PRILOSEC OPEN ONE CAPSULE AND SPRINKLE ON APPLESAUCE AND TAKE BY MOUTH ONCE DAILY. WAIT 30 MINUTES BEFORE EATING MEAL.   PRESERVISION/LUTEIN Caps   sertraline 50 MG tablet Commonly known as:  ZOLOFT Take 1 tablet (50 mg total) by mouth daily.   vitamin B-12 1000 MCG tablet Commonly known as:  CYANOCOBALAMIN   vitamin B-6 500 MG tablet       Where to Get Your Medications    You can get these medications from any pharmacy   Bring a paper prescription for each of these medications  clonazePAM 0.5 MG tablet

## 2017-04-13 ENCOUNTER — Other Ambulatory Visit: Payer: Self-pay | Admitting: Family Medicine

## 2017-04-23 ENCOUNTER — Telehealth: Payer: Self-pay | Admitting: *Deleted

## 2017-04-23 NOTE — Telephone Encounter (Signed)
[id number] Pre Authorization sent to cover my meds.

## 2017-04-23 NOTE — Telephone Encounter (Signed)
Outcome  Approvedtoday  Effective from 04/23/2017 through 04/23/2018.Pharmacy notified

## 2017-05-05 ENCOUNTER — Telehealth: Payer: Self-pay | Admitting: Family Medicine

## 2017-05-05 NOTE — Telephone Encounter (Signed)
Pt called clinic and said she was advised by Dr.Nadel to see if PCP would take over Rx for clonazepam. Pt states she no longer needs to see him because he is a pulmonologist and he is retiring this year. She advising he feels she needs this Rx to rest properly at night. Last refill was sent by Dr.Nadel with 5 refills, so she will not need an Rx anytime soon.   Will route for review.

## 2017-05-05 NOTE — Telephone Encounter (Signed)
Pt advised. Verbalized understanding. No further questions.  

## 2017-05-05 NOTE — Telephone Encounter (Signed)
I will take over this medication but do encourage her to use sparingly.

## 2017-06-02 ENCOUNTER — Other Ambulatory Visit: Payer: Self-pay | Admitting: Family Medicine

## 2017-06-02 NOTE — Telephone Encounter (Signed)
Please just call patient and remind her to use the muscle relaxer sparingly.  I do not want her taking it every day if at all possible.  Just when she needs it.  Okay to refill.

## 2017-06-03 MED ORDER — CYCLOBENZAPRINE HCL 10 MG PO TABS: ORAL_TABLET | ORAL | 0 refills | Status: DC

## 2017-06-03 NOTE — Telephone Encounter (Signed)
Pt advised. She reports she does not take it daily. She will take it every other day, and sometimes that's only half a tab. Refill sent. No further questions.

## 2017-06-10 DIAGNOSIS — H01132 Eczematous dermatitis of right lower eyelid: Secondary | ICD-10-CM | POA: Diagnosis not present

## 2017-06-10 DIAGNOSIS — H01024 Squamous blepharitis left upper eyelid: Secondary | ICD-10-CM | POA: Diagnosis not present

## 2017-06-10 DIAGNOSIS — H01021 Squamous blepharitis right upper eyelid: Secondary | ICD-10-CM | POA: Diagnosis not present

## 2017-06-10 DIAGNOSIS — H04123 Dry eye syndrome of bilateral lacrimal glands: Secondary | ICD-10-CM | POA: Diagnosis not present

## 2017-07-01 ENCOUNTER — Telehealth: Payer: Self-pay | Admitting: Internal Medicine

## 2017-07-01 ENCOUNTER — Emergency Department (HOSPITAL_COMMUNITY)
Admission: EM | Admit: 2017-07-01 | Discharge: 2017-07-01 | Discharge status: Absent without leave | Payer: Medicare Other

## 2017-07-01 NOTE — Telephone Encounter (Signed)
Pt states she is having a lot of abdominal pain, reports she has not had a bowel movement since Sunday. Pt states she has taken laxatives but they have not helped and she cannot go. Pt concerned she may have a blockage. She wonders if she needs to come to our office or go to the ER. Discussed with pt that there are no appts available today, that if she is having that much pain she should go to the ER. Also reports her legs are starting to go numb at the bottom. Pt states she will go to the ER.

## 2017-07-14 ENCOUNTER — Encounter: Payer: Self-pay | Admitting: Physician Assistant

## 2017-07-14 ENCOUNTER — Ambulatory Visit (INDEPENDENT_AMBULATORY_CARE_PROVIDER_SITE_OTHER): Payer: Medicare HMO | Admitting: Physician Assistant

## 2017-07-14 VITALS — BP 133/84 | HR 80 | Temp 97.5°F | Wt 159.0 lb

## 2017-07-14 DIAGNOSIS — R82998 Other abnormal findings in urine: Secondary | ICD-10-CM | POA: Diagnosis not present

## 2017-07-14 DIAGNOSIS — N390 Urinary tract infection, site not specified: Secondary | ICD-10-CM | POA: Diagnosis not present

## 2017-07-14 DIAGNOSIS — R3129 Other microscopic hematuria: Secondary | ICD-10-CM | POA: Diagnosis not present

## 2017-07-14 DIAGNOSIS — R300 Dysuria: Secondary | ICD-10-CM

## 2017-07-14 LAB — POCT URINALYSIS DIPSTICK
Bilirubin, UA: NEGATIVE
GLUCOSE UA: NEGATIVE
Ketones, UA: NEGATIVE
NITRITE UA: NEGATIVE
Protein, UA: NEGATIVE
Urobilinogen, UA: 0.2 E.U./dL
pH, UA: 5.5 (ref 5.0–8.0)

## 2017-07-14 MED ORDER — CEFUROXIME AXETIL 250 MG PO TABS: 250.0000 mg | ORAL_TABLET | Freq: Two times a day (BID) | ORAL | 0 refills | Status: AC

## 2017-07-14 NOTE — Patient Instructions (Addendum)
Give your urologist a call and schedule a follow-up appointment  Urinary Tract Infection, Adult A urinary tract infection (UTI) is an infection of any part of the urinary tract, which includes the kidneys, ureters, bladder, and urethra. These organs make, store, and get rid of urine in the body. UTI can be a bladder infection (cystitis) or kidney infection (pyelonephritis). What are the causes? This infection may be caused by fungi, viruses, or bacteria. Bacteria are the most common cause of UTIs. This condition can also be caused by repeated incomplete emptying of the bladder during urination. What increases the risk? This condition is more likely to develop if:  You ignore your need to urinate or hold urine for long periods of time.  You do not empty your bladder completely during urination.  You wipe back to front after urinating or having a bowel movement, if you are female.  You are uncircumcised, if you are female.  You are constipated.  You have a urinary catheter that stays in place (indwelling).  You have a weak defense (immune) system.  You have a medical condition that affects your bowels, kidneys, or bladder.  You have diabetes.  You take antibiotic medicines frequently or for long periods of time, and the antibiotics no longer work well against certain types of infections (antibiotic resistance).  You take medicines that irritate your urinary tract.  You are exposed to chemicals that irritate your urinary tract.  You are female.  What are the signs or symptoms? Symptoms of this condition include:  Fever.  Frequent urination or passing small amounts of urine frequently.  Needing to urinate urgently.  Pain or burning with urination.  Urine that smells bad or unusual.  Cloudy urine.  Pain in the lower abdomen or back.  Trouble urinating.  Blood in the urine.  Vomiting or being less hungry than normal.  Diarrhea or abdominal pain.  Vaginal discharge,  if you are female.  How is this diagnosed? This condition is diagnosed with a medical history and physical exam. You will also need to provide a urine sample to test your urine. Other tests may be done, including:  Blood tests.  Sexually transmitted disease (STD) testing.  If you have had more than one UTI, a cystoscopy or imaging studies may be done to determine the cause of the infections. How is this treated? Treatment for this condition often includes a combination of two or more of the following:  Antibiotic medicine.  Other medicines to treat less common causes of UTI.  Over-the-counter medicines to treat pain.  Drinking enough water to stay hydrated.  Follow these instructions at home:  Take over-the-counter and prescription medicines only as told by your health care provider.  If you were prescribed an antibiotic, take it as told by your health care provider. Do not stop taking the antibiotic even if you start to feel better.  Avoid alcohol, caffeine, tea, and carbonated beverages. They can irritate your bladder.  Drink enough fluid to keep your urine clear or pale yellow.  Keep all follow-up visits as told by your health care provider. This is important.  Make sure to: ? Empty your bladder often and completely. Do not hold urine for long periods of time. ? Empty your bladder before and after sex. ? Wipe from front to back after a bowel movement if you are female. Use each tissue one time when you wipe. Contact a health care provider if:  You have back pain.  You have a fever.  You feel nauseous or vomit.  Your symptoms do not get better after 3 days.  Your symptoms go away and then return. Get help right away if:  You have severe back pain or lower abdominal pain.  You are vomiting and cannot keep down any medicines or water. This information is not intended to replace advice given to you by your health care provider. Make sure you discuss any questions  you have with your health care provider. Document Released: 01/22/2005 Document Revised: 09/26/2015 Document Reviewed: 03/05/2015 Elsevier Interactive Patient Education  [employer].

## 2017-07-14 NOTE — Progress Notes (Signed)
HPI:                                                                Beth [name] is a 84 y.o. female who presents to [facility]: [place] today for dysuria   Dysuria   This is a chronic problem. The current episode started more than 1 month ago. The problem occurs intermittently. The quality of the pain is described as burning. There has been no fever. Associated symptoms include frequency, hesitancy and urgency. Pertinent negatives include no chills, flank pain or hematuria. She has tried nothing for the symptoms. Her past medical history is significant for recurrent UTIs and a urological procedure (bladder sling).    Past Medical History:  Diagnosis Date  . Allergy   . Anxiety    situational  . Arthritis   . Asthma   . Cataract   . COPD (chronic obstructive pulmonary disease) (HCC)    PER PT  . Diverticulitis   . Diverticulitis   . Emphysema of lung ([place])   . Esophageal dysmotility   . Fibromyalgia   . Hiatal hernia   . Hyperlipidemia   . Hypertension   . Insomnia   . Osteopenia   . UTI (lower urinary tract infection)    Past Surgical History:  Procedure Laterality Date  . ABDOMINAL HYSTERECTOMY    . BLADDER SURGERY     tac  . COLONOSCOPY     2008   Social History   Tobacco Use  . Smoking status: Former Research scientist (life sciences)  . Smokeless tobacco: Never Used  . Tobacco comment: smoker as a teenager  Substance Use Topics  . Alcohol use: No    Alcohol/week: 0.0 oz   family history includes Cancer in her mother; Dementia in her mother; Depression in her father; Heart disease in her father; Hyperlipidemia in her father and mother; Hypertension in her mother.    ROS: negative except as noted in the HPI  Medications: Current Outpatient Medications  Medication Sig Dispense Refill  . albuterol (PROVENTIL HFA;VENTOLIN HFA) 108 (90 Base) MCG/ACT inhaler Inhale 2 puffs into the lungs every 6 (six) hours as needed. 1 Inhaler 2  . azelastine  (ASTELIN) 0.1 % nasal spray Place 2 sprays into both nostrils 2 (two) times daily. Use in each nostril as directed (Patient taking differently: Place 2 sprays into both nostrils as needed. Use in each nostril as directed) 30 mL 6  . calcium carbonate 1250 MG capsule Take 1,250 mg by mouth daily.     . Cholecalciferol (VITAMIN D3) 1000 UNITS tablet Take 1,000 Units by mouth daily.      . clonazePAM (KLONOPIN) 0.5 MG tablet Take 1 tablet (0.5 mg total) by mouth 2 (two) times daily. 60 tablet 5  . cyclobenzaprine (FLEXERIL) 10 MG tablet TAKE 1 TABLET BY MOUTH AT BEDTIME AS NEEDED FOR MUSCLE SPASMS 20 tablet 0  . ibuprofen (ADVIL,MOTRIN) 200 MG tablet Take 200 mg by mouth every 6 (six) hours as needed.    . metoprolol succinate (TOPROL-XL) 25 MG 24 hr tablet Take 1 tablet (25 mg total) by mouth daily. 90 tablet 1  . Multiple Vitamin (MULTIVITAMIN) tablet Take 1 tablet by mouth daily.      Marland Kitchen omeprazole (PRILOSEC) 40 MG  capsule OPEN ONE CAPSULE AND SPRINKLE ON APPLESAUCE AND TAKE BY MOUTH ONCE DAILY. WAIT 30 MINUTES BEFORE EATING MEAL. 30 capsule 4  . Pyridoxine HCl (VITAMIN B-6) 500 MG tablet Take 500 mg by mouth daily.      . sertraline (ZOLOFT) 50 MG tablet Take 1 tablet (50 mg total) by mouth daily. 90 tablet 1  . vitamin B-12 (CYANOCOBALAMIN) 1000 MCG tablet Take 1,000 mcg by mouth daily.      . cefUROXime (CEFTIN) 250 MG tablet Take 1 tablet (250 mg total) by mouth 2 (two) times daily with a meal for 7 days. 14 tablet 0   No current facility-administered medications for this visit.    Allergies  Allergen Reactions  . Doxycycline Other (See Comments)    Blisters on mouth and lips  . Pravachol [Pravastatin]     Fatigued.   . Trazodone And Nefazodone Other (See Comments)    Made her hyper  . Zocor [Simvastatin] Other (See Comments)    Myalgias and memory loss       Objective:  BP 133/84   Pulse 80   Temp (!) 97.5 F (36.4 C) (Oral)   Wt 159 lb (72.1 kg)   BMI 27.29 kg/m  Gen:   alert, not ill-appearing, no distress, appears younger than stated age HEENT: head normocephalic without obvious abnormality, conjunctiva and cornea clear, trachea midline Pulm: Normal work of breathing, normal phonation, diffuse expiratory wheezes CV: Normal rate, regular rhythm, s1 and s2 distinct, no murmurs, clicks or rubs  GI: abdomen soft, non-tender, no CVA tenderness Neuro: alert and oriented x 3, no tremor MSK: extremities atraumatic, normal gait and station Skin: intact, no rashes on exposed skin, no jaundice, no cyanosis  Results for orders placed or performed in visit on 07/14/17 (from the past 72 hour(s))  POCT Urinalysis Dipstick     Status: Abnormal   Collection Time: 07/14/17  2:32 PM  Result Value Ref Range   Color, UA yellow    Clarity, UA clear    Glucose, UA negative    Bilirubin, UA negative    Ketones, UA negative    Spec Grav, UA <=1.005 (A) 1.010 - 1.025   Blood, UA trace    pH, UA 5.5 5.0 - 8.0   Protein, UA negative    Urobilinogen, UA 0.2 0.2 or 1.0 E.U./dL   Nitrite, UA negative    Leukocytes, UA Small (1+) (A) Negative   Appearance     Odor     No results found.    Assessment and Plan: 84 y.o. female with   1. Dysuria - POCT Urinalysis Dipstick positive for trace blood and small leuks. History of recurrent UTI and bladder sling. Personally reviewed urine culture from 09/2016, which showed E. Coli sensitive to cephalosporins. Treating empirically for uncomplicated cystitis with Ceftin - Urine Culture pending - cefUROXime (CEFTIN) 250 MG tablet; Take 1 tablet (250 mg total) by mouth 2 (two) times daily with a meal for 7 days.  Dispense: 14 tablet; Refill: 0  2. Leukocytes in urine - cefUROXime (CEFTIN) 250 MG tablet; Take 1 tablet (250 mg total) by mouth 2 (two) times daily with a meal for 7 days.  Dispense: 14 tablet; Refill: 0  3. Microscopic hematuria - cefUROXime (CEFTIN) 250 MG tablet; Take 1 tablet (250 mg total) by mouth 2 (two) times daily  with a meal for 7 days.  Dispense: 14 tablet; Refill: 0   Patient education and anticipatory guidance given Patient agrees with treatment plan  Follow-up with Urologist in 1-2 weeks or sooner as needed if symptoms worsen or fail to improve

## 2017-07-16 DIAGNOSIS — H1851 Endothelial corneal dystrophy: Secondary | ICD-10-CM | POA: Diagnosis not present

## 2017-07-16 DIAGNOSIS — H353131 Nonexudative age-related macular degeneration, bilateral, early dry stage: Secondary | ICD-10-CM | POA: Diagnosis not present

## 2017-07-16 DIAGNOSIS — H04123 Dry eye syndrome of bilateral lacrimal glands: Secondary | ICD-10-CM | POA: Diagnosis not present

## 2017-07-16 DIAGNOSIS — H40013 Open angle with borderline findings, low risk, bilateral: Secondary | ICD-10-CM | POA: Diagnosis not present

## 2017-07-16 DIAGNOSIS — Z961 Presence of intraocular lens: Secondary | ICD-10-CM | POA: Diagnosis not present

## 2017-08-05 ENCOUNTER — Encounter: Payer: Self-pay | Admitting: Family Medicine

## 2017-08-05 ENCOUNTER — Ambulatory Visit (INDEPENDENT_AMBULATORY_CARE_PROVIDER_SITE_OTHER): Payer: Medicare HMO | Admitting: Family Medicine

## 2017-08-05 VITALS — BP 136/82 | HR 78 | Ht 65.0 in | Wt 157.0 lb

## 2017-08-05 DIAGNOSIS — I10 Essential (primary) hypertension: Secondary | ICD-10-CM

## 2017-08-05 DIAGNOSIS — F411 Generalized anxiety disorder: Secondary | ICD-10-CM

## 2017-08-05 DIAGNOSIS — R69 Illness, unspecified: Secondary | ICD-10-CM | POA: Diagnosis not present

## 2017-08-05 DIAGNOSIS — J4520 Mild intermittent asthma, uncomplicated: Secondary | ICD-10-CM | POA: Diagnosis not present

## 2017-08-05 MED ORDER — SERTRALINE HCL 50 MG PO TABS: 50.0000 mg | ORAL_TABLET | Freq: Every day | ORAL | 1 refills | Status: DC

## 2017-08-05 MED ORDER — METOPROLOL SUCCINATE ER 25 MG PO TB24: 25.0000 mg | ORAL_TABLET | Freq: Every day | ORAL | 1 refills | Status: DC

## 2017-08-05 NOTE — Progress Notes (Signed)
Subjective:    CC: BP and anxiety, 6 mo f/u  HPI:  Hypertension- Pt denies chest pain, SOB, dizziness, or heart palpitations.  Taking meds as directed w/o problems.  Denies medication side effects.    F/U anxiety  -he is doing well overall.  She denies any significant anxiety symptoms.  She is currently on Zoloft 50 mg daily.    Mild intermittent reactive airway disease-she follows with Dr. Wyn Quaker.  She reported that he actually will be retiring this year.  She has been coughing and getting a lot more mucus over the last couple of months.  She feels like some of it is allergy related.  She has been using her albuterol this past week which she says is very rare for her.  She denies any fevers chills or sweats.  She says she feels like the mucus is in her throat and is just not really moving very well.  Noticing some wheezing at night.  Past medical history, Surgical history, Family history not pertinant except as noted below, Social history, Allergies, and medications have been entered into the medical record, reviewed, and corrections made.   Review of Systems: No fevers, chills, night sweats, weight loss, chest pain, or shortness of breath.   Objective:    General: Well Developed, well nourished, and in no acute distress.  Neuro: Alert and oriented x3, extra-ocular muscles intact, sensation grossly intact.  HEENT: Normocephalic, atraumatic  Skin: Warm and dry, no rashes. Cardiac: Regular rate and rhythm, no murmurs rubs or gallops, no lower extremity edema.  Respiratory: Clear to auscultation bilaterally. Not using accessory muscles, speaking in full sentences.   Impression and Recommendations:    HTN - Well controlled. Continue current regimen. Follow up in  4-6 months.    Anxiety  -he is doing very well with her regimen and does not want to change it.  Medication refilled.  Mild intermittent reactive airways disease-we discussed that at some point really would be helpful for  her to repeat spirometry just to come to keep tabs on how well her lung function is doing.  We discussed may be putting her on a inhaler for prophylaxis but she declined and says she will just use her albuterol as needed.  Certainly she feels like she is getting worse or not improving or develops a fever and please let us know.  He said she would prefer to rely on her allergy medications.

## 2017-08-18 DIAGNOSIS — N390 Urinary tract infection, site not specified: Secondary | ICD-10-CM | POA: Diagnosis not present

## 2017-08-18 DIAGNOSIS — N952 Postmenopausal atrophic vaginitis: Secondary | ICD-10-CM | POA: Diagnosis not present

## 2017-08-18 DIAGNOSIS — N3000 Acute cystitis without hematuria: Secondary | ICD-10-CM | POA: Diagnosis not present

## 2017-08-18 DIAGNOSIS — B9620 Unspecified Escherichia coli [E. coli] as the cause of diseases classified elsewhere: Secondary | ICD-10-CM | POA: Diagnosis not present

## 2017-08-18 DIAGNOSIS — N3281 Overactive bladder: Secondary | ICD-10-CM | POA: Diagnosis not present

## 2017-09-07 ENCOUNTER — Other Ambulatory Visit: Payer: Self-pay | Admitting: *Deleted

## 2017-09-07 MED ORDER — CYCLOBENZAPRINE HCL 10 MG PO TABS: ORAL_TABLET | ORAL | 0 refills | Status: DC

## 2017-10-12 DIAGNOSIS — N3281 Overactive bladder: Secondary | ICD-10-CM | POA: Diagnosis not present

## 2017-10-12 DIAGNOSIS — N3000 Acute cystitis without hematuria: Secondary | ICD-10-CM | POA: Diagnosis not present

## 2017-10-22 ENCOUNTER — Other Ambulatory Visit: Payer: Self-pay | Admitting: Pulmonary Disease

## 2017-10-25 ENCOUNTER — Other Ambulatory Visit: Payer: Self-pay | Admitting: Internal Medicine

## 2017-10-26 ENCOUNTER — Telehealth: Payer: Self-pay | Admitting: Family Medicine

## 2017-10-26 NOTE — Telephone Encounter (Signed)
PT called needing a refill on  sertraline (ZOLOFT) 50 MG tablet [[id number]] clonazePAM (KLONOPIN) 0.5 MG tablet [[id number]]   She has an appointment on 10-26-17 but has ran out.

## 2017-10-26 NOTE — Telephone Encounter (Signed)
Patient is requesting a refill on Klonopin. Dr. Lenna Gilford please advise. Last refill was 03/2017 with quantity 60 and 5 refills. Patient was last seen in 03/2017   Please advise, thank you.   Current Outpatient Medications on File Prior to Visit  Medication Sig Dispense Refill  . albuterol (PROVENTIL HFA;VENTOLIN HFA) 108 (90 Base) MCG/ACT inhaler Inhale 2 puffs into the lungs every 6 (six) hours as needed. 1 Inhaler 2  . calcium carbonate 1250 MG capsule Take 1,250 mg by mouth daily.     . Cholecalciferol (VITAMIN D3) 1000 UNITS tablet Take 1,000 Units by mouth daily.      . clonazePAM (KLONOPIN) 0.5 MG tablet Take 1 tablet (0.5 mg total) by mouth 2 (two) times daily. 60 tablet 5  . cyclobenzaprine (FLEXERIL) 10 MG tablet TAKE 1 TABLET BY MOUTH AT BEDTIME AS NEEDED FOR MUSCLE SPASMS 20 tablet 0  . fexofenadine (ALLEGRA) 180 MG tablet Take 180 mg by mouth daily.    Marland Kitchen ibuprofen (ADVIL,MOTRIN) 200 MG tablet Take 200 mg by mouth every 6 (six) hours as needed.    Marland Kitchen MAGNESIUM PO Take by mouth.    . metoprolol succinate (TOPROL-XL) 25 MG 24 hr tablet Take 1 tablet (25 mg total) by mouth daily. 90 tablet 1  . Multiple Vitamin (MULTIVITAMIN) tablet Take 1 tablet by mouth daily.      Marland Kitchen omeprazole (PRILOSEC) 40 MG capsule OPEN ONE CAPSULE AND SPRINKLE ON APPLESAUCE AND TAKE BY MOUTH ONCE DAILY. WAIT 30 MINUTES BEFORE EATING MEAL. 30 capsule 4  . Pyridoxine HCl (VITAMIN B-6) 500 MG tablet Take 500 mg by mouth daily.      . sertraline (ZOLOFT) 50 MG tablet Take 1 tablet (50 mg total) by mouth daily. 90 tablet 1  . vitamin B-12 (CYANOCOBALAMIN) 1000 MCG tablet Take 1,000 mcg by mouth daily.       No current facility-administered medications on file prior to visit.    Allergies  Allergen Reactions  . Doxycycline Other (See Comments)    Blisters on mouth and lips  . Pravachol [Pravastatin]     Fatigued.   . Trazodone And Nefazodone Other (See Comments)    Made her hyper  . Zocor [Simvastatin] Other (See  Comments)    Myalgias and memory loss

## 2017-10-27 ENCOUNTER — Ambulatory Visit: Payer: Medicare HMO | Admitting: Family Medicine

## 2017-11-27 ENCOUNTER — Encounter: Payer: Self-pay | Admitting: Emergency Medicine

## 2017-11-27 ENCOUNTER — Emergency Department (INDEPENDENT_AMBULATORY_CARE_PROVIDER_SITE_OTHER)
Admission: EM | Admit: 2017-11-27 | Discharge: 2017-11-27 | Disposition: A | Discharge status: Home / Self Care | Payer: Medicare HMO | Source: Home / Self Care | Attending: Family Medicine | Admitting: Family Medicine

## 2017-11-27 DIAGNOSIS — J069 Acute upper respiratory infection, unspecified: Secondary | ICD-10-CM

## 2017-11-27 DIAGNOSIS — M5432 Sciatica, left side: Secondary | ICD-10-CM

## 2017-11-27 DIAGNOSIS — B9789 Other viral agents as the cause of diseases classified elsewhere: Secondary | ICD-10-CM | POA: Diagnosis not present

## 2017-11-27 MED ORDER — AZITHROMYCIN 250 MG PO TABS: 250.0000 mg | ORAL_TABLET | Freq: Every day | ORAL | 0 refills | Status: DC

## 2017-11-27 MED ORDER — BENZONATATE 100 MG PO CAPS: 100.0000 mg | ORAL_CAPSULE | Freq: Three times a day (TID) | ORAL | 0 refills | Status: DC

## 2017-11-27 NOTE — ED Triage Notes (Signed)
Pt c/o cough, congestion and sore throat x4 days. States she has been taking otc meds.

## 2017-11-27 NOTE — ED Provider Notes (Signed)
[name]    CSN: [id number] Arrival date & time: 11/27/17  1143     History   Chief Complaint Chief Complaint  Patient presents with  . Cough    HPI Beth [name] is a 84 y.o. female.   HPI  Beth [name] is a 84 y.o. female presenting to UC with c/o 4 days worsening cough, congestion and sore throat. She has a hx of COPD but denies chest tightness or SOB. Denies fever or chills but feels "worn down" and has had body aches.  No relief with OTC cough medication. She was around children last weekend and wonders if that is how she got sick.   Pt also c/o 1 month of worsening Left buttock pain that is worse with ambulation and certain seated positions.  No known injury. Pain is aching and sore, occasionally radiates down left leg.    Past Medical History:  Diagnosis Date  . Allergy   . Anxiety    situational  . Arthritis   . Asthma   . Cataract   . COPD (chronic obstructive pulmonary disease) (HCC)    PER PT  . Diverticulitis   . Diverticulitis   . Emphysema of lung ([place])   . Esophageal dysmotility   . Fibromyalgia   . Hiatal hernia   . Hyperlipidemia   . Hypertension   . Insomnia   . Osteopenia   . UTI (lower urinary tract infection)     Patient Active Problem List   Diagnosis Date Noted  . Recurrent UTI (urinary tract infection) 07/14/2017  . Cardiomegaly 11/06/2015  . History of recurrent UTIs 05/29/2015  . Reactive airway disease 10/23/2014  . Laryngopharyngeal reflux (LPR) 10/23/2014  . Headache(784.0) 01/17/2014  . Generalized anxiety disorder 01/16/2014  . Insomnia 01/16/2014  . Lactose intolerance 09/06/2013  . Ataxia 03/16/2011  . HYPERLIPIDEMIA 11/13/2010  . Reaction, situational, acute, to stress 09/09/2010  . MURMUR 05/11/2009  . URINARY URGENCY 05/11/2009  . Diaphragmatic hernia 03/14/2009  . MACULAR DEGENERATION, RIGHT EYE 12/06/2008  . HYPERTENSION, BENIGN 12/06/2008  . DIVERTICULITIS, COLON 12/06/2008  . FIBROMYALGIA  12/06/2008  . OSTEOPENIA 12/06/2008    Past Surgical History:  Procedure Laterality Date  . ABDOMINAL HYSTERECTOMY    . BLADDER SURGERY     tac  . COLONOSCOPY     2008    OB History   None      Home Medications    Prior to Admission medications   Medication Sig Start Date End Date Taking? Authorizing Provider  albuterol (PROVENTIL HFA;VENTOLIN HFA) 108 (90 Base) MCG/ACT inhaler Inhale 2 puffs into the lungs every 6 (six) hours as needed. 07/17/16   Hali Marry, MD  azithromycin (ZITHROMAX) 250 MG tablet Take 1 tablet (250 mg total) by mouth daily. Take first 2 tablets together, then 1 every day until finished. 11/27/17   Noe Gens, PA-C  benzonatate (TESSALON) 100 MG capsule Take 1-2 capsules (100-200 mg total) by mouth every 8 (eight) hours. 11/27/17   Noe Gens, PA-C  calcium carbonate 1250 MG capsule Take 1,250 mg by mouth daily.     [provider]  Cholecalciferol (VITAMIN D3) 1000 UNITS tablet Take 1,000 Units by mouth daily.      [provider]  clonazePAM (KLONOPIN) 0.5 MG tablet TAKE 1 TABLET BY MOUTH TWICE DAILY 10/26/17   Noralee Space, MD  cyclobenzaprine (FLEXERIL) 10 MG tablet TAKE 1 TABLET BY MOUTH AT BEDTIME AS NEEDED FOR MUSCLE SPASMS 09/07/17  Hali Marry, MD  fexofenadine (ALLEGRA) 180 MG tablet Take 180 mg by mouth daily.    [provider]  ibuprofen (ADVIL,MOTRIN) 200 MG tablet Take 200 mg by mouth every 6 (six) hours as needed.    [provider]  MAGNESIUM PO Take by mouth.    [provider]  metoprolol succinate (TOPROL-XL) 25 MG 24 hr tablet Take 1 tablet (25 mg total) by mouth daily. 08/05/17   Hali Marry, MD  Multiple Vitamin (MULTIVITAMIN) tablet Take 1 tablet by mouth daily.      [provider]  omeprazole (PRILOSEC) 40 MG capsule OPEN ONE CAPSULE AND SPRINKLE ON APPLESAUCE AND TAKE BY MOUTH ONCE DAILY. WAIT 30 MINUTES BEFORE EATING MEAL. 09/10/16   Pyrtle, Lajuan Lines,  MD  Pyridoxine HCl (VITAMIN B-6) 500 MG tablet Take 500 mg by mouth daily.      [provider]  sertraline (ZOLOFT) 50 MG tablet Take 1 tablet (50 mg total) by mouth daily. 08/05/17   Hali Marry, MD  vitamin B-12 (CYANOCOBALAMIN) 1000 MCG tablet Take 1,000 mcg by mouth daily.      [provider]    Family History Family History  Problem Relation Age of Onset  . Cancer Mother        breast  . Hyperlipidemia Mother   . Hypertension Mother   . Dementia Mother   . Heart disease Father        MI  . Depression Father   . Hyperlipidemia Father   . Colon cancer Neg Hx   . Esophageal cancer Neg Hx   . Stomach cancer Neg Hx   . Rectal cancer Neg Hx     Social History Social History   Tobacco Use  . Smoking status: Former Research scientist (life sciences)  . Smokeless tobacco: Never Used  . Tobacco comment: smoker as a teenager  Substance Use Topics  . Alcohol use: No    Alcohol/week: 0.0 oz  . Drug use: No     Allergies   Doxycycline; Pravachol [pravastatin]; Trazodone and nefazodone; and Zocor [simvastatin]   Review of Systems Review of Systems  Constitutional: Positive for fatigue. Negative for chills and fever.  HENT: Positive for congestion, postnasal drip, rhinorrhea and sore throat. Negative for ear pain, trouble swallowing and voice change.   Respiratory: Positive for cough. Negative for shortness of breath.   Cardiovascular: Negative for chest pain and palpitations.  Gastrointestinal: Negative for abdominal pain, diarrhea, nausea and vomiting.  Musculoskeletal: Positive for arthralgias and myalgias. Negative for back pain.  Skin: Negative for rash.     Physical Exam Triage Vital Signs ED Triage Vitals  Enc Vitals Group     BP 11/27/17 1248 (!) 160/96     Pulse Rate 11/27/17 1248 76     Resp --      Temp 11/27/17 1248 97.9 F (36.6 C)     Temp Source 11/27/17 1248 Oral     SpO2 11/27/17 1248 96 %     Weight 11/27/17 1249 157 lb (71.2 kg)     Height --       Head Circumference --      Peak Flow --      Pain Score 11/27/17 1249 0     Pain Loc --      Pain Edu? --      Excl. in [address]? --    No data found.  Updated Vital Signs BP (!) 151/84 (BP Location: Right Arm)   Pulse 76  Temp 97.9 F (36.6 C) (Oral)   Wt 157 lb (71.2 kg)   SpO2 96%   BMI 26.13 kg/m   Visual Acuity Right Eye Distance:   Left Eye Distance:   Bilateral Distance:    Right Eye Near:   Left Eye Near:    Bilateral Near:     Physical Exam  Constitutional: She is oriented to person, place, and time. She appears well-developed and well-nourished. No distress.  HENT:  Head: Normocephalic and atraumatic.  Right Ear: Tympanic membrane normal.  Left Ear: Tympanic membrane normal.  Nose: Nose normal. Right sinus exhibits no maxillary sinus tenderness and no frontal sinus tenderness. Left sinus exhibits no maxillary sinus tenderness and no frontal sinus tenderness.  Mouth/Throat: Uvula is midline and mucous membranes are normal. Posterior oropharyngeal erythema present.  Eyes: EOM are normal.  Neck: Normal range of motion. Neck supple.  Cardiovascular: Normal rate and regular rhythm.  Pulmonary/Chest: Effort normal and breath sounds normal. No stridor. No respiratory distress. She has no wheezes. She has no rales.  Musculoskeletal: Normal range of motion. She exhibits tenderness.  No midline spinal tenderness. Tenderness to Left buttock. Antalgic gait.   Lymphadenopathy:    She has no cervical adenopathy.  Neurological: She is alert and oriented to person, place, and time.  Skin: Skin is warm and dry. No rash noted. She is not diaphoretic.  Psychiatric: She has a normal mood and affect. Her behavior is normal.  Nursing note and vitals reviewed.    UC Treatments / Results  Labs (all labs ordered are listed, but only abnormal results are displayed) Labs Reviewed - No data to display  EKG None  Radiology No results found.  Procedures Procedures  (including critical care time)  Medications Ordered in UC Medications - No data to display  Initial Impression / Assessment and Plan / UC Course  I have reviewed the triage vital signs and the nursing notes.  Pertinent labs & imaging results that were available during my care of the patient were reviewed by me and considered in my medical decision making (see chart for details).     Hx and exam c/w viral URI Encouraged continued symptomatic treatment.  Left buttock pain c/w sciatica. No indication for imaging at this time. No red flag symptoms and no recent fall or injury.  Home care instructions provided below.   Final Clinical Impressions(s) / UC Diagnoses   Final diagnoses:  Viral URI with cough  Left sided sciatica     Discharge Instructions      Your symptoms are likely due to a virus such as the common cold, however, if you developing worsening chest congestion with shortness of breath, persistent fever for 3 days, or symptoms not improving in 4-5 days, you may fill the antibiotic (azithromycin).  If you do fill the antibiotic,  please take antibiotics as prescribed and be sure to complete entire course even if you start to feel better to ensure infection does not come back.  You may take acetaminophen (Tylenol) and ibuprofen (Motrin or Advil) as needed for your Left buttock/sciatic pain and alternate cool and warm compresses.  Please follow up with family medicine next week for recheck of symptoms if not improving.      ED Prescriptions    Medication Sig Dispense Auth. Provider   benzonatate (TESSALON) 100 MG capsule Take 1-2 capsules (100-200 mg total) by mouth every 8 (eight) hours. 21 capsule Gerarda Fraction, Azelie Noguera O, PA-C   azithromycin (ZITHROMAX) 250 MG tablet Take  1 tablet (250 mg total) by mouth daily. Take first 2 tablets together, then 1 every day until finished. 6 tablet Noe Gens, PA-C     Controlled Substance Prescriptions [place] Controlled Substance Registry  consulted? Not Applicable   Tyrell Antonio 11/27/17 1408

## 2017-11-27 NOTE — Discharge Instructions (Signed)
°  Your symptoms are likely due to a virus such as the common cold, however, if you developing worsening chest congestion with shortness of breath, persistent fever for 3 days, or symptoms not improving in 4-5 days, you may fill the antibiotic (azithromycin).  If you do fill the antibiotic,  please take antibiotics as prescribed and be sure to complete entire course even if you start to feel better to ensure infection does not come back.  You may take acetaminophen (Tylenol) and ibuprofen (Motrin or Advil) as needed for your Left buttock/sciatic pain and alternate cool and warm compresses.  Please follow up with family medicine next week for recheck of symptoms if not improving.

## 2017-12-03 ENCOUNTER — Ambulatory Visit (INDEPENDENT_AMBULATORY_CARE_PROVIDER_SITE_OTHER): Payer: Medicare HMO | Admitting: Sports Medicine

## 2017-12-03 ENCOUNTER — Encounter: Payer: Self-pay | Admitting: Sports Medicine

## 2017-12-03 ENCOUNTER — Ambulatory Visit (INDEPENDENT_AMBULATORY_CARE_PROVIDER_SITE_OTHER): Payer: Medicare HMO

## 2017-12-03 DIAGNOSIS — W57XXXA Bitten or stung by nonvenomous insect and other nonvenomous arthropods, initial encounter: Secondary | ICD-10-CM | POA: Insufficient documentation

## 2017-12-03 DIAGNOSIS — J449 Chronic obstructive pulmonary disease, unspecified: Secondary | ICD-10-CM | POA: Diagnosis not present

## 2017-12-03 DIAGNOSIS — W57XXXD Bitten or stung by nonvenomous insect and other nonvenomous arthropods, subsequent encounter: Secondary | ICD-10-CM | POA: Diagnosis not present

## 2017-12-03 DIAGNOSIS — R05 Cough: Secondary | ICD-10-CM

## 2017-12-03 NOTE — Assessment & Plan Note (Signed)
Occurred about 3 weeks ago, no rash, no fevers, chills, muscle aches, body aches. No evidence of bacterial infection, no intervention needed.

## 2017-12-03 NOTE — Assessment & Plan Note (Signed)
Treated with antibiotics, no steroids, persistent mild cough, poor air movement. Feels a bit tired but things are heading in the right direction. Adding a chest x-ray, return to see me in 2 weeks, we will do a full course of steroids, and another course of antibiotics if no better. I do not think we need to do a LABA/LAMA at this point.

## 2017-12-03 NOTE — Progress Notes (Signed)
Subjective:    CC: Coughing  HPI: For several weeks this pleasant 84 year old female with mild COPD has had a cough, minimally productive.  She was seen in urgent care, treated with azithromycin, she improved considerably.  She still has mild fatigue, headaches, cough is improved.  She is producing mild sputum.  No shortness of breath or chest pain.  In addition she had a tick bite several weeks ago, there is still a small spot on the inside of [name] right thigh, she never developed a rash, has no myalgias, arthralgias.  No fevers or chills.  I reviewed the past medical history, family history, social history, surgical history, and allergies today and no changes were needed.  Please see the problem list section below in epic for further details.  Past Medical History: Past Medical History:  Diagnosis Date  . Allergy   . Anxiety    situational  . Arthritis   . Asthma   . Cataract   . COPD (chronic obstructive pulmonary disease) (HCC)    PER PT  . Diverticulitis   . Diverticulitis   . Emphysema of lung ([place])   . Esophageal dysmotility   . Fibromyalgia   . Hiatal hernia   . Hyperlipidemia   . Hypertension   . Insomnia   . Osteopenia   . UTI (lower urinary tract infection)    Past Surgical History: Past Surgical History:  Procedure Laterality Date  . ABDOMINAL HYSTERECTOMY    . BLADDER SURGERY     tac  . COLONOSCOPY     2008   Social History: Social History   Socioeconomic History  . Marital status: Widowed    Spouse name: Not on file  . Number of children: 2  . Years of education: Not on file  . Highest education level: Not on file  Occupational History  . Occupation: retired  Scientific laboratory technician  . Financial resource strain: Not on file  . Food insecurity:    Worry: Not on file    Inability: Not on file  . Transportation needs:    Medical: Not on file    Non-medical: Not on file  Tobacco Use  . Smoking status: Former Research scientist (life sciences)  . Smokeless tobacco: Never Used  .  Tobacco comment: smoker as a teenager  Substance and Sexual Activity  . Alcohol use: No    Alcohol/week: 0.0 standard drinks  . Drug use: No  . Sexual activity: Not on file  Lifestyle  . Physical activity:    Days per week: Not on file    Minutes per session: Not on file  . Stress: Not on file  Relationships  . Social connections:    Talks on phone: Not on file    Gets together: Not on file    Attends religious service: Not on file    Active member of club or organization: Not on file    Attends meetings of clubs or organizations: Not on file    Relationship status: Not on file  Other Topics Concern  . Not on file  Social History Narrative  . Not on file   Family History: Family History  Problem Relation Age of Onset  . Cancer Mother        breast  . Hyperlipidemia Mother   . Hypertension Mother   . Dementia Mother   . Heart disease Father        MI  . Depression Father   . Hyperlipidemia Father   . Colon cancer Neg Hx   .  Esophageal cancer Neg Hx   . Stomach cancer Neg Hx   . Rectal cancer Neg Hx    Allergies: Allergies  Allergen Reactions  . Doxycycline Other (See Comments)    Blisters on mouth and lips  . Pravachol [Pravastatin]     Fatigued.   . Trazodone And Nefazodone Other (See Comments)    Made [name] hyper  . Zocor [Simvastatin] Other (See Comments)    Myalgias and memory loss   Medications: See med rec.  Review of Systems: No fevers, chills, night sweats, weight loss, chest pain, or shortness of breath.   Objective:    General: Well Developed, well nourished, and in no acute distress.  Neuro: Alert and oriented x3, extra-ocular muscles intact, sensation grossly intact.  HEENT: Normocephalic, atraumatic, pupils equal round reactive to light, neck supple, no masses, no lymphadenopathy, thyroid nonpalpable.  Skin: Warm and dry, no rashes.  Small papule on the right inner thigh, no rash, no signs of bacterial superinfection. Cardiac: Regular rate and  rhythm, no murmurs rubs or gallops, no lower extremity edema.  Respiratory: Clear to auscultation bilaterally, but overall poor air movement. Not using accessory muscles, speaking in full sentences.   Impression and Recommendations:    COPD, mild ([place]) Treated with antibiotics, no steroids, persistent mild cough, poor air movement. Feels a bit tired but things are heading in the right direction. Adding a chest x-ray, return to see me in 2 weeks, we will do a full course of steroids, and another course of antibiotics if no better. I do not think we need to do a LABA/LAMA at this point.  Tick bite Occurred about 3 weeks ago, no rash, no fevers, chills, muscle aches, body aches. No evidence of bacterial infection, no intervention needed.  I spent 40 minutes with this patient, greater than 50% was face-to-face time counseling regarding the above diagnoses ___________________________________________ Beth [name]. Dianah Field, M.D., ABFM., CAQSM. Primary Care and [place] Instructor of [place] of [facility]

## 2017-12-17 ENCOUNTER — Ambulatory Visit: Payer: Medicare HMO | Admitting: Sports Medicine

## 2017-12-23 DIAGNOSIS — M9905 Segmental and somatic dysfunction of pelvic region: Secondary | ICD-10-CM | POA: Diagnosis not present

## 2017-12-23 DIAGNOSIS — M4316 Spondylolisthesis, lumbar region: Secondary | ICD-10-CM | POA: Diagnosis not present

## 2017-12-23 DIAGNOSIS — M9903 Segmental and somatic dysfunction of lumbar region: Secondary | ICD-10-CM | POA: Diagnosis not present

## 2017-12-23 DIAGNOSIS — M25552 Pain in left hip: Secondary | ICD-10-CM | POA: Diagnosis not present

## 2017-12-24 DIAGNOSIS — M9903 Segmental and somatic dysfunction of lumbar region: Secondary | ICD-10-CM | POA: Diagnosis not present

## 2017-12-24 DIAGNOSIS — M25552 Pain in left hip: Secondary | ICD-10-CM | POA: Diagnosis not present

## 2017-12-24 DIAGNOSIS — M4316 Spondylolisthesis, lumbar region: Secondary | ICD-10-CM | POA: Diagnosis not present

## 2017-12-24 DIAGNOSIS — M9905 Segmental and somatic dysfunction of pelvic region: Secondary | ICD-10-CM | POA: Diagnosis not present
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
## 2018-02-04 ENCOUNTER — Ambulatory Visit (INDEPENDENT_AMBULATORY_CARE_PROVIDER_SITE_OTHER): Payer: Medicare HMO | Admitting: Family Medicine

## 2018-02-04 ENCOUNTER — Encounter: Payer: Self-pay | Admitting: Family Medicine

## 2018-02-04 VITALS — BP 138/70 | HR 67 | Ht 65.0 in | Wt 156.0 lb

## 2018-02-04 DIAGNOSIS — I10 Essential (primary) hypertension: Secondary | ICD-10-CM

## 2018-02-04 DIAGNOSIS — M5432 Sciatica, left side: Secondary | ICD-10-CM

## 2018-02-04 DIAGNOSIS — F411 Generalized anxiety disorder: Secondary | ICD-10-CM

## 2018-02-04 DIAGNOSIS — Z23 Encounter for immunization: Secondary | ICD-10-CM | POA: Diagnosis not present

## 2018-02-04 DIAGNOSIS — J449 Chronic obstructive pulmonary disease, unspecified: Secondary | ICD-10-CM | POA: Diagnosis not present

## 2018-02-04 DIAGNOSIS — R69 Illness, unspecified: Secondary | ICD-10-CM | POA: Diagnosis not present

## 2018-02-04 DIAGNOSIS — R413 Other amnesia: Secondary | ICD-10-CM | POA: Diagnosis not present

## 2018-02-04 LAB — COMPLETE METABOLIC PANEL WITH GFR
AG RATIO: 1.6 (calc) (ref 1.0–2.5)
ALKALINE PHOSPHATASE (APISO): 61 U/L (ref 33–130)
ALT: 12 U/L (ref 6–29)
AST: 19 U/L (ref 10–35)
Albumin: 4.2 g/dL (ref 3.6–5.1)
BILIRUBIN TOTAL: 0.5 mg/dL (ref 0.2–1.2)
BUN: 8 mg/dL (ref 7–25)
CO2: 31 mmol/L (ref 20–32)
Calcium: 9.7 mg/dL (ref 8.6–10.4)
Chloride: 99 mmol/L (ref 98–110)
Creat: 0.63 mg/dL (ref 0.60–0.88)
GFR, Est African American: 96 mL/min/{1.73_m2} (ref >=60)
GFR, Est Non African American: 83 mL/min/{1.73_m2} (ref >=60)
GLOBULIN: 2.7 g/dL (ref 1.9–3.7)
Glucose, Bld: 95 mg/dL (ref 65–99)
Potassium: 4.4 mmol/L (ref 3.5–5.3)
SODIUM: 136 mmol/L (ref 135–146)
Total Protein: 6.9 g/dL (ref 6.1–8.1)

## 2018-02-04 LAB — LIPID PANEL
CHOL/HDL RATIO: 5 (calc) — AB (ref <5.0)
CHOLESTEROL: 226 mg/dL — AB (ref <200)
HDL: 45 mg/dL — AB (ref >50)
LDL CHOLESTEROL (CALC): 154 mg/dL — AB
Non-HDL Cholesterol (Calc): 181 mg/dL (calc) — ABNORMAL HIGH (ref <130)
TRIGLYCERIDES: 147 mg/dL (ref <150)

## 2018-02-08 ENCOUNTER — Other Ambulatory Visit: Payer: Self-pay | Admitting: Family Medicine

## 2018-02-08 MED ORDER — ATORVASTATIN CALCIUM 10 MG PO TABS: 10.0000 mg | ORAL_TABLET | Freq: Every day | ORAL | 1 refills | Status: DC

## 2018-02-08 NOTE — Addendum Note (Signed)
Addended by: Beatrice Lecher D on: 02/08/2018 04:35 PM   Modules accepted: Orders

## 2018-02-23 ENCOUNTER — Other Ambulatory Visit: Payer: Self-pay | Admitting: Family Medicine

## 2018-03-30 ENCOUNTER — Other Ambulatory Visit: Payer: Self-pay | Admitting: Pulmonary Disease

## 2018-03-30 ENCOUNTER — Encounter: Payer: Self-pay | Admitting: Pulmonary Disease

## 2018-03-30 ENCOUNTER — Ambulatory Visit: Payer: Medicare Other | Admitting: Pulmonary Disease

## 2018-03-30 VITALS — BP 130/78 | HR 84 | Temp 97.8°F | Ht 65.0 in | Wt 155.2 lb

## 2018-03-30 DIAGNOSIS — K449 Diaphragmatic hernia without obstruction or gangrene: Secondary | ICD-10-CM | POA: Diagnosis not present

## 2018-03-30 DIAGNOSIS — K219 Gastro-esophageal reflux disease without esophagitis: Secondary | ICD-10-CM | POA: Diagnosis not present

## 2018-03-30 DIAGNOSIS — R413 Other amnesia: Secondary | ICD-10-CM

## 2018-03-30 DIAGNOSIS — J683 Other acute and subacute respiratory conditions due to chemicals, gases, fumes and vapors: Secondary | ICD-10-CM | POA: Insufficient documentation

## 2018-03-30 DIAGNOSIS — F411 Generalized anxiety disorder: Secondary | ICD-10-CM

## 2018-03-30 DIAGNOSIS — R69 Illness, unspecified: Secondary | ICD-10-CM | POA: Diagnosis not present

## 2018-03-30 MED ORDER — CLONAZEPAM 0.5 MG PO TABS: 0.5000 mg | ORAL_TABLET | Freq: Two times a day (BID) | ORAL | 3 refills | Status: DC

## 2018-03-30 NOTE — Patient Instructions (Signed)
Today we updated your med list in our EPIC system...    Continue your current medications the same...  We discussed my up-coming retirement & we will ask Dr. Madilyn Fireman to refill your meds going forward...  If you need a pulmonary physician in the future- I recommend Dr. Rodman Pickle in our office...   Anayely,  It has been my honor to have been one of your doctors over these many yrs...    Wishing you good health & much happiness in the years to come!!!

## 2018-04-05 ENCOUNTER — Encounter: Payer: Self-pay | Admitting: Pulmonary Disease

## 2018-04-05 NOTE — Progress Notes (Signed)
Subjective:     Patient ID: Beth [name], female   DOB: 18-Oct-1932, 84 y.o.   MRN: [id number]  HPI ~  October 23, 2014:  Initial consult by SN>        46 y/o WF, referred by Dr. Beatrice [name] [facility] in [place]), for a pulmonary evaluation>  Beth [name] relates a circuitous hx starting ~12/2013 when she felt bad & went to the ER for eval, diagnosed w/ pneumonia (RLL) & was Hosp x 3d;  Records reviewed in EPIC> CAP (nos), COPD (?asthma ?bronchiectasis), HBP, HH, Divertics, HA, anxiety/depression;  Treated w/ Rocephin & Zithromax, then changed to Levaquin at disch also w/ an AlbutHFA rescue inhaler for prn use, Tussionex & Mucinex;  Since then she has noted upper resp symptoms w/ some hoarseness, cough, prob LPR & reflux w/ throat tightness; she notes that she will occas choke on water;  otherw she gives a long hx w/ mult somatic complaints- pain in ribs and back, discomfort worse w/ movement etc, note hx fibromyalgia...       Beth [name] is essentially a never-smoker having smoked some in her teens- from 89 to 77 then quit & never restarted;  She tells me she had pneumonia as a child, then several bouts as an adult (under the care of Beth [name]) but no known sequelae;  She was never told to have Asthma, and heard about poss COPD for the 1st time last yr;  She does recall "allergies" but never formally tested...  She denies any occupational exposures and FamHx is NEG for lung diseases...       Current Meds> Breo one puff daily, NEBS w/ Duoneb but not using regularly due to some jitteriness...  EXAM reveals Afeb, VSS, O2sat=94% on RA;  HEENT- neg, mallampati1;  Chest- essentially clear w/o w/r/r;  Heart- RR w/o m/r/g;  Abd- soft, neg;  Ext- neg w/o c/c/e...  2DEcho 11/12 showed mild LVH, norm LVF w/ EF=60%, no regional wall motion abn, no cardiac source of embolism found...  Spirometry 7/14 showed FVC=2.21 (88%), FEV1=1.32 (71%), %1sec=60, mid-flows were reduced at 49% predicted... c/w mod airflow  obstruction.  EKG 9/15 showed NSR, rate100, PACs, NSSTTWA, NAD...  CT Abd&Pelvis 9/15 showed patchy airsp dis in RLL w/ focal bronchial wall thickening, mod sized sliding HH, plus 21mm left renal cyst/ s/p hyst/ extensive colonic divertics, diffuse DDD...  Spirometry 1/16 showed FVC=2.22 (87%), FEV1=1.71 (91%), %1sec=77, mid-flows were wnl at 110% predicted... This study is WNL.Marland KitchenMarland Kitchen  CXR 4/16 showed borderline cardiomeg. right mid lung scarring, some hyperinflation, kyphosis, NAD...   LABS EPIC review>  CBC- wnl x eos=10%;  Chems- wnl;    IMP/PLAN>>  Beth [name] had some evid of airflow obstruction on Spirometry in 2014 but improved (a reversible asthmatic component) on her more recent study;  She has been on the [facility] & appears improved;  Her current symptoms sound more like LPR, reflux-related w/ throat tightness etc;  We discussed a good antireflux regimen w/ PPI about 30 min before dinner, NPO after dinner, elev HOB on 6" blocks, etc;  In addition I would like to try her on a "combination relaxer" to help her throat tightness AND help her rest better at night- KLONOPIN 0.5mg  bid should be beneficial... I offered to switch her Duoneb to Xopenex in hopes of decreasing any jitteriness for the beta agonist but she prefers to keep these meds the same for now... REC an ROV recheck in [place]...   ~  November 21, 2014:  40mo  ROV & Beth [name] reports that she is improved on the Klonopin Rx and the antireflux regimen; she specifically notes feeling rested, alert, feeling well now; she still reports some jitteriness after the Nebulizer w/ Duoneb & I again offered to change to Xopenex but she wants to save $$ therefore try using 1/2 of the vial; she requests VentolinHFA refill- she tells me she stopped the Prilosec, Amitrip, and Breo on her own "I don't like it"; we reviewed her CT w/ mod sliding HH and the need for PPI & antireflux regimen (we reviewed this again)...  EXAM reveals Afeb, VSS, O2sat=95% on RA;  HEENT- neg,  mallampati1;  Chest- essentially clear w/o w/r/r;  Heart- RR w/o m/r/g;  Abd- soft, neg;  Ext- neg w/o c/c/e... IMP/PLAN>>  Beth [name]'s symptoms are improved w/ a combination of Klonopin & Antireflux regimen- we reviewed this treatment & directions for continuing these going forward & adjusting the doses; she will call for any breathing problems and plan recheck in 6 months...   ~  May 29, 2015:  7mo ROV & Beth [name] is 84 y/o now- PCP is Beth [name] & Beth [name] in [place] & she is checked monthly (notes reviewed in Epic)- last 04/06/15 for dysuria, UTI (Rx Beth [name])... Today she is c/o recurrent UTI symptoms- she had urethral sling surg for stress incont yrs ago by Beth [name], hx recurrent UTIs; c/o freq/ urgency/ urge incont/ known cystocele, occas constipation=> felt to have OB, she did not want additional meds, given exercise training & f/u Urology...    Her breathing is OK, resting well, using NEB w/ 1/2 vial duoneb ave once per day, also has ProairHFA rescue inhaler (hasn't needed); she is taking the Klonopin 0.5mg Bid, and following the antireflux regimen w/ Prilosec20/d, & doesn't eat or drink much after dinner (she has not elev the [name] 6" as requested)- recall that she has a modHH on CT Chest... we reviewed the following medical problems during today's office visit >>     Reported mild-mod airflow obstruction w/ an apparent reversible component> she has a min smoking hx & therefore not likely COPD so I favor RADS- this fits w/ her hx of trouble since 9/15 bout of pneumonia;  She was on a good regimen w/ BREO (she subseq stopped on her own), & Albuterol via rescue inhaler or NEB w/ Duoneb ~1/2 vial in NEB prn...    Hiatus Hernia w/ GERD & LPR> on Prilosec20, and add vigorous antireflux regimen.    Anxiety & throat tightness-- improved on Klonopin 0.5mg  bid. EXAM reveals Afeb, VSS, O2sat=97% on RA;  HEENT- neg, mallampati1;  Chest- essentially clear w/o w/r/r;  Heart- RR w/o m/r/g;  Abd- soft, neg;  Ext- neg  w/o c/c/e...  LABS 01/2015 in Epic>  Chems- wnl;  CBC- wnl;  TSH=1.74 IMP/PLAN>>  Beth [name] is stable from the pulm standpoint on her NEB, rescue inhaler prn, and Klonopin 0.5Bid;  She has HH & LPR & needs more vigorously applied antireflux regimen=> Prilosec before dinner, NPO after dinner, Elevate HOB 6", etc... Needs better exercise program & she will take advantage of gym at church;  She will also f/u w/ Uroogy, Beth [name]...  ~  December 13, 2015:  43mo ROV & Beth [name] indicates that she's doing alright overall but has several minor somatic compliants- eg states that she stays cold all the time (Labs 10/2015 all wnl including TSH), unable to sleep due to cold feet (pulses ok, no claudication, rec to wear socks Qhs), notes a runny nose (no help w/ OTC  meds- offered astelin rx)), and carafate pill gags her when she takes it (rec to dissolve in water first);  She notes that he breathing is doing well- denies cough, sput, SOB, CP, palpit, f/c/s, etc;  She is not currently taking the Klonopin & we discussed how this can help her rest at night & help w/ the sensation of dyspnea during the day...    La continues to f/u w/ PCP- Beth [name], & seen 11/06/15 w/ another UTI, treated for vaginitis w/ flagyl...     She has also been followed for GI by DrPyrtle> hx IBS, severe divertics, constipation, HH/ GERD- s/p colonoscopy 07/2015 revealed a rectal ulcer, bx=benign, & some narrowing of the left colon due to severe divertics;  C/o dysphagia & has a known large HH- EGD done 10/23/15 showed tortuous esoph w/ dysmotility, 7cm HH, mild schatzski ring- dilated, 2 cameron erosions, stomach & duodenum ok;  Treated w/ Prilosec & Carafate...     Reported mild-mod airflow obstruction w/ an apparent reversible component> she has a min smoking hx & therefore not likely COPD so I favor RADS- this fits w/ her hx of trouble since 9/15 bout of pneumonia;  She was on a good regimen w/ BREO (she subseq stopped on her own), & Albuterol via  rescue inhaler or NEB w/ Duoneb ~1/2 vial in NEB prn...    Hiatus Hernia w/ GERD & LPR> on Prilosec40 + Carafate from DrPyrtle, and we reviewed vigorous antireflux regimen.    Anxiety & throat tightness-- improved on Klonopin 0.5mg  bid which she is just using prn now... EXAM reveals Afeb, VSS, O2sat=94% on RA;  HEENT- neg, mallampati1;  Chest- essentially clear w/o w/r/r;  Heart- RR w/o m/r/g;  Abd- soft, non-tender, neg;  Ext- neg w/o c/c/e; Neuro- intact w/o focal abn...  CXR 11/06/15>  Borderline heart size, clear lungs w/o airsp dis, DJD Tspine, no change from old films...   2DEcho 11/14/15>  Mild conc LVH, norm LVF w/ EF=55-60%, no regional wall motion abn, norm diastolic parameters, AoV & MV essentially wnl (min MR), mild LAdil (32mm), RV & PAsys were wnl...  LABS 10/2015>  Chems- wnl;  CBC- wnl w/ Hg=12.3;  TSH=2.27 IMP/PLAN>>  Beth [name] is stable from the pulm standpoint- continue prn NEB vs rescue inhaler; advised to stay active etc;  She has signif GI issues managed by DrPyrtle, and GU problems managed by PCP & Beth [name];  Mult minor somatic complaints noted, she has trouble w/ saliency, advised physical activity & mental acuity puzzles etc...   ~  March 30, 2017:  66mo ROV & pulm recheck- Pt followed for RADS on AlbutHFA prn, HH w/ GERD & LPR, Anxiety & throat tightness on Klonopin;  Her PCP is DrMetheney ([facility])... Pt indicates that she's been having a "rough time" for several weeks c/o acid reflux w/ "stuff in my throat at night when supine, not resting, and mult somatic complaints including- memory worse "I have dementia, I feel different, my personality is different" and she describes several deaths in the family & friends recently; she notes that she heats w/ oil "you can smell it"; notes min cough/ sput, no hemoptysis, SOB/DOE w/ housework or walking but she is too sedentary...     She saw PCP- DrMetheney on 02/04/17> note reviewed, HBP, GAD, no resp exacerbations; felt to be  stable & no changes made... No mention made of a UTI treated at [facility]gent [facility] in [place] 09/2016, or fall w/ fx left radial head 6/018 eval at [facility] ER  09/2016...    She is followed by [facility]- DrPyrtle & last seen 09/10/16>  GERD w/ large HH & hc cameron's erosions, IBS, divertics/ constip/ & hx rectal ulcer; said she was doing well, note reviewed, EGD/ colon in 2017- EGD pertinent findings included large 7 cm hiatal hernia, Cameron's erosions and Schatzki's ring dilated to 15.5 mm; Colonoscopy pertinent findings included solitary rectal ulcer and severe diverticulosis; Rx w/ Prilosec, antireflux regimen, Miralax vs MOM for her constip... We reviewed the following medical problems during today's office visit>      Reported mild-mod airflow obstruction w/ an apparent reversible component> she has a min smoking hx & therefore not likely COPD so I favor RADS- this fits w/ her hx of trouble since 9/15 bout of pneumonia;  She was on a good regimen w/ BREO (she subseq stopped on her own), & Albuterol via rescue inhaler or NEB w/ Duoneb ~1/2 vial in NEB prn...    Hiatus Hernia w/ GERD & LPR> on Prilosec40 & off Carafate; followed by DrPyrtle, and we reviewed vigorous antireflux regimen.    Anxiety & throat tightness-- improved on Klonopin 0.5mg  bid which she is just using prn now... EXAM reveals Afeb, VSS, O2sat=96% on RA;  HEENT- neg, mallampati1;  Chest- essentially clear w/o w/r/r;  Heart- RR w/o m/r/g;  Abd- soft, non-tender, neg;  Ext- neg w/o c/c/e; Neuro- intact w/o focal abn...  LABS 01/2017 in Epic>  Chems- wnl w/ BS=83, Cr=0.71, LFTs wnl;  FLP- not at goals w/ TChol=209, HDL=43, LDL=134, TG=180... IMP/PLAN>>  Beth [name] is followed by DrMetheney for PCP, DrPyrtle for GI;  He has hx RADS w/o any resp exac over the last yr on prn Albut inhaler- ok to continue same; she has large HH & GERD w/ LPR & needs a vigorously applies antireflux regimen & we reviewed taking PRILOSEC40 about 30 min before the eve meal, NPO  after dinner, elev HOB 6 inches;  She will continue her [place] for anxiety;  Asked to call prn any breathing problems...    ~  March 30, 2018:  87yr ROV and pulm/medical follow up visit>  Beth [name] is 84 y/o now        Past Medical History  Diagnosis Date  . Hypertension >> on ToprolXL 25mg /d...   . Hyperlipidemia >> on diet alone...   . Anxiety      Situational >> on Zoloft 50mg /d...  . Insomnia    Mod sliding HH on CT scan >> rec to take Prilosec20 before dinner & antireflux regimen   . Diverticulitis >> on Miralax   . Arthritis   . Fibromyalgia >> off Elavil25 now as the Klonopin works better she says   . Asthma >> on VentolinHFA vs NEB w/ Duoneb prn   . COPD (chronic obstructive pulmonary disease) => Spirometry 04/2014 was wnl     Dyspnea >> improved w/ Klonopin 0.5- 1/2 to 1 Bid    Past Surgical History:  Procedure Laterality Date  . ABDOMINAL HYSTERECTOMY    . BLADDER SURGERY     tac  . COLONOSCOPY     2008    Outpatient Encounter Medications as of 03/30/2018  Medication Sig  . albuterol (PROVENTIL HFA;VENTOLIN HFA) 108 (90 Base) MCG/ACT inhaler Inhale 2 puffs into the lungs every 6 (six) hours as needed.  Marland Kitchen atorvastatin (LIPITOR) 10 MG tablet Take 1 tablet (10 mg total) by mouth at bedtime.  . calcium carbonate 1250 MG capsule Take 1,250 mg by mouth daily.   . Cholecalciferol (  VITAMIN D3) 1000 UNITS tablet Take 1,000 Units by mouth daily.    . cyclobenzaprine (FLEXERIL) 10 MG tablet TAKE 1 TABLET BY MOUTH AT BEDTIME AS NEEDED FOR MUSCLE SPASMS  . fexofenadine (ALLEGRA) 180 MG tablet Take 180 mg by mouth daily.  Marland Kitchen ibuprofen (ADVIL,MOTRIN) 200 MG tablet Take 200 mg by mouth every 6 (six) hours as needed.  Marland Kitchen MAGNESIUM PO Take by mouth.  . metoprolol succinate (TOPROL-XL) 25 MG 24 hr tablet TAKE 1 TABLET BY MOUTH ONCE DAILY  . Multiple Vitamin (MULTIVITAMIN) tablet Take 1 tablet by mouth daily.    Marland Kitchen omeprazole (PRILOSEC) 40 MG capsule OPEN ONE CAPSULE AND SPRINKLE ON  APPLESAUCE AND TAKE BY MOUTH ONCE DAILY. WAIT 30 MINUTES BEFORE EATING MEAL.  Marland Kitchen Pyridoxine HCl (VITAMIN B-6) 500 MG tablet Take 500 mg by mouth daily.    . sertraline (ZOLOFT) 50 MG tablet Take 1 tablet (50 mg total) by mouth daily.  . vitamin B-12 (CYANOCOBALAMIN) 1000 MCG tablet Take 1,000 mcg by mouth daily.    . [DISCONTINUED] clonazePAM (KLONOPIN) 0.5 MG tablet TAKE 1 TABLET BY MOUTH TWICE DAILY  . [DISCONTINUED] clonazePAM (KLONOPIN) 0.5 MG tablet Take 1 tablet (0.5 mg total) by mouth 2 (two) times daily.   No facility-administered encounter medications on file as of 03/30/2018.     Allergies  Allergen Reactions  . Doxycycline Other (See Comments)    Blisters on mouth and lips  . Pravachol [Pravastatin]     Fatigued.   . Trazodone And Nefazodone Other (See Comments)    Made her hyper  . Zocor [Simvastatin] Other (See Comments)    Myalgias and memory loss    Immunization History  Administered Date(s) Administered  . Influenza Split 02/18/2012  . Influenza Whole 05/04/2009  . Influenza, High Dose Seasonal PF 02/04/2017, 02/04/2018  . Influenza,inj,Quad PF,6+ Mos 02/25/2013, 05/30/2015, 12/27/2015  . Influenza-Unspecified 02/21/2014  . Pneumococcal Conjugate-13 07/17/2016  . Pneumococcal Polysaccharide-23 07/03/2004  . Tdap 02/18/2011  . Zoster 02/21/2013    .Current Medications, Allergies, Past Medical History, Past Surgical History, Family History, and Social History were reviewed in [employer] record.   Review of Systems            All symptoms NEG except where BOLDED >>  Constitutional:  F/C/S, fatigue, anorexia, unexpected weight change. HEENT:  HA, visual changes, hearing loss, earache, nasal symptoms, sore throat, mouth sores, hoarseness. Resp:  cough, sputum, hemoptysis; SOB, tightness, wheezing. Cardio:  CP, palpit, DOE, orthopnea, edema. GI:  N/V/D/C, blood in stool; reflux, abd pain, distention, gas. GU:  dysuria, freq, urgency,  hematuria, flank pain, voiding difficulty. MS:  joint pain, swelling, tenderness, decr ROM; neck pain, back pain, etc. Neuro:  HA, tremors, seizures, dizziness, syncope, weakness, numbness, gait abn. Skin:  suspicious lesions or skin rash. Heme:  adenopathy, bruising, bleeding. Psyche:  confusion, agitation, sleep disturbance, hallucinations, anxiety, depression suicidal.   Objective:   Physical Exam      Vital Signs:  Reviewed...   General:  WD, WN, 84 y/o WF in NAD; alert & oriented; pleasant & cooperative... HEENT:  [place]/AT; Conjunctiva- pink, Sclera- nonicteric, EOM-wnl, PERRLA, EACs-clear, TMs-wnl; NOSE-clear; THROAT- sl red w/o lesions. Neck:  Supple w/ fair ROM; no JVD; normal carotid impulses w/o bruits; no thyromegaly or nodules palpated; no lymphadenopathy.  Chest:  Clear to P & A; without wheezes, rales, or rhonchi heard. Heart:  Regular Rhythm; norm S1 & S2 without murmurs, rubs, or gallops detected. Abdomen:  Soft & nontender- no  guarding or rebound; normal bowel sounds; no organomegaly or masses palpated. Ext:  decrROM; no deformities or arthritic changes; no varicose veins, venous insuffic, or edema;  Pulses intact w/o bruits. Neuro:  No focal neuro deficits; gait normal & balance OK. Derm:  No lesions noted; no rash etc. Lymph:  No cervical, supraclavicular, axillary, or inguinal adenopathy palpated.   Assessment:      IMP >>  Reported mild-mod airflow obstruction w/ an apparent reversible component> she didn't smoke hardly at all & therefore not likely COPD so I favor RADS-  she was on a good regimen w/ BREO, & Albuterol via rescue inhaler or NEB, she stopped the Breo on her own & appears to be doing satis on the Albut prn... Hiatus Hernia w/ GERD & LPR> on Prilosec40 + carafate per DrPyrtle, and we reviewed vigorous antireflux regimen. Anxiety & throat tightness-- improved on Klonopin 0.5mg  bid in the past, now just using this med prn...  PLAN >>  11/21/14>   Beth [name]'s  symptoms are improved w/ a combination of Klonopin & Antireflux regimen- we reviewed this treatment & directions for continuing these going forward & adjusting the doses; she will call for any breathing problems and plan recheck in 6 months 05/29/15>   Beth [name] is stable from the pulm standpoint on her NEB, rescue inhaler prn, and Klonopin 0.5Bid;  She has HH & LPR & needs more vigorously applied antireflux regimen=> Prilosec before dinner, NPO after dinner, Elevate HOB 6", etc... Needs better exercise program & she will take advantage of gym at church;  She will also f/u w/ Uroogy, Beth [name]. 12/13/15>   Beth [name] is stable from the pulm standpoint- continue prn NEB vs rescue inhaler; advised to stay active etc;  She has signif GI issues managed by DrPyrtle, and GU problems managed by PCP & Beth [name];  Mult minor somatic complaints noted, she has trouble w/ saliency, advised physical activity & mental acuity puzzles etc 03/30/17>   Beth [name] is followed by DrMetheney for PCP, DrPyrtle for GI;  He has hx RADS w/o any resp exac over the last yr on prn Albut inhaler- ok to continue same; she has large HH & GERD w/ LPR & needs a vigorously applies antireflux regimen & we reviewed taking PRILOSEC40 about 30 min before the eve meal, NPO after dinner, elev HOB 6 inches;  She will continue her [address] for anxiety;  Asked to call prn any breathing problems     Plan:     Patient's Medications  New Prescriptions   No medications on file  Previous Medications   ALBUTEROL (PROVENTIL HFA;VENTOLIN HFA) 108 (90 BASE) MCG/ACT INHALER    Inhale 2 puffs into the lungs every 6 (six) hours as needed.   ATORVASTATIN (LIPITOR) 10 MG TABLET    Take 1 tablet (10 mg total) by mouth at bedtime.   CALCIUM CARBONATE 1250 MG CAPSULE    Take 1,250 mg by mouth daily.    CHOLECALCIFEROL (VITAMIN D3) 1000 UNITS TABLET    Take 1,000 Units by mouth daily.     CYCLOBENZAPRINE (FLEXERIL) 10 MG TABLET    TAKE 1 TABLET BY MOUTH AT BEDTIME AS  NEEDED FOR MUSCLE SPASMS   FEXOFENADINE (ALLEGRA) 180 MG TABLET    Take 180 mg by mouth daily.   IBUPROFEN (ADVIL,MOTRIN) 200 MG TABLET    Take 200 mg by mouth every 6 (six) hours as needed.   MAGNESIUM PO    Take by mouth.   METOPROLOL SUCCINATE (TOPROL-XL) 25 MG 24 HR  TABLET    TAKE 1 TABLET BY MOUTH ONCE DAILY   MULTIPLE VITAMIN (MULTIVITAMIN) TABLET    Take 1 tablet by mouth daily.     OMEPRAZOLE (PRILOSEC) 40 MG CAPSULE    OPEN ONE CAPSULE AND SPRINKLE ON APPLESAUCE AND TAKE BY MOUTH ONCE DAILY. WAIT 30 MINUTES BEFORE EATING MEAL.   PYRIDOXINE HCL (VITAMIN B-6) 500 MG TABLET    Take 500 mg by mouth daily.     SERTRALINE (ZOLOFT) 50 MG TABLET    Take 1 tablet (50 mg total) by mouth daily.   VITAMIN B-12 (CYANOCOBALAMIN) 1000 MCG TABLET    Take 1,000 mcg by mouth daily.    Modified Medications   Modified Medication Previous Medication   CLONAZEPAM (KLONOPIN) 0.5 MG TABLET clonazePAM (KLONOPIN) 0.5 MG tablet      TAKE 1 TABLET BY MOUTH TWICE DAILY    TAKE 1 TABLET BY MOUTH TWICE DAILY  Discontinued Medications   No medications on file

## 2018-04-15 ENCOUNTER — Telehealth: Payer: Self-pay | Admitting: Internal Medicine

## 2018-04-15 NOTE — Telephone Encounter (Signed)
Spoke with pt and she is having problems with diarrhea alternating with constipation. States she cannot eat much and having some abd pain. Reports after she took some MOM today and got "cleaned out" the pain is gone. Pts appt rescheduled to 04/20/18@10 :45am with Ellouise Newer PA. Pt aware of appt.

## 2018-04-15 NOTE — Telephone Encounter (Signed)
Patient states she has been having abd pain and thinks it might be a diverticulitis flare up. Patient wanting advice until appt with APP Janett Billow on 1.7.2020

## 2018-04-19 ENCOUNTER — Encounter: Payer: Self-pay | Admitting: Physician Assistant

## 2018-04-19 ENCOUNTER — Ambulatory Visit: Payer: Medicare HMO | Admitting: Physician Assistant

## 2018-04-19 VITALS — BP 116/70 | HR 76 | Ht 63.0 in | Wt 155.0 lb

## 2018-04-19 DIAGNOSIS — R1032 Left lower quadrant pain: Secondary | ICD-10-CM | POA: Diagnosis not present

## 2018-04-19 DIAGNOSIS — R1013 Epigastric pain: Secondary | ICD-10-CM | POA: Diagnosis not present

## 2018-04-19 DIAGNOSIS — K5900 Constipation, unspecified: Secondary | ICD-10-CM

## 2018-04-19 MED ORDER — HYOSCYAMINE SULFATE 0.125 MG SL SUBL: 0.1250 mg | SUBLINGUAL_TABLET | SUBLINGUAL | 1 refills | Status: DC | PRN

## 2018-04-19 MED ORDER — OMEPRAZOLE 40 MG PO CPDR: 40.0000 mg | DELAYED_RELEASE_CAPSULE | Freq: Two times a day (BID) | ORAL | 3 refills | Status: DC

## 2018-04-19 NOTE — Progress Notes (Addendum)
Chief Complaint: Abdominal pain, change in bowel habits  HPI:    Beth [name] is an 84 year old female with a past medical history as listed below, known to Dr. Hilarie Fredrickson for her reflux and large hiatal hernia with history of Cameron erosions, IBS, severe diverticulosis and constipation with history of solitary rectal ulcer, who was referred to me by Hali Marry, * for a complaint of abdominal pain and change in bowel habits.      EGD and colonoscopy in 2017.  EGD with large 7 cm hiatal hernia, Cameron erosions and Schatzki's ring dilated to 15.5 mm. Colonoscopy with solitary rectal ulcer and severe diverticulosis.    09/10/2016 patient seen by Dr. Hilarie Fredrickson described feeling great with considerably less reflux after "learning how to eat".  Was using Omeprazole 40 mg every other day.  Described regular bowel movements.  Described taking a small swallow of milk of magnesia every 3 to 4 days.  At that time encouraged to use Omeprazole on a daily basis.  Continued on milk of magnesia every 3 to 4 days.  And told to use MiraLAX if milk of magnesia was not effective.    Today, patient presents to clinic and explains that over the past month or so she has had an increased difficulty with constipation and associated left lower quadrant pain.  Describes having 4-5 bowel movements a day at times but these are typically just very dry and hard to get out.  She last used milk of magnesia about 5 days ago which resulted in watery urgent diarrhea which the patient is uncomfortable with.  She did start Citrucel 3 days ago and thinks this is helping.  Does admit to drinking a lot of water throughout the day.  Describes a constant full/pressure in her left lower quadrant, apparently this increased the other night and she thought she may have diverticulitis, but it has since gotten some better and typically corresponds with full bowels.    Also describes epigastric pain when she eats.  Just restarted her Omeprazole to 40  mg daily about a month ago which seems to be helping some.    Denies fever, chills, weight loss, anorexia, nausea, vomiting, blood in her stool or symptoms that awaken her from sleep.  Past Medical History:  Diagnosis Date  . Allergy   . Anxiety    situational  . Arthritis   . Asthma   . Cataract   . COPD (chronic obstructive pulmonary disease) (HCC)    PER PT  . Diverticulitis   . Diverticulitis   . Emphysema of lung ([place])   . Esophageal dysmotility   . Fibromyalgia   . Hiatal hernia   . Hyperlipidemia   . Hypertension   . Insomnia   . Osteopenia   . UTI (lower urinary tract infection)     Past Surgical History:  Procedure Laterality Date  . ABDOMINAL HYSTERECTOMY    . BLADDER SURGERY     tac  . COLONOSCOPY     2008    Current Outpatient Medications  Medication Sig Dispense Refill  . albuterol (PROVENTIL HFA;VENTOLIN HFA) 108 (90 Base) MCG/ACT inhaler Inhale 2 puffs into the lungs every 6 (six) hours as needed. 1 Inhaler 2  . atorvastatin (LIPITOR) 10 MG tablet Take 1 tablet (10 mg total) by mouth at bedtime. 30 tablet 1  . calcium carbonate 1250 MG capsule Take 1,250 mg by mouth daily.     . Cholecalciferol (VITAMIN D3) 1000 UNITS tablet Take 1,000 Units by  mouth daily.      . clonazePAM (KLONOPIN) 0.5 MG tablet TAKE 1 TABLET BY MOUTH TWICE DAILY 60 tablet 3  . cyclobenzaprine (FLEXERIL) 10 MG tablet TAKE 1 TABLET BY MOUTH AT BEDTIME AS NEEDED FOR MUSCLE SPASMS 20 tablet 0  . fexofenadine (ALLEGRA) 180 MG tablet Take 180 mg by mouth daily.    Marland Kitchen ibuprofen (ADVIL,MOTRIN) 200 MG tablet Take 200 mg by mouth every 6 (six) hours as needed.    Marland Kitchen MAGNESIUM PO Take by mouth.    . metoprolol succinate (TOPROL-XL) 25 MG 24 hr tablet TAKE 1 TABLET BY MOUTH ONCE DAILY 90 tablet 1  . Multiple Vitamin (MULTIVITAMIN) tablet Take 1 tablet by mouth daily.      Marland Kitchen omeprazole (PRILOSEC) 40 MG capsule OPEN ONE CAPSULE AND SPRINKLE ON APPLESAUCE AND TAKE BY MOUTH ONCE DAILY. WAIT 30  MINUTES BEFORE EATING MEAL. 30 capsule 4  . Pyridoxine HCl (VITAMIN B-6) 500 MG tablet Take 500 mg by mouth daily.      . sertraline (ZOLOFT) 50 MG tablet Take 1 tablet (50 mg total) by mouth daily. 90 tablet 1  . vitamin B-12 (CYANOCOBALAMIN) 1000 MCG tablet Take 1,000 mcg by mouth daily.       No current facility-administered medications for this visit.     Allergies as of 04/19/2018 - Review Complete 04/05/2018  Allergen Reaction Noted  . Doxycycline Other (See Comments) 05/10/2013  . Pravachol [pravastatin]  10/01/2010  . Trazodone and nefazodone Other (See Comments) 09/06/2013  . Zocor [simvastatin] Other (See Comments) 02/18/2011    Family History  Problem Relation Age of Onset  . Cancer Mother        breast  . Hyperlipidemia Mother   . Hypertension Mother   . Dementia Mother   . Heart disease Father        MI  . Depression Father   . Hyperlipidemia Father   . Colon cancer Neg Hx   . Esophageal cancer Neg Hx   . Stomach cancer Neg Hx   . Rectal cancer Neg Hx     Social History   Socioeconomic History  . Marital status: Widowed    Spouse name: Not on file  . Number of children: 2  . Years of education: Not on file  . Highest education level: Not on file  Occupational History  . Occupation: retired  Scientific laboratory technician  . Financial resource strain: Not on file  . Food insecurity:    Worry: Not on file    Inability: Not on file  . Transportation needs:    Medical: Not on file    Non-medical: Not on file  Tobacco Use  . Smoking status: Former Research scientist (life sciences)  . Smokeless tobacco: Never Used  . Tobacco comment: smoker as a teenager  Substance and Sexual Activity  . Alcohol use: No    Alcohol/week: 0.0 standard drinks  . Drug use: No  . Sexual activity: Not on file  Lifestyle  . Physical activity:    Days per week: Not on file    Minutes per session: Not on file  . Stress: Not on file  Relationships  . Social connections:    Talks on phone: Not on file    Gets  together: Not on file    Attends religious service: Not on file    Active member of club or organization: Not on file    Attends meetings of clubs or organizations: Not on file    Relationship status: Not on file  .  Intimate partner violence:    Fear of current or ex partner: Not on file    Emotionally abused: Not on file    Physically abused: Not on file    Forced sexual activity: Not on file  Other Topics Concern  . Not on file  Social History Narrative  . Not on file    Review of Systems:    Constitutional: No weight loss, fever or chills Cardiovascular: No chest pain Respiratory: No SOB or cough Gastrointestinal: See HPI and otherwise negative   Physical Exam:  Vital signs: BP 116/70   Pulse 76   Ht 5\' 3"  (1.6 m)   Wt 155 lb (70.3 kg)   BMI 27.46 kg/m   Constitutional:   Pleasant Elderly Caucasian female appears to be in NAD, Well developed, Well nourished, alert and cooperative Respiratory: Respirations even and unlabored. Lungs clear to auscultation bilaterally.   No wheezes, crackles, or rhonchi.  Cardiovascular: Normal S1, S2. No MRG. Regular rate and rhythm. No peripheral edema, cyanosis or pallor.  Gastrointestinal:  Soft, nondistended, nontender. No rebound or guarding. Normal bowel sounds. No appreciable masses or hepatomegaly. Rectal:  Not performed.  Psychiatric: Demonstrates good judgement and reason without abnormal affect or behaviors.  No recent labs or imaging.  Assessment: 1.  Constipation: Chronic for the patient, previously relieved with milk of magnesia every 3 to 4 days, currently not using this medication as often and describes constipation 2.  GERD/epigastric pain: History of reflux, typically controlled with antireflux measures, most recently helped some by Omeprazole 40 mg daily  Plan: 1.  Offered patient a bowel purge with 1 of our preps but she declined. 2.  Would recommend patient start using MiraLAX twice daily, once in the morning and  again before dinner. 3.  Increased Omeprazole to 40 mg twice daily, 30-60 minutes for breakfast and dinner.  She should continue this for the next 1 to 2 months. 4.  Prescribed Hyoscyamine sulfate 0.125 mg every 4 hours as needed for severe cramping pain #15 with 1 refill.  Did discuss with the patient that she should not use this on a regular basis as it can increase constipation, this should only be used for severe cramping pain, such as she described a couple of nights ago. 5.  Explained to patient that she should call her clinic at the end of the week if she is not feeling some better. 6.  Patient to follow in clinic with Dr. Hilarie Fredrickson or myself in the next 1 to 2 months.  Beth Newer, PA-C [facility] 04/19/2018, 2:47 PM  Cc: Hali Marry, *   Addendum: Reviewed and agree with assessment and management plan. Pyrtle, Lajuan Lines, MD

## 2018-04-19 NOTE — Patient Instructions (Addendum)
If you are age 82 or older, your body mass index should be between 23-30. Your Body mass index is 27.46 kg/m. If this is out of the aforementioned range listed, please consider follow up with your Primary Care Provider.    Increase Omeprazole to 40mg  twice daily 30-60 minutes before breakfast and dinner.    Start Hyoscyamine 0.125mg  every 4 hours as needed for pain.    Start Miralax twice daily. ( In the morning and before dinner).    Please follow up with Beth Newer, PA May 11, 2018 at 2:00pm.   Thank you.

## 2018-04-20 ENCOUNTER — Ambulatory Visit: Payer: Medicare HMO | Admitting: Physician Assistant

## 2018-05-04 ENCOUNTER — Ambulatory Visit: Payer: Medicare HMO | Admitting: Gastroenterology

## 2018-05-06 DIAGNOSIS — N3000 Acute cystitis without hematuria: Secondary | ICD-10-CM | POA: Diagnosis not present

## 2018-05-06 DIAGNOSIS — N952 Postmenopausal atrophic vaginitis: Secondary | ICD-10-CM | POA: Diagnosis not present

## 2018-05-06 DIAGNOSIS — N3941 Urge incontinence: Secondary | ICD-10-CM | POA: Diagnosis not present

## 2018-05-11 ENCOUNTER — Ambulatory Visit: Payer: Medicare HMO | Admitting: Physician Assistant

## 2018-05-15 ENCOUNTER — Other Ambulatory Visit: Payer: Self-pay | Admitting: Family Medicine

## 2018-05-15 DIAGNOSIS — F411 Generalized anxiety disorder: Secondary | ICD-10-CM

## 2018-05-20 DIAGNOSIS — N390 Urinary tract infection, site not specified: Secondary | ICD-10-CM | POA: Diagnosis not present

## 2018-05-20 DIAGNOSIS — N8111 Cystocele, midline: Secondary | ICD-10-CM | POA: Diagnosis not present

## 2018-05-20 DIAGNOSIS — N3281 Overactive bladder: Secondary | ICD-10-CM | POA: Diagnosis not present

## 2018-05-20 DIAGNOSIS — N3941 Urge incontinence: Secondary | ICD-10-CM | POA: Diagnosis not present

## 2018-05-20 DIAGNOSIS — N952 Postmenopausal atrophic vaginitis: Secondary | ICD-10-CM | POA: Diagnosis not present

## 2018-07-06 ENCOUNTER — Telehealth: Payer: Self-pay

## 2018-07-06 NOTE — Telephone Encounter (Signed)
Called patient and scheduled an appointment. No further questions at this time.

## 2018-07-06 NOTE — Telephone Encounter (Signed)
Beth [name] called and cancelled her appointment. Please call patient to reschedule. Thanks

## 2018-08-03 ENCOUNTER — Ambulatory Visit: Payer: Medicare HMO | Admitting: Family Medicine

## 2018-08-11 ENCOUNTER — Other Ambulatory Visit: Payer: Self-pay | Admitting: Family Medicine

## 2018-09-06 DIAGNOSIS — N8111 Cystocele, midline: Secondary | ICD-10-CM | POA: Diagnosis not present

## 2018-09-06 DIAGNOSIS — N390 Urinary tract infection, site not specified: Secondary | ICD-10-CM | POA: Diagnosis not present

## 2018-09-06 DIAGNOSIS — N952 Postmenopausal atrophic vaginitis: Secondary | ICD-10-CM | POA: Diagnosis not present

## 2018-09-08 ENCOUNTER — Other Ambulatory Visit: Payer: Self-pay | Admitting: Pulmonary Disease

## 2018-09-09 DIAGNOSIS — Z961 Presence of intraocular lens: Secondary | ICD-10-CM | POA: Diagnosis not present

## 2018-09-09 DIAGNOSIS — H1851 Endothelial corneal dystrophy: Secondary | ICD-10-CM | POA: Diagnosis not present

## 2018-09-09 DIAGNOSIS — H353131 Nonexudative age-related macular degeneration, bilateral, early dry stage: Secondary | ICD-10-CM | POA: Diagnosis not present

## 2018-09-09 DIAGNOSIS — H04123 Dry eye syndrome of bilateral lacrimal glands: Secondary | ICD-10-CM | POA: Diagnosis not present

## 2018-09-09 DIAGNOSIS — H40013 Open angle with borderline findings, low risk, bilateral: Secondary | ICD-10-CM | POA: Diagnosis not present

## 2018-09-09 DIAGNOSIS — H43812 Vitreous degeneration, left eye: Secondary | ICD-10-CM | POA: Diagnosis not present

## 2018-09-14 ENCOUNTER — Telehealth: Payer: Self-pay | Admitting: Family Medicine

## 2018-09-14 NOTE — Telephone Encounter (Signed)
I called to schedule  virtual visit. Pt states she is not sleeping and needs refill on clonazepam.. Thanks

## 2018-09-15 NOTE — Telephone Encounter (Signed)
I agree.  Has not been seen in 6 months and so also needs a follow-up on blood pressure as well.  Schedule for virtual visit.

## 2018-09-16 ENCOUNTER — Encounter: Payer: Self-pay | Admitting: Family Medicine

## 2018-09-16 ENCOUNTER — Ambulatory Visit (INDEPENDENT_AMBULATORY_CARE_PROVIDER_SITE_OTHER): Payer: Medicare HMO | Admitting: Family Medicine

## 2018-09-16 VITALS — BP 132/77 | HR 70 | Ht 65.0 in

## 2018-09-16 DIAGNOSIS — R69 Illness, unspecified: Secondary | ICD-10-CM | POA: Diagnosis not present

## 2018-09-16 DIAGNOSIS — J449 Chronic obstructive pulmonary disease, unspecified: Secondary | ICD-10-CM

## 2018-09-16 DIAGNOSIS — F411 Generalized anxiety disorder: Secondary | ICD-10-CM | POA: Diagnosis not present

## 2018-09-16 DIAGNOSIS — I10 Essential (primary) hypertension: Secondary | ICD-10-CM

## 2018-09-16 DIAGNOSIS — F430 Acute stress reaction: Secondary | ICD-10-CM | POA: Diagnosis not present

## 2018-09-16 MED ORDER — ALBUTEROL SULFATE HFA 108 (90 BASE) MCG/ACT IN AERS: 2.0000 | INHALATION_SPRAY | Freq: Four times a day (QID) | RESPIRATORY_TRACT | 2 refills | Status: DC | PRN

## 2018-09-16 MED ORDER — CLONAZEPAM 0.5 MG PO TABS: 0.5000 mg | ORAL_TABLET | Freq: Two times a day (BID) | ORAL | 3 refills | Status: DC

## 2018-09-16 NOTE — Progress Notes (Signed)
Beth [name] has been in hospital since Sunday. And has been in a lot of pain. She is very concerned about him and the fact that she is unable to get up there and see about him and COVID.  She also would like something for sleep.[name], [place]

## 2018-09-16 NOTE — Progress Notes (Signed)
Virtual Visit via Telephone Note  I connected with Beth [name] on 09/16/18 at  4:00 PM EDT by telephone and verified that I am speaking with the correct person using two identifiers.   I discussed the limitations, risks, security and privacy concerns of performing an evaluation and management service by telephone and the availability of in person appointments. I also discussed with the patient that there may be a patient responsible charge related to this service. The patient expressed understanding and agreed to proceed.   Subjective:    CC: BP check   HPI:  Beth [name] has been in hospital since Sunday. And has been in a lot of pain. She is very concerned about him and the fact that she is unable to get up there and see about him and COVID.  Hypertension- Pt denies chest pain, SOB, dizziness, or heart palpitations.  Taking meds as directed w/o problems.  Denies medication side effects.    Dr. Lenna Gilford retired and he was writing her clonazepam. She has been taking 1.5 tabs at night. She also uses for sleep.   COPD - she has noticed some wheezing at night. She has has allergies and think that I scausing it.  No Chest pain.     Past medical history, Surgical history, Family history not pertinant except as noted below, Social history, Allergies, and medications have been entered into the medical record, reviewed, and corrections made.   Review of Systems: No fevers, chills, night sweats, weight loss, chest pain, or shortness of breath.   Objective:     General: Speaking clearly in complete sentences without any shortness of breath.  Alert and oriented x3.  Normal judgment. No apparent acute distress.     Impression and Recommendations:   HTN - home BPs look good. Will continue to monitor.  F/U in 6 months.    COPD - Wheezing at night - will refill he inhaler. She has been out and says she doesn't like the nebulizer bc it makes her feel jittery so hasn't been using it.if getting worse then  please call us.    GAD - she is taking her sertraline. She is happy with her curren dose and regimen. I will take over her clonazepam rx.   Will send over her clonazepam.    She says she Is no longer taking the atorvastatin.  She says she ahd side effects and stopped it. She doesn't want to take it. Removed from her medicatino list.         I discussed the assessment and treatment plan with the patient. The patient was provided an opportunity to ask questions and all were answered. The patient agreed with the plan and demonstrated an understanding of the instructions.   The patient was advised to call back or seek an in-person evaluation if the symptoms worsen or if the condition fails to improve as anticipated.  I provided 25 minutes of non-face-to-face time during this encounter.   Beatrice Lecher, MD

## 2018-09-16 NOTE — Telephone Encounter (Signed)
She's scheduled for this afternoon at 4:00.  Thanks

## 2018-09-23 ENCOUNTER — Ambulatory Visit: Payer: Medicare HMO | Admitting: Family Medicine

## 2018-11-14 ENCOUNTER — Other Ambulatory Visit: Payer: Self-pay | Admitting: Family Medicine

## 2018-12-09 ENCOUNTER — Other Ambulatory Visit: Payer: Self-pay | Admitting: Family Medicine

## 2018-12-09 DIAGNOSIS — F411 Generalized anxiety disorder: Secondary | ICD-10-CM

## 2019-01-13 ENCOUNTER — Encounter: Payer: Self-pay | Admitting: Emergency Medicine

## 2019-01-13 ENCOUNTER — Emergency Department (INDEPENDENT_AMBULATORY_CARE_PROVIDER_SITE_OTHER): Payer: Medicare HMO

## 2019-01-13 ENCOUNTER — Emergency Department (INDEPENDENT_AMBULATORY_CARE_PROVIDER_SITE_OTHER)
Admission: EM | Admit: 2019-01-13 | Discharge: 2019-01-13 | Disposition: A | Discharge status: Home / Self Care | Payer: Medicare HMO | Source: Home / Self Care | Attending: Family Medicine | Admitting: Family Medicine

## 2019-01-13 ENCOUNTER — Other Ambulatory Visit: Payer: Self-pay

## 2019-01-13 DIAGNOSIS — M79602 Pain in left arm: Secondary | ICD-10-CM | POA: Diagnosis not present

## 2019-01-13 DIAGNOSIS — Z008 Encounter for other general examination: Secondary | ICD-10-CM | POA: Diagnosis not present

## 2019-01-13 DIAGNOSIS — M5030 Other cervical disc degeneration, unspecified cervical region: Secondary | ICD-10-CM | POA: Diagnosis not present

## 2019-01-13 DIAGNOSIS — R5383 Other fatigue: Secondary | ICD-10-CM

## 2019-01-13 DIAGNOSIS — M5031 Other cervical disc degeneration,  high cervical region: Secondary | ICD-10-CM | POA: Diagnosis not present

## 2019-01-13 MED ORDER — METHYLPREDNISOLONE ACETATE 80 MG/ML IJ SUSP
60.0000 mg | Freq: Once | INTRAMUSCULAR | Status: AC
  Administered 2019-01-13: 60 mg via INTRAMUSCULAR

## 2019-01-13 NOTE — ED Triage Notes (Signed)
Bones and muscles ache x 3weeks, a little diarrhea, denies fever.   She has been trying to get an appointment with Dr Eustaquio Boyden for weeks and nobody answers the phone.

## 2019-01-13 NOTE — ED Provider Notes (Signed)
[facility]    CSN: [id number] Arrival date & time: 01/13/19  1548      History   Chief Complaint Chief Complaint  Patient presents with  . Generalized Body Aches    HPI Beth [name] is a 85 y.o. female.   Patient presents with two complaints: 1)  Three weeks ago she developed URI symptoms with myalgias, cough/wheezing, and fatigue.  She denies fever and changes in taste/smell.  Her URI symptoms resolved but remains fatigued. 2)  She complains of persistent bilateral neck pain, worse on the left radiating down her left arm without loss of strength.  She denies recent injury.  The history is provided by the patient.    Past Medical History:  Diagnosis Date  . Allergy   . Anxiety    situational  . Arthritis   . Asthma   . Cataract   . COPD (chronic obstructive pulmonary disease) (HCC)    PER PT  . Diverticulitis   . Diverticulitis   . Emphysema of lung ([place])   . Esophageal dysmotility   . Fibromyalgia   . Hiatal hernia   . Hyperlipidemia   . Hypertension   . Insomnia   . Osteopenia   . UTI (lower urinary tract infection)     Patient Active Problem List   Diagnosis Date Noted  . Reactive airways dysfunction syndrome ([place]) 03/30/2018  . Memory impairment 02/04/2018  . Sciatica, left side 02/04/2018  . Recurrent UTI (urinary tract infection) 07/14/2017  . Cardiomegaly 11/06/2015  . History of recurrent UTIs 05/29/2015  . COPD, mild ([place]) 10/23/2014  . Laryngopharyngeal reflux (LPR) 10/23/2014  . Headache(784.0) 01/17/2014  . Generalized anxiety disorder 01/16/2014  . Insomnia 01/16/2014  . Lactose intolerance 09/06/2013  . Ataxia 03/16/2011  . HYPERLIPIDEMIA 11/13/2010  . Reaction, situational, acute, to stress 09/09/2010  . MURMUR 05/11/2009  . URINARY URGENCY 05/11/2009  . Diaphragmatic hernia 03/14/2009  . MACULAR DEGENERATION, RIGHT EYE 12/06/2008  . HYPERTENSION, BENIGN 12/06/2008  . DIVERTICULITIS, COLON 12/06/2008  . FIBROMYALGIA  12/06/2008  . OSTEOPENIA 12/06/2008    Past Surgical History:  Procedure Laterality Date  . ABDOMINAL HYSTERECTOMY    . BLADDER SURGERY     tac  . COLONOSCOPY     2008       Home Medications    Prior to Admission medications   Medication Sig Start Date End Date Taking? Authorizing Provider  calcium carbonate 1250 MG capsule Take 1,250 mg by mouth daily.     [provider]  Cholecalciferol (VITAMIN D3) 1000 UNITS tablet Take 1,000 Units by mouth daily.      [provider]  clonazePAM (KLONOPIN) 0.5 MG tablet Take 1 tablet (0.5 mg total) by mouth 2 (two) times daily. 09/16/18   Hali Marry, MD  fexofenadine (ALLEGRA) 180 MG tablet Take 180 mg by mouth daily.    [provider]  ibuprofen (ADVIL,MOTRIN) 200 MG tablet Take 200 mg by mouth every 6 (six) hours as needed.    [provider]  metoprolol succinate (TOPROL-XL) 25 MG 24 hr tablet Take 1 tablet by mouth once daily 11/15/18   Hali Marry, MD  Multiple Vitamin (MULTIVITAMIN) tablet Take 1 tablet by mouth daily.      [provider]  naproxen sodium (ALEVE) 220 MG tablet Take 220 mg by mouth as needed.    [provider]  omeprazole (PRILOSEC) 40 MG capsule Take 1 capsule (40 mg total) by mouth 2 (two) times daily  at 10 AM and 5 PM. 04/19/18   Levin Erp, PA  Pyridoxine HCl (VITAMIN B-6) 500 MG tablet Take 500 mg by mouth daily.      [provider]  sertraline (ZOLOFT) 50 MG tablet Take 1 tablet by mouth once daily 12/09/18   Hali Marry, MD  vitamin B-12 (CYANOCOBALAMIN) 1000 MCG tablet Take 1,000 mcg by mouth daily.      [provider]    Family History Family History  Problem Relation Age of Onset  . Cancer Mother        breast  . Hyperlipidemia Mother   . Hypertension Mother   . Dementia Mother   . Heart disease Father        MI  . Depression Father   . Hyperlipidemia Father   . Colon cancer Neg Hx    . Esophageal cancer Neg Hx   . Stomach cancer Neg Hx   . Rectal cancer Neg Hx     Social History Social History   Tobacco Use  . Smoking status: Former Research scientist (life sciences)  . Smokeless tobacco: Never Used  . Tobacco comment: smoker as a teenager  Substance Use Topics  . Alcohol use: No    Alcohol/week: 0.0 standard drinks  . Drug use: No     Allergies   Doxycycline, Pravachol [pravastatin], Trazodone and nefazodone, and Zocor [simvastatin]   Review of Systems Review of Systems  Constitutional: Positive for fatigue. Negative for activity change, appetite change, chills, diaphoresis, fever and unexpected weight change.  HENT: Negative.   Eyes: Negative.   Respiratory: Negative.   Cardiovascular: Negative.   Gastrointestinal: Negative.   Genitourinary: Negative.   Musculoskeletal: Positive for neck pain and neck stiffness.  Skin: Negative.   Neurological: Negative for headaches.     Physical Exam Triage Vital Signs ED Triage Vitals  Enc Vitals Group     BP 01/13/19 1606 113/68     Pulse Rate 01/13/19 1606 88     Resp --      Temp 01/13/19 1606 98.2 F (36.8 C)     Temp Source 01/13/19 1606 Oral     SpO2 01/13/19 1606 97 %     Weight 01/13/19 1609 153 lb (69.4 kg)     Height 01/13/19 1609 5\' 3"  (1.6 m)     Head Circumference --      Peak Flow --      Pain Score 01/13/19 1608 8     Pain Loc --      Pain Edu? --      Excl. in [place]? --    No data found.  Updated Vital Signs BP 113/68 (BP Location: Right Arm)   Pulse 88   Temp 98.2 F (36.8 C) (Oral)   Ht 5\' 3"  (1.6 m)   Wt 69.4 kg   SpO2 97%   BMI 27.10 kg/m   Visual Acuity Right Eye Distance:   Left Eye Distance:   Bilateral Distance:    Right Eye Near:   Left Eye Near:    Bilateral Near:     Physical Exam Vitals signs and nursing note reviewed.  Constitutional:      General: She is not in acute distress. HENT:     Head: Normocephalic.     Right Ear: Tympanic membrane normal.     Left Ear: Tympanic  membrane normal.     Nose: Nose normal.     Mouth/Throat:     Pharynx: Oropharynx is clear.  Eyes:  Conjunctiva/sclera: Conjunctivae normal.     Pupils: Pupils are equal, round, and reactive to light.  Neck:     Musculoskeletal: Neck supple. Muscular tenderness present.  Cardiovascular:     Rate and Rhythm: Normal rate.     Heart sounds: Normal heart sounds.  Pulmonary:     Breath sounds: Normal breath sounds.  Abdominal:     General: Abdomen is flat.     Tenderness: There is no abdominal tenderness.  Musculoskeletal:       Arms:     Right lower leg: No edema.     Left lower leg: No edema.     Comments: Patient has tenderness to palpation over her posterior neck and trapezius areas, worse on the left.  She admits to bilateral shoulder/neck pain when rotating her head to either side.  Lymphadenopathy:     Cervical: No cervical adenopathy.  Skin:    General: Skin is warm and dry.     Findings: No rash.  Neurological:     Mental Status: She is alert.      UC Treatments / Results  Labs (all labs ordered are listed, but only abnormal results are displayed) Labs Reviewed  COMPLETE METABOLIC PANEL WITH GFR  TSH  SEDIMENTATION RATE  POCT CBC W AUTO DIFF ([facility]):  WBC 6.7; LY 31.8; MO 3.7; GR 64.5; Hgb 13.2; Platelets 207     EKG   Radiology Dg Cervical Spine Complete  Result Date: 01/13/2019 CLINICAL DATA:  Left upper extremity pain.  No known injury. EXAM: CERVICAL SPINE - COMPLETE 4+ VIEW COMPARISON:  None. FINDINGS: Mild multilevel degenerative disc disease with small anterior osteophytes. No traumatic malalignment or fractures noted. Pre odontoid space and prevertebral soft tissues are unremarkable. The neural foramina are patent. The odontoid process is unremarkable. The lung apices are normal as well. IMPRESSION: Mild degenerative changes.  No other acute abnormalities. Electronically Signed   By: Dorise Bullion III M.D   On: 01/13/2019 17:16     Procedures Procedures (including critical care time)  Medications Ordered in UC Medications  methylPREDNISolone acetate (DEPO-MEDROL) injection 60 mg (has no administration in time range)    Initial Impression / Assessment and Plan / UC Course  I have reviewed the triage vital signs and the nursing notes.  Pertinent labs & imaging results that were available during my care of the patient were reviewed by me and considered in my medical decision making (see chart for details).    Normal CBC reassuring. CMP, sed rate, TSH pending Suspect cervical radiculopathy.  Administered Depo Medrol 60mg  IM. Recommend follow-up PCP for CPE. Followup with Dr. Aundria Mems or Dr. Lynne Leader ([facility]) if neck and arm pain not improved 2 weeks.   Final Clinical Impressions(s) / UC Diagnoses   Final diagnoses:  Fatigue, unspecified type  Arm pain, left     Discharge Instructions     Try applying ice pack to left neck for 20 to 30 minutes, 3 to 4 times daily  Continue until pain decreases.     ED Prescriptions    None        Kandra Nicolas, MD 01/16/19 1616

## 2019-01-13 NOTE — Discharge Instructions (Addendum)
Try applying ice pack to left neck for 20 to 30 minutes, 3 to 4 times daily  Continue until pain decreases.

## 2019-01-14 LAB — COMPLETE METABOLIC PANEL WITH GFR
AG Ratio: 1.8 (calc) (ref 1.0–2.5)
ALT: 13 U/L (ref 6–29)
AST: 15 U/L (ref 10–35)
Albumin: 4.4 g/dL (ref 3.6–5.1)
Alkaline phosphatase (APISO): 70 U/L (ref 37–153)
BUN: 12 mg/dL (ref 7–25)
CO2: 30 mmol/L (ref 20–32)
Calcium: 9.8 mg/dL (ref 8.6–10.4)
Chloride: 103 mmol/L (ref 98–110)
Creat: 0.71 mg/dL (ref 0.60–0.88)
GFR, Est African American: 91 mL/min/{1.73_m2} (ref >=60)
GFR, Est Non African American: 78 mL/min/{1.73_m2} (ref >=60)
Globulin: 2.5 g/dL (calc) (ref 1.9–3.7)
Glucose, Bld: 93 mg/dL (ref 65–99)
Potassium: 4.5 mmol/L (ref 3.5–5.3)
Sodium: 141 mmol/L (ref 135–146)
Total Bilirubin: 0.3 mg/dL (ref 0.2–1.2)
Total Protein: 6.9 g/dL (ref 6.1–8.1)

## 2019-01-14 LAB — TSH: TSH: 2.17 mIU/L (ref 0.40–4.50)

## 2019-01-14 LAB — SEDIMENTATION RATE: Sed Rate: 9 mm/h (ref 0–30)

## 2019-01-18 ENCOUNTER — Other Ambulatory Visit: Payer: Self-pay | Admitting: Family Medicine

## 2019-01-18 LAB — POCT CBC W AUTO DIFF (K'VILLE URGENT CARE)

## 2019-01-18 NOTE — Telephone Encounter (Signed)
Routed to [facility] for f/u with Dr. Madilyn Fireman.Beth [name], [place]ahoma Crocker, CMA

## 2019-01-19 ENCOUNTER — Other Ambulatory Visit: Payer: Self-pay

## 2019-01-19 MED ORDER — CLONAZEPAM 0.5 MG PO TABS: 0.5000 mg | ORAL_TABLET | Freq: Two times a day (BID) | ORAL | 3 refills | Status: DC

## 2019-01-19 NOTE — Telephone Encounter (Signed)
Pt's husband was updated that med refill was sent to local pharmacy. No other inquiries during the call.

## 2019-01-27 ENCOUNTER — Ambulatory Visit (INDEPENDENT_AMBULATORY_CARE_PROVIDER_SITE_OTHER): Payer: Medicare HMO | Admitting: Family Medicine

## 2019-01-27 ENCOUNTER — Encounter: Payer: Self-pay | Admitting: Family Medicine

## 2019-01-27 VITALS — BP 111/74 | HR 75 | Temp 98.2°F | Ht 65.0 in | Wt 147.0 lb

## 2019-01-27 DIAGNOSIS — R252 Cramp and spasm: Secondary | ICD-10-CM | POA: Insufficient documentation

## 2019-01-27 DIAGNOSIS — R5383 Other fatigue: Secondary | ICD-10-CM | POA: Diagnosis not present

## 2019-01-27 DIAGNOSIS — Z23 Encounter for immunization: Secondary | ICD-10-CM

## 2019-01-27 DIAGNOSIS — L989 Disorder of the skin and subcutaneous tissue, unspecified: Secondary | ICD-10-CM

## 2019-01-27 DIAGNOSIS — G479 Sleep disorder, unspecified: Secondary | ICD-10-CM | POA: Diagnosis not present

## 2019-01-27 DIAGNOSIS — R413 Other amnesia: Secondary | ICD-10-CM | POA: Diagnosis not present

## 2019-01-27 NOTE — Progress Notes (Signed)
Established Patient Office Visit  Subjective:  Patient ID: Beth [name], female    DOB: August 25, 1932  Age: 85 y.o. MRN: [id number]  CC:  Chief Complaint  Patient presents with  . dehydrated    HPI Beth [name] presents for fatigue and muscle cramps.  She says she is always dealt with cramps almost her entire life but in the last 6 weeks they have really ramped up to the point that she is been having in them in her legs or feet and even in her arms particularly her left arm and left leg.  She says in fact it was so bad the other day that she actually felt like she was having a cramp in the middle of her chest.  This was about 2 days ago.  She thought she might be a little bit dehydrated because she had been having some more loose stools recently and so started drinking some sports drinks and says she actually felt better.  She has intermittent diarrhea that comes and goes and follows with GI for this.  She often uses Pepto-Bismol when she does have loose stools which makes the stools look black.  She did go to urgent care last week for evaluation.   She had a normal CBC and CMP as well as thyroid level and sed rate while she was there.  She does take vitamin B6 regularly.  She says she just has not been sleeping well at all.  Her son who lives with her had back surgery and she is been helping to take care of him.  She also complains that her skin itches all over.  She also thinks it is from dryness. She had normal liver enzymes.     She has a skin lesion on her face that is white and shiny and she would like me to look at it. She has had a skin cancer removed near this area that required surgical excision.    A couple of week ago she was having pain going down her left entire left arm.  She did have a xray of her cervical spine done with UC and she does have some OA.   She has not been sleeping well. Only getting a few hours at night.    Past Medical History:  Diagnosis Date  . Allergy    . Anxiety    situational  . Arthritis   . Asthma   . Cataract   . COPD (chronic obstructive pulmonary disease) (HCC)    PER PT  . Diverticulitis   . Diverticulitis   . Emphysema of lung ([place])   . Esophageal dysmotility   . Fibromyalgia   . Hiatal hernia   . Hyperlipidemia   . Hypertension   . Insomnia   . Osteopenia   . UTI (lower urinary tract infection)     Past Surgical History:  Procedure Laterality Date  . ABDOMINAL HYSTERECTOMY    . BLADDER SURGERY     tac  . COLONOSCOPY     2008    Family History  Problem Relation Age of Onset  . Cancer Mother        breast  . Hyperlipidemia Mother   . Hypertension Mother   . Dementia Mother   . Heart disease Father        MI  . Depression Father   . Hyperlipidemia Father   . Colon cancer Neg Hx   . Esophageal cancer Neg Hx   . Stomach cancer Neg  Hx   . Rectal cancer Neg Hx     Social History   Socioeconomic History  . Marital status: Widowed    Spouse name: Not on file  . Number of children: 2  . Years of education: Not on file  . Highest education level: Not on file  Occupational History  . Occupation: retired  Scientific laboratory technician  . Financial resource strain: Not on file  . Food insecurity    Worry: Not on file    Inability: Not on file  . Transportation needs    Medical: Not on file    Non-medical: Not on file  Tobacco Use  . Smoking status: Former Research scientist (life sciences)  . Smokeless tobacco: Never Used  . Tobacco comment: smoker as a teenager  Substance and Sexual Activity  . Alcohol use: No    Alcohol/week: 0.0 standard drinks  . Drug use: No  . Sexual activity: Not on file  Lifestyle  . Physical activity    Days per week: Not on file    Minutes per session: Not on file  . Stress: Not on file  Relationships  . Social Herbalist on phone: Not on file    Gets together: Not on file    Attends religious service: Not on file    Active member of club or organization: Not on file    Attends meetings of  clubs or organizations: Not on file    Relationship status: Not on file  . Intimate partner violence    Fear of current or ex partner: Not on file    Emotionally abused: Not on file    Physically abused: Not on file    Forced sexual activity: Not on file  Other Topics Concern  . Not on file  Social History Narrative  . Not on file    Outpatient Medications Prior to Visit  Medication Sig Dispense Refill  . calcium carbonate 1250 MG capsule Take 1,250 mg by mouth daily.     . Cholecalciferol (VITAMIN D3) 1000 UNITS tablet Take 1,000 Units by mouth daily.      . clonazePAM (KLONOPIN) 0.5 MG tablet Take 1 tablet (0.5 mg total) by mouth 2 (two) times daily. 60 tablet 3  . fexofenadine (ALLEGRA) 180 MG tablet Take 180 mg by mouth daily.    Marland Kitchen ibuprofen (ADVIL,MOTRIN) 200 MG tablet Take 200 mg by mouth every 6 (six) hours as needed.    . metoprolol succinate (TOPROL-XL) 25 MG 24 hr tablet Take 1 tablet by mouth once daily 90 tablet 0  . Multiple Vitamin (MULTIVITAMIN) tablet Take 1 tablet by mouth daily.      . Multiple Vitamins-Minerals (PRESERVISION AREDS 2+MULTI VIT) CAPS Take by mouth. Preservision AREDS-2    . naproxen sodium (ALEVE) 220 MG tablet Take 220 mg by mouth as needed.    Marland Kitchen omeprazole (PRILOSEC) 40 MG capsule Take 1 capsule (40 mg total) by mouth 2 (two) times daily at 10 AM and 5 PM. 90 capsule 3  . Pyridoxine HCl (VITAMIN B-6) 500 MG tablet Take 500 mg by mouth daily.      . sertraline (ZOLOFT) 50 MG tablet Take 1 tablet by mouth once daily 90 tablet 0  . vitamin B-12 (CYANOCOBALAMIN) 1000 MCG tablet Take 1,000 mcg by mouth daily.       No facility-administered medications prior to visit.     Allergies  Allergen Reactions  . Doxycycline Other (See Comments)    Blisters on mouth and lips  .  Pravachol [Pravastatin]     Fatigued.   . Trazodone And Nefazodone Other (See Comments)    Made her hyper  . Zocor [Simvastatin] Other (See Comments)    Myalgias and memory loss     ROS Review of Systems    Objective:    Physical Exam  Constitutional: She is oriented to person, place, and time. She appears well-developed and well-nourished.  HENT:  Head: Normocephalic and atraumatic.  Cardiovascular: Normal rate, regular rhythm and normal heart sounds.  Pulmonary/Chest: Effort normal and breath sounds normal.  Neurological: She is alert and oriented to person, place, and time.  Skin: Skin is warm and dry.  Just above her upper lip she has a white slightly raised lesion.  It does have a slightly shiny appearance to it.    Psychiatric: She has a normal mood and affect. Her behavior is normal.    BP 111/74   Pulse 75   Temp 98.2 F (36.8 C)   Ht 5\' 5"  (1.651 m)   Wt 147 lb (66.7 kg)   SpO2 98%   BMI 24.46 kg/m  Wt Readings from Last 3 Encounters:  01/27/19 147 lb (66.7 kg)  01/13/19 153 lb (69.4 kg)  04/19/18 155 lb (70.3 kg)     There are no preventive care reminders to display for this patient.  There are no preventive care reminders to display for this patient.  Lab Results  Component Value Date   TSH 2.17 01/13/2019   Lab Results  Component Value Date   WBC 9.3 07/25/2016   HGB 13.6 07/25/2016   HCT 40.3 07/25/2016   MCV 89.4 07/25/2016   PLT 241 07/25/2016   Lab Results  Component Value Date   NA 141 01/13/2019   K 4.5 01/13/2019   CO2 30 01/13/2019   GLUCOSE 93 01/13/2019   BUN 12 01/13/2019   CREATININE 0.71 01/13/2019   BILITOT 0.3 01/13/2019   ALKPHOS 57 07/25/2016   AST 15 01/13/2019   ALT 13 01/13/2019   PROT 6.9 01/13/2019   ALBUMIN 4.2 07/25/2016   CALCIUM 9.8 01/13/2019   ANIONGAP 8 07/25/2016   Lab Results  Component Value Date   CHOL 226 (H) 02/04/2018   Lab Results  Component Value Date   HDL 45 (L) 02/04/2018   Lab Results  Component Value Date   LDLCALC 154 (H) 02/04/2018   Lab Results  Component Value Date   TRIG 147 02/04/2018   Lab Results  Component Value Date   CHOLHDL 5.0 (H)  02/04/2018   No results found for: HGBA1C    Assessment & Plan:   Problem List Items Addressed This Visit      Other   Muscle cramps - Primary    She has had significant increase in cramping.  Encouraged to hydrate better. She admits she does't drink much during the day.  CBC, thyroid and sed rate and CMP were all normal.  Will check CK level. She is already on B6. Discussed starting CaChblocker if labs come back normal.       Memory impairment    Plan to do MMSE when returns in about 3-4 weeks.  Last MMSE was 26/30.       Other Visit Diagnoses    Need for immunization against influenza       Relevant Orders   Flu Vaccine QUAD High Dose(Fluad) (Completed)   Muscle cramp       Relevant Orders   CK (Creatine Kinase)   Ferritin  Magnesium   Skin lesion of face       Relevant Orders   Ambulatory referral to Dermatology   Sleep disturbance         Skin lesion of face - concerning for basal cell.  Will refer to dermatology for further evaluation.    No orders of the defined types were placed in this encounter.   Follow-up: Return in about 3 weeks (around 02/17/2019) for carmping.    Beatrice Lecher, MD

## 2019-01-27 NOTE — Assessment & Plan Note (Signed)
She has had significant increase in cramping.  Encouraged to hydrate better. She admits she does't drink much during the day.  CBC, thyroid and sed rate and CMP were all normal.  Will check CK level. She is already on B6. Discussed starting CaChblocker if labs come back normal.

## 2019-01-27 NOTE — Assessment & Plan Note (Signed)
Plan to do MMSE when returns in about 3-4 weeks.  Last MMSE was 26/30.

## 2019-01-28 ENCOUNTER — Other Ambulatory Visit: Payer: Self-pay | Admitting: *Deleted

## 2019-01-28 LAB — CK: Total CK: 126 U/L (ref 29–143)

## 2019-01-28 LAB — FERRITIN: Ferritin: 46 ng/mL (ref 16–288)

## 2019-01-28 LAB — MAGNESIUM: Magnesium: 2.3 mg/dL (ref 1.5–2.5)

## 2019-01-28 MED ORDER — DILTIAZEM HCL ER 60 MG PO CP12: 60.0000 mg | ORAL_CAPSULE | Freq: Two times a day (BID) | ORAL | 1 refills | Status: DC

## 2019-01-28 MED ORDER — ALBUTEROL SULFATE HFA 108 (90 BASE) MCG/ACT IN AERS: 2.0000 | INHALATION_SPRAY | Freq: Four times a day (QID) | RESPIRATORY_TRACT | 99 refills | Status: DC | PRN

## 2019-01-28 MED ORDER — TRIAMCINOLONE ACETONIDE 0.1 % EX CREA: 1.0000 "application " | TOPICAL_CREAM | Freq: Two times a day (BID) | CUTANEOUS | 0 refills | Status: DC

## 2019-01-28 NOTE — Addendum Note (Signed)
Addended by: Beatrice Lecher D on: 01/28/2019 07:36 AM   Modules accepted: Orders

## 2019-02-14 ENCOUNTER — Other Ambulatory Visit: Payer: Self-pay | Admitting: Family Medicine

## 2019-02-17 ENCOUNTER — Ambulatory Visit: Payer: Medicare HMO | Admitting: Family Medicine

## 2019-02-22 ENCOUNTER — Ambulatory Visit: Payer: Medicare HMO | Admitting: Family Medicine

## 2019-03-07 DIAGNOSIS — N3000 Acute cystitis without hematuria: Secondary | ICD-10-CM | POA: Diagnosis not present

## 2019-03-28 ENCOUNTER — Other Ambulatory Visit: Payer: Self-pay | Admitting: Family Medicine

## 2019-03-28 DIAGNOSIS — F411 Generalized anxiety disorder: Secondary | ICD-10-CM

## 2019-04-06 ENCOUNTER — Telehealth: Payer: Self-pay | Admitting: Family Medicine

## 2019-04-06 ENCOUNTER — Ambulatory Visit: Payer: Medicare HMO | Admitting: Physician Assistant

## 2019-04-06 NOTE — Telephone Encounter (Signed)
Patient son called and was concerned that his mom was not taking the Diltiazem. She has been taking the Metoprolol. I let him know per the last visit that she was supposed to stop taking the Metoprolol and start taking the Diltiazem. She is going to start the new medication tomorrow and then they will call back in 4 to 6 weeks with you. FYI.

## 2019-04-06 NOTE — Telephone Encounter (Signed)
Yes, I agree 

## 2019-05-09 ENCOUNTER — Telehealth: Payer: Self-pay | Admitting: Family Medicine

## 2019-05-09 NOTE — Telephone Encounter (Signed)
Patient called and had a fall last night and was offered a virtual visit and declined. She reports she is taking Asprin. She has taken a couple of Ibuprofen. She will call back if her pain gets worse or she has new symptoms. FYI

## 2019-05-11 ENCOUNTER — Other Ambulatory Visit: Payer: Self-pay

## 2019-05-11 ENCOUNTER — Ambulatory Visit (INDEPENDENT_AMBULATORY_CARE_PROVIDER_SITE_OTHER): Payer: Medicare HMO

## 2019-05-11 ENCOUNTER — Encounter: Payer: Self-pay | Admitting: Physician Assistant

## 2019-05-11 ENCOUNTER — Ambulatory Visit (INDEPENDENT_AMBULATORY_CARE_PROVIDER_SITE_OTHER): Payer: Medicare HMO | Admitting: Physician Assistant

## 2019-05-11 VITALS — BP 147/63 | HR 71 | Temp 97.5°F | Ht 65.0 in | Wt 162.0 lb

## 2019-05-11 DIAGNOSIS — R0781 Pleurodynia: Secondary | ICD-10-CM

## 2019-05-11 DIAGNOSIS — J449 Chronic obstructive pulmonary disease, unspecified: Secondary | ICD-10-CM | POA: Diagnosis not present

## 2019-05-11 DIAGNOSIS — W19XXXA Unspecified fall, initial encounter: Secondary | ICD-10-CM | POA: Diagnosis not present

## 2019-05-11 DIAGNOSIS — J441 Chronic obstructive pulmonary disease with (acute) exacerbation: Secondary | ICD-10-CM

## 2019-05-11 DIAGNOSIS — S2242XA Multiple fractures of ribs, left side, initial encounter for closed fracture: Secondary | ICD-10-CM

## 2019-05-11 MED ORDER — METHYLPREDNISOLONE ACETATE 80 MG/ML IJ SUSP
80.0000 mg | Freq: Once | INTRAMUSCULAR | Status: AC
  Administered 2019-05-11: 12:00:00 80 mg via INTRAMUSCULAR

## 2019-05-11 MED ORDER — AZITHROMYCIN 250 MG PO TABS: ORAL_TABLET | ORAL | 0 refills | Status: DC

## 2019-05-11 NOTE — Patient Instructions (Addendum)
Tylenol 1000mg  every 4 alternated with ibuprofen 400-600mg .  Start zpak.   Rib Contusion A rib contusion is a deep bruise on your rib area. Contusions are the result of a blunt trauma that causes bleeding and injury to the tissues under the skin. A rib contusion may involve bruising of the ribs and of the skin and muscles in the area. The skin over the contusion may turn blue, purple, or yellow. Minor injuries will give you a painless contusion. More severe contusions may stay painful and swollen for a few weeks. What are the causes? This condition is usually caused by a blow, trauma, or direct force to an area of the body. This often occurs while playing contact sports. What are the signs or symptoms? Symptoms of this condition include:  Swelling and redness of the injured area.  Discoloration of the injured area.  Tenderness and soreness of the injured area.  Pain with or without movement. How is this diagnosed? This condition may be diagnosed based on:  Your symptoms and medical history.  A physical exam.  Imaging tests--such as an X-ray, CT scan, or MRI--to determine if there were internal injuries or broken bones (fractures). How is this treated? This condition may be treated with:  Rest. This is often the best treatment for a rib contusion.  Icing. This reduces swelling and inflammation.  Deep-breathing exercises. These may be recommended to reduce the risk for lung collapse and pneumonia.  Medicines. Over-the-counter or prescription medicines may be given to control pain.  Injection of a numbing medicine around the nerve near your injury (nerve block). Follow these instructions at home:     Medicines  Take over-the-counter and prescription medicines only as told by your health care provider.  Do not drive or use heavy machinery while taking prescription pain medicine.  If you are taking prescription pain medicine, take actions to prevent or treat constipation.  Your health care provider may recommend that you: ? Drink enough fluid to keep your urine pale yellow. ? Eat foods that are high in fiber, such as fresh fruits and vegetables, whole grains, and beans. ? Limit foods that are high in fat and processed sugars, such as fried or sweet foods. ? Take an over-the-counter or prescription medicine for constipation. Managing pain, stiffness, and swelling  If directed, put ice on the injured area: ? Put ice in a plastic bag. ? Place a towel between your skin and the bag. ? Leave the ice on for 20 minutes, 2-3 times a day.  Rest the injured area. Avoid strenuous activity and any activities or movements that cause pain. Be careful during activities and avoid bumping the injured area.  Do not lift anything that is heavier than 5 lb (2.3 kg), or the limit that you are told, until your health care provider says that it is safe. General instructions  Do not use any products that contain nicotine or tobacco, such as cigarettes and e-cigarettes. These can delay healing. If you need help quitting, ask your health care provider.  Do deep-breathing exercises as told by your health care provider.  If you were given an incentive spirometer, use it every 1-2 hours while you are awake, or as recommended by your health care provider. This device measures how well you are filling your lungs with each breath.  Keep all follow-up visits as told by your health care provider. This is important. Contact a health care provider if you have:  Increased bruising or swelling.  Pain that  is not controlled with treatment.  A fever. Get help right away if you:  Have difficulty breathing or shortness of breath.  Develop a continual cough or you cough up thick or bloody sputum.  Feel nauseous or you vomit.  Have pain in your abdomen. Summary  A rib contusion is a deep bruise on your rib area. Contusions are the result of a blunt trauma that causes bleeding and injury  to the tissues under the skin.  The skin overlying the contusion may turn blue, purple, or yellow. Minor injuries may give you a painless contusion. More severe contusions may stay painful and swollen for a few weeks.  Rest the injured area. Avoid strenuous activity and any activities or movements that cause pain. This information is not intended to replace advice given to you by your health care provider. Make sure you discuss any questions you have with your health care provider. Document Revised: 05/13/2017 Document Reviewed: 05/13/2017 Elsevier Patient Education  2020 [employer].

## 2019-05-11 NOTE — Progress Notes (Signed)
Call pt: she does have 2 nondisplaced rib fractures 6 and 7. Treatment plan in office does not change. Alternate tylenol and ibuprofen. ICE area. Take 10 deep breaths and hour. Hold pillow to ribs if have to cough.

## 2019-05-11 NOTE — Progress Notes (Signed)
Subjective:    Patient ID: Beth [name], female    DOB: May 15, 1932, 86 y.o.   MRN: [id number]  HPI  Pt is a 86 yo female with COPD and osteopenia who presents to the clinic after fall on Sunday night, 3 days ago. She was walking up stairs and her toe hit the top step and she fell forward into her porch furniture. She hit her right tricep and her left anterior ribs and then left flank. She is very bruised and sore. The worst pain is over her left anterior side. She cannot touch it at all. It hurts to cough. She has COPD and finding it harder to breath. She needs to cough but can't. She is using albuterol twice a day. She is not on preventative. No fever, chills, headaches.   Pt is on klonapin but only takes at night before bed. She reports it had nothing to do with fall.   .. Active Ambulatory Problems    Diagnosis Date Noted  . MACULAR DEGENERATION, RIGHT EYE 12/06/2008  . HYPERTENSION, BENIGN 12/06/2008  . Diaphragmatic hernia 03/14/2009  . DIVERTICULITIS, COLON 12/06/2008  . FIBROMYALGIA 12/06/2008  . OSTEOPENIA 12/06/2008  . MURMUR 05/11/2009  . URINARY URGENCY 05/11/2009  . Reaction, situational, acute, to stress 09/09/2010  . Ataxia 03/16/2011  . HYPERLIPIDEMIA 11/13/2010  . Lactose intolerance 09/06/2013  . Generalized anxiety disorder 01/16/2014  . Insomnia 01/16/2014  . Headache(784.0) 01/17/2014  . COPD, mild ([place]) 10/23/2014  . Laryngopharyngeal reflux (LPR) 10/23/2014  . History of recurrent UTIs 05/29/2015  . Cardiomegaly 11/06/2015  . Recurrent UTI (urinary tract infection) 07/14/2017  . Memory impairment 02/04/2018  . Sciatica, left side 02/04/2018  . Reactive airways dysfunction syndrome ([place]) 03/30/2018  . Muscle cramps 01/27/2019  . Multiple closed fractures of ribs of left side 05/12/2019  . Fall 05/12/2019   Resolved Ambulatory Problems    Diagnosis Date Noted  . HYPERCHOLESTEROLEMIA 12/06/2008  . Acute bronchitis 05/03/2010  . COPD (chronic  obstructive pulmonary disease) ([place]) 11/08/2012  . Pneumonia 01/16/2014  . CAP (community acquired pneumonia) 01/16/2014  . Diarrhea 12/15/2014  . Diarrhea 07/20/2015  . Bloating 07/20/2015  . Upper abdominal pain 07/20/2015  . Dysuria 12/27/2015  . Needs flu shot 12/27/2015  . Tick bite 12/03/2017   Past Medical History:  Diagnosis Date  . Allergy   . Anxiety   . Arthritis   . Asthma   . Cataract   . Diverticulitis   . Diverticulitis   . Emphysema of lung ([place])   . Esophageal dysmotility   . Fibromyalgia   . Hiatal hernia   . Hyperlipidemia   . Hypertension   . Osteopenia   . UTI (lower urinary tract infection)       Review of Systems See HPI.     Objective:   Physical Exam Vitals reviewed.  Constitutional:      Appearance: Normal appearance.  Cardiovascular:     Rate and Rhythm: Normal rate and regular rhythm.  Pulmonary:     Effort: Pulmonary effort is normal.     Breath sounds: Wheezing present.     Comments: Bilateral wheezing and rhonchi mid lung to base.  Abdominal:     Comments: extremely painful left anterior and lateral side pain to palpation along the ribs.  Bruising noted left flank area and right tricep.   Neurological:     Mental Status: She is alert.           Assessment & Plan:  Marland KitchenMarland KitchenBonnita [name]  was seen today for fall.  Diagnoses and all orders for this visit:  Closed fracture of multiple ribs of left side, initial encounter -     XR Ribs Unilateral W/Chest Left  COPD, mild (HCC)  COPD exacerbation (HCC) -     XR Ribs Unilateral W/Chest Left -     azithromycin (ZITHROMAX) 250 MG tablet; Take 2 tablets now and then one tablet for 4 days. -     methylPREDNISolone acetate (DEPO-MEDROL) injection 80 mg  Fall, initial encounter -     XR Ribs Unilateral W/Chest Left  Rib pain on left side -     XR Ribs Unilateral W/Chest Left   Xray confirmed rib fracture.  Discussed alternating tylenol and ibuprofen 1000mg  and 400mg  every 4 hours.   GFR normal. ICE area.  Take 10 full deep breaths every hour.  If still having significant pain call office.  Discussed can take some time for pain to resolve.   Likely not taking full breaths complicating COPD exacerbation. Added shot of depo medrol and zpack. Continue to use albuterol as needed. Consider coughing with pillow to chest when needed.   Follow up with metheney in 3 weeks for follow up.

## 2019-05-12 DIAGNOSIS — W19XXXA Unspecified fall, initial encounter: Secondary | ICD-10-CM | POA: Insufficient documentation

## 2019-05-12 DIAGNOSIS — S2242XA Multiple fractures of ribs, left side, initial encounter for closed fracture: Secondary | ICD-10-CM | POA: Insufficient documentation

## 2019-05-18 ENCOUNTER — Other Ambulatory Visit: Payer: Self-pay | Admitting: Family Medicine

## 2019-05-20 ENCOUNTER — Ambulatory Visit (INDEPENDENT_AMBULATORY_CARE_PROVIDER_SITE_OTHER): Payer: Medicare HMO

## 2019-05-20 ENCOUNTER — Ambulatory Visit (INDEPENDENT_AMBULATORY_CARE_PROVIDER_SITE_OTHER): Payer: Medicare HMO | Admitting: Family Medicine

## 2019-05-20 ENCOUNTER — Other Ambulatory Visit: Payer: Self-pay

## 2019-05-20 ENCOUNTER — Encounter: Payer: Self-pay | Admitting: Family Medicine

## 2019-05-20 ENCOUNTER — Telehealth: Payer: Self-pay

## 2019-05-20 DIAGNOSIS — R252 Cramp and spasm: Secondary | ICD-10-CM

## 2019-05-20 DIAGNOSIS — J441 Chronic obstructive pulmonary disease with (acute) exacerbation: Secondary | ICD-10-CM

## 2019-05-20 DIAGNOSIS — F320 Major depressive disorder, single episode, mild: Secondary | ICD-10-CM

## 2019-05-20 DIAGNOSIS — X58XXXD Exposure to other specified factors, subsequent encounter: Secondary | ICD-10-CM | POA: Diagnosis not present

## 2019-05-20 DIAGNOSIS — R202 Paresthesia of skin: Secondary | ICD-10-CM | POA: Diagnosis not present

## 2019-05-20 DIAGNOSIS — S2249XD Multiple fractures of ribs, unspecified side, subsequent encounter for fracture with routine healing: Secondary | ICD-10-CM | POA: Diagnosis not present

## 2019-05-20 DIAGNOSIS — Z209 Contact with and (suspected) exposure to unspecified communicable disease: Secondary | ICD-10-CM

## 2019-05-20 DIAGNOSIS — R0781 Pleurodynia: Secondary | ICD-10-CM

## 2019-05-20 DIAGNOSIS — R05 Cough: Secondary | ICD-10-CM | POA: Diagnosis not present

## 2019-05-20 DIAGNOSIS — R69 Illness, unspecified: Secondary | ICD-10-CM | POA: Diagnosis not present

## 2019-05-20 MED ORDER — LEVOFLOXACIN 750 MG PO TABS: 750.0000 mg | ORAL_TABLET | Freq: Every day | ORAL | 0 refills | Status: DC

## 2019-05-20 MED ORDER — PREDNISONE 20 MG PO TABS: 40.0000 mg | ORAL_TABLET | Freq: Every day | ORAL | 0 refills | Status: DC

## 2019-05-20 NOTE — Progress Notes (Signed)
Virtual Visit via Telephone Note  I connected with Beth [name] on 05/20/19 at  4:00 PM EST by telephone and verified that I am speaking with the correct person using two identifiers.   I discussed the limitations, risks, security and privacy concerns of performing an evaluation and management service by telephone and the availability of in person appointments. I also discussed with the patient that there may be a patient responsible charge related to this service. The patient expressed understanding and agreed to proceed.  Patient location: AT home  Provider loccation: In office   Subjective:    CC: Cough.   HPI:  She has had a cough for about 3 - 4 weeks and was seen after a fall on 1/13 and treated on 1/13 for rib fracture at 6th and 7th ribs and COPD exacerbation.  Cough is more dry, but occ foamy.  No ST.  Her son [name] has been in the hospital with PNA,  so has been depressed and has been laying in bed.  No swelling in her legs. No HA. No loss of taste or smell. Zpack on helped temporarily but only a few days.  Then started to feel worse again.  Had a fever on Monday but none since then. Tylenol helping her rib pain, but hard to sleep.   Her left foot has been going numb before her fall but seems to be getting worse. Cramping some.    Past medical history, Surgical history, Family history not pertinant except as noted below, Social history, Allergies, and medications have been entered into the medical record, reviewed, and corrections made.   Review of Systems: No fevers, chills, night sweats, weight loss, chest pain, or shortness of breath.   Objective:    General: Speaking clearly in complete sentences without any shortness of breath.  Alert and oriented x3.  Normal judgment. No apparent acute distress.   Impression and Recommendations:   COPD exacerbation - Will tx with Levaquin and prednisone. I am concerned about PNA as she has a rib fracture and has been laying in bed and  not moving much.     Rib fracture - continue her Tylenol. Recommend heat and mild compression if needed with cough and sneezing.     Left foot paresthesias and cramping - will work up further when feeling better.    Mild depression-very worried about [name].  Hopefully will be coming on oxygen and hopefully will recover quickly.     I discussed the assessment and treatment plan with the patient. The patient was provided an opportunity to ask questions and all were answered. The patient agreed with the plan and demonstrated an understanding of the instructions.   The patient was advised to call back or seek an in-person evaluation if the symptoms worsen or if the condition fails to improve as anticipated.  I provided 25 minutes of non-face-to-face time during this encounter.   Beatrice Lecher, MD

## 2019-05-20 NOTE — Telephone Encounter (Signed)
Please get her scheduled for chest x-ray downstairs and see if she can go before lunch.  And then put her on my schedule for virtual visit.  I think we had a cancellation.  I think it is not to later in the afternoon but if I get a chance I can try to call her even earlier.  It looks like she was just on a Z-Pak less than a week ago and was evaluated for possible rib fracture again about a week ago.

## 2019-05-20 NOTE — Telephone Encounter (Signed)
Beth [name] was seen earlier this month for cough. She states she has a worsening of cough and pain in her lower rib area. She also reports a fever. She states she feels really bad. Please advise next steps.

## 2019-05-20 NOTE — Telephone Encounter (Signed)
Beth [name] advised of recommendations. She has been scheduled for virtual visit. Ordered chest xray. Also advised to come to drive up for [id number] swab.

## 2019-05-22 LAB — NOVEL CORONAVIRUS, NAA: SARS-CoV-2, NAA: NOT DETECTED

## 2019-05-30 ENCOUNTER — Ambulatory Visit: Payer: Medicare HMO | Admitting: Physician Assistant

## 2019-06-01 ENCOUNTER — Ambulatory Visit: Payer: Medicare HMO | Admitting: Family Medicine

## 2019-06-17 ENCOUNTER — Other Ambulatory Visit: Payer: Self-pay | Admitting: Family Medicine

## 2019-06-17 NOTE — Telephone Encounter (Signed)
Did go ahead and refill her prescription but as we have discussed previously in the past I really want her to start weaning down on her dose.  This can be contributing to some memory issues that she has been having.  So I change the prescription so that it says 1/2-1 whole.  And instead of 60 tabs per month I gave her 50 tabs.  So at least a few times during the month I want her to take a half of a tab instead of a whole tab.

## 2019-06-17 NOTE — Telephone Encounter (Signed)
[facility] requesting med refill for clonazepam.

## 2019-06-21 NOTE — Telephone Encounter (Signed)
06/21/2019  LVM for pt to call to discuss additional directions from Dr. Madilyn Fireman.  Charyl Bigger, CMA

## 2019-06-24 ENCOUNTER — Ambulatory Visit: Payer: Medicare HMO | Attending: Internal Medicine

## 2019-06-24 DIAGNOSIS — Z23 Encounter for immunization: Secondary | ICD-10-CM

## 2019-06-24 NOTE — Progress Notes (Signed)
   Covid-19 Vaccination Clinic  Name:  Beth [name]    MRN: [id number] DOB: 17-Apr-1933  06/24/2019  [name] was observed post Covid-19 immunization for 15 minutes without incidence. She was provided with Vaccine Information Sheet and instruction to access the V-Safe system.   [name] was instructed to call 911 with any severe reactions post vaccine: Marland Kitchen Difficulty breathing  . Swelling of your face and throat  . A fast heartbeat  . A bad rash all over your body  . Dizziness and weakness    Immunizations Administered    Name Date Dose VIS Date Route   Pfizer COVID-19 Vaccine 06/24/2019  1:50 PM 0.3 mL 04/08/2019 Intramuscular   Manufacturer: [place]   Lot: [id number]   [place]: [id number]

## 2019-06-30 ENCOUNTER — Other Ambulatory Visit: Payer: Self-pay | Admitting: Family Medicine

## 2019-07-04 ENCOUNTER — Other Ambulatory Visit: Payer: Self-pay | Admitting: Family Medicine

## 2019-07-07 ENCOUNTER — Other Ambulatory Visit: Payer: Self-pay | Admitting: Family Medicine

## 2019-07-11 ENCOUNTER — Other Ambulatory Visit: Payer: Self-pay | Admitting: *Deleted

## 2019-07-11 ENCOUNTER — Other Ambulatory Visit: Payer: Self-pay | Admitting: Family Medicine

## 2019-07-14 ENCOUNTER — Other Ambulatory Visit: Payer: Self-pay | Admitting: Family Medicine

## 2019-07-14 NOTE — Telephone Encounter (Signed)
Pt called and left a Voicemail stating she needs meds refilled and has been trying to get them refilled for 2 weeks. Her call back number is [phone]

## 2019-07-14 NOTE — Telephone Encounter (Signed)
Called pt at # provided. Phone rang multiple times no answer.  The medications were denied. On 04/06/2019 there was a call from her son stating that he was concerned about her and that she wasn't taking the medication (Diltiazem). She was actually supposed to STOP  TAKING THE METOPROLOL. AND START/CONTINUE DILTIAZEM.   As far as the Klonopin this was refilled on 06/17/2019 it was too early before but will send request to Dr. Madilyn Fireman to sign.Marland Kitchen

## 2019-07-15 MED ORDER — CLONAZEPAM 0.5 MG PO TABS: 0.2500 mg | ORAL_TABLET | Freq: Two times a day (BID) | ORAL | 0 refills | Status: DC | PRN

## 2019-07-18 ENCOUNTER — Other Ambulatory Visit: Payer: Self-pay

## 2019-07-18 DIAGNOSIS — R252 Cramp and spasm: Secondary | ICD-10-CM

## 2019-07-18 MED ORDER — DILTIAZEM HCL ER 60 MG PO CP12: 60.0000 mg | ORAL_CAPSULE | Freq: Two times a day (BID) | ORAL | 1 refills | Status: DC

## 2019-07-19 ENCOUNTER — Other Ambulatory Visit: Payer: Self-pay

## 2019-07-19 ENCOUNTER — Ambulatory Visit (INDEPENDENT_AMBULATORY_CARE_PROVIDER_SITE_OTHER): Payer: Medicare HMO | Admitting: Family Medicine

## 2019-07-19 ENCOUNTER — Encounter: Payer: Self-pay | Admitting: Family Medicine

## 2019-07-19 VITALS — BP 130/78 | HR 83 | Temp 98.4°F | Ht 65.0 in | Wt 162.0 lb

## 2019-07-19 DIAGNOSIS — I10 Essential (primary) hypertension: Secondary | ICD-10-CM | POA: Diagnosis not present

## 2019-07-19 DIAGNOSIS — R1310 Dysphagia, unspecified: Secondary | ICD-10-CM

## 2019-07-19 DIAGNOSIS — R69 Illness, unspecified: Secondary | ICD-10-CM | POA: Diagnosis not present

## 2019-07-19 DIAGNOSIS — F4321 Adjustment disorder with depressed mood: Secondary | ICD-10-CM | POA: Insufficient documentation

## 2019-07-19 DIAGNOSIS — R413 Other amnesia: Secondary | ICD-10-CM | POA: Diagnosis not present

## 2019-07-19 DIAGNOSIS — J449 Chronic obstructive pulmonary disease, unspecified: Secondary | ICD-10-CM | POA: Diagnosis not present

## 2019-07-19 NOTE — Progress Notes (Signed)
Established Patient Office Visit  Subjective:  Patient ID: Beth [name], female    DOB: 1932/12/19  Age: 86 y.o. MRN: [id number]  CC: No chief complaint on file.   HPI VELA SERAFINO presents to discuss her grief.  Her son who has lived with her for several years and really done a lot to help take care of her actually recently passed away he had an underlying pulmonary condition and then had surgery and had complications and after being in the hospital for times they eventually had to withdraw life support.  This is just been completely devastating for her.  She says it has been harder than actually losing her husband.  She has been tearful and crying daily.  She has not been sleeping well.  Follow up COPD-stable no recent flares or exacerbations she is actually doing okay.  She feels like her esophageal stricture may actually be coming back she is starting to get some dysphagia and feeling like food is getting stuck again.  She had actually made an appointment with her GI, Dr. Hilarie Fredrickson when her son became ill and so canceled it.  She said she is in the midst of calling to reschedule that.  She also wanted to let me know that she had an unusual episode about 2 days ago.  Her daughter had called to let her know that they were coming over later that afternoon.  She evidently fell asleep in the middle of the day and was very disoriented they ended up having to knock on the doors and windows before she finally awoke to come and answer the door and at that point she just felt very confused and shaky and jittery.  She says it really was not until the next day that she even remembered what happened she is worried that she may have had some type of mini stroke she is not had any weakness of the extremities or significant change in hearing or vision since the episode.  But she has had some worsening vision loss in both eyes but again that is not new and is planning on getting back in with her eye  doctor.  Past Medical History:  Diagnosis Date  . Allergy   . Anxiety    situational  . Arthritis   . Asthma   . Cataract   . COPD (chronic obstructive pulmonary disease) (HCC)    PER PT  . Diverticulitis   . Diverticulitis   . Emphysema of lung ([place])   . Esophageal dysmotility   . Fibromyalgia   . Hiatal hernia   . Hyperlipidemia   . Hypertension   . Insomnia   . Osteopenia   . UTI (lower urinary tract infection)     Past Surgical History:  Procedure Laterality Date  . ABDOMINAL HYSTERECTOMY    . BLADDER SURGERY     tac  . COLONOSCOPY     2008    Family History  Problem Relation Age of Onset  . Cancer Mother        breast  . Hyperlipidemia Mother   . Hypertension Mother   . Dementia Mother   . Heart disease Father        MI  . Depression Father   . Hyperlipidemia Father   . Colon cancer Neg Hx   . Esophageal cancer Neg Hx   . Stomach cancer Neg Hx   . Rectal cancer Neg Hx     Social History   Socioeconomic History  .  Marital status: Widowed    Spouse name: Not on file  . Number of children: 2  . Years of education: Not on file  . Highest education level: Not on file  Occupational History  . Occupation: retired  Tobacco Use  . Smoking status: Former Research scientist (life sciences)  . Smokeless tobacco: Never Used  . Tobacco comment: smoker as a teenager  Substance and Sexual Activity  . Alcohol use: No    Alcohol/week: 0.0 standard drinks  . Drug use: No  . Sexual activity: Not on file  Other Topics Concern  . Not on file  Social History Narrative  . Not on file   Social Determinants of Health   Financial Resource Strain:   . Difficulty of Paying Living Expenses:   Food Insecurity:   . Worried About Charity fundraiser in the Last Year:   . Arboriculturist in the Last Year:   Transportation Needs:   . Film/video editor (Medical):   Marland Kitchen Lack of Transportation (Non-Medical):   Physical Activity:   . Days of Exercise per Week:   . Minutes of Exercise per  Session:   Stress:   . Feeling of Stress :   Social Connections:   . Frequency of Communication with Friends and Family:   . Frequency of Social Gatherings with Friends and Family:   . Attends Religious Services:   . Active Member of Clubs or Organizations:   . Attends Archivist Meetings:   Marland Kitchen Marital Status:   Intimate Partner Violence:   . Fear of Current or Ex-Partner:   . Emotionally Abused:   Marland Kitchen Physically Abused:   . Sexually Abused:     Outpatient Medications Prior to Visit  Medication Sig Dispense Refill  . albuterol (VENTOLIN HFA) 108 (90 Base) MCG/ACT inhaler Inhale 2 puffs into the lungs every 6 (six) hours as needed for wheezing or shortness of breath. 18 g prn  . calcium carbonate 1250 MG capsule Take 1,250 mg by mouth daily.     . Cholecalciferol (VITAMIN D3) 1000 UNITS tablet Take 1,000 Units by mouth daily.      . clonazePAM (KLONOPIN) 0.5 MG tablet Take 0.5-1 tablets (0.25-0.5 mg total) by mouth 2 (two) times daily as needed. This is a 30 days supply 45 tablet 0  . diltiazem (CARDIZEM SR) 60 MG 12 hr capsule Take 1 capsule (60 mg total) by mouth 2 (two) times daily. 60 capsule 1  . fexofenadine (ALLEGRA) 180 MG tablet Take 180 mg by mouth daily.    Marland Kitchen ibuprofen (ADVIL,MOTRIN) 200 MG tablet Take 200 mg by mouth every 6 (six) hours as needed.    . Multiple Vitamin (MULTIVITAMIN) tablet Take 1 tablet by mouth daily.      . Multiple Vitamins-Minerals (PRESERVISION AREDS 2+MULTI VIT) CAPS Take by mouth. Preservision AREDS-2    . naproxen sodium (ALEVE) 220 MG tablet Take 220 mg by mouth as needed.    Marland Kitchen omeprazole (PRILOSEC) 40 MG capsule Take 1 capsule (40 mg total) by mouth 2 (two) times daily at 10 AM and 5 PM. 90 capsule 3  . Pyridoxine HCl (VITAMIN B-6) 500 MG tablet Take 500 mg by mouth daily.      . sertraline (ZOLOFT) 50 MG tablet Take 1 tablet by mouth once daily 90 tablet 0  . vitamin B-12 (CYANOCOBALAMIN) 1000 MCG tablet Take 1,000 mcg by mouth daily.        No facility-administered medications prior to visit.    Allergies  Allergen Reactions  . Doxycycline Other (See Comments)    Blisters on mouth and lips  . Pravachol [Pravastatin]     Fatigued.   . Trazodone And Nefazodone Other (See Comments)    Made her hyper  . Zocor [Simvastatin] Other (See Comments)    Myalgias and memory loss    ROS Review of Systems    Objective:    Physical Exam  Constitutional: She is oriented to person, place, and time. She appears well-developed and well-nourished.  HENT:  Head: Normocephalic and atraumatic.  Cardiovascular: Normal rate, regular rhythm and normal heart sounds.  Pulmonary/Chest: Effort normal and breath sounds normal.  Neurological: She is alert and oriented to person, place, and time. She has normal reflexes. She displays normal reflexes. No cranial nerve deficit. She exhibits normal muscle tone. Coordination normal.  Craniall nerves II through XII intact.  Skin: Skin is warm and dry.  Psychiatric: She has a normal mood and affect. Her behavior is normal.    BP 130/78   Pulse 83   Temp 98.4 F (36.9 C) (Oral)   Ht 5\' 5"  (1.651 m)   Wt 162 lb (73.5 kg)   BMI 26.96 kg/m  Wt Readings from Last 3 Encounters:  07/19/19 162 lb (73.5 kg)  05/11/19 162 lb (73.5 kg)  01/27/19 147 lb (66.7 kg)     There are no preventive care reminders to display for this patient.  There are no preventive care reminders to display for this patient.  Lab Results  Component Value Date   TSH 2.17 01/13/2019   Lab Results  Component Value Date   WBC 9.3 07/25/2016   HGB 13.6 07/25/2016   HCT 40.3 07/25/2016   MCV 89.4 07/25/2016   PLT 241 07/25/2016   Lab Results  Component Value Date   NA 141 01/13/2019   K 4.5 01/13/2019   CO2 30 01/13/2019   GLUCOSE 93 01/13/2019   BUN 12 01/13/2019   CREATININE 0.71 01/13/2019   BILITOT 0.3 01/13/2019   ALKPHOS 57 07/25/2016   AST 15 01/13/2019   ALT 13 01/13/2019   PROT 6.9 01/13/2019    ALBUMIN 4.2 07/25/2016   CALCIUM 9.8 01/13/2019   ANIONGAP 8 07/25/2016   Lab Results  Component Value Date   CHOL 226 (H) 02/04/2018   Lab Results  Component Value Date   HDL 45 (L) 02/04/2018   Lab Results  Component Value Date   LDLCALC 154 (H) 02/04/2018   Lab Results  Component Value Date   TRIG 147 02/04/2018   Lab Results  Component Value Date   CHOLHDL 5.0 (H) 02/04/2018   No results found for: HGBA1C    Assessment & Plan:   Problem List Items Addressed This Visit      Cardiovascular and Mediastinum   HYPERTENSION, BENIGN - Primary    Well controlled. Continue current regimen. Follow up in  6 mo        Respiratory   COPD, mild (HCC)    Stable. No recent flares or exacerbations.        Other   Grief    If acute grief over the recent passing of her son.  Continue sertraline for now.  We also discussed may be referring her for grief counseling/therapy and she says she is open to that she would prefer to have someone in person instead of virtual.      Relevant Orders   Ambulatory referral to [facility]    Other Visit Diagnoses  Memory loss       Relevant Orders   MR Brain W Wo Contrast   Dysphagia, unspecified type         Dysphagia-she does have a history of an esophageal stricture and so I did encourage her to get back in touch with Dr. Tana Felts office and get scheduled for follow-up evaluation.  She will likely need repeat endoscopy.  Did have an unusual episode of memory loss on Sunday and says did not remember actually what it happened until the following day which is little bit unusual I do think some this could be stress and grief related but also cannot rule out a stroke.  Neurologic exam was normal today.  Will schedule MRI for further work-up.  No orders of the defined types were placed in this encounter.   Follow-up: Return in about 3 weeks (around 08/09/2019) for Mood.   Time spent 35 minutes in encounter.  Beatrice Lecher, MD

## 2019-07-19 NOTE — Assessment & Plan Note (Signed)
Well controlled. Continue current regimen. Follow up in  6 mo  

## 2019-07-19 NOTE — Assessment & Plan Note (Signed)
Stable.  No recent flares or exacerbations. 

## 2019-07-19 NOTE — Assessment & Plan Note (Signed)
If acute grief over the recent passing of her son.  Continue sertraline for now.  We also discussed may be referring her for grief counseling/therapy and she says she is open to that she would prefer to have someone in person instead of virtual.

## 2019-07-20 ENCOUNTER — Other Ambulatory Visit: Payer: Self-pay | Admitting: Family Medicine

## 2019-07-20 ENCOUNTER — Ambulatory Visit: Payer: Medicare HMO | Attending: Internal Medicine

## 2019-07-20 DIAGNOSIS — Z23 Encounter for immunization: Secondary | ICD-10-CM

## 2019-07-20 DIAGNOSIS — I10 Essential (primary) hypertension: Secondary | ICD-10-CM

## 2019-07-20 NOTE — Progress Notes (Signed)
Labs ordered for imaging needing contrast

## 2019-07-20 NOTE — Progress Notes (Signed)
   Covid-19 Vaccination Clinic  Name:  Beth [name]    MRN: [id number] DOB: March 02, 1933  07/20/2019  [name] was observed post Covid-19 immunization for 15 minutes without incident. She was provided with Vaccine Information Sheet and instruction to access the V-Safe system.   [name] was instructed to call 911 with any severe reactions post vaccine: Marland Kitchen Difficulty breathing  . Swelling of face and throat  . A fast heartbeat  . A bad rash all over body  . Dizziness and weakness   Immunizations Administered    Name Date Dose VIS Date Route   Pfizer COVID-19 Vaccine 07/20/2019 12:16 PM 0.3 mL 04/08/2019 Intramuscular   Manufacturer: [place]   Lot: [id number]   [place]: [id number]

## 2019-07-21 ENCOUNTER — Other Ambulatory Visit: Payer: Self-pay | Admitting: Family Medicine

## 2019-07-21 DIAGNOSIS — F411 Generalized anxiety disorder: Secondary | ICD-10-CM

## 2019-07-22 ENCOUNTER — Telehealth: Payer: Self-pay

## 2019-07-22 NOTE — Telephone Encounter (Signed)
Called patient and let her know that our MRI machine is broken and we will not be able to do her MRI on Monday. Patient stated " Good because I was not going to do it anyway"  Explained to patient that Dr Madilyn Fireman feels this is important to have done. Patient was agreeable to calling her insurance and seeing where locally they will cover to have this done and she will report to lab on Monday to have labs drawn. She will let me know where she is willing to go and I will change the auth location.

## 2019-07-25 ENCOUNTER — Other Ambulatory Visit: Payer: Medicare HMO

## 2019-07-25 DIAGNOSIS — I10 Essential (primary) hypertension: Secondary | ICD-10-CM | POA: Diagnosis not present

## 2019-07-25 LAB — COMPLETE METABOLIC PANEL WITH GFR
AG Ratio: 1.8 (calc) (ref 1.0–2.5)
ALT: 10 U/L (ref 6–29)
AST: 15 U/L (ref 10–35)
Albumin: 4.2 g/dL (ref 3.6–5.1)
Alkaline phosphatase (APISO): 74 U/L (ref 37–153)
BUN: 8 mg/dL (ref 7–25)
CO2: 31 mmol/L (ref 20–32)
Calcium: 9.6 mg/dL (ref 8.6–10.4)
Chloride: 103 mmol/L (ref 98–110)
Creat: 0.64 mg/dL (ref 0.60–0.88)
GFR, Est African American: 94 mL/min/{1.73_m2} (ref >=60)
GFR, Est Non African American: 81 mL/min/{1.73_m2} (ref >=60)
Globulin: 2.4 g/dL (calc) (ref 1.9–3.7)
Glucose, Bld: 107 mg/dL — ABNORMAL HIGH (ref 65–99)
Potassium: 4 mmol/L (ref 3.5–5.3)
Sodium: 140 mmol/L (ref 135–146)
Total Bilirubin: 0.5 mg/dL (ref 0.2–1.2)
Total Protein: 6.6 g/dL (ref 6.1–8.1)

## 2019-07-25 NOTE — Progress Notes (Signed)
All labs are normal. 

## 2019-08-08 ENCOUNTER — Encounter: Payer: Self-pay | Admitting: Family Medicine

## 2019-08-08 ENCOUNTER — Other Ambulatory Visit: Payer: Self-pay

## 2019-08-08 ENCOUNTER — Ambulatory Visit (INDEPENDENT_AMBULATORY_CARE_PROVIDER_SITE_OTHER): Payer: Medicare HMO | Admitting: Family Medicine

## 2019-08-08 VITALS — BP 135/76 | HR 73 | Ht 65.0 in | Wt 158.0 lb

## 2019-08-08 DIAGNOSIS — R413 Other amnesia: Secondary | ICD-10-CM

## 2019-08-08 DIAGNOSIS — R252 Cramp and spasm: Secondary | ICD-10-CM

## 2019-08-08 DIAGNOSIS — F4321 Adjustment disorder with depressed mood: Secondary | ICD-10-CM | POA: Diagnosis not present

## 2019-08-08 DIAGNOSIS — R69 Illness, unspecified: Secondary | ICD-10-CM | POA: Diagnosis not present

## 2019-08-08 NOTE — Assessment & Plan Note (Addendum)
Doing well on Cardizem.  Will make sure that she has refills.  Sure you are staying hydrated and stretching.

## 2019-08-08 NOTE — Assessment & Plan Note (Signed)
Discussed options.  We will continue sertraline for now she does feel it gets been helpful and has not had any side effects.  She is actually can check into grief therapy through hospice.

## 2019-08-08 NOTE — Progress Notes (Signed)
Established Patient Office Visit  Subjective:  Patient ID: Beth [name], female    DOB: 05-May-1932  Age: 86 y.o. MRN: [id number]  CC:  Chief Complaint  Patient presents with  . mood    HPI Beth [name] presents for follow-up grief.  Her son recently passed away from an underlying pulmonary condition.  Actually placed a behavioral health referral for her she said she did not hear from anybody.  Evidently they said they were not seeing people in person, virtual only.  But this information was not sent to me so that we could place a new referral.  In the meantime she spoke with a friend and is interested in maybe checking into grief therapy through hospice.  He says her biggest issue is that she just feels really lonely.    Also when I last saw her she had had an episode where she fell asleep but and then woke up completely disoriented not even realizing that she had fallen asleep.  There was some concern that she may have actually had a stroke.  We ordered a brain MRI unfortunately the machine was broken and she had to be rescheduled at that point she got frustrated and did not want to do it anymore.  She does have a history of atherosclerotic disease.  Her metabolic panel was normal at that time.  Hypertension- Pt denies chest pain, SOB, dizziness, or heart palpitations.  Taking meds as directed w/o problems.  Denies medication side effects.    Leg cramps-she has been on diltiazem for about the last 6 months and is doing well with it.  She says in fact she really has not had problems with her cramps up until about a week ago.  She says they were quite severe but feels like she was probably dehydrated she had not been eating or drinking very much.  As it is a started she increased her salt and water intake and they have resolved.  Past Medical History:  Diagnosis Date  . Allergy   . Anxiety    situational  . Arthritis   . Asthma   . Cataract   . COPD (chronic obstructive pulmonary  disease) (HCC)    PER PT  . Diverticulitis   . Diverticulitis   . Emphysema of lung ([place])   . Esophageal dysmotility   . Fibromyalgia   . Hiatal hernia   . Hyperlipidemia   . Hypertension   . Insomnia   . Osteopenia   . UTI (lower urinary tract infection)     Past Surgical History:  Procedure Laterality Date  . ABDOMINAL HYSTERECTOMY    . BLADDER SURGERY     tac  . COLONOSCOPY     2008    Family History  Problem Relation Age of Onset  . Cancer Mother        breast  . Hyperlipidemia Mother   . Hypertension Mother   . Dementia Mother   . Heart disease Father        MI  . Depression Father   . Hyperlipidemia Father   . Colon cancer Neg Hx   . Esophageal cancer Neg Hx   . Stomach cancer Neg Hx   . Rectal cancer Neg Hx     Social History   Socioeconomic History  . Marital status: Widowed    Spouse name: Not on file  . Number of children: 2  . Years of education: Not on file  . Highest education level: Not on  file  Occupational History  . Occupation: retired  Tobacco Use  . Smoking status: Former Research scientist (life sciences)  . Smokeless tobacco: Never Used  . Tobacco comment: smoker as a teenager  Substance and Sexual Activity  . Alcohol use: No    Alcohol/week: 0.0 standard drinks  . Drug use: No  . Sexual activity: Not on file  Other Topics Concern  . Not on file  Social History Narrative  . Not on file   Social Determinants of Health   Financial Resource Strain:   . Difficulty of Paying Living Expenses:   Food Insecurity:   . Worried About Charity fundraiser in the Last Year:   . Arboriculturist in the Last Year:   Transportation Needs:   . Film/video editor (Medical):   Marland Kitchen Lack of Transportation (Non-Medical):   Physical Activity:   . Days of Exercise per Week:   . Minutes of Exercise per Session:   Stress:   . Feeling of Stress :   Social Connections:   . Frequency of Communication with Friends and Family:   . Frequency of Social Gatherings with  Friends and Family:   . Attends Religious Services:   . Active Member of Clubs or Organizations:   . Attends Archivist Meetings:   Marland Kitchen Marital Status:   Intimate Partner Violence:   . Fear of Current or Ex-Partner:   . Emotionally Abused:   Marland Kitchen Physically Abused:   . Sexually Abused:     Outpatient Medications Prior to Visit  Medication Sig Dispense Refill  . albuterol (VENTOLIN HFA) 108 (90 Base) MCG/ACT inhaler Inhale 2 puffs into the lungs every 6 (six) hours as needed for wheezing or shortness of breath. 18 g prn  . calcium carbonate 1250 MG capsule Take 1,250 mg by mouth daily.     . Cholecalciferol (VITAMIN D3) 1000 UNITS tablet Take 1,000 Units by mouth daily.      . clonazePAM (KLONOPIN) 0.5 MG tablet Take 0.5-1 tablets (0.25-0.5 mg total) by mouth 2 (two) times daily as needed. This is a 30 days supply 45 tablet 0  . diltiazem (CARDIZEM SR) 60 MG 12 hr capsule Take 1 capsule (60 mg total) by mouth 2 (two) times daily. 60 capsule 1  . fexofenadine (ALLEGRA) 180 MG tablet Take 180 mg by mouth daily.    Marland Kitchen ibuprofen (ADVIL,MOTRIN) 200 MG tablet Take 200 mg by mouth every 6 (six) hours as needed.    . Multiple Vitamin (MULTIVITAMIN) tablet Take 1 tablet by mouth daily.      . Multiple Vitamins-Minerals (PRESERVISION AREDS 2+MULTI VIT) CAPS Take by mouth. Preservision AREDS-2    . naproxen sodium (ALEVE) 220 MG tablet Take 220 mg by mouth as needed.    Marland Kitchen omeprazole (PRILOSEC) 40 MG capsule Take 1 capsule (40 mg total) by mouth 2 (two) times daily at 10 AM and 5 PM. 90 capsule 3  . Pyridoxine HCl (VITAMIN B-6) 500 MG tablet Take 500 mg by mouth daily.      . sertraline (ZOLOFT) 50 MG tablet Take 1 tablet by mouth once daily 90 tablet 0  . vitamin B-12 (CYANOCOBALAMIN) 1000 MCG tablet Take 1,000 mcg by mouth daily.       No facility-administered medications prior to visit.    Allergies  Allergen Reactions  . Doxycycline Other (See Comments)    Blisters on mouth and lips  .  Pravachol [Pravastatin]     Fatigued.   . Trazodone And Nefazodone  Other (See Comments)    Made her hyper  . Zocor [Simvastatin] Other (See Comments)    Myalgias and memory loss    ROS Review of Systems    Objective:    Physical Exam  Constitutional: She is oriented to person, place, and time. She appears well-developed and well-nourished.  HENT:  Head: Normocephalic and atraumatic.  Cardiovascular: Normal rate, regular rhythm and normal heart sounds.  Pulmonary/Chest: Effort normal and breath sounds normal.  Neurological: She is alert and oriented to person, place, and time.  Skin: Skin is warm and dry.  Psychiatric: She has a normal mood and affect. Her behavior is normal.    BP 135/76   Pulse 73   Ht 5\' 5"  (1.651 m)   Wt 158 lb (71.7 kg)   SpO2 98%   BMI 26.29 kg/m  Wt Readings from Last 3 Encounters:  08/08/19 158 lb (71.7 kg)  07/19/19 162 lb (73.5 kg)  05/11/19 162 lb (73.5 kg)     There are no preventive care reminders to display for this patient.  There are no preventive care reminders to display for this patient.  Lab Results  Component Value Date   TSH 2.17 01/13/2019   Lab Results  Component Value Date   WBC 9.3 07/25/2016   HGB 13.6 07/25/2016   HCT 40.3 07/25/2016   MCV 89.4 07/25/2016   PLT 241 07/25/2016   Lab Results  Component Value Date   NA 140 07/25/2019   K 4.0 07/25/2019   CO2 31 07/25/2019   GLUCOSE 107 (H) 07/25/2019   BUN 8 07/25/2019   CREATININE 0.64 07/25/2019   BILITOT 0.5 07/25/2019   ALKPHOS 57 07/25/2016   AST 15 07/25/2019   ALT 10 07/25/2019   PROT 6.6 07/25/2019   ALBUMIN 4.2 07/25/2016   CALCIUM 9.6 07/25/2019   ANIONGAP 8 07/25/2016   Lab Results  Component Value Date   CHOL 226 (H) 02/04/2018   Lab Results  Component Value Date   HDL 45 (L) 02/04/2018   Lab Results  Component Value Date   LDLCALC 154 (H) 02/04/2018   Lab Results  Component Value Date   TRIG 147 02/04/2018   Lab Results   Component Value Date   CHOLHDL 5.0 (H) 02/04/2018   No results found for: HGBA1C    Assessment & Plan:   Problem List Items Addressed This Visit      Other   Muscle cramps    Doing well on Cardizem.  Will make sure that she has refills.  Sure you are staying hydrated and stretching.      Grief - Primary    Discussed options.  We will continue sertraline for now she does feel it gets been helpful and has not had any side effects.  She is actually can check into grief therapy through hospice.       Other Visit Diagnoses    Muscle cramp       Memory loss          Episode of memory loss-she said she decided not to get the MRI done.  Her initial appointment was canceled because the machine broke down.  But she did not reschedule.  She said she has not had any more episodes similar to that she is not had any sudden weakness or lapse in memory and would prefer to just hold off unless she develops any new symptoms which I think is reasonable at this point in time.  No orders of  the defined types were placed in this encounter.   Follow-up: Return in about 2 months (around 10/08/2019) for Mood  and blood pressure. .   Time spent 25 minutes.    Beatrice Lecher, MD

## 2019-08-10 ENCOUNTER — Other Ambulatory Visit: Payer: Self-pay | Admitting: Family Medicine

## 2019-08-13 ENCOUNTER — Other Ambulatory Visit: Payer: Self-pay | Admitting: Family Medicine

## 2019-09-06 ENCOUNTER — Other Ambulatory Visit: Payer: Self-pay | Admitting: Family Medicine

## 2019-09-09 ENCOUNTER — Telehealth: Payer: Self-pay

## 2019-09-09 MED ORDER — CLONAZEPAM 0.5 MG PO TABS: 0.2500 mg | ORAL_TABLET | Freq: Two times a day (BID) | ORAL | 0 refills | Status: DC | PRN

## 2019-09-09 NOTE — Telephone Encounter (Signed)
Patient advised. She agreed to take as directed. She is aware this is a 30 day supply.

## 2019-09-09 NOTE — Telephone Encounter (Signed)
I did go ahead and send the med today but she really has to start cutting the tabs in half.  I really don't want her dependant on it.

## 2019-09-09 NOTE — Telephone Encounter (Signed)
Beth [name] called and states she needs a refill on Clonazepam. I did advised the refill was too soon. I did explain the 40 tablets should last 30 days. She states she takes 1-2 tablets after last meal of the day. She states she is unable to sleep without this medication. She states she is still having trouble from the death of her son. I did advise 1-2 tablets in the evening is not how the prescription was written. Next refill will be due on 09/12/19. Please advise.   She was also upset they we have not been returning her calls. I did try to call her back yesterday but the number just rang. She states her number has been changed. I did update the chart with new phone number.

## 2019-09-12 DIAGNOSIS — Z961 Presence of intraocular lens: Secondary | ICD-10-CM | POA: Diagnosis not present

## 2019-09-12 DIAGNOSIS — H40013 Open angle with borderline findings, low risk, bilateral: Secondary | ICD-10-CM | POA: Diagnosis not present

## 2019-09-12 DIAGNOSIS — H04123 Dry eye syndrome of bilateral lacrimal glands: Secondary | ICD-10-CM | POA: Diagnosis not present

## 2019-09-12 DIAGNOSIS — H18513 Endothelial corneal dystrophy, bilateral: Secondary | ICD-10-CM | POA: Diagnosis not present

## 2019-09-12 DIAGNOSIS — H43812 Vitreous degeneration, left eye: Secondary | ICD-10-CM | POA: Diagnosis not present

## 2019-09-12 DIAGNOSIS — H353131 Nonexudative age-related macular degeneration, bilateral, early dry stage: Secondary | ICD-10-CM | POA: Diagnosis not present

## 2019-09-23 ENCOUNTER — Emergency Department (INDEPENDENT_AMBULATORY_CARE_PROVIDER_SITE_OTHER): Payer: Medicare HMO

## 2019-09-23 ENCOUNTER — Emergency Department (INDEPENDENT_AMBULATORY_CARE_PROVIDER_SITE_OTHER)
Admission: EM | Admit: 2019-09-23 | Discharge: 2019-09-23 | Disposition: A | Discharge status: Home / Self Care | Payer: Medicare HMO | Source: Home / Self Care

## 2019-09-23 ENCOUNTER — Other Ambulatory Visit: Payer: Self-pay

## 2019-09-23 DIAGNOSIS — W57XXXA Bitten or stung by nonvenomous insect and other nonvenomous arthropods, initial encounter: Secondary | ICD-10-CM

## 2019-09-23 DIAGNOSIS — J42 Unspecified chronic bronchitis: Secondary | ICD-10-CM | POA: Diagnosis not present

## 2019-09-23 DIAGNOSIS — R05 Cough: Secondary | ICD-10-CM

## 2019-09-23 DIAGNOSIS — J441 Chronic obstructive pulmonary disease with (acute) exacerbation: Secondary | ICD-10-CM

## 2019-09-23 DIAGNOSIS — S30860A Insect bite (nonvenomous) of lower back and pelvis, initial encounter: Secondary | ICD-10-CM

## 2019-09-23 MED ORDER — AZITHROMYCIN 250 MG PO TABS: 250.0000 mg | ORAL_TABLET | Freq: Every day | ORAL | 0 refills | Status: DC

## 2019-09-23 NOTE — Discharge Instructions (Signed)
  Please take your antibiotics as prescribed and follow up with Dr. Madilyn Fireman next week for recheck of symptoms.   Call 911 or go to the hospital if you develop chest pain, trouble breathing, dizziness, passing out, or other new concerning symptoms develop.

## 2019-09-23 NOTE — ED Provider Notes (Signed)
[facility]    CSN: [id number] Arrival date & time: 09/23/19  1552      History   Chief Complaint Chief Complaint  Patient presents with  . Cough    HPI Beth [name] is a 86 y.o. female.   HPI Beth [name] is a 86 y.o. female presenting to UC with hx of COPD c/o exacerbation over the last 1 week with Left "lower lung" soreness, worse with cough.  Cough is mildly productive.  She also reports difficulty sleeping, attributes to loss of her son in February.  She has been seen by her PCP for same, was prescribed clonazepam but states she needs a higher dose than what has been provided.  Pt denies chest pain, SOB, fever, chills, n/v/d.   Pt also reports noticing a tick on her Right back shoulder 2-3 days ago. She was able to remove it completely.  The area is now red and itchy. She has not tried any treatments at home.  Pt is allergic to doxycycline, pt reports "seeing colors," medical records notes pt develops blisters in her mouth.   Past Medical History:  Diagnosis Date  . Allergy   . Anxiety    situational  . Arthritis   . Asthma   . Cataract   . COPD (chronic obstructive pulmonary disease) (HCC)    PER PT  . Diverticulitis   . Diverticulitis   . Emphysema of lung ([place])   . Esophageal dysmotility   . Fibromyalgia   . Hiatal hernia   . Hyperlipidemia   . Hypertension   . Insomnia   . Osteopenia   . UTI (lower urinary tract infection)     Patient Active Problem List   Diagnosis Date Noted  . Grief 07/19/2019  . Multiple closed fractures of ribs of left side 05/12/2019  . Fall 05/12/2019  . Muscle cramps 01/27/2019  . Reactive airways dysfunction syndrome ([place]) 03/30/2018  . Memory impairment 02/04/2018  . Sciatica, left side 02/04/2018  . Recurrent UTI (urinary tract infection) 07/14/2017  . Cardiomegaly 11/06/2015  . History of recurrent UTIs 05/29/2015  . COPD, mild ([place]) 10/23/2014  . Laryngopharyngeal reflux (LPR) 10/23/2014  .  Headache(784.0) 01/17/2014  . Generalized anxiety disorder 01/16/2014  . Insomnia 01/16/2014  . Lactose intolerance 09/06/2013  . Ataxia 03/16/2011  . HYPERLIPIDEMIA 11/13/2010  . Reaction, situational, acute, to stress 09/09/2010  . MURMUR 05/11/2009  . URINARY URGENCY 05/11/2009  . Diaphragmatic hernia 03/14/2009  . MACULAR DEGENERATION, RIGHT EYE 12/06/2008  . HYPERTENSION, BENIGN 12/06/2008  . DIVERTICULITIS, COLON 12/06/2008  . FIBROMYALGIA 12/06/2008  . OSTEOPENIA 12/06/2008    Past Surgical History:  Procedure Laterality Date  . ABDOMINAL HYSTERECTOMY    . BLADDER SURGERY     tac  . COLONOSCOPY     2008    OB History   No obstetric history on file.      Home Medications    Prior to Admission medications   Medication Sig Start Date End Date Taking? Authorizing Provider  albuterol (VENTOLIN HFA) 108 (90 Base) MCG/ACT inhaler Inhale 2 puffs into the lungs every 6 (six) hours as needed for wheezing or shortness of breath. 01/28/19   Hali Marry, MD  azithromycin (ZITHROMAX) 250 MG tablet Take 1 tablet (250 mg total) by mouth daily. Take first 2 tablets together, then 1 every day until finished. 09/23/19   Noe Gens, PA-C  calcium carbonate 1250 MG capsule Take 1,250 mg by mouth daily.  [provider]  Cholecalciferol (VITAMIN D3) 1000 UNITS tablet Take 1,000 Units by mouth daily.      [provider]  clonazePAM (KLONOPIN) 0.5 MG tablet Take 0.5-1 tablets (0.25-0.5 mg total) by mouth 2 (two) times daily as needed for anxiety. This is a 30-day supply. 09/09/19   Hali Marry, MD  diltiazem (CARDIZEM SR) 60 MG 12 hr capsule Take 1 capsule (60 mg total) by mouth 2 (two) times daily. 07/18/19   Hali Marry, MD  fexofenadine (ALLEGRA) 180 MG tablet Take 180 mg by mouth daily.    [provider]  ibuprofen (ADVIL,MOTRIN) 200 MG tablet Take 200 mg by mouth every 6 (six) hours as needed.    [provider]    Multiple Vitamin (MULTIVITAMIN) tablet Take 1 tablet by mouth daily.      [provider]  Multiple Vitamins-Minerals (PRESERVISION AREDS 2+MULTI VIT) CAPS Take by mouth. Preservision AREDS-2    [provider]  naproxen sodium (ALEVE) 220 MG tablet Take 220 mg by mouth as needed.    [provider]  omeprazole (PRILOSEC) 40 MG capsule Take 1 capsule (40 mg total) by mouth 2 (two) times daily at 10 AM and 5 PM. 04/19/18   Lemmon, Lavone Nian, PA  Pyridoxine HCl (VITAMIN B-6) 500 MG tablet Take 500 mg by mouth daily.      [provider]  sertraline (ZOLOFT) 50 MG tablet Take 1 tablet by mouth once daily 07/21/19   Hali Marry, MD  vitamin B-12 (CYANOCOBALAMIN) 1000 MCG tablet Take 1,000 mcg by mouth daily.      [provider]    Family History Family History  Problem Relation Age of Onset  . Cancer Mother        breast  . Hyperlipidemia Mother   . Hypertension Mother   . Dementia Mother   . Heart disease Father        MI  . Depression Father   . Hyperlipidemia Father   . Colon cancer Neg Hx   . Esophageal cancer Neg Hx   . Stomach cancer Neg Hx   . Rectal cancer Neg Hx     Social History Social History   Tobacco Use  . Smoking status: Former Research scientist (life sciences)  . Smokeless tobacco: Never Used  . Tobacco comment: smoker as a teenager  Substance Use Topics  . Alcohol use: No    Alcohol/week: 0.0 standard drinks    Comment: did buy a bottle of wine this week to help her sleep  . Drug use: No     Allergies   Doxycycline, Pravachol [pravastatin], Trazodone and nefazodone, and Zocor [simvastatin]   Review of Systems Review of Systems   Physical Exam Triage Vital Signs ED Triage Vitals  Enc Vitals Group     BP 09/23/19 1603 (!) 144/78     Pulse Rate 09/23/19 1603 77     Resp 09/23/19 1603 16     Temp 09/23/19 1603 98.5 F (36.9 C)     Temp Source 09/23/19 1603 Oral     SpO2 09/23/19 1603 98 %     Weight --       Height --      Head Circumference --      Peak Flow --      Pain Score 09/23/19 1600 4     Pain Loc --      Pain Edu? --      Excl. in [place]? --  No data found.  Updated Vital Signs BP (!) 144/78 (BP Location: Right Arm)   Pulse 77   Temp 98.5 F (36.9 C) (Oral)   Resp 16   SpO2 98%   Visual Acuity Right Eye Distance:   Left Eye Distance:   Bilateral Distance:    Right Eye Near:   Left Eye Near:    Bilateral Near:     Physical Exam Vitals and nursing note reviewed.  Constitutional:      Appearance: Normal appearance. She is well-developed.  HENT:     Head: Normocephalic and atraumatic.     Right Ear: Tympanic membrane and ear canal normal.     Left Ear: Tympanic membrane and ear canal normal.     Nose: Nose normal.     Mouth/Throat:     Lips: Pink.     Mouth: Mucous membranes are moist.     Pharynx: Oropharynx is clear. Uvula midline.  Cardiovascular:     Rate and Rhythm: Normal rate and regular rhythm.  Pulmonary:     Effort: Pulmonary effort is normal. No respiratory distress.     Breath sounds: Normal breath sounds. No stridor. No wheezing, rhonchi or rales.  Chest:     Chest wall: No tenderness.  Musculoskeletal:        General: Normal range of motion.     Cervical back: Normal range of motion.  Skin:    General: Skin is warm and dry.     Findings: Erythema present.       Neurological:     Mental Status: She is alert and oriented to person, place, and time.  Psychiatric:        Mood and Affect: Affect is tearful ( when discussing her son).        Behavior: Behavior normal.      UC Treatments / Results  Labs (all labs ordered are listed, but only abnormal results are displayed) Labs Reviewed - No data to display  EKG   Radiology DG Chest 2 View  Result Date: 09/23/2019 CLINICAL DATA:  Right lower back and rib pain. Productive cough at night for the past week. Removed a tick 3 days ago. EXAM: CHEST - 2 VIEW COMPARISON:  05/20/2019 FINDINGS:  Stable borderline enlarged cardiac silhouette and mildly hyperexpanded lungs with mild diffuse peribronchial thickening. Linear atelectasis or scarring at the left lung base with improvement. No pleural fluid. Thoracic spine degenerative changes. Bilateral shoulder degenerative changes. Diffuse osteopenia. IMPRESSION: 1. Linear atelectasis or scarring at the left lung base with improvement. 2. Stable mild changes of COPD and chronic bronchitis. Electronically Signed   By: Claudie Revering M.D.   On: 09/23/2019 17:17    Procedures Procedures (including critical care time)  Medications Ordered in UC Medications - No data to display  Initial Impression / Assessment and Plan / UC Course  I have reviewed the triage vital signs and the nursing notes.  Pertinent labs & imaging results that were available during my care of the patient were reviewed by me and considered in my medical decision making (see chart for details).     Discussed imaging with pt CXR Is reassing Due to unknown amount of time tick was in place, will start pt on azithromycin (pt allergic to doxycycline)  Encouraged f/u with PCP, depending on location of tick bite and response to medication, pt may need longer duration. Encouraged pt to f/u with PCP about sleeping difficulty as well.  AVS provided  Final Clinical Impressions(s) /  UC Diagnoses   Final diagnoses:  COPD exacerbation ([place])  Tick bite of back, initial encounter     Discharge Instructions      Please take your antibiotics as prescribed and follow up with Dr. Madilyn Fireman next week for recheck of symptoms.   Call 911 or go to the hospital if you develop chest pain, trouble breathing, dizziness, passing out, or other new concerning symptoms develop.     ED Prescriptions    Medication Sig Dispense Auth. Provider   azithromycin (ZITHROMAX) 250 MG tablet Take 1 tablet (250 mg total) by mouth daily. Take first 2 tablets together, then 1 every day until finished. 6  tablet Noe Gens, PA-C     PDMP not reviewed this encounter.   Noe Gens, [place] 09/24/19 1239

## 2019-09-23 NOTE — ED Triage Notes (Signed)
Patient presents to Urgent Care with complaints of cough and sore place on her "lower lung" on the left side since about a week ago. Patient reports she also has not been able to sleep more than an hour or two for three nights in a row. Patient found a tick on her shoulder 2-3 days ago, unsure if that is related to her symptoms.

## 2019-10-10 ENCOUNTER — Encounter: Payer: Self-pay | Admitting: Family Medicine

## 2019-10-10 ENCOUNTER — Ambulatory Visit: Payer: Medicare HMO | Admitting: Family Medicine

## 2019-10-10 ENCOUNTER — Ambulatory Visit (INDEPENDENT_AMBULATORY_CARE_PROVIDER_SITE_OTHER): Payer: Medicare HMO | Admitting: Family Medicine

## 2019-10-10 ENCOUNTER — Other Ambulatory Visit: Payer: Self-pay

## 2019-10-10 VITALS — BP 130/69 | HR 80 | Ht 65.0 in | Wt 152.0 lb

## 2019-10-10 DIAGNOSIS — J449 Chronic obstructive pulmonary disease, unspecified: Secondary | ICD-10-CM | POA: Diagnosis not present

## 2019-10-10 DIAGNOSIS — R252 Cramp and spasm: Secondary | ICD-10-CM | POA: Diagnosis not present

## 2019-10-10 DIAGNOSIS — F4321 Adjustment disorder with depressed mood: Secondary | ICD-10-CM | POA: Diagnosis not present

## 2019-10-10 DIAGNOSIS — I10 Essential (primary) hypertension: Secondary | ICD-10-CM | POA: Diagnosis not present

## 2019-10-10 DIAGNOSIS — R69 Illness, unspecified: Secondary | ICD-10-CM | POA: Diagnosis not present

## 2019-10-10 DIAGNOSIS — F5101 Primary insomnia: Secondary | ICD-10-CM | POA: Diagnosis not present

## 2019-10-10 DIAGNOSIS — F411 Generalized anxiety disorder: Secondary | ICD-10-CM

## 2019-10-10 MED ORDER — DILTIAZEM HCL ER 60 MG PO CP12: 60.0000 mg | ORAL_CAPSULE | Freq: Two times a day (BID) | ORAL | 1 refills | Status: DC

## 2019-10-10 MED ORDER — DOXEPIN HCL 3 MG PO TABS: 1.0000 | ORAL_TABLET | Freq: Every day | ORAL | 1 refills | Status: DC

## 2019-10-10 NOTE — Assessment & Plan Note (Signed)
This encouraged her to use her albuterol if she gets a little bit winded or short of breath if at any point she notices increase shortness of breath that is persistent or cough etc. then please let me know.

## 2019-10-10 NOTE — Assessment & Plan Note (Signed)
Well controlled. Continue current regimen. Follow up in  6 mo  

## 2019-10-10 NOTE — Assessment & Plan Note (Signed)
Gust a trial of doxepin low-dose instead of the clonazepam for sleep to see if this is helpful.  If not then please let me know.  Did encourage her to be careful and not to mix them.

## 2019-10-10 NOTE — Progress Notes (Signed)
Established Patient Office Visit  Subjective:  Patient ID: Beth [name], female    DOB: [birth year]  Age: 86 y.o. MRN: [id number]  CC:  Chief Complaint  Patient presents with  . mood    HPI Beth [name] presents for follow-up mood/grief-she is currently on sertraline after the loss of her son.  She says overall she is doing okay she is really been getting out and doing a lot of yard work and says it is actually been really helpful to keep her mind busy and feels like overall she is coping well.  She is still not sleeping well she has been trying to cut back on the clonazepam but says when she does she does not sleep.  She did cut out all coffee about 3 weeks ago.  Is also seen at the urgent care recently for a tick bite.  She is that seems to have healed she did take the antibiotic that was she was given.  He does have reactive airway disease and mild COPD and says occasionally she just feels short of breath particularly when she is tired.  She does report that she has an albuterol inhaler.  Past Medical History:  Diagnosis Date  . Allergy   . Anxiety    situational  . Arthritis   . Asthma   . Cataract   . COPD (chronic obstructive pulmonary disease) (HCC)    PER PT  . Diverticulitis   . Diverticulitis   . Emphysema of lung ([place])   . Esophageal dysmotility   . Fibromyalgia   . Hiatal hernia   . Hyperlipidemia   . Hypertension   . Insomnia   . Osteopenia   . UTI (lower urinary tract infection)     Past Surgical History:  Procedure Laterality Date  . ABDOMINAL HYSTERECTOMY    . BLADDER SURGERY     tac  . COLONOSCOPY     2008    Family History  Problem Relation Age of Onset  . Cancer Mother        breast  . Hyperlipidemia Mother   . Hypertension Mother   . Dementia Mother   . Heart disease Father        MI  . Depression Father   . Hyperlipidemia Father   . Colon cancer Neg Hx   . Esophageal cancer Neg Hx   . Stomach cancer Neg Hx   . Rectal cancer Neg  Hx     Social History   Socioeconomic History  . Marital status: Widowed    Spouse name: Not on file  . Number of children: 2  . Years of education: Not on file  . Highest education level: Not on file  Occupational History  . Occupation: retired  Tobacco Use  . Smoking status: Former Research scientist (life sciences)  . Smokeless tobacco: Never Used  . Tobacco comment: smoker as a teenager  Vaping Use  . Vaping Use: Never used  Substance and Sexual Activity  . Alcohol use: No    Alcohol/week: 0.0 standard drinks    Comment: did buy a bottle of wine this week to help her sleep  . Drug use: No  . Sexual activity: Not on file  Other Topics Concern  . Not on file  Social History Narrative  . Not on file   Social Determinants of Health   Financial Resource Strain:   . Difficulty of Paying Living Expenses:   Food Insecurity:   . Worried About Charity fundraiser in  the Last Year:   . [place] in the Last Year:   Transportation Needs:   . Film/video editor (Medical):   Marland Kitchen Lack of Transportation (Non-Medical):   Physical Activity:   . Days of Exercise per Week:   . Minutes of Exercise per Session:   Stress:   . Feeling of Stress :   Social Connections:   . Frequency of Communication with Friends and Family:   . Frequency of Social Gatherings with Friends and Family:   . Attends Religious Services:   . Active Member of Clubs or Organizations:   . Attends Archivist Meetings:   Marland Kitchen Marital Status:   Intimate Partner Violence:   . Fear of Current or Ex-Partner:   . Emotionally Abused:   Marland Kitchen Physically Abused:   . Sexually Abused:     Outpatient Medications Prior to Visit  Medication Sig Dispense Refill  . albuterol (VENTOLIN HFA) 108 (90 Base) MCG/ACT inhaler Inhale 2 puffs into the lungs every 6 (six) hours as needed for wheezing or shortness of breath. 18 g prn  . calcium carbonate 1250 MG capsule Take 1,250 mg by mouth daily.     . Cholecalciferol (VITAMIN D3) 1000 UNITS  tablet Take 1,000 Units by mouth daily.      . clonazePAM (KLONOPIN) 0.5 MG tablet Take 0.5-1 tablets (0.25-0.5 mg total) by mouth 2 (two) times daily as needed for anxiety. This is a 30-day supply. 40 tablet 0  . fexofenadine (ALLEGRA) 180 MG tablet Take 180 mg by mouth daily.    Marland Kitchen ibuprofen (ADVIL,MOTRIN) 200 MG tablet Take 200 mg by mouth every 6 (six) hours as needed.    . Multiple Vitamin (MULTIVITAMIN) tablet Take 1 tablet by mouth daily.      . Multiple Vitamins-Minerals (PRESERVISION AREDS 2+MULTI VIT) CAPS Take by mouth. Preservision AREDS-2    . naproxen sodium (ALEVE) 220 MG tablet Take 220 mg by mouth as needed.    Marland Kitchen omeprazole (PRILOSEC) 40 MG capsule Take 1 capsule (40 mg total) by mouth 2 (two) times daily at 10 AM and 5 PM. 90 capsule 3  . Pyridoxine HCl (VITAMIN B-6) 500 MG tablet Take 500 mg by mouth daily.      . sertraline (ZOLOFT) 50 MG tablet Take 1 tablet by mouth once daily 90 tablet 0  . vitamin B-12 (CYANOCOBALAMIN) 1000 MCG tablet Take 1,000 mcg by mouth daily.      Marland Kitchen diltiazem (CARDIZEM SR) 60 MG 12 hr capsule Take 1 capsule (60 mg total) by mouth 2 (two) times daily. 60 capsule 1  . azithromycin (ZITHROMAX) 250 MG tablet Take 1 tablet (250 mg total) by mouth daily. Take first 2 tablets together, then 1 every day until finished. 6 tablet 0   No facility-administered medications prior to visit.    Allergies  Allergen Reactions  . Doxycycline Other (See Comments)    Blisters on mouth and lips  . Pravachol [Pravastatin]     Fatigued.   . Trazodone And Nefazodone Other (See Comments)    Made her hyper  . Zocor [Simvastatin] Other (See Comments)    Myalgias and memory loss    ROS Review of Systems    Objective:    Physical Exam Constitutional:      Appearance: She is well-developed.  HENT:     Head: Normocephalic and atraumatic.  Cardiovascular:     Rate and Rhythm: Normal rate and regular rhythm.     Heart sounds: Normal heart  sounds.  Pulmonary:      Effort: Pulmonary effort is normal.     Breath sounds: Normal breath sounds.  Skin:    General: Skin is warm and dry.  Neurological:     Mental Status: She is alert and oriented to person, place, and time.  Psychiatric:        Behavior: Behavior normal.     BP 130/69   Pulse 80   Ht 5\' 5"  (1.651 m)   Wt 152 lb (68.9 kg)   SpO2 97%   BMI 25.29 kg/m  Wt Readings from Last 3 Encounters:  10/10/19 152 lb (68.9 kg)  08/08/19 158 lb (71.7 kg)  07/19/19 162 lb (73.5 kg)     There are no preventive care reminders to display for this patient.  There are no preventive care reminders to display for this patient.  Lab Results  Component Value Date   TSH 2.17 01/13/2019   Lab Results  Component Value Date   WBC 9.3 07/25/2016   HGB 13.6 07/25/2016   HCT 40.3 07/25/2016   MCV 89.4 07/25/2016   PLT 241 07/25/2016   Lab Results  Component Value Date   NA 140 07/25/2019   K 4.0 07/25/2019   CO2 31 07/25/2019   GLUCOSE 107 (H) 07/25/2019   BUN 8 07/25/2019   CREATININE 0.64 07/25/2019   BILITOT 0.5 07/25/2019   ALKPHOS 57 07/25/2016   AST 15 07/25/2019   ALT 10 07/25/2019   PROT 6.6 07/25/2019   ALBUMIN 4.2 07/25/2016   CALCIUM 9.6 07/25/2019   ANIONGAP 8 07/25/2016   Lab Results  Component Value Date   CHOL 226 (H) 02/04/2018   Lab Results  Component Value Date   HDL 45 (L) 02/04/2018   Lab Results  Component Value Date   LDLCALC 154 (H) 02/04/2018   Lab Results  Component Value Date   TRIG 147 02/04/2018   Lab Results  Component Value Date   CHOLHDL 5.0 (H) 02/04/2018   No results found for: HGBA1C    Assessment & Plan:   Problem List Items Addressed This Visit      Cardiovascular and Mediastinum   HYPERTENSION, BENIGN    Well controlled. Continue current regimen. Follow up in  6 mo      Relevant Medications   diltiazem (CARDIZEM SR) 60 MG 12 hr capsule     Respiratory   COPD, mild ([place])    This encouraged her to use her albuterol if she  gets a little bit winded or short of breath if at any point she notices increase shortness of breath that is persistent or cough etc. then please let me know.        Other   Insomnia - Primary    Gust a trial of doxepin low-dose instead of the clonazepam for sleep to see if this is helpful.  If not then please let me know.  Did encourage her to be careful and not to mix them.      Relevant Medications   Doxepin HCl 3 MG TABS   Grief    Overall she is doing well with her sertraline.  Plan to follow-up in 4 to 6 months if at that point she is doing well we could consider discontinuing the medication if she still doing well.  Continue to work on decreasing clonazepam use.      Generalized anxiety disorder    Has beenContinuing to wean clonazepam use.  She is splitting them in half  which is great.  In fact we last filled a 30-day supply about 40 days ago.       Other Visit Diagnoses    Muscle cramp       Relevant Medications   diltiazem (CARDIZEM SR) 60 MG 12 hr capsule      Meds ordered this encounter  Medications  . diltiazem (CARDIZEM SR) 60 MG 12 hr capsule    Sig: Take 1 capsule (60 mg total) by mouth 2 (two) times daily.    Dispense:  60 capsule    Refill:  1  . Doxepin HCl 3 MG TABS    Sig: Take 1 tablet (3 mg total) by mouth at bedtime.    Dispense:  30 tablet    Refill:  1    Follow-up: Return in about 4 months (around 02/09/2020) for Hypertension and Mood.    Beatrice Lecher, MD

## 2019-10-10 NOTE — Assessment & Plan Note (Signed)
Overall she is doing well with her sertraline.  Plan to follow-up in 4 to 6 months if at that point she is doing well we could consider discontinuing the medication if she still doing well.  Continue to work on decreasing clonazepam use.

## 2019-10-10 NOTE — Assessment & Plan Note (Signed)
Has beenContinuing to wean clonazepam use.  She is splitting them in half which is great.  In fact we last filled a 30-day supply about 40 days ago.

## 2019-10-31 ENCOUNTER — Other Ambulatory Visit: Payer: Self-pay | Admitting: Family Medicine

## 2019-10-31 DIAGNOSIS — F411 Generalized anxiety disorder: Secondary | ICD-10-CM

## 2019-11-02 ENCOUNTER — Telehealth: Payer: Self-pay | Admitting: *Deleted

## 2019-11-02 NOTE — Telephone Encounter (Signed)
LVM advising pt of drug interaction of the Sertraline and Doxepin. Advised her that she [place]. AND THAT SHE SHOULD TAKE THE Sertraline(Zoloft) during the day and the Doxepin at evening/bedtime. Asked that she call back if she has any questions.

## 2019-11-17 ENCOUNTER — Telehealth: Payer: Self-pay

## 2019-11-17 DIAGNOSIS — F5101 Primary insomnia: Secondary | ICD-10-CM

## 2019-11-17 NOTE — Telephone Encounter (Signed)
Nealy states the Doxepin is not helping her sleep. She is extremely tired and wants something to help her sleep. She states she has taken Tranxene in the past. She is very upset and is feels foggy headed. Please advise.

## 2019-11-18 NOTE — Telephone Encounter (Signed)
I don't write for Tranzene.  Would she like to try a low dose of Ambien or LUnesta. I know she can't take trazodone.

## 2019-11-18 NOTE — Telephone Encounter (Signed)
Left message advising of recommendations.  

## 2019-11-22 MED ORDER — ZOLPIDEM TARTRATE 5 MG PO TABS: 2.5000 mg | ORAL_TABLET | Freq: Every evening | ORAL | 1 refills | Status: DC | PRN

## 2019-11-22 NOTE — Telephone Encounter (Signed)
She is ok with the Ambien.

## 2019-11-22 NOTE — Telephone Encounter (Signed)
sent 

## 2019-11-23 NOTE — Telephone Encounter (Signed)
Received a fax from [facility] that Zolpidem was approved from 04/29/2019 through 04/27/2020. Pharmacy aware and forms sent to scan.

## 2019-11-29 DIAGNOSIS — N3941 Urge incontinence: Secondary | ICD-10-CM | POA: Diagnosis not present

## 2019-11-29 DIAGNOSIS — R350 Frequency of micturition: Secondary | ICD-10-CM | POA: Diagnosis not present

## 2019-11-29 DIAGNOSIS — N3020 Other chronic cystitis without hematuria: Secondary | ICD-10-CM | POA: Diagnosis not present

## 2019-12-07 DIAGNOSIS — R69 Illness, unspecified: Secondary | ICD-10-CM | POA: Diagnosis not present

## 2020-01-02 ENCOUNTER — Other Ambulatory Visit: Payer: Self-pay | Admitting: Family Medicine

## 2020-01-02 DIAGNOSIS — F5101 Primary insomnia: Secondary | ICD-10-CM

## 2020-01-23 ENCOUNTER — Other Ambulatory Visit: Payer: Self-pay | Admitting: Family Medicine

## 2020-01-23 DIAGNOSIS — F5101 Primary insomnia: Secondary | ICD-10-CM

## 2020-01-25 NOTE — Telephone Encounter (Signed)
Task completed. Contacted patient regarding provider's note. Per pt, she has been taking one tab every night. She continues to have sleep issues. Pt states that the rx does not work well for her. She has an upcoming appointment with provider. Pt would like to discuss other sleep medications during her appointment. No other inquiries during the call.

## 2020-01-25 NOTE — Telephone Encounter (Signed)
Call pt: needs to schedule a f/u in regards to the new sleep aid. Also remind her to use PRN not every night.  And can even use half a tab some night.s

## 2020-01-31 ENCOUNTER — Ambulatory Visit (INDEPENDENT_AMBULATORY_CARE_PROVIDER_SITE_OTHER): Payer: Medicare HMO | Admitting: Family Medicine

## 2020-01-31 ENCOUNTER — Encounter: Payer: Self-pay | Admitting: Family Medicine

## 2020-01-31 VITALS — BP 171/64 | HR 75 | Wt 146.1 lb

## 2020-01-31 DIAGNOSIS — R209 Unspecified disturbances of skin sensation: Secondary | ICD-10-CM | POA: Diagnosis not present

## 2020-01-31 DIAGNOSIS — F5102 Adjustment insomnia: Secondary | ICD-10-CM | POA: Diagnosis not present

## 2020-01-31 DIAGNOSIS — R252 Cramp and spasm: Secondary | ICD-10-CM

## 2020-01-31 DIAGNOSIS — I10 Essential (primary) hypertension: Secondary | ICD-10-CM | POA: Diagnosis not present

## 2020-01-31 DIAGNOSIS — F430 Acute stress reaction: Secondary | ICD-10-CM

## 2020-01-31 DIAGNOSIS — F4321 Adjustment disorder with depressed mood: Secondary | ICD-10-CM

## 2020-01-31 DIAGNOSIS — R69 Illness, unspecified: Secondary | ICD-10-CM | POA: Diagnosis not present

## 2020-01-31 DIAGNOSIS — Z23 Encounter for immunization: Secondary | ICD-10-CM | POA: Diagnosis not present

## 2020-01-31 DIAGNOSIS — H0520 Unspecified exophthalmos: Secondary | ICD-10-CM | POA: Diagnosis not present

## 2020-01-31 MED ORDER — DILTIAZEM HCL ER 60 MG PO CP12: 60.0000 mg | ORAL_CAPSULE | Freq: Two times a day (BID) | ORAL | 1 refills | Status: DC

## 2020-01-31 MED ORDER — ESZOPICLONE 2 MG PO TABS: 2.0000 mg | ORAL_TABLET | Freq: Every evening | ORAL | 0 refills | Status: DC | PRN

## 2020-01-31 NOTE — Assessment & Plan Note (Signed)
Oddly, seems to only affect the left eye. Discussed that we should recheck her thyroid level last checked about a year ago it looks great at that time. Also follow-up with her eye doctor for further work-up as well. May need additional imaging including MRI but I will leave that up to them.

## 2020-01-31 NOTE — Addendum Note (Signed)
Addended by: Beatrice Lecher D on: 01/31/2020 08:10 PM   Modules accepted: Orders

## 2020-01-31 NOTE — Assessment & Plan Note (Signed)
No known history of peripheral vascular disease. Though I do not see any ABIs or Dopplers on file for her recently to completely rule this out. That she has good color of her feet. We will continue to monitor and can address further at next office visit. Did encourage her to hydrate more regularly.

## 2020-01-31 NOTE — Assessment & Plan Note (Signed)
Still struggling with poor sleep quality I do feel like some of this is aggravated by recent grief reaction. We will discontinue Ambien and give Lunesta a trial. We will start with 2 mg we also discussed strategy and moving her sleep schedule backwards or if she is currently is going to bed and sleeping around 3 AM to move that back to 245 for 2 weeks and then to 30 for 2 weeks and on and on. We also discussed taking the sleeping medication about 30 minutes before bedtime so that has that opportunity to take effect by the time she lays down. If she feels like it is too sedating let me know. Okay to stay in bed for 8 hours she does not have to make herself get up earlier if she is going to bed later. She would really like to get her bedtime adjusted to go to bed around 11 or 1130.

## 2020-01-31 NOTE — Assessment & Plan Note (Signed)
Uncontrolled today. Her blood pressure was a little better in August but still not well controlled. Circled on her medication sheet the diltiazem will need to send in a refill today so she can start that back. Would like to see her back in a couple weeks to make sure that her blood pressure is now well controlled.

## 2020-01-31 NOTE — Assessment & Plan Note (Signed)
Also concurrent history of generalized anxiety disorder. Continue sertraline for now. Currently on 50 mg consider increasing the dose to 100 mg especially if the sleep medication is not helping at that point it would be her third medication that she has tried in which case I would strongly suspect that the insomnia is somewhat mood related.

## 2020-01-31 NOTE — Progress Notes (Addendum)
Established Patient Office Visit  Subjective:  Patient ID: Beth [name], female    DOB: 1932/09/23  Age: 86 y.o. MRN: [id number]  CC:  Chief Complaint  Patient presents with  . Follow-up  . Medication Problem    Zolpidem    HPI Beth [name] presents for increased stress and sleep problems. Her son had passed away earlier this year with whom she lives and he did quite a bit for her very supportive. So now she is living alone and dealing with a lot of stressors. She says her daughter called her last night and said that she was not to be able to help her with the house or help herself at and that has really felt made her feel stressed and overwhelmed.  She has not been sleeping well for months. She was mainly relying on clonazepam to help her sleep even before her son passed away and we had discussed trying something else for sleep. More recently she has been falling asleep around 3 AM and then oftentimes gets up around 10 AM but sometimes will sleep in past noon. She rarely drinks caffeine. She had tried doxepin but felt like it was not helpful. So we had been tried a low-dose of Ambien she started with 2.5 mg and says she really did not notice much of a difference and never really got a chance to try much more than that for very long. She felt like again it was ineffective.  She also brought in her medications with her today as she has been quite confused about her medications she was not sure which one was for blood pressure. She has not been taking the diltiazem.  She is also concerned about her left eyes she feels like it is bulging out compared to her right she does have an upcoming appointment with her eye doctor soon.  She complains of feeling like her feet feel numb and cold at times she says she has put heat packs and wraps on them to try to get them warm she never gets any burning or stinging.   She also complains that her skin is excessively dry. And feels like she probably does  not drink enough water sometimes only drink 1 bottle a day but the most usually about 3.  Past Medical History:  Diagnosis Date  . Allergy   . Anxiety    situational  . Arthritis   . Asthma   . Cataract   . COPD (chronic obstructive pulmonary disease) (HCC)    PER PT  . Diverticulitis   . Diverticulitis   . Emphysema of lung ([place])   . Esophageal dysmotility   . Fibromyalgia   . Hiatal hernia   . Hyperlipidemia   . Hypertension   . Insomnia   . Osteopenia   . UTI (lower urinary tract infection)     Past Surgical History:  Procedure Laterality Date  . ABDOMINAL HYSTERECTOMY    . BLADDER SURGERY     tac  . COLONOSCOPY     2008    Family History  Problem Relation Age of Onset  . Cancer Mother        breast  . Hyperlipidemia Mother   . Hypertension Mother   . Dementia Mother   . Heart disease Father        MI  . Depression Father   . Hyperlipidemia Father   . Colon cancer Neg Hx   . Esophageal cancer Neg Hx   . Stomach  cancer Neg Hx   . Rectal cancer Neg Hx     Social History   Socioeconomic History  . Marital status: Widowed    Spouse name: Not on file  . Number of children: 2  . Years of education: Not on file  . Highest education level: Not on file  Occupational History  . Occupation: retired  Tobacco Use  . Smoking status: Former Research scientist (life sciences)  . Smokeless tobacco: Never Used  . Tobacco comment: smoker as a teenager  Vaping Use  . Vaping Use: Never used  Substance and Sexual Activity  . Alcohol use: No    Alcohol/week: 0.0 standard drinks    Comment: did buy a bottle of wine this week to help her sleep  . Drug use: No  . Sexual activity: Not on file  Other Topics Concern  . Not on file  Social History Narrative  . Not on file   Social Determinants of Health   Financial Resource Strain:   . Difficulty of Paying Living Expenses: Not on file  Food Insecurity:   . Worried About Charity fundraiser in the Last Year: Not on file  . Ran Out of Food  in the Last Year: Not on file  Transportation Needs:   . Lack of Transportation (Medical): Not on file  . Lack of Transportation (Non-Medical): Not on file  Physical Activity:   . Days of Exercise per Week: Not on file  . Minutes of Exercise per Session: Not on file  Stress:   . Feeling of Stress : Not on file  Social Connections:   . Frequency of Communication with Friends and Family: Not on file  . Frequency of Social Gatherings with Friends and Family: Not on file  . Attends Religious Services: Not on file  . Active Member of Clubs or Organizations: Not on file  . Attends Archivist Meetings: Not on file  . Marital Status: Not on file  Intimate Partner Violence:   . Fear of Current or Ex-Partner: Not on file  . Emotionally Abused: Not on file  . Physically Abused: Not on file  . Sexually Abused: Not on file    Outpatient Medications Prior to Visit  Medication Sig Dispense Refill  . albuterol (VENTOLIN HFA) 108 (90 Base) MCG/ACT inhaler Inhale 2 puffs into the lungs every 6 (six) hours as needed for wheezing or shortness of breath. 18 g prn  . calcium carbonate 1250 MG capsule Take 1,250 mg by mouth daily.     . Cholecalciferol (VITAMIN D3) 1000 UNITS tablet Take 1,000 Units by mouth daily.      Marland Kitchen ibuprofen (ADVIL,MOTRIN) 200 MG tablet Take 200 mg by mouth every 6 (six) hours as needed.    . Multiple Vitamins-Minerals (PRESERVISION AREDS 2+MULTI VIT) CAPS Take by mouth. Preservision AREDS-2    . omeprazole (PRILOSEC) 40 MG capsule Take 1 capsule (40 mg total) by mouth 2 (two) times daily at 10 AM and 5 PM. 90 capsule 3  . Pyridoxine HCl (VITAMIN B-6) 500 MG tablet Take 500 mg by mouth daily.      . sertraline (ZOLOFT) 50 MG tablet Take 1 tablet by mouth once daily 90 tablet 0  . vitamin B-12 (CYANOCOBALAMIN) 1000 MCG tablet Take 1,000 mcg by mouth daily.      Marland Kitchen diltiazem (CARDIZEM SR) 60 MG 12 hr capsule Take 1 capsule (60 mg total) by mouth 2 (two) times daily. 60  capsule 1  . clonazePAM (KLONOPIN) 0.5 MG tablet  Take 0.5-1 tablets (0.25-0.5 mg total) by mouth 2 (two) times daily as needed for anxiety. This is a 30-day supply. (Patient not taking: Reported on 01/31/2020) 40 tablet 0  . fexofenadine (ALLEGRA) 180 MG tablet Take 180 mg by mouth daily. (Patient not taking: Reported on 01/31/2020)    . Multiple Vitamin (MULTIVITAMIN) tablet Take 1 tablet by mouth daily.   (Patient not taking: Reported on 01/31/2020)    . naproxen sodium (ALEVE) 220 MG tablet Take 220 mg by mouth as needed. (Patient not taking: Reported on 01/31/2020)    . zolpidem (AMBIEN) 5 MG tablet TAKE 1/2 TO 1 (ONE-HALF TO ONE) TABLET BY MOUTH AT BEDTIME AS NEEDED FOR SLEEP (Patient not taking: Reported on 01/31/2020) 20 tablet 0   No facility-administered medications prior to visit.    Allergies  Allergen Reactions  . Doxycycline Other (See Comments)    Blisters on mouth and lips  . Pravachol [Pravastatin]     Fatigued.   . Trazodone And Nefazodone Other (See Comments)    Made her hyper  . Zocor [Simvastatin] Other (See Comments)    Myalgias and memory loss    ROS Review of Systems    Objective:    Physical Exam  BP (!) 171/64 (BP Location: Left Arm, Patient Position: Sitting, Cuff Size: Normal)   Pulse 75   Wt 146 lb 1.3 oz (66.3 kg)   BMI 24.31 kg/m  Wt Readings from Last 3 Encounters:  01/31/20 146 lb 1.3 oz (66.3 kg)  10/10/19 152 lb (68.9 kg)  08/08/19 158 lb (71.7 kg)     There are no preventive care reminders to display for this patient.  There are no preventive care reminders to display for this patient.  Lab Results  Component Value Date   TSH 2.17 01/13/2019   Lab Results  Component Value Date   WBC 9.3 07/25/2016   HGB 13.6 07/25/2016   HCT 40.3 07/25/2016   MCV 89.4 07/25/2016   PLT 241 07/25/2016   Lab Results  Component Value Date   NA 140 07/25/2019   K 4.0 07/25/2019   CO2 31 07/25/2019   GLUCOSE 107 (H) 07/25/2019   BUN 8 07/25/2019    CREATININE 0.64 07/25/2019   BILITOT 0.5 07/25/2019   ALKPHOS 57 07/25/2016   AST 15 07/25/2019   ALT 10 07/25/2019   PROT 6.6 07/25/2019   ALBUMIN 4.2 07/25/2016   CALCIUM 9.6 07/25/2019   ANIONGAP 8 07/25/2016   Lab Results  Component Value Date   CHOL 226 (H) 02/04/2018   Lab Results  Component Value Date   HDL 45 (L) 02/04/2018   Lab Results  Component Value Date   LDLCALC 154 (H) 02/04/2018   Lab Results  Component Value Date   TRIG 147 02/04/2018   Lab Results  Component Value Date   CHOLHDL 5.0 (H) 02/04/2018   No results found for: HGBA1C    Assessment & Plan:   Problem List Items Addressed This Visit      Cardiovascular and Mediastinum   HYPERTENSION, BENIGN    Uncontrolled today. Her blood pressure was a little better in August but still not well controlled. Circled on her medication sheet the diltiazem will need to send in a refill today so she can start that back. Would like to see her back in a couple weeks to make sure that her blood pressure is now well controlled.      Relevant Medications   diltiazem (CARDIZEM SR) 60 MG 12  hr capsule   Other Relevant Orders   CBC   TSH   COMPLETE METABOLIC PANEL WITH GFR   Lipid panel     Other   Reaction, situational, acute, to stress    Also concurrent history of generalized anxiety disorder. Continue sertraline for now. Currently on 50 mg consider increasing the dose to 100 mg especially if the sleep medication is not helping at that point it would be her third medication that she has tried in which case I would strongly suspect that the insomnia is somewhat mood related.      Ocular proptosis    Oddly, seems to only affect the left eye. Discussed that we should recheck her thyroid level last checked about a year ago it looks great at that time. Also follow-up with her eye doctor for further work-up as well. May need additional imaging including MRI but I will leave that up to them.      Insomnia     Still struggling with poor sleep quality I do feel like some of this is aggravated by recent grief reaction. We will discontinue Ambien and give Lunesta a trial. We will start with 2 mg we also discussed strategy and moving her sleep schedule backwards or if she is currently is going to bed and sleeping around 3 AM to move that back to 245 for 2 weeks and then to 30 for 2 weeks and on and on. We also discussed taking the sleeping medication about 30 minutes before bedtime so that has that opportunity to take effect by the time she lays down. If she feels like it is too sedating let me know. Okay to stay in bed for 8 hours she does not have to make herself get up earlier if she is going to bed later. She would really like to get her bedtime adjusted to go to bed around 11 or 1130.      Relevant Medications   eszopiclone (LUNESTA) 2 MG TABS tablet   Other Relevant Orders   CBC   TSH   COMPLETE METABOLIC PANEL WITH GFR   Lipid panel   Grief   Bilateral cold feet    No known history of peripheral vascular disease. Though I do not see any ABIs or Dopplers on file for her recently to completely rule this out. That she has good color of her feet. We will continue to monitor and can address further at next office visit. Did encourage her to hydrate more regularly.       Other Visit Diagnoses    Need for influenza vaccination    -  Primary   Relevant Orders   Flu Vaccine QUAD High Dose(Fluad) (Completed)   Muscle cramp       Relevant Medications   diltiazem (CARDIZEM SR) 60 MG 12 hr capsule      Meds ordered this encounter  Medications  . eszopiclone (LUNESTA) 2 MG TABS tablet    Sig: Take 1 tablet (2 mg total) by mouth at bedtime as needed for sleep. Take immediately before bedtime    Dispense:  30 tablet    Refill:  0  . diltiazem (CARDIZEM SR) 60 MG 12 hr capsule    Sig: Take 1 capsule (60 mg total) by mouth 2 (two) times daily.    Dispense:  60 capsule    Refill:  1    Follow-up:  Return in about 1 week (around 02/07/2020) for Sleep  and swallowing problem.   Time spent 42  minutes in encounter.  Beatrice Lecher, MD

## 2020-02-01 ENCOUNTER — Telehealth: Payer: Self-pay | Admitting: Family Medicine

## 2020-02-01 NOTE — Telephone Encounter (Signed)
Spoke with patient, advised her that a new sleep medication was sent in yesterday along with a refill on her BP medication.  Verified it did go to the correct pharmacy.  She stated that she would call the pharmacy before she went back to pick it up.

## 2020-02-01 NOTE — Telephone Encounter (Signed)
Pt seen Dr. Madilyn Fireman yesterday at 3:30 and the pharmacy will not refill her Ambien because they need the okay to do so- Per Patient. Patient is also needing her BP medicine called in.  [place], [place]

## 2020-02-02 ENCOUNTER — Telehealth: Payer: Self-pay

## 2020-02-02 NOTE — Telephone Encounter (Signed)
PA submitted through cover my meds for Lunesta.

## 2020-02-03 NOTE — Telephone Encounter (Signed)
PA approved.

## 2020-02-07 ENCOUNTER — Encounter: Payer: Self-pay | Admitting: Family Medicine

## 2020-02-07 ENCOUNTER — Ambulatory Visit (INDEPENDENT_AMBULATORY_CARE_PROVIDER_SITE_OTHER): Payer: Medicare HMO | Admitting: Family Medicine

## 2020-02-07 VITALS — BP 127/66 | HR 73 | Ht 65.0 in | Wt 146.0 lb

## 2020-02-07 DIAGNOSIS — H0520 Unspecified exophthalmos: Secondary | ICD-10-CM | POA: Diagnosis not present

## 2020-02-07 DIAGNOSIS — R209 Unspecified disturbances of skin sensation: Secondary | ICD-10-CM

## 2020-02-07 DIAGNOSIS — Z23 Encounter for immunization: Secondary | ICD-10-CM

## 2020-02-07 DIAGNOSIS — F430 Acute stress reaction: Secondary | ICD-10-CM | POA: Diagnosis not present

## 2020-02-07 DIAGNOSIS — F5102 Adjustment insomnia: Secondary | ICD-10-CM | POA: Diagnosis not present

## 2020-02-07 DIAGNOSIS — R69 Illness, unspecified: Secondary | ICD-10-CM | POA: Diagnosis not present

## 2020-02-07 NOTE — Assessment & Plan Note (Signed)
Ahead and move forward with ABIs as she has never had this done before.  She had normal exam and good blood flow on exam when I last saw her.  She is now getting numbness and tingling in her feet.

## 2020-02-07 NOTE — Progress Notes (Signed)
Established Patient Office Visit  Subjective:  Patient ID: Beth [name], female    DOB: 1933/02/07  Age: 86 y.o. MRN: [id number]  CC: No chief complaint on file.   HPI Beth [name] presents for follow-up of several concerns from her visit last week.  Insomnia -she has not started to set a specific bedtime which we can discussed at the last office visit it still very sporadic.  She was unable to get the Lunesta filled because she says that the pharmacy says that it was not authorized.  We did call the pharmacy and verify for some reason they cannot get the authorization to go through even though we did verify that it was authorized on Friday of last week.  F/U ocular proptosis - we had discussed checking TSH. She also said she was scheduling a f/u with her eye doctor but says she does not have an appointment yet.  We did also discuss possibly moving forward with imaging.  Past Medical History:  Diagnosis Date  . Allergy   . Anxiety    situational  . Arthritis   . Asthma   . Cataract   . COPD (chronic obstructive pulmonary disease) (HCC)    PER PT  . Diverticulitis   . Diverticulitis   . Emphysema of lung ([place])   . Esophageal dysmotility   . Fibromyalgia   . Hiatal hernia   . Hyperlipidemia   . Hypertension   . Insomnia   . Osteopenia   . UTI (lower urinary tract infection)     Past Surgical History:  Procedure Laterality Date  . ABDOMINAL HYSTERECTOMY    . BLADDER SURGERY     tac  . COLONOSCOPY     2008    Family History  Problem Relation Age of Onset  . Cancer Mother        breast  . Hyperlipidemia Mother   . Hypertension Mother   . Dementia Mother   . Heart disease Father        MI  . Depression Father   . Hyperlipidemia Father   . Colon cancer Neg Hx   . Esophageal cancer Neg Hx   . Stomach cancer Neg Hx   . Rectal cancer Neg Hx     Social History   Socioeconomic History  . Marital status: Widowed    Spouse name: Not on file  . Number of  children: 2  . Years of education: Not on file  . Highest education level: Not on file  Occupational History  . Occupation: retired  Tobacco Use  . Smoking status: Former Research scientist (life sciences)  . Smokeless tobacco: Never Used  . Tobacco comment: smoker as a teenager  Vaping Use  . Vaping Use: Never used  Substance and Sexual Activity  . Alcohol use: No    Alcohol/week: 0.0 standard drinks    Comment: did buy a bottle of wine this week to help her sleep  . Drug use: No  . Sexual activity: Not on file  Other Topics Concern  . Not on file  Social History Narrative  . Not on file   Social Determinants of Health   Financial Resource Strain:   . Difficulty of Paying Living Expenses: Not on file  Food Insecurity:   . Worried About Charity fundraiser in the Last Year: Not on file  . Ran Out of Food in the Last Year: Not on file  Transportation Needs:   . Lack of Transportation (Medical): Not on file  .  Lack of Transportation (Non-Medical): Not on file  Physical Activity:   . Days of Exercise per Week: Not on file  . Minutes of Exercise per Session: Not on file  Stress:   . Feeling of Stress : Not on file  Social Connections:   . Frequency of Communication with Friends and Family: Not on file  . Frequency of Social Gatherings with Friends and Family: Not on file  . Attends Religious Services: Not on file  . Active Member of Clubs or Organizations: Not on file  . Attends Archivist Meetings: Not on file  . Marital Status: Not on file  Intimate Partner Violence:   . Fear of Current or Ex-Partner: Not on file  . Emotionally Abused: Not on file  . Physically Abused: Not on file  . Sexually Abused: Not on file    Outpatient Medications Prior to Visit  Medication Sig Dispense Refill  . albuterol (VENTOLIN HFA) 108 (90 Base) MCG/ACT inhaler Inhale 2 puffs into the lungs every 6 (six) hours as needed for wheezing or shortness of breath. 18 g prn  . calcium carbonate 1250 MG capsule  Take 1,250 mg by mouth daily.     . Cholecalciferol (VITAMIN D3) 1000 UNITS tablet Take 1,000 Units by mouth daily.      Marland Kitchen diltiazem (CARDIZEM SR) 60 MG 12 hr capsule Take 1 capsule (60 mg total) by mouth 2 (two) times daily. 60 capsule 1  . eszopiclone (LUNESTA) 2 MG TABS tablet Take 1 tablet (2 mg total) by mouth at bedtime as needed for sleep. Take immediately before bedtime 30 tablet 0  . ibuprofen (ADVIL,MOTRIN) 200 MG tablet Take 200 mg by mouth every 6 (six) hours as needed.    . Multiple Vitamins-Minerals (PRESERVISION AREDS 2+MULTI VIT) CAPS Take by mouth. Preservision AREDS-2    . omeprazole (PRILOSEC) 40 MG capsule Take 1 capsule (40 mg total) by mouth 2 (two) times daily at 10 AM and 5 PM. 90 capsule 3  . Pyridoxine HCl (VITAMIN B-6) 500 MG tablet Take 500 mg by mouth daily.      . sertraline (ZOLOFT) 50 MG tablet Take 1 tablet by mouth once daily 90 tablet 0  . vitamin B-12 (CYANOCOBALAMIN) 1000 MCG tablet Take 1,000 mcg by mouth daily.       No facility-administered medications prior to visit.    Allergies  Allergen Reactions  . Doxycycline Other (See Comments)    Blisters on mouth and lips  . Pravachol [Pravastatin]     Fatigued.   . Trazodone And Nefazodone Other (See Comments)    Made her hyper  . Zocor [Simvastatin] Other (See Comments)    Myalgias and memory loss    ROS Review of Systems    Objective:    Physical Exam Constitutional:      Appearance: She is well-developed.  HENT:     Head: Normocephalic and atraumatic.     Comments: Left eye is more prominent.  Cardiovascular:     Rate and Rhythm: Normal rate and regular rhythm.     Heart sounds: Normal heart sounds.  Pulmonary:     Effort: Pulmonary effort is normal.     Breath sounds: Normal breath sounds.  Skin:    General: Skin is warm and dry.  Neurological:     Mental Status: She is alert and oriented to person, place, and time.  Psychiatric:        Behavior: Behavior normal.     BP 127/66  Pulse 73   Ht 5\' 5"  (1.651 m)   Wt 146 lb (66.2 kg)   BMI 24.30 kg/m  Wt Readings from Last 3 Encounters:  02/07/20 146 lb (66.2 kg)  01/31/20 146 lb 1.3 oz (66.3 kg)  10/10/19 152 lb (68.9 kg)     There are no preventive care reminders to display for this patient.  There are no preventive care reminders to display for this patient.  Lab Results  Component Value Date   TSH 2.17 01/13/2019   Lab Results  Component Value Date   WBC 9.3 07/25/2016   HGB 13.6 07/25/2016   HCT 40.3 07/25/2016   MCV 89.4 07/25/2016   PLT 241 07/25/2016   Lab Results  Component Value Date   NA 140 07/25/2019   K 4.0 07/25/2019   CO2 31 07/25/2019   GLUCOSE 107 (H) 07/25/2019   BUN 8 07/25/2019   CREATININE 0.64 07/25/2019   BILITOT 0.5 07/25/2019   ALKPHOS 57 07/25/2016   AST 15 07/25/2019   ALT 10 07/25/2019   PROT 6.6 07/25/2019   ALBUMIN 4.2 07/25/2016   CALCIUM 9.6 07/25/2019   ANIONGAP 8 07/25/2016   Lab Results  Component Value Date   CHOL 226 (H) 02/04/2018   Lab Results  Component Value Date   HDL 45 (L) 02/04/2018   Lab Results  Component Value Date   LDLCALC 154 (H) 02/04/2018   Lab Results  Component Value Date   TRIG 147 02/04/2018   Lab Results  Component Value Date   CHOLHDL 5.0 (H) 02/04/2018   No results found for: HGBA1C    Assessment & Plan:   Problem List Items Addressed This Visit      Other   Reaction, situational, acute, to stress    Also discussed that if she was not improving on the sleep aid that we may need to actually look at adjusting her Zoloft.      Ocular proptosis    Highly encouraged her to follow-up with her eye doctor in the meantime we will go ahead and get an MRI orbits with and without contrast for further work-up also have her go to the lab today to check her thyroid.      Relevant Orders   MR ORBITS W WO CONTRAST   Insomnia    Able to quit call [facility] and get them to run a good Rx coupon for $21.  She will pick up  the Lunesta there and try it.  If she has any problems or side effects please let us know otherwise I had like to see her back in about 3 weeks.      Bilateral cold feet    Ahead and move forward with ABIs as she has never had this done before.  She had normal exam and good blood flow on exam when I last saw her.  She is now getting numbness and tingling in her feet.      Relevant Orders   VAS [place] ABI WITH/WO TBI    Other Visit Diagnoses    Need for immunization against influenza    -  Primary   Relevant Orders   Flu Vaccine QUAD 36+ mos IM (Completed)      No orders of the defined types were placed in this encounter.   Follow-up: Return in about 3 weeks (around 02/28/2020).    Beatrice Lecher, MD

## 2020-02-07 NOTE — Progress Notes (Signed)
She stated that she was unable to p/u the medication for sleep due to cost. She said that it was $800.   Called [facility] and spoke w/Katrina she stated that her insurance was not processing the PA. I told her that this could be done cheaper thru [facility] she ran this thru [facility] and the cost was $21.72. pt advised and is agreeable to this.

## 2020-02-07 NOTE — Assessment & Plan Note (Signed)
Highly encouraged her to follow-up with her eye doctor in the meantime we will go ahead and get an MRI orbits with and without contrast for further work-up also have her go to the lab today to check her thyroid.

## 2020-02-07 NOTE — Assessment & Plan Note (Signed)
Able to quit call [facility] and get them to run a good Rx coupon for $21.  She will pick up the Lunesta there and try it.  If she has any problems or side effects please let us know otherwise I had like to see her back in about 3 weeks.

## 2020-02-07 NOTE — Assessment & Plan Note (Signed)
Also discussed that if she was not improving on the sleep aid that we may need to actually look at adjusting her Zoloft.

## 2020-02-08 ENCOUNTER — Ambulatory Visit: Payer: Medicare HMO | Admitting: Family Medicine

## 2020-02-14 ENCOUNTER — Other Ambulatory Visit: Payer: Self-pay | Admitting: Family Medicine

## 2020-02-14 DIAGNOSIS — F411 Generalized anxiety disorder: Secondary | ICD-10-CM

## 2020-02-24 ENCOUNTER — Ambulatory Visit (HOSPITAL_COMMUNITY)
Admission: RE | Admit: 2020-02-24 | Discharge: 2020-02-24 | Disposition: A | Discharge status: Home / Self Care | Payer: Medicare HMO | Source: Ambulatory Visit | Attending: Internal Medicine | Admitting: Internal Medicine

## 2020-02-24 DIAGNOSIS — R69 Illness, unspecified: Secondary | ICD-10-CM | POA: Diagnosis not present

## 2020-02-24 DIAGNOSIS — R209 Unspecified disturbances of skin sensation: Secondary | ICD-10-CM | POA: Diagnosis not present

## 2020-02-24 DIAGNOSIS — I10 Essential (primary) hypertension: Secondary | ICD-10-CM | POA: Diagnosis not present

## 2020-02-25 LAB — COMPLETE METABOLIC PANEL WITH GFR
AG Ratio: 1.8 (calc) (ref 1.0–2.5)
ALT: 12 U/L (ref 6–29)
AST: 19 U/L (ref 10–35)
Albumin: 4.3 g/dL (ref 3.6–5.1)
Alkaline phosphatase (APISO): 70 U/L (ref 37–153)
BUN/Creatinine Ratio: 15 (calc) (ref 6–22)
BUN: 8 mg/dL (ref 7–25)
CO2: 31 mmol/L (ref 20–32)
Calcium: 9.5 mg/dL (ref 8.6–10.4)
Chloride: 101 mmol/L (ref 98–110)
Creat: 0.53 mg/dL — ABNORMAL LOW (ref 0.60–0.88)
GFR, Est African American: 100 mL/min/{1.73_m2} (ref >=60)
GFR, Est Non African American: 87 mL/min/{1.73_m2} (ref >=60)
Globulin: 2.4 g/dL (calc) (ref 1.9–3.7)
Glucose, Bld: 90 mg/dL (ref 65–99)
Potassium: 4 mmol/L (ref 3.5–5.3)
Sodium: 139 mmol/L (ref 135–146)
Total Bilirubin: 0.4 mg/dL (ref 0.2–1.2)
Total Protein: 6.7 g/dL (ref 6.1–8.1)

## 2020-02-25 LAB — CBC
HCT: 41.3 % (ref 35.0–45.0)
Hemoglobin: 13.5 g/dL (ref 11.7–15.5)
MCH: 29.5 pg (ref 27.0–33.0)
MCHC: 32.7 g/dL (ref 32.0–36.0)
MCV: 90.4 fL (ref 80.0–100.0)
MPV: 12.6 fL — ABNORMAL HIGH (ref 7.5–12.5)
Platelets: 226 10*3/uL (ref 140–400)
RBC: 4.57 10*6/uL (ref 3.80–5.10)
RDW: 13.2 % (ref 11.0–15.0)
WBC: 6.6 10*3/uL (ref 3.8–10.8)

## 2020-02-25 LAB — LIPID PANEL
Cholesterol: 216 mg/dL — ABNORMAL HIGH (ref <200)
HDL: 47 mg/dL — ABNORMAL LOW (ref >=50)
LDL Cholesterol (Calc): 137 mg/dL (calc) — ABNORMAL HIGH
Non-HDL Cholesterol (Calc): 169 mg/dL (calc) — ABNORMAL HIGH (ref <130)
Total CHOL/HDL Ratio: 4.6 (calc) (ref <5.0)
Triglycerides: 180 mg/dL — ABNORMAL HIGH (ref <150)

## 2020-02-25 LAB — TSH: TSH: 1.07 mIU/L (ref 0.40–4.50)

## 2020-02-28 ENCOUNTER — Ambulatory Visit (INDEPENDENT_AMBULATORY_CARE_PROVIDER_SITE_OTHER): Payer: Medicare HMO | Admitting: Family Medicine

## 2020-02-28 ENCOUNTER — Encounter: Payer: Self-pay | Admitting: Family Medicine

## 2020-02-28 VITALS — BP 132/65 | HR 72 | Ht 65.0 in | Wt 145.0 lb

## 2020-02-28 DIAGNOSIS — H0520 Unspecified exophthalmos: Secondary | ICD-10-CM

## 2020-02-28 DIAGNOSIS — F5102 Adjustment insomnia: Secondary | ICD-10-CM | POA: Diagnosis not present

## 2020-02-28 DIAGNOSIS — R69 Illness, unspecified: Secondary | ICD-10-CM | POA: Diagnosis not present

## 2020-02-28 NOTE — Assessment & Plan Note (Signed)
MRI has been approved through April 2022 so encouraged her to just go straight down to stair sure imaging department to go ahead and get an appointment.

## 2020-02-28 NOTE — Assessment & Plan Note (Signed)
Reminded her again about really setting a very specific bed.  Her goal is to go to bed around midnight and then get up around 8 or 9 AM.  So we discussed gradually working that bedtime backwards.  Discussed setting her bedtime to 30 and doing that for a week then setting it for 215 doing not for a week and setting it for 2:00 and doing that for a week and again just working backwards by 15-minute increments.  Discussed that my goal for her is to get her off the [facility] as soon as were able to and maybe be able to drop her down to the 1 mg dose.  She can even split the tab if needed periodically

## 2020-02-28 NOTE — Progress Notes (Signed)
Established Patient Office Visit  Subjective:  Patient ID: Beth [name], female    DOB: 1933/02/24  Age: 86 y.o. MRN: [id number]  CC:  Chief Complaint  Patient presents with  . Follow-up    HPI Beth [name] presents for f/u insomnia - she has started the [place]  and is doing well.  Feels like she is resting well. Hasn't had any side effects from medication.  She says she finally just feels relief to actually get some sleep.  We had discussed working on setting a bedtime but she admits she really has not done that she did try not taking the medication a few nights and says it was still about 3 AM before she finally fell asleep.  Does the pill is tiny but sometimes it actually gets stuck in her throat so she swallows it with a little piece of bread.  Did get an approval letter for the MRI for her orbits but says that she got a message from the imaging department but somehow completed it and was not able to call them back she said she tried but could not get a hold of anyone.  Past Medical History:  Diagnosis Date  . Allergy   . Anxiety    situational  . Arthritis   . Asthma   . Cataract   . COPD (chronic obstructive pulmonary disease) (HCC)    PER PT  . Diverticulitis   . Diverticulitis   . Emphysema of lung ([place])   . Esophageal dysmotility   . Fibromyalgia   . Hiatal hernia   . Hyperlipidemia   . Hypertension   . Insomnia   . Osteopenia   . UTI (lower urinary tract infection)     Past Surgical History:  Procedure Laterality Date  . ABDOMINAL HYSTERECTOMY    . BLADDER SURGERY     tac  . COLONOSCOPY     2008    Family History  Problem Relation Age of Onset  . Cancer Mother        breast  . Hyperlipidemia Mother   . Hypertension Mother   . Dementia Mother   . Heart disease Father        MI  . Depression Father   . Hyperlipidemia Father   . Colon cancer Neg Hx   . Esophageal cancer Neg Hx   . Stomach cancer Neg Hx   . Rectal cancer Neg Hx     Social  History   Socioeconomic History  . Marital status: Widowed    Spouse name: Not on file  . Number of children: 2  . Years of education: Not on file  . Highest education level: Not on file  Occupational History  . Occupation: retired  Tobacco Use  . Smoking status: Former Research scientist (life sciences)  . Smokeless tobacco: Never Used  . Tobacco comment: smoker as a teenager  Vaping Use  . Vaping Use: Never used  Substance and Sexual Activity  . Alcohol use: No    Alcohol/week: 0.0 standard drinks    Comment: did buy a bottle of wine this week to help her sleep  . Drug use: No  . Sexual activity: Not on file  Other Topics Concern  . Not on file  Social History Narrative  . Not on file   Social Determinants of Health   Financial Resource Strain:   . Difficulty of Paying Living Expenses: Not on file  Food Insecurity:   . Worried About Charity fundraiser in the  Last Year: Not on file  . Ran Out of Food in the Last Year: Not on file  Transportation Needs:   . Lack of Transportation (Medical): Not on file  . Lack of Transportation (Non-Medical): Not on file  Physical Activity:   . Days of Exercise per Week: Not on file  . Minutes of Exercise per Session: Not on file  Stress:   . Feeling of Stress : Not on file  Social Connections:   . Frequency of Communication with Friends and Family: Not on file  . Frequency of Social Gatherings with Friends and Family: Not on file  . Attends Religious Services: Not on file  . Active Member of Clubs or Organizations: Not on file  . Attends Archivist Meetings: Not on file  . Marital Status: Not on file  Intimate Partner Violence:   . Fear of Current or Ex-Partner: Not on file  . Emotionally Abused: Not on file  . Physically Abused: Not on file  . Sexually Abused: Not on file    Outpatient Medications Prior to Visit  Medication Sig Dispense Refill  . albuterol (VENTOLIN HFA) 108 (90 Base) MCG/ACT inhaler Inhale 2 puffs into the lungs every 6  (six) hours as needed for wheezing or shortness of breath. 18 g prn  . calcium carbonate 1250 MG capsule Take 1,250 mg by mouth daily.     . Cholecalciferol (VITAMIN D3) 1000 UNITS tablet Take 1,000 Units by mouth daily.      Marland Kitchen diltiazem (CARDIZEM SR) 60 MG 12 hr capsule Take 1 capsule (60 mg total) by mouth 2 (two) times daily. 60 capsule 1  . eszopiclone (LUNESTA) 2 MG TABS tablet Take 1 tablet (2 mg total) by mouth at bedtime as needed for sleep. Take immediately before bedtime 30 tablet 0  . ibuprofen (ADVIL,MOTRIN) 200 MG tablet Take 200 mg by mouth every 6 (six) hours as needed.    . Multiple Vitamins-Minerals (PRESERVISION AREDS 2+MULTI VIT) CAPS Take by mouth. Preservision AREDS-2    . omeprazole (PRILOSEC) 40 MG capsule Take 1 capsule (40 mg total) by mouth 2 (two) times daily at 10 AM and 5 PM. 90 capsule 3  . Pyridoxine HCl (VITAMIN B-6) 500 MG tablet Take 500 mg by mouth daily.      . sertraline (ZOLOFT) 50 MG tablet Take 1 tablet by mouth once daily 90 tablet 0  . vitamin B-12 (CYANOCOBALAMIN) 1000 MCG tablet Take 1,000 mcg by mouth daily.       No facility-administered medications prior to visit.    Allergies  Allergen Reactions  . Doxycycline Other (See Comments)    Blisters on mouth and lips  . Pravachol [Pravastatin]     Fatigued.   . Trazodone And Nefazodone Other (See Comments)    Made her hyper  . Zocor [Simvastatin] Other (See Comments)    Myalgias and memory loss    ROS Review of Systems    Objective:    Physical Exam  BP 132/65   Pulse 72   Ht 5\' 5"  (1.651 m)   Wt 145 lb (65.8 kg)   SpO2 100%   BMI 24.13 kg/m  Wt Readings from Last 3 Encounters:  02/28/20 145 lb (65.8 kg)  02/07/20 146 lb (66.2 kg)  01/31/20 146 lb 1.3 oz (66.3 kg)     There are no preventive care reminders to display for this patient.  There are no preventive care reminders to display for this patient.  Lab Results  Component  Value Date   TSH 1.07 02/24/2020   Lab  Results  Component Value Date   WBC 6.6 02/24/2020   HGB 13.5 02/24/2020   HCT 41.3 02/24/2020   MCV 90.4 02/24/2020   PLT 226 02/24/2020   Lab Results  Component Value Date   NA 139 02/24/2020   K 4.0 02/24/2020   CO2 31 02/24/2020   GLUCOSE 90 02/24/2020   BUN 8 02/24/2020   CREATININE 0.53 (L) 02/24/2020   BILITOT 0.4 02/24/2020   ALKPHOS 57 07/25/2016   AST 19 02/24/2020   ALT 12 02/24/2020   PROT 6.7 02/24/2020   ALBUMIN 4.2 07/25/2016   CALCIUM 9.5 02/24/2020   ANIONGAP 8 07/25/2016   Lab Results  Component Value Date   CHOL 216 (H) 02/24/2020   Lab Results  Component Value Date   HDL 47 (L) 02/24/2020   Lab Results  Component Value Date   LDLCALC 137 (H) 02/24/2020   Lab Results  Component Value Date   TRIG 180 (H) 02/24/2020   Lab Results  Component Value Date   CHOLHDL 4.6 02/24/2020   No results found for: HGBA1C    Assessment & Plan:   Problem List Items Addressed This Visit      Other   Ocular proptosis    MRI has been approved through April 2022 so encouraged her to just go straight down to stair sure imaging department to go ahead and get an appointment.      Insomnia - Primary    Reminded her again about really setting a very specific bed.  Her goal is to go to bed around midnight and then get up around 8 or 9 AM.  So we discussed gradually working that bedtime backwards.  Discussed setting her bedtime to 30 and doing that for a week then setting it for 215 doing not for a week and setting it for 2:00 and doing that for a week and again just working backwards by 15-minute increments.  Discussed that my goal for her is to get her off the [facility] as soon as were able to and maybe be able to drop her down to the 1 mg dose.  She can even split the tab if needed periodically         No orders of the defined types were placed in this encounter.   Follow-up: Return in about 3 months (around 05/30/2020) for sleep medicine.    Beatrice Lecher, MD

## 2020-03-05 ENCOUNTER — Other Ambulatory Visit: Payer: Self-pay | Admitting: Family Medicine

## 2020-03-05 ENCOUNTER — Other Ambulatory Visit: Payer: Medicare HMO

## 2020-03-05 DIAGNOSIS — F5102 Adjustment insomnia: Secondary | ICD-10-CM

## 2020-03-12 ENCOUNTER — Ambulatory Visit (INDEPENDENT_AMBULATORY_CARE_PROVIDER_SITE_OTHER): Payer: Medicare HMO

## 2020-03-12 ENCOUNTER — Other Ambulatory Visit: Payer: Self-pay

## 2020-03-12 DIAGNOSIS — H0520 Unspecified exophthalmos: Secondary | ICD-10-CM | POA: Diagnosis not present

## 2020-03-12 DIAGNOSIS — H05242 Constant exophthalmos, left eye: Secondary | ICD-10-CM | POA: Diagnosis not present

## 2020-03-12 MED ORDER — GADOXETATE DISODIUM 0.25 MMOL/ML IV SOLN: 6.5000 mL | Freq: Once | INTRAVENOUS | Status: DC

## 2020-03-12 MED ORDER — GADOBUTROL 1 MMOL/ML IV SOLN
6.5000 mL | Freq: Once | INTRAVENOUS | Status: AC | PRN
  Administered 2020-03-12: 6.5 mL via INTRAVENOUS

## 2020-03-29 DIAGNOSIS — H1045 Other chronic allergic conjunctivitis: Secondary | ICD-10-CM | POA: Diagnosis not present

## 2020-03-29 DIAGNOSIS — H01134 Eczematous dermatitis of left upper eyelid: Secondary | ICD-10-CM | POA: Diagnosis not present

## 2020-03-29 DIAGNOSIS — H01131 Eczematous dermatitis of right upper eyelid: Secondary | ICD-10-CM | POA: Diagnosis not present

## 2020-03-29 DIAGNOSIS — H01132 Eczematous dermatitis of right lower eyelid: Secondary | ICD-10-CM | POA: Diagnosis not present

## 2020-03-29 DIAGNOSIS — H01135 Eczematous dermatitis of left lower eyelid: Secondary | ICD-10-CM | POA: Diagnosis not present

## 2020-04-10 ENCOUNTER — Other Ambulatory Visit: Payer: Self-pay

## 2020-04-10 MED ORDER — ALBUTEROL SULFATE HFA 108 (90 BASE) MCG/ACT IN AERS: 2.0000 | INHALATION_SPRAY | Freq: Four times a day (QID) | RESPIRATORY_TRACT | 99 refills | Status: DC | PRN

## 2020-04-12 DIAGNOSIS — N3000 Acute cystitis without hematuria: Secondary | ICD-10-CM | POA: Diagnosis not present

## 2020-04-12 DIAGNOSIS — N3020 Other chronic cystitis without hematuria: Secondary | ICD-10-CM | POA: Diagnosis not present

## 2020-05-22 ENCOUNTER — Other Ambulatory Visit: Payer: Self-pay | Admitting: Family Medicine

## 2020-05-22 DIAGNOSIS — F411 Generalized anxiety disorder: Secondary | ICD-10-CM

## 2020-05-31 ENCOUNTER — Ambulatory Visit: Payer: Medicare HMO | Admitting: Family Medicine

## 2020-06-20 ENCOUNTER — Encounter: Payer: Self-pay | Admitting: Family Medicine

## 2020-06-20 ENCOUNTER — Telehealth (INDEPENDENT_AMBULATORY_CARE_PROVIDER_SITE_OTHER): Payer: Medicare HMO | Admitting: Family Medicine

## 2020-06-20 DIAGNOSIS — J069 Acute upper respiratory infection, unspecified: Secondary | ICD-10-CM | POA: Diagnosis not present

## 2020-06-20 NOTE — Progress Notes (Signed)
Really bad sore throat x 2 days ago but this is better now. She was taking cough syrup and IBU. She reports that she was feeling achy but not now.  She reports that she did have a headache on the first day that her sxs began. Hasn't been around any sick contacts.    She has a slightly productive cough no color using the vaporizer at night and feels that this has helped her with her sxs.

## 2020-06-20 NOTE — Progress Notes (Signed)
Virtual Visit via Telephone Note  I connected with Beth [name] on 06/20/20 at  9:10 AM EST by telephone and verified that I am speaking with the correct person using two identifiers.   I discussed the limitations, risks, security and privacy concerns of performing an evaluation and management service by telephone and the availability of in person appointments. I also discussed with the patient that there may be a patient responsible charge related to this service. The patient expressed understanding and agreed to proceed.  Patient location: at home Provider loccation: In office   Subjective:    CC: URI sxs     HPI: Really bad sore throat x 2 days ago but this is better now. She was taking cough syrup and IBU. She reports that she was feeling achy but not now.  She reports that she did have a headache on the first day that her sxs began. Hasn't been around any sick contacts. She has a slightly productive cough no color using the vaporizer at night and feels that this has helped her with her sxs   Past medical history, Surgical history, Family history not pertinant except as noted below, Social history, Allergies, and medications have been entered into the medical record, reviewed, and corrections made.   Review of Systems: No fevers, chills, night sweats, weight loss, chest pain, or shortness of breath.   Objective:    General: Speaking clearly in complete sentences without any shortness of breath.  Alert and oriented x3.  Normal judgment. No apparent acute distress.    Impression and Recommendations:    URI -recommend continue symptomatic care right now her symptoms sound mild no worrisome findings such as shortness of breath or fever.  Discussed with her that this could last a week or maybe even a little more.  If after a week she is not improving then please let us know if she feels like she suddenly getting worse let us know.  Did encourage her to consider getting tested for  COVID.      I discussed the assessment and treatment plan with the patient. The patient was provided an opportunity to ask questions and all were answered. The patient agreed with the plan and demonstrated an understanding of the instructions.   The patient was advised to call back or seek an in-person evaluation if the symptoms worsen or if the condition fails to improve as anticipated.  I provided 18 minutes of non-face-to-face time during this encounter.   Beatrice Lecher, MD

## 2020-07-13 ENCOUNTER — Ambulatory Visit (INDEPENDENT_AMBULATORY_CARE_PROVIDER_SITE_OTHER): Payer: Medicare HMO | Admitting: Family Medicine

## 2020-07-13 DIAGNOSIS — Z0000 Encounter for general adult medical examination without abnormal findings: Secondary | ICD-10-CM

## 2020-07-13 NOTE — Progress Notes (Signed)
MEDICARE ANNUAL WELLNESS VISIT  07/13/2020  Telephone Visit Disclaimer This Medicare AWV was conducted by telephone due to national recommendations for restrictions regarding the COVID-19 Pandemic (e.g. social distancing).  I verified, using two identifiers, that I am speaking with Beth [name] or their authorized healthcare agent. I discussed the limitations, risks, security, and privacy concerns of performing an evaluation and management service by telephone and the potential availability of an in-person appointment in the future. The patient expressed understanding and agreed to proceed.  Location of Patient: Home Location of Provider (nurse):  In the office.  Subjective:    Beth [name] is a 87 y.o. female patient of Metheney, Rene Kocher, MD who had a Medicare Annual Wellness Visit today via telephone. Beth [name] is Retired and lives alone. she has 2 children (one has deceased). she reports that she is socially active and does interact with friends/family regularly. she is minimally physically active and enjoys puzzles and word searches.  Patient Care Team: Hali Marry, MD as PCP - General Franchot Gallo, MD as Consulting Physician (Urology) [facility] as Referring Physician  Advanced Directives 07/13/2020 09/28/2016 07/25/2016 08/02/2015 01/16/2014 01/15/2014 09/06/2013  Does Patient Have a Medical Advance Directive? Yes No No Yes Yes No Patient has advance directive, copy not in chart  Type of Advance Directive [place];Living will - - [place] will - -  Does patient want to make changes to medical advance directive? No - Patient declined - - - No - Patient declined - -  Copy of [place] in Chart? No - copy requested - - - - - -  Would patient like information on creating a medical advance directive? - - - - No - patient declined information No - patient declined information -  Pre-existing out  of facility DNR order (yellow form or pink MOST form) - - - - - - -    Hospital Utilization Over the Past 12 Months: # of hospitalizations or ER visits: 0 # of surgeries: 0  Review of Systems    Patient reports that her overall health is unchanged compared to last year.  History obtained from chart review and the patient  Patient Reported Readings (BP, Pulse, CBG, Weight, etc) none  Pain Assessment Pain : No/denies pain     Current Medications & Allergies (verified) Allergies as of 07/13/2020      Reactions   Doxycycline Other (See Comments)   Blisters on mouth and lips   Pravachol [pravastatin]    Fatigued.    Trazodone And Nefazodone Other (See Comments)   Made her hyper   Zocor [simvastatin] Other (See Comments)   Myalgias and memory loss      Medication List       Accurate as of July 13, 2020  1:24 PM. If you have any questions, ask your nurse or doctor.        albuterol 108 (90 Base) MCG/ACT inhaler Commonly known as: VENTOLIN HFA Inhale 2 puffs into the lungs every 6 (six) hours as needed for wheezing or shortness of breath.   calcium carbonate 1250 MG capsule Take 1,250 mg by mouth daily.   cholecalciferol 25 MCG (1000 UNIT) tablet Commonly known as: VITAMIN D Take 1,000 Units by mouth daily.   diltiazem 60 MG 12 hr capsule Commonly known as: CARDIZEM SR Take 1 capsule (60 mg total) by mouth 2 (two) times daily.   ibuprofen 200 MG tablet Commonly known  as: ADVIL Take 200 mg by mouth every 6 (six) hours as needed.   omeprazole 40 MG capsule Commonly known as: PRILOSEC Take 1 capsule (40 mg total) by mouth 2 (two) times daily at 10 AM and 5 PM.   PreserVision AREDS 2+Multi Vit Caps Take by mouth. Preservision AREDS-2   sertraline 50 MG tablet Commonly known as: ZOLOFT Take 1 tablet by mouth once daily   vitamin B-12 1000 MCG tablet Commonly known as: CYANOCOBALAMIN Take 1,000 mcg by mouth daily.   vitamin B-6 500 MG tablet Take 500 mg by  mouth daily.       History (reviewed): Past Medical History:  Diagnosis Date  . Allergy   . Anxiety    situational  . Arthritis   . Asthma   . Cataract   . COPD (chronic obstructive pulmonary disease) (HCC)    PER PT  . Diverticulitis   . Diverticulitis   . Emphysema of lung ([place])   . Esophageal dysmotility   . Fibromyalgia   . Hiatal hernia   . Hyperlipidemia   . Hypertension   . Insomnia   . Osteopenia   . UTI (lower urinary tract infection)    Past Surgical History:  Procedure Laterality Date  . ABDOMINAL HYSTERECTOMY    . BLADDER SURGERY     tac  . COLONOSCOPY     2008   Family History  Problem Relation Age of Onset  . Cancer Mother        breast  . Hyperlipidemia Mother   . Hypertension Mother   . Dementia Mother   . Heart disease Father        MI  . Depression Father   . Hyperlipidemia Father   . Colon cancer Neg Hx   . Esophageal cancer Neg Hx   . Stomach cancer Neg Hx   . Rectal cancer Neg Hx    Social History   Socioeconomic History  . Marital status: Widowed    Spouse name: Not on file  . Number of children: 2  . Years of education: 12th  . Highest education level: High school graduate  Occupational History  . Occupation: retired  Tobacco Use  . Smoking status: Former Research scientist (life sciences)  . Smokeless tobacco: Never Used  . Tobacco comment: smoker as a teenager  Vaping Use  . Vaping Use: Never used  Substance and Sexual Activity  . Alcohol use: No    Alcohol/week: 0.0 standard drinks    Comment: occasionally  . Drug use: No  . Sexual activity: Not on file  Other Topics Concern  . Not on file  Social History Narrative   Lives alone. She lost her son a year ago and had to put her dog to sleep in December. Her daughter lives 30  mins away. Likes to work on puzzles, word searches and read about history in her free time. Likes to do family research.   Social Determinants of Health   Financial Resource Strain: Low Risk   . Difficulty of Paying  Living Expenses: Not hard at all  Food Insecurity: No Food Insecurity  . Worried About Charity fundraiser in the Last Year: Never true  . Ran Out of Food in the Last Year: Never true  Transportation Needs: No Transportation Needs  . Lack of Transportation (Medical): No  . Lack of Transportation (Non-Medical): No  Physical Activity: Inactive  . Days of Exercise per Week: 0 days  . Minutes of Exercise per Session: 0 min  Stress: No Stress Concern Present  . Feeling of Stress : Not at all  Social Connections: Socially Isolated  . Frequency of Communication with Friends and Family: More than three times a week  . Frequency of Social Gatherings with Friends and Family: Twice a week  . Attends Religious Services: Never  . Active Member of Clubs or Organizations: No  . Attends Archivist Meetings: Never  . Marital Status: Widowed    Activities of Daily Living In your present state of health, do you have any difficulty performing the following activities: 07/13/2020 07/19/2019  Hearing? Y N  Comment had hearing aids but doesn't use them. -  Vision? N N  Difficulty concentrating or making decisions? N N  Walking or climbing stairs? N N  Dressing or bathing? N N  Doing errands, shopping? N N  Preparing Food and eating ? N -  Using the Toilet? N -  In the past six months, have you accidently leaked urine? Y -  Comment sometimes, has been using a toileting schedule which seems to help alot -  Do you have problems with loss of bowel control? N -  Managing your Medications? N -  Managing your Finances? N -  Housekeeping or managing your Housekeeping? N -  Some recent data might be hidden    Patient Education/ Literacy How often do you need to have someone help you when you read instructions, pamphlets, or other written materials from your doctor or pharmacy?: 1 - Never What is the last grade level you completed in school?: 12th grade  Exercise Current Exercise Habits: The  patient does not participate in regular exercise at present, Exercise limited by: None identified  Diet Patient reports consuming 3 meals a day and 2 snack(s) a day Patient reports that her primary diet is: Regular Patient reports that she does have regular access to food.   Depression Screen PHQ 2/9 Scores 07/13/2020 10/10/2019 08/08/2019 07/19/2019 01/27/2019 09/16/2018 02/04/2018  PHQ - 2 Score 0 0 - 1 0 0 0  PHQ- 9 Score 0 - - 2 - - -  Exception Documentation - - Other- indicate reason in comment box - - - -  Not completed - - pt unable to adequately answer the depression screening at this time - - - -     Fall Risk Fall Risk  07/13/2020 10/10/2019 08/08/2019 02/04/2018 02/04/2017  Falls in the past year? 0 - 0 No Yes  Number falls in past yr: 0 - 0 - 1  Injury with Fall? 0 - 0 - Yes  Risk for fall due to : No Fall Risks No Fall Risks No Fall Risks - Other (Comment)  Follow up Falls evaluation completed - - - Falls prevention discussed     Objective:  Beth [name] seemed alert and oriented and she participated appropriately during our telephone visit.  Blood Pressure Weight BMI  BP Readings from Last 3 Encounters:  02/28/20 132/65  02/07/20 127/66  01/31/20 (!) 171/64   Wt Readings from Last 3 Encounters:  02/28/20 145 lb (65.8 kg)  02/07/20 146 lb (66.2 kg)  01/31/20 146 lb 1.3 oz (66.3 kg)   BMI Readings from Last 1 Encounters:  02/28/20 24.13 kg/m    *Unable to obtain current vital signs, weight, and BMI due to telephone visit type  Hearing/Vision  . Nela did not seem to have difficulty with hearing/understanding during the telephone conversation . Reports that she has had a formal eye exam by an  eye care professional within the past year . Reports that she has not had a formal hearing evaluation within the past year *Unable to fully assess hearing and vision during telephone visit type  Cognitive Function: 6CIT Screen 07/13/2020  What Year? 0 points  What month? 0  points  What time? 0 points  Count back from 20 0 points  Months in reverse 0 points  Repeat phrase 0 points  Total Score 0   (Normal:0-7, Significant for Dysfunction: >8)  Normal Cognitive Function Screening: Yes   Immunization & Health Maintenance Record Immunization History  Administered Date(s) Administered  . Fluad Quad(high Dose 65+) 01/27/2019, 01/31/2020  . Influenza Split 02/18/2012  . Influenza Whole 05/04/2009  . Influenza, High Dose Seasonal PF 02/04/2017, 02/04/2018  . Influenza,inj,Quad PF,6+ Mos 02/25/2013, 05/30/2015, 12/27/2015  . Influenza-Unspecified 02/21/2014  . PFIZER(Purple Top)SARS-COV-2 Vaccination 06/24/2019, 07/20/2019, 02/18/2020  . Pneumococcal Conjugate-13 07/17/2016  . Pneumococcal Polysaccharide-23 07/03/2004  . Tdap 02/18/2011  . Zoster 02/21/2013    Health Maintenance  Topic Date Due  . Samul Dada  02/17/2021  . INFLUENZA VACCINE  Completed  . DEXA SCAN  Completed  . COVID-19 Vaccine  Completed  . PNA vac Low Risk Adult  Completed  . HPV VACCINES  Aged Out       Assessment  This is a routine wellness examination for Beth [name].  Health Maintenance: Due or Overdue There are no preventive care reminders to display for this patient.  Beth [name] does not need a referral for Community Assistance: Care Management:   no Social Work:    no Prescription Assistance:  no Nutrition/Diabetes Education:  no   Plan:  Personalized Goals Goals Addressed              This Visit's Progress   .  Patient Stated (pt-stated)        07/13/2020 AWV Goal: Exercise for General Health   Patient will verbalize understanding of the benefits of increased physical activity:  Exercising regularly is important. It will improve your overall fitness, flexibility, and endurance.  Regular exercise also will improve your overall health. It can help you control your weight, reduce stress, and improve your bone density.  Over the next year,  patient will increase physical activity as tolerated with a goal of at least 150 minutes of moderate physical activity per week.   You can tell that you are exercising at a moderate intensity if your heart starts beating faster and you start breathing faster but can still hold a conversation.  Moderate-intensity exercise ideas include:  Walking 1 mile (1.6 km) in about 15 minutes  Biking  Hiking  Golfing  Dancing  Water aerobics  Patient will verbalize understanding of everyday activities that increase physical activity by providing examples like the following: ? Yard work, such as: ? Pushing a Conservation officer, nature ? Raking and bagging leaves ? Washing your car ? Pushing a stroller ? Shoveling snow ? Gardening ? Washing windows or floors  Patient will be able to explain general safety guidelines for exercising:   Before you start a new exercise program, talk with your health care provider.  Do not exercise so much that you hurt yourself, feel dizzy, or get very short of breath.  Wear comfortable clothes and wear shoes with good support.  Drink plenty of water while you exercise to prevent dehydration or heat stroke.  Work out until your breathing and your heartbeat get faster.       Personalized Health Maintenance &  Screening Recommendations  Bone densitometry screening - discussed the dexa scan with the patient but she did not want a referral at this time.  Shingrix vaccine - patient stated that she did not want that vaccine at this time but if she does change her mind, patient will let us know. Tdap- Due in October, 2022.  Lung Cancer Screening Recommended: no (Low Dose CT Chest recommended if Age 65-80 years, 30 pack-year currently smoking OR have quit w/in past 15 years) Hepatitis C Screening recommended: no HIV Screening recommended: no  Advanced Directives: Written information was not prepared per patient's request.  Referrals & Orders No orders of the defined types  were placed in this encounter.   Follow-up Plan . Follow-up with Hali Marry, MD as planned . Medicare wellness in one year. . Let us know if you change your mind about the Dexa scan/ Bone density scan. . Tdap due in October, 2022   I have personally reviewed and noted the following in the patient's chart:   . Medical and social history . Use of alcohol, tobacco or illicit drugs  . Current medications and supplements . Functional ability and status . Nutritional status . Physical activity . Advanced directives . List of other physicians . Hospitalizations, surgeries, and ER visits in previous 12 months . Vitals . Screenings to include cognitive, depression, and falls . Referrals and appointments  In addition, I have reviewed and discussed with Beth [name] certain preventive protocols, quality metrics, and best practice recommendations. A written personalized care plan for preventive services as well as general preventive health recommendations is available and can be mailed to the patient at her request.      Tinnie Gens, RN  07/13/2020

## 2020-07-13 NOTE — Patient Instructions (Signed)
[place] Maintenance Summary and Written Plan of Care  Ms. Beth [name] ,  Thank you for allowing me to perform your Medicare Annual Wellness Visit and for your ongoing commitment to your health.   Health Maintenance & Immunization History Health Maintenance  Topic Date Due  . COVID-19 Vaccine (3 - Booster for Pfizer series) 01/20/2020  . TETANUS/TDAP  02/17/2021  . INFLUENZA VACCINE  Completed  . DEXA SCAN  Completed  . PNA vac Low Risk Adult  Completed  . HPV VACCINES  Aged Out   Immunization History  Administered Date(s) Administered  . Fluad Quad(high Dose 65+) 01/27/2019, 01/31/2020  . Influenza Split 02/18/2012  . Influenza Whole 05/04/2009  . Influenza, High Dose Seasonal PF 02/04/2017, 02/04/2018  . Influenza,inj,Quad PF,6+ Mos 02/25/2013, 05/30/2015, 12/27/2015  . Influenza-Unspecified 02/21/2014  . PFIZER(Purple Top)SARS-COV-2 Vaccination 06/24/2019, 07/20/2019  . Pneumococcal Conjugate-13 07/17/2016  . Pneumococcal Polysaccharide-23 07/03/2004  . Tdap 02/18/2011  . Zoster 02/21/2013    These are the patient goals that we discussed: Goals Addressed              This Visit's Progress   .  Patient Stated (pt-stated)        07/13/2020 AWV Goal: Exercise for General Health   Patient will verbalize understanding of the benefits of increased physical activity:  Exercising regularly is important. It will improve your overall fitness, flexibility, and endurance.  Regular exercise also will improve your overall health. It can help you control your weight, reduce stress, and improve your bone density.  Over the next year, patient will increase physical activity as tolerated with a goal of at least 150 minutes of moderate physical activity per week.   You can tell that you are exercising at a moderate intensity if your heart starts beating faster and you start breathing faster but can still hold a conversation.  Moderate-intensity exercise  ideas include:  Walking 1 mile (1.6 km) in about 15 minutes  Biking  Hiking  Golfing  Dancing  Water aerobics  Patient will verbalize understanding of everyday activities that increase physical activity by providing examples like the following: ? Yard work, such as: ? Pushing a Conservation officer, nature ? Raking and bagging leaves ? Washing your car ? Pushing a stroller ? Shoveling snow ? Gardening ? Washing windows or floors  Patient will be able to explain general safety guidelines for exercising:   Before you start a new exercise program, talk with your health care provider.  Do not exercise so much that you hurt yourself, feel dizzy, or get very short of breath.  Wear comfortable clothes and wear shoes with good support.  Drink plenty of water while you exercise to prevent dehydration or heat stroke.  Work out until your breathing and your heartbeat get faster.         This is a list of Health Maintenance Items that are overdue or due now: Health Maintenance Due  Topic Date Due  . COVID-19 Vaccine (3 - Booster for Pfizer series) 01/20/2020     Orders/Referrals Placed Today: No orders of the defined types were placed in this encounter.  (Contact our referral department at [phone] if you have not spoken with someone about your referral appointment within the next 5 days)    Follow-up Plan . Follow-up with Hali Marry, MD as planned . Medicare wellness in one year. . Let us know if you change your mind about the Dexa scan/ Bone density scan. . TDap due  in October, 2022.       Bone Density Test A bone density test uses a type of X-ray to measure the amount of calcium and other minerals in a person's bones. It can measure bone density in the hip and the spine. The test is similar to having a regular X-ray. This test may also be called:  Bone densitometry.  Bone mineral density test.  Dual-energy X-ray absorptiometry (DEXA). You may have this test  to:  Diagnose a condition that causes weak or thin bones (osteoporosis).  Screen you for osteoporosis.  Predict your risk for a broken bone (fracture).  Determine how well your osteoporosis treatment is working. Tell a health care provider about:  Any allergies you have.  All medicines you are taking, including vitamins, herbs, eye drops, creams, and over-the-counter medicines.  Any problems you or family members have had with anesthetic medicines.  Any blood disorders you have.  Any surgeries you have had.  Any medical conditions you have.  Whether you are pregnant or may be pregnant.  Any medical tests you have had within the past 14 days that used contrast material. What are the risks? Generally, this is a safe test. However, it does expose you to a small amount of radiation, which can slightly increase your cancer risk. What happens before the test?  Do not take any calcium supplements within the 24 hours before your test.  You will need to remove all metal jewelry, eyeglasses, removable dental appliances, and any other metal objects on your body. What happens during the test?  You will lie down on an exam table. There will be an X-ray generator below you and an imaging device above you.  Other devices, such as boxes or braces, may be used to position your body properly for the scan.  The machine will slowly scan your body. You will need to keep very still while the machine does the scan.  The images will show up on a screen in the room. Images will be examined by a specialist after your test is finished. The procedure may vary among health care providers and hospitals.   What can I expect after the test? It is up to you to get the results of your test. Ask your health care provider, or the department that is doing the test, when your results will be ready. Summary  A bone density test is an imaging test that uses a type of X-ray to measure the amount of calcium and  other minerals in your bones.  The test may be used to diagnose or screen you for a condition that causes weak or thin bones (osteoporosis), predict your risk for a broken bone (fracture), or determine how well your osteoporosis treatment is working.  Do not take any calcium supplements within 24 hours before your test.  Ask your health care provider, or the department that is doing the test, when your results will be ready. This information is not intended to replace advice given to you by your health care provider. Make sure you discuss any questions you have with your health care provider. Document Revised: 09/29/2019 Document Reviewed: 09/29/2019 Elsevier Patient Education  [place] Maintenance, Female Adopting a healthy lifestyle and getting preventive care are important in promoting health and wellness. Ask your health care provider about:  The right schedule for you to have regular tests and exams.  Things you can do on your own to prevent diseases and keep yourself healthy.  What should I know about diet, weight, and exercise? Eat a healthy diet  Eat a diet that includes plenty of vegetables, fruits, low-fat dairy products, and lean protein.  Do not eat a lot of foods that are high in solid fats, added sugars, or sodium.   Maintain a healthy weight Body mass index (BMI) is used to identify weight problems. It estimates body fat based on height and weight. Your health care provider can help determine your BMI and help you achieve or maintain a healthy weight. Get regular exercise Get regular exercise. This is one of the most important things you can do for your health. Most adults should:  Exercise for at least 150 minutes each week. The exercise should increase your heart rate and make you sweat (moderate-intensity exercise).  Do strengthening exercises at least twice a week. This is in addition to the moderate-intensity exercise.  Spend less time sitting.  Even light physical activity can be beneficial. Watch cholesterol and blood lipids Have your blood tested for lipids and cholesterol at 85 years of age, then have this test every 5 years. Have your cholesterol levels checked more often if:  Your lipid or cholesterol levels are high.  You are older than 85 years of age.  You are at high risk for heart disease. What should I know about cancer screening? Depending on your health history and family history, you may need to have cancer screening at various ages. This may include screening for:  Breast cancer.  Cervical cancer.  Colorectal cancer.  Skin cancer.  Lung cancer. What should I know about heart disease, diabetes, and high blood pressure? Blood pressure and heart disease  High blood pressure causes heart disease and increases the risk of stroke. This is more likely to develop in people who have high blood pressure readings, are of African descent, or are overweight.  Have your blood pressure checked: ? Every 3-5 years if you are 25-62 years of age. ? Every year if you are 16 years old or older. Diabetes Have regular diabetes screenings. This checks your fasting blood sugar level. Have the screening done:  Once every three years after age 23 if you are at a normal weight and have a low risk for diabetes.  More often and at a younger age if you are overweight or have a high risk for diabetes. What should I know about preventing infection? Hepatitis B If you have a higher risk for hepatitis B, you should be screened for this virus. Talk with your health care provider to find out if you are at risk for hepatitis B infection. Hepatitis C Testing is recommended for:  Everyone born from [birth year] through 1963.  Anyone with known risk factors for hepatitis C. Sexually transmitted infections (STIs)  Get screened for STIs, including gonorrhea and chlamydia, if: ? You are sexually active and are younger than 85 years of  age. ? You are older than 85 years of age and your health care provider tells you that you are at risk for this type of infection. ? Your sexual activity has changed since you were last screened, and you are at increased risk for chlamydia or gonorrhea. Ask your health care provider if you are at risk.  Ask your health care provider about whether you are at high risk for HIV. Your health care provider may recommend a prescription medicine to help prevent HIV infection. If you choose to take medicine to prevent HIV, you should first get tested for HIV.  You should then be tested every 3 months for as long as you are taking the medicine. Pregnancy  If you are about to stop having your period (premenopausal) and you may become pregnant, seek counseling before you get pregnant.  Take 400 to 800 micrograms (mcg) of folic acid every day if you become pregnant.  Ask for birth control (contraception) if you want to prevent pregnancy. Osteoporosis and menopause Osteoporosis is a disease in which the bones lose minerals and strength with aging. This can result in bone fractures. If you are 74 years old or older, or if you are at risk for osteoporosis and fractures, ask your health care provider if you should:  Be screened for bone loss.  Take a calcium or vitamin D supplement to lower your risk of fractures.  Be given hormone replacement therapy (HRT) to treat symptoms of menopause. Follow these instructions at home: Lifestyle  Do not use any products that contain nicotine or tobacco, such as cigarettes, e-cigarettes, and chewing tobacco. If you need help quitting, ask your health care provider.  Do not use street drugs.  Do not share needles.  Ask your health care provider for help if you need support or information about quitting drugs. Alcohol use  Do not drink alcohol if: ? Your health care provider tells you not to drink. ? You are pregnant, may be pregnant, or are planning to become  pregnant.  If you drink alcohol: ? Limit how much you use to 0-1 drink a day. ? Limit intake if you are breastfeeding.  Be aware of how much alcohol is in your drink. In the [place] one drink equals one 12 oz bottle of beer (355 mL), one 5 oz glass of wine (148 mL), or one 1 oz glass of hard liquor (44 mL). General instructions  Schedule regular health, dental, and eye exams.  Stay current with your vaccines.  Tell your health care provider if: ? You often feel depressed. ? You have ever been abused or do not feel safe at home. Summary  Adopting a healthy lifestyle and getting preventive care are important in promoting health and wellness.  Follow your health care provider's instructions about healthy diet, exercising, and getting tested or screened for diseases.  Follow your health care provider's instructions on monitoring your cholesterol and blood pressure. This information is not intended to replace advice given to you by your health care provider. Make sure you discuss any questions you have with your health care provider. Document Revised: 04/07/2018 Document Reviewed: 04/07/2018 Elsevier Patient Education  2021 [employer].   .

## 2020-07-19 ENCOUNTER — Telehealth (INDEPENDENT_AMBULATORY_CARE_PROVIDER_SITE_OTHER): Payer: Medicare HMO | Admitting: Family Medicine

## 2020-07-19 ENCOUNTER — Encounter: Payer: Self-pay | Admitting: Family Medicine

## 2020-07-19 DIAGNOSIS — R69 Illness, unspecified: Secondary | ICD-10-CM | POA: Diagnosis not present

## 2020-07-19 DIAGNOSIS — F5102 Adjustment insomnia: Secondary | ICD-10-CM

## 2020-07-19 MED ORDER — CLONAZEPAM 0.5 MG PO TABS: 0.2500 mg | ORAL_TABLET | Freq: Every evening | ORAL | 1 refills | Status: DC | PRN

## 2020-07-19 NOTE — Progress Notes (Signed)
Virtual Visit via Telephone Note  I connected with Beth [name] on 07/19/20 at 11:30 AM EDT by telephone and verified that I am speaking with the correct person using two identifiers.   I discussed the limitations, risks, security and privacy concerns of performing an evaluation and management service by telephone and the availability of in person appointments. I also discussed with the patient that there may be a patient responsible charge related to this service. The patient expressed understanding and agreed to proceed.  [facility] location:[facility]  Provider loccation: In office     Established Patient Office Visit  Subjective:  Patient ID: Beth [name], female    DOB: 1933/04/28  Age: 87 y.o. MRN: [id number]  CC:  Chief Complaint  Patient presents with  . Insomnia    HPI Beth [name] presents for chronic insomnia. She is just exhausted.   Pt stated that she cannot take Lunesta. She said that she can't think straight. She hasn't been able to sleep. She will doze off for about 20 mins and then will wake up and stay up. She tried settng a bedtime at 2AM and then gradually moving forward and it didn't help.   She said that she told Dr. Madilyn Fireman that she could not take this medication in the past. The last refill for this medication  was given was Lunesta 2mg  on 03/05/2020. Pt reports that it has been 2 months since she took this and had 6 pills left over.    The last refill for Ambien 5mg  was 11/22/2019 #20 with 1 refill. Up until then pt had been on this since 09/06/2013.   She was given Doxepin 3 mg on 10/10/2019. This wasn't effective.   We also previously tried trazodone as well.  Past Medical History:  Diagnosis Date  . Allergy   . Anxiety    situational  . Arthritis   . Asthma   . Cataract   . COPD (chronic obstructive pulmonary disease) (HCC)    PER PT  . Diverticulitis   . Diverticulitis   . Emphysema of lung ([facility])   . Esophageal dysmotility   . Fibromyalgia    . Hiatal hernia   . Hyperlipidemia   . Hypertension   . Insomnia   . Osteopenia   . UTI (lower urinary tract infection)     Past Surgical History:  Procedure Laterality Date  . ABDOMINAL HYSTERECTOMY    . BLADDER SURGERY     tac  . COLONOSCOPY     2008    Family History  Problem Relation Age of Onset  . Cancer Mother        breast  . Hyperlipidemia Mother   . Hypertension Mother   . Dementia Mother   . Heart disease Father        MI  . Depression Father   . Hyperlipidemia Father   . Colon cancer Neg Hx   . Esophageal cancer Neg Hx   . Stomach cancer Neg Hx   . Rectal cancer Neg Hx     Social History   Socioeconomic History  . Marital status: Widowed    Spouse name: Not on file  . Number of children: 2  . Years of education: 12th  . Highest education level: High school graduate  Occupational History  . Occupation: retired  Tobacco Use  . Smoking status: Former Research scientist (life sciences)  . Smokeless tobacco: Never Used  . Tobacco comment: smoker as a teenager  Vaping Use  . Vaping Use:  Never used  Substance and Sexual Activity  . Alcohol use: No    Alcohol/week: 0.0 standard drinks    Comment: occasionally  . Drug use: No  . Sexual activity: Not on file  Other Topics Concern  . Not on file  Social History Narrative   Lives alone. She lost her son a year ago and had to put her dog to sleep in December. Her daughter lives 30  mins away. Likes to work on puzzles, word searches and read about history in her free time. Likes to do family research.   Social Determinants of Health   Financial Resource Strain: Low Risk   . Difficulty of Paying Living Expenses: Not hard at all  Food Insecurity: No Food Insecurity  . Worried About Charity fundraiser in the Last Year: Never true  . Ran Out of Food in the Last Year: Never true  Transportation Needs: No Transportation Needs  . Lack of Transportation (Medical): No  . Lack of Transportation (Non-Medical): No  Physical Activity:  Inactive  . Days of Exercise per Week: 0 days  . Minutes of Exercise per Session: 0 min  Stress: No Stress Concern Present  . Feeling of Stress : Not at all  Social Connections: Socially Isolated  . Frequency of Communication with Friends and Family: More than three times a week  . Frequency of Social Gatherings with Friends and Family: Twice a week  . Attends Religious Services: Never  . Active Member of Clubs or Organizations: No  . Attends Archivist Meetings: Never  . Marital Status: Widowed  Intimate Partner Violence: Not At Risk  . Fear of Current or Ex-Partner: No  . Emotionally Abused: No  . Physically Abused: No  . Sexually Abused: No    Outpatient Medications Prior to Visit  Medication Sig Dispense Refill  . albuterol (VENTOLIN HFA) 108 (90 Base) MCG/ACT inhaler Inhale 2 puffs into the lungs every 6 (six) hours as needed for wheezing or shortness of breath. 18 g prn  . calcium carbonate 1250 MG capsule Take 1,250 mg by mouth daily.    . Cholecalciferol (VITAMIN D3) 1000 UNITS tablet Take 1,000 Units by mouth daily.    Marland Kitchen diltiazem (CARDIZEM SR) 60 MG 12 hr capsule Take 1 capsule (60 mg total) by mouth 2 (two) times daily. 60 capsule 1  . ibuprofen (ADVIL,MOTRIN) 200 MG tablet Take 200 mg by mouth every 6 (six) hours as needed.    . Multiple Vitamins-Minerals (PRESERVISION AREDS 2+MULTI VIT) CAPS Take by mouth. Preservision AREDS-2    . omeprazole (PRILOSEC) 40 MG capsule Take 1 capsule (40 mg total) by mouth 2 (two) times daily at 10 AM and 5 PM. 90 capsule 3  . Pyridoxine HCl (VITAMIN B-6) 500 MG tablet Take 500 mg by mouth daily.    . sertraline (ZOLOFT) 50 MG tablet Take 1 tablet by mouth once daily 90 tablet 0  . vitamin B-12 (CYANOCOBALAMIN) 1000 MCG tablet Take 1,000 mcg by mouth daily.     Facility-Administered Medications Prior to Visit  Medication Dose Route Frequency Provider Last Rate Last Admin  . gadoxetate disodium (EOVIST) injection 6.5 mL  6.5 mL  Intravenous Once Hali Marry, MD        Allergies  Allergen Reactions  . Doxycycline Other (See Comments)    Blisters on mouth and lips  . Lunesta [Eszopiclone] Other (See Comments)    Confused mentally  . Pravachol [Pravastatin]     Fatigued.   Marland Kitchen  Trazodone And Nefazodone Other (See Comments)    Made her hyper  . Zocor [Simvastatin] Other (See Comments)    Myalgias and memory loss    ROS Review of Systems    Objective:    Physical Exam  Pulse 77   Temp 97.7 F (36.5 C)   Ht 5\' 5"  (1.651 m)   SpO2 96%   BMI 24.13 kg/m  Wt Readings from Last 3 Encounters:  02/28/20 145 lb (65.8 kg)  02/07/20 146 lb (66.2 kg)  01/31/20 146 lb 1.3 oz (66.3 kg)     There are no preventive care reminders to display for this patient.  There are no preventive care reminders to display for this patient.  Lab Results  Component Value Date   TSH 1.07 02/24/2020   Lab Results  Component Value Date   WBC 6.6 02/24/2020   HGB 13.5 02/24/2020   HCT 41.3 02/24/2020   MCV 90.4 02/24/2020   PLT 226 02/24/2020   Lab Results  Component Value Date   NA 139 02/24/2020   K 4.0 02/24/2020   CO2 31 02/24/2020   GLUCOSE 90 02/24/2020   BUN 8 02/24/2020   CREATININE 0.53 (L) 02/24/2020   BILITOT 0.4 02/24/2020   ALKPHOS 57 07/25/2016   AST 19 02/24/2020   ALT 12 02/24/2020   PROT 6.7 02/24/2020   ALBUMIN 4.2 07/25/2016   CALCIUM 9.5 02/24/2020   ANIONGAP 8 07/25/2016   Lab Results  Component Value Date   CHOL 216 (H) 02/24/2020   Lab Results  Component Value Date   HDL 47 (L) 02/24/2020   Lab Results  Component Value Date   LDLCALC 137 (H) 02/24/2020   Lab Results  Component Value Date   TRIG 180 (H) 02/24/2020   Lab Results  Component Value Date   CHOLHDL 4.6 02/24/2020   No results found for: HGBA1C    Assessment & Plan:   Problem List Items Addressed This Visit      Other   Insomnia    Chronic insomnia she is already tried Ambien 5 mg and did not  have any significant per improvement, doxepin 3 mg with no significant improvement, trazodone, Lunesta because mental fogginess.  She was on Tranxene years ago and says it works really well but I discussed with her that I would not prescribe this.  She was also on clonazepam last year and we had really worked very hard to wean her off of benzos completely.  We discussed putting her back on clonazepam more short-term until we can get her in with a sleep specialist for consultation.  I do think some of this is stress related but I also think she just has underlying chronic insomnia and the death of her son earlier this year has really triggered some worsening of symptoms.  Result we have also gone over sleep hygiene multiple times and even tried moving her bedtime back to about 2 AM and then gradually moving forward which she says has not helped.  Though it does not sound like she is stuck to it very consistently and it also sounds like she tried to move the time forward way too quickly.      Relevant Medications   clonazePAM (KLONOPIN) 0.5 MG tablet   Other Relevant Orders   Ambulatory referral to Neurology      Meds ordered this encounter  Medications  . clonazePAM (KLONOPIN) 0.5 MG tablet    Sig: Take 0.5-1 tablets (0.25-0.5 mg total) by mouth at  bedtime as needed for anxiety. This is a 30-day supply.    Dispense:  30 tablet    Refill:  1    Follow-up: No follow-ups on file.      I discussed the assessment and treatment plan with the patient. The patient was provided an opportunity to ask questions and all were answered. The patient agreed with the plan and demonstrated an understanding of the instructions.   The patient was advised to call back or seek an in-person evaluation if the symptoms worsen or if the condition fails to improve as anticipated.  I provided 21 minutes of non-face-to-face time during this encounter.   Beatrice Lecher, MD  Beatrice Lecher, MD

## 2020-07-19 NOTE — Assessment & Plan Note (Addendum)
Chronic insomnia she is already tried Ambien 5 mg and did not have any significant per improvement, doxepin 3 mg with no significant improvement, trazodone, Lunesta because mental fogginess.  She was on Tranxene years ago and says it works really well but I discussed with her that I would not prescribe this.  She was also on clonazepam last year and we had really worked very hard to wean her off of benzos completely.  We discussed putting her back on clonazepam more short-term until we can get her in with a sleep specialist for consultation.  I do think some of this is stress related but I also think she just has underlying chronic insomnia and the death of her son earlier this year has really triggered some worsening of symptoms.  Result we have also gone over sleep hygiene multiple times and even tried moving her bedtime back to about 2 AM and then gradually moving forward which she says has not helped.  Though it does not sound like she is stuck to it very consistently and it also sounds like she tried to move the time forward way too quickly.

## 2020-07-19 NOTE — Progress Notes (Signed)
Pt stated that she cannot take Lunesta. She said that she can't think straight. She hasn't been able to sleep. She will doze off for about 20 mins and then will wake up and stay up.   She said that she told Dr. Madilyn Fireman that she could not take this medication in the past. The last refill for this medication  was given was Lunesta 2mg  on 03/05/2020. Pt reports that it has been 2 months since she took this and had 6 pills left over.    The last refill for Ambien 5mg  was 11/22/2019 #20 with 1 refill. Up until then pt had been on this since 09/06/2013. She was given Doxepin 3 mg on 10/10/2019.

## 2020-07-23 ENCOUNTER — Other Ambulatory Visit: Payer: Self-pay | Admitting: Family Medicine

## 2020-07-23 DIAGNOSIS — R252 Cramp and spasm: Secondary | ICD-10-CM

## 2020-08-30 ENCOUNTER — Other Ambulatory Visit: Payer: Self-pay | Admitting: Family Medicine

## 2020-08-30 DIAGNOSIS — F411 Generalized anxiety disorder: Secondary | ICD-10-CM

## 2020-09-11 ENCOUNTER — Institutional Professional Consult (permissible substitution): Payer: Medicare HMO | Admitting: Neurology

## 2020-09-21 DIAGNOSIS — N3020 Other chronic cystitis without hematuria: Secondary | ICD-10-CM | POA: Diagnosis not present

## 2020-09-25 ENCOUNTER — Ambulatory Visit (INDEPENDENT_AMBULATORY_CARE_PROVIDER_SITE_OTHER): Payer: Medicare HMO

## 2020-09-25 ENCOUNTER — Ambulatory Visit (INDEPENDENT_AMBULATORY_CARE_PROVIDER_SITE_OTHER): Payer: Medicare HMO | Admitting: Family Medicine

## 2020-09-25 ENCOUNTER — Other Ambulatory Visit: Payer: Self-pay

## 2020-09-25 ENCOUNTER — Encounter: Payer: Self-pay | Admitting: Family Medicine

## 2020-09-25 VITALS — BP 128/69 | HR 64 | Ht 64.17 in | Wt 147.0 lb

## 2020-09-25 DIAGNOSIS — I10 Essential (primary) hypertension: Secondary | ICD-10-CM | POA: Diagnosis not present

## 2020-09-25 DIAGNOSIS — I517 Cardiomegaly: Secondary | ICD-10-CM | POA: Diagnosis not present

## 2020-09-25 DIAGNOSIS — R0602 Shortness of breath: Secondary | ICD-10-CM

## 2020-09-25 DIAGNOSIS — R195 Other fecal abnormalities: Secondary | ICD-10-CM | POA: Diagnosis not present

## 2020-09-25 DIAGNOSIS — R0789 Other chest pain: Secondary | ICD-10-CM | POA: Diagnosis not present

## 2020-09-25 DIAGNOSIS — R9431 Abnormal electrocardiogram [ECG] [EKG]: Secondary | ICD-10-CM

## 2020-09-25 MED ORDER — PANTOPRAZOLE SODIUM 40 MG PO TBEC: 40.0000 mg | DELAYED_RELEASE_TABLET | Freq: Every day | ORAL | 0 refills | Status: DC

## 2020-09-25 NOTE — Assessment & Plan Note (Signed)
Well controlled 

## 2020-09-25 NOTE — Progress Notes (Signed)
Acute Office Visit  Subjective:    Patient ID: Beth [name], female    DOB: 1932/07/29, 87 y.o.   MRN: [id number]  Chief Complaint  Patient presents with  . Chest Pain    HPI Patient is in today for 2 weeks of chest pain when she is getting ready for bed or in the bed.  She says it happened maybe 3 or 4 times.  The last time was about a week ago.  Says she has been using her nebulizer for a mild cough, and that seems to help the chest discomfort as well so she is not sure if it is more related to her breathing or to reflux..  Last episode was about a week ago.  The chest pain lasts for maybe 15-20 min.  She says she also gets relief when she takes Pepto-Bismol as well as an antidiarrheal.  He has been experiencing more heartburn than usual.  She used to take omeprazole regularly until about a year ago.  Her neuropathy is bothering her well. She has to soak her feet and put an electric blanket on her feet.  Feels like it has been worse in the last month.  She has  had arterial dopplers, though some dec flow in left great toe. On diltiazem.    He says her feet bother her at least 4-5 nights per week.  History of HTN and hyperlipidemia    She has had indigestion and loose dark stools, on and off, not new. .    Past Medical History:  Diagnosis Date  . Allergy   . Anxiety    situational  . Arthritis   . Asthma   . Cataract   . COPD (chronic obstructive pulmonary disease) (HCC)    PER PT  . Diverticulitis   . Diverticulitis   . Emphysema of lung ([place])   . Esophageal dysmotility   . Fibromyalgia   . Hiatal hernia   . Hyperlipidemia   . Hypertension   . Insomnia   . Osteopenia   . UTI (lower urinary tract infection)     Past Surgical History:  Procedure Laterality Date  . ABDOMINAL HYSTERECTOMY    . BLADDER SURGERY     tac  . COLONOSCOPY     2008    Family History  Problem Relation Age of Onset  . Cancer Mother        breast  . Hyperlipidemia Mother   .  Hypertension Mother   . Dementia Mother   . Heart disease Father        MI  . Depression Father   . Hyperlipidemia Father   . Colon cancer Neg Hx   . Esophageal cancer Neg Hx   . Stomach cancer Neg Hx   . Rectal cancer Neg Hx     Social History   Socioeconomic History  . Marital status: Widowed    Spouse name: Not on file  . Number of children: 2  . Years of education: 12th  . Highest education level: High school graduate  Occupational History  . Occupation: retired  Tobacco Use  . Smoking status: Former Research scientist (life sciences)  . Smokeless tobacco: Never Used  . Tobacco comment: smoker as a teenager  Vaping Use  . Vaping Use: Never used  Substance and Sexual Activity  . Alcohol use: No    Alcohol/week: 0.0 standard drinks    Comment: occasionally  . Drug use: No  . Sexual activity: Not on file  Other Topics Concern  .  Not on file  Social History Narrative   Lives alone. She lost her son a year ago and had to put her dog to sleep in December. Her daughter lives 30  mins away. Likes to work on puzzles, word searches and read about history in her free time. Likes to do family research.   Social Determinants of Health   Financial Resource Strain: Low Risk   . Difficulty of Paying Living Expenses: Not hard at all  Food Insecurity: No Food Insecurity  . Worried About Charity fundraiser in the Last Year: Never true  . Ran Out of Food in the Last Year: Never true  Transportation Needs: No Transportation Needs  . Lack of Transportation (Medical): No  . Lack of Transportation (Non-Medical): No  Physical Activity: Inactive  . Days of Exercise per Week: 0 days  . Minutes of Exercise per Session: 0 min  Stress: No Stress Concern Present  . Feeling of Stress : Not at all  Social Connections: Socially Isolated  . Frequency of Communication with Friends and Family: More than three times a week  . Frequency of Social Gatherings with Friends and Family: Twice a week  . Attends Religious  Services: Never  . Active Member of Clubs or Organizations: No  . Attends Archivist Meetings: Never  . Marital Status: Widowed  Intimate Partner Violence: Not At Risk  . Fear of Current or Ex-Partner: No  . Emotionally Abused: No  . Physically Abused: No  . Sexually Abused: No    Outpatient Medications Prior to Visit  Medication Sig Dispense Refill  . albuterol (VENTOLIN HFA) 108 (90 Base) MCG/ACT inhaler Inhale 2 puffs into the lungs every 6 (six) hours as needed for wheezing or shortness of breath. 18 g prn  . calcium carbonate 1250 MG capsule Take 1,250 mg by mouth daily.    . Cholecalciferol (VITAMIN D3) 1000 UNITS tablet Take 1,000 Units by mouth daily.    Marland Kitchen diltiazem (CARDIZEM SR) 60 MG 12 hr capsule Take 1 capsule by mouth twice daily 60 capsule 5  . ibuprofen (ADVIL,MOTRIN) 200 MG tablet Take 200 mg by mouth every 6 (six) hours as needed.    . Multiple Vitamins-Minerals (PRESERVISION AREDS 2+MULTI VIT) CAPS Take by mouth. Preservision AREDS-2    . Pyridoxine HCl (VITAMIN B-6) 500 MG tablet Take 500 mg by mouth daily.    . sertraline (ZOLOFT) 50 MG tablet Take 1 tablet by mouth once daily 90 tablet 0  . clonazePAM (KLONOPIN) 0.5 MG tablet Take 0.5-1 tablets (0.25-0.5 mg total) by mouth at bedtime as needed for anxiety. This is a 30-day supply. 30 tablet 1  . omeprazole (PRILOSEC) 40 MG capsule Take 1 capsule (40 mg total) by mouth 2 (two) times daily at 10 AM and 5 PM. 90 capsule 3  . vitamin B-12 (CYANOCOBALAMIN) 1000 MCG tablet Take 1,000 mcg by mouth daily.     Facility-Administered Medications Prior to Visit  Medication Dose Route Frequency Provider Last Rate Last Admin  . gadoxetate disodium (EOVIST) injection 6.5 mL  6.5 mL Intravenous Once Hali Marry, MD        Allergies  Allergen Reactions  . Doxycycline Other (See Comments)    Blisters on mouth and lips  . Lunesta [Eszopiclone] Other (See Comments)    Confused mentally  . Pravachol  [Pravastatin]     Fatigued.   . Trazodone And Nefazodone Other (See Comments)    Made her hyper  . Zocor [Simvastatin] Other (  See Comments)    Myalgias and memory loss    Review of Systems     Objective:    Physical Exam Constitutional:      Appearance: She is well-developed.  HENT:     Head: Normocephalic and atraumatic.  Cardiovascular:     Rate and Rhythm: Normal rate and regular rhythm.     Heart sounds: Normal heart sounds.  Pulmonary:     Effort: Pulmonary effort is normal.     Breath sounds: Normal breath sounds.  Skin:    General: Skin is warm and dry.  Neurological:     Mental Status: She is alert and oriented to person, place, and time.  Psychiatric:        Behavior: Behavior normal.     BP 128/69   Pulse 64   Ht 5' 4.17" (1.63 m)   Wt 147 lb (66.7 kg)   SpO2 98%   BMI 25.10 kg/m  Wt Readings from Last 3 Encounters:  09/25/20 147 lb (66.7 kg)  02/28/20 145 lb (65.8 kg)  02/07/20 146 lb (66.2 kg)    Health Maintenance Due  Topic Date Due  . Zoster Vaccines- Shingrix (1 of 2) Never done    There are no preventive care reminders to display for this patient.   Lab Results  Component Value Date   TSH 1.07 02/24/2020   Lab Results  Component Value Date   WBC 6.6 02/24/2020   HGB 13.5 02/24/2020   HCT 41.3 02/24/2020   MCV 90.4 02/24/2020   PLT 226 02/24/2020   Lab Results  Component Value Date   NA 139 02/24/2020   K 4.0 02/24/2020   CO2 31 02/24/2020   GLUCOSE 90 02/24/2020   BUN 8 02/24/2020   CREATININE 0.53 (L) 02/24/2020   BILITOT 0.4 02/24/2020   ALKPHOS 57 07/25/2016   AST 19 02/24/2020   ALT 12 02/24/2020   PROT 6.7 02/24/2020   ALBUMIN 4.2 07/25/2016   CALCIUM 9.5 02/24/2020   ANIONGAP 8 07/25/2016   Lab Results  Component Value Date   CHOL 216 (H) 02/24/2020   Lab Results  Component Value Date   HDL 47 (L) 02/24/2020   Lab Results  Component Value Date   LDLCALC 137 (H) 02/24/2020   Lab Results  Component  Value Date   TRIG 180 (H) 02/24/2020   Lab Results  Component Value Date   CHOLHDL 4.6 02/24/2020   No results found for: HGBA1C     Assessment & Plan:   Problem List Items Addressed This Visit      Cardiovascular and Mediastinum   HYPERTENSION, BENIGN    Well controlled.       Relevant Orders   CBC with Differential/Platelet   COMPLETE METABOLIC PANEL WITH GFR   TSH   Cardiomegaly   Relevant Orders   CBC with Differential/Platelet   COMPLETE METABOLIC PANEL WITH GFR   TSH    Other Visit Diagnoses    Atypical chest pain    -  Primary   Relevant Orders   EKG 12-Lead   CBC with Differential/Platelet   COMPLETE METABOLIC PANEL WITH GFR   TSH   Ambulatory referral to Cardiology   Dark stools       Relevant Orders   CBC with Differential/Platelet   COMPLETE METABOLIC PANEL WITH GFR   TSH   SOB (shortness of breath)       Relevant Orders   DG Chest 2 View   EKG abnormalities  Relevant Orders   Ambulatory referral to Cardiology     Atypical chest pain-unclear etiology.  She has not a bump up in cough and has been using her nebulizer which seems to help the chest pain.  We will start by getting plain chest film.  No abnormal findings on chest exam today.  She is also been having more reflux than usual as which certainly could be the culprit.  Get a put her back on a PPI for the next month to see if her symptoms improve.  Also at the age of 55 with a history of hyperlipidemia hypertension and also concerned about cardiovascular disease.  We will get some updated labs today as well as EKG.  EKG shows normal sinus rhythm 60 bpm, no acute ST-T wave changes.  She does have a change in lead III compared to prior EKG.   We will go ahead and place referral to cardiology for consultation for atypical chest pain along with EKG changes in lead III.  Meds ordered this encounter  Medications  . pantoprazole (PROTONIX) 40 MG tablet    Sig: Take 1 tablet (40 mg total) by mouth  daily.    Dispense:  30 tablet    Refill:  0     Beatrice Lecher, MD

## 2020-09-26 LAB — CBC WITH DIFFERENTIAL/PLATELET
Absolute Monocytes: 540 cells/uL (ref 200–950)
Basophils Absolute: 72 cells/uL (ref 0–200)
Basophils Relative: 1.2 %
Eosinophils Absolute: 210 cells/uL (ref 15–500)
Eosinophils Relative: 3.5 %
HCT: 41.7 % (ref 35.0–45.0)
Hemoglobin: 13.7 g/dL (ref 11.7–15.5)
Lymphs Abs: 1362 cells/uL (ref 850–3900)
MCH: 30.6 pg (ref 27.0–33.0)
MCHC: 32.9 g/dL (ref 32.0–36.0)
MCV: 93.1 fL (ref 80.0–100.0)
MPV: 12.8 fL — ABNORMAL HIGH (ref 7.5–12.5)
Monocytes Relative: 9 %
Neutro Abs: 3816 cells/uL (ref 1500–7800)
Neutrophils Relative %: 63.6 %
Platelets: 215 10*3/uL (ref 140–400)
RBC: 4.48 10*6/uL (ref 3.80–5.10)
RDW: 13.1 % (ref 11.0–15.0)
Total Lymphocyte: 22.7 %
WBC: 6 10*3/uL (ref 3.8–10.8)

## 2020-09-26 LAB — COMPLETE METABOLIC PANEL WITH GFR
AG Ratio: 1.6 (calc) (ref 1.0–2.5)
ALT: 10 U/L (ref 6–29)
AST: 14 U/L (ref 10–35)
Albumin: 4.4 g/dL (ref 3.6–5.1)
Alkaline phosphatase (APISO): 64 U/L (ref 37–153)
BUN: 16 mg/dL (ref 7–25)
CO2: 30 mmol/L (ref 20–32)
Calcium: 10.3 mg/dL (ref 8.6–10.4)
Chloride: 100 mmol/L (ref 98–110)
Creat: 0.74 mg/dL (ref 0.60–0.88)
GFR, Est African American: 85 mL/min/{1.73_m2} (ref >=60)
GFR, Est Non African American: 73 mL/min/{1.73_m2} (ref >=60)
Globulin: 2.7 g/dL (calc) (ref 1.9–3.7)
Glucose, Bld: 85 mg/dL (ref 65–99)
Potassium: 4.4 mmol/L (ref 3.5–5.3)
Sodium: 137 mmol/L (ref 135–146)
Total Bilirubin: 0.4 mg/dL (ref 0.2–1.2)
Total Protein: 7.1 g/dL (ref 6.1–8.1)

## 2020-09-26 LAB — TSH: TSH: 3.59 mIU/L (ref 0.40–4.50)

## 2020-10-11 ENCOUNTER — Ambulatory Visit: Payer: Medicare HMO | Admitting: Neurology

## 2020-10-11 ENCOUNTER — Encounter: Payer: Self-pay | Admitting: Neurology

## 2020-10-11 VITALS — BP 169/80 | HR 70 | Ht 64.0 in | Wt 151.0 lb

## 2020-10-11 DIAGNOSIS — J449 Chronic obstructive pulmonary disease, unspecified: Secondary | ICD-10-CM

## 2020-10-11 DIAGNOSIS — F5102 Adjustment insomnia: Secondary | ICD-10-CM | POA: Diagnosis not present

## 2020-10-11 DIAGNOSIS — G478 Other sleep disorders: Secondary | ICD-10-CM | POA: Diagnosis not present

## 2020-10-11 DIAGNOSIS — R0683 Snoring: Secondary | ICD-10-CM

## 2020-10-11 DIAGNOSIS — R69 Illness, unspecified: Secondary | ICD-10-CM | POA: Diagnosis not present

## 2020-10-11 MED ORDER — TRAZODONE HCL 50 MG PO TABS
50.0000 mg | ORAL_TABLET | Freq: Every evening | ORAL | 1 refills | Status: DC | PRN
Start: 2020-10-11 — End: 2021-10-18

## 2020-10-11 NOTE — Patient Instructions (Signed)
Please remember to try to maintain good sleep hygiene, which means: Keep a regular sleep and wake schedule, try not to exercise or have a meal within 2 hours of your bedtime, try to keep your bedroom conducive for sleep, that is, cool and dark, without light distractors such as an illuminated alarm clock, and refrain from watching TV right before sleep or in the middle of the night and do not keep the TV or radio on during the night. Also, try not to use or play on electronic devices at bedtime, such as your cell phone, tablet PC or laptop. If you like to read at bedtime on an electronic device, try to dim the background light as much as possible. Do not eat in the middle of the night.   We will request a sleep study.    We will look for leg twitching and snoring or sleep apnea.   For chronic insomnia, you are best followed by a psychiatrist and/or sleep psychologist.   We will call you with the sleep study results and make a follow up appointment if needed.     Insomnia Insomnia is a sleep disorder that makes it difficult to fall asleep or stay asleep. Insomnia can cause fatigue, low energy, difficulty concentrating, moodswings, and poor performance at work or school. There are three different ways to classify insomnia: Difficulty falling asleep. Difficulty staying asleep. Waking up too early in the morning. Any type of insomnia can be long-term (chronic) or short-term (acute). Both are common. Short-term insomnia usually lasts for three months or less. Chronic insomnia occurs at least three times a week for longer than threemonths. What are the causes? Insomnia may be caused by another condition, situation, or substance, such as: Anxiety. Certain medicines. Gastroesophageal reflux disease (GERD) or other gastrointestinal conditions. Asthma or other breathing conditions. Restless legs syndrome, sleep apnea, or other sleep disorders. Chronic pain. Menopause. Stroke. Abuse of alcohol,  tobacco, or illegal drugs. Mental health conditions, such as depression. Caffeine. Neurological disorders, such as Alzheimer's disease. An overactive thyroid (hyperthyroidism). Sometimes, the cause of insomnia may not be known. What increases the risk? Risk factors for insomnia include: Gender. Women are affected more often than men. Age. Insomnia is more common as you get older. Stress. Lack of exercise. Irregular work schedule or working night shifts. Traveling between different time zones. Certain medical and mental health conditions. What are the signs or symptoms? If you have insomnia, the main symptom is having trouble falling asleep or having trouble staying asleep. This may lead to other symptoms, such as: Feeling fatigued or having low energy. Feeling nervous about going to sleep. Not feeling rested in the morning. Having trouble concentrating. Feeling irritable, anxious, or depressed. How is this diagnosed? This condition may be diagnosed based on: Your symptoms and medical history. Your health care provider may ask about: Your sleep habits. Any medical conditions you have. Your mental health. A physical exam. How is this treated? Treatment for insomnia depends on the cause. Treatment may focus on treating an underlying condition that is causing insomnia. Treatment may also include: Medicines to help you sleep. Counseling or therapy. Lifestyle adjustments to help you sleep better. Follow these instructions at home: Eating and drinking  Limit or avoid alcohol, caffeinated beverages, and cigarettes, especially close to bedtime. These can disrupt your sleep. Do not eat a large meal or eat spicy foods right before bedtime. This can lead to digestive discomfort that can make it hard for you to sleep.  Sleep  habits  Keep a sleep diary to help you and your health care provider figure out what could be causing your insomnia. Write down: When you sleep. When you wake up  during the night. How well you sleep. How rested you feel the next day. Any side effects of medicines you are taking. What you eat and drink. Make your bedroom a dark, comfortable place where it is easy to fall asleep. Put up shades or blackout curtains to block light from outside. Use a white noise machine to block noise. Keep the temperature cool. Limit screen use before bedtime. This includes: Watching TV. Using your smartphone, tablet, or computer. Stick to a routine that includes going to bed and waking up at the same times every day and night. This can help you fall asleep faster. Consider making a quiet activity, such as reading, part of your nighttime routine. Try to avoid taking naps during the day so that you sleep better at night. Get out of bed if you are still awake after 15 minutes of trying to sleep. Keep the lights down, but try reading or doing a quiet activity. When you feel sleepy, go back to bed.  General instructions Take over-the-counter and prescription medicines only as told by your health care provider. Exercise regularly, as told by your health care provider. Avoid exercise starting several hours before bedtime. Use relaxation techniques to manage stress. Ask your health care provider to suggest some techniques that may work well for you. These may include: Breathing exercises. Routines to release muscle tension. Visualizing peaceful scenes. Make sure that you drive carefully. Avoid driving if you feel very sleepy. Keep all follow-up visits as told by your health care provider. This is important. Contact a health care provider if: You are tired throughout the day. You have trouble in your daily routine due to sleepiness. You continue to have sleep problems, or your sleep problems get worse. Get help right away if: You have serious thoughts about hurting yourself or someone else. If you ever feel like you may hurt yourself or others, or have thoughts about  taking your own life, get help right away. You can go to your nearest emergency department or call: Your local emergency services (911 in the [place] A suicide crisis helpline, such as the [facility] at 206-699-7984. This is open 24 hours a day. Summary Insomnia is a sleep disorder that makes it difficult to fall asleep or stay asleep. Insomnia can be long-term (chronic) or short-term (acute). Treatment for insomnia depends on the cause. Treatment may focus on treating an underlying condition that is causing insomnia. Keep a sleep diary to help you and your health care provider figure out what could be causing your insomnia. This information is not intended to replace advice given to you by your health care provider. Make sure you discuss any questions you have with your healthcare provider. Document Revised: 02/23/2020 Document Reviewed: 02/23/2020 Elsevier Patient Education  2022 [employer].

## 2020-10-11 NOTE — Progress Notes (Signed)
SLEEP MEDICINE CLINIC    Provider:  Larey Seat, MD  Primary Care Physician:  Hali Marry, [place] [address]     Referring Provider: Hali Marry, [address] [place],  [place] [address]          Chief Complaint according to patient   Patient presents with:     New Patient (Initial Visit)     Presents today for concerns with insomnia. Unable to tolerate lunesta, doxepin and trazodone and ambien. Never had a SS. She states that she doesn't fall asleep until 4/5 am and sleep until 8 am. She may get up and fall back asleep until 11.       HISTORY OF PRESENT ILLNESS:  Beth [name] is a 87 y.o. Caucasian female patient seen here as a referral on 10/11/2020 from her PCP,  for a insomnia evaluation. .  Chief concern according to patient :  " I lost my son in February 2021, after a surgery he took pneumonia , I am now living alone. I don't worry much aside from being lonely". " I may stay on the computer to much, but I love to do genealogy research" .    ANIQUA BRIERE  has a past medical history of Allergy non seasonal, Cataract, COPD (chronic obstructive pulmonary disease) ([place]), Diverticulitis, Emphysema of lung ([place]), Esophageal dysmotility, Fibromyalgia, Hiatal hernia, Hyperlipidemia, Hypertension, Insomnia, Osteopenia, and UTI (lower urinary tract infection). She lost appetite,  Sleep relevant medical history: Nocturia 0-2, frequent UTI,    Family medical /sleep history: No other family member on CPAP with OSA, insomnia, sleep walkers.    Social history:  Patient is retired from Hydrologist, Lobbyist -and lives in a household alone. Family status is widowed, with 2 children, one living child-  grandchildren.  Tobacco use: 60 some years ago.  ETOH use none, Caffeine intake in form of Coffee( in AM some days) Soda( 2 a day) Tea ( none ) or energy drinks. Regular exercise in form of  garden work.  Hobbies :reading , genealogy.     Sleep habits are as follows: The patient's dinner time is between 4-9 PM. The patient goes to bed at 1-4 AM and continues to sleep in intervals of 1 hour, wakes for unknown reasons, not usually due to bathroom breaks, the first time at 1 AM.   The preferred sleep position is right sided, with the support of 1 pillow.  Dreams are reportedly frequent/vivid.  Variable rise time. The patient wakes up spontaneously. She reports not feeling refreshed or restored in AM, due to frequent  interruption-  with symptoms such as head pressure.  Naps are not taken    Review of Systems: Out of a complete 14 system review, the patient complains of only the following symptoms, and all other reviewed systems are negative.:  Fatigue, sleepiness , snoring, fragmented sleep, Insomnia - lack of routines, computer time at night, caffeine in daytime.    How likely are you to doze in the following situations: 0 = not likely, 1 = slight chance, 2 = moderate chance, 3 = high chance   Sitting and Reading? Watching Television? Sitting inactive in a public place (theater or meeting)? As a passenger in a car for an hour without a break? Lying down in the afternoon when circumstances permit? Sitting and talking to someone? Sitting quietly after lunch without alcohol? In a  car, while stopped for a few minutes in traffic?   Total = 2/ 24 points   FSS endorsed at 15/ 63 points.   Social History   Socioeconomic History   Marital status: Widowed    Spouse name: Not on file   Number of children: 2   Years of education: 12th   Highest education level: High school graduate  Occupational History   Occupation: retired  Tobacco Use   Smoking status: Former    Pack years: 0.00   Smokeless tobacco: Never   Tobacco comments:    smoker as a teenager  Scientific laboratory technician Use: Never used  Substance and Sexual Activity   Alcohol use: No    Alcohol/week: 0.0 standard  drinks    Comment: occasionally   Drug use: No   Sexual activity: Not on file  Other Topics Concern   Not on file  Social History Narrative   Lives alone. She lost her son a year ago and had to put her dog to sleep in December. Her daughter lives 30  mins away. Likes to work on puzzles, word searches and read about history in her free time. Likes to do family research.   Social Determinants of Health   Financial Resource Strain: Low Risk    Difficulty of Paying Living Expenses: Not hard at all  Food Insecurity: No Food Insecurity   Worried About Charity fundraiser in the Last Year: Never true   [place] in the Last Year: Never true  Transportation Needs: No Transportation Needs   Lack of Transportation (Medical): No   Lack of Transportation (Non-Medical): No  Physical Activity: Inactive   Days of Exercise per Week: 0 days   Minutes of Exercise per Session: 0 min  Stress: No Stress Concern Present   Feeling of Stress : Not at all  Social Connections: Socially Isolated   Frequency of Communication with Friends and Family: More than three times a week   Frequency of Social Gatherings with Friends and Family: Twice a week   Attends Religious Services: Never   Marine scientist or Organizations: No   Attends Archivist Meetings: Never   Marital Status: Widowed    Family History  Problem Relation Age of Onset   Cancer Mother        breast   Hyperlipidemia Mother    Hypertension Mother    Dementia Mother    Heart disease Father        MI   Depression Father    Hyperlipidemia Father    Colon cancer Neg Hx    Esophageal cancer Neg Hx    Stomach cancer Neg Hx    Rectal cancer Neg Hx     Past Medical History:  Diagnosis Date   Allergy    Anxiety    situational   Arthritis    Asthma    Cataract    COPD (chronic obstructive pulmonary disease) ([place])    PER PT   Diverticulitis    Diverticulitis    Emphysema of lung ([place])    Esophageal dysmotility     Fibromyalgia    Hiatal hernia    Hyperlipidemia    Hypertension    Insomnia    Osteopenia    UTI (lower urinary tract infection)     Past Surgical History:  Procedure Laterality Date   ABDOMINAL HYSTERECTOMY     BLADDER SURGERY     tac   COLONOSCOPY  2008     Current Outpatient Medications on File Prior to Visit  Medication Sig Dispense Refill   albuterol (VENTOLIN HFA) 108 (90 Base) MCG/ACT inhaler Inhale 2 puffs into the lungs every 6 (six) hours as needed for wheezing or shortness of breath. 18 g prn   calcium carbonate 1250 MG capsule Take 1,250 mg by mouth daily.     Cholecalciferol (VITAMIN D3) 1000 UNITS tablet Take 1,000 Units by mouth daily.     diltiazem (CARDIZEM SR) 60 MG 12 hr capsule Take 1 capsule by mouth twice daily 60 capsule 5   ibuprofen (ADVIL,MOTRIN) 200 MG tablet Take 200 mg by mouth every 6 (six) hours as needed.     Multiple Vitamins-Minerals (PRESERVISION AREDS 2+MULTI VIT) CAPS Take by mouth. Preservision AREDS-2     pantoprazole (PROTONIX) 40 MG tablet Take 1 tablet (40 mg total) by mouth daily. 30 tablet 0   Pyridoxine HCl (VITAMIN B-6) 500 MG tablet Take 500 mg by mouth daily.     sertraline (ZOLOFT) 50 MG tablet Take 1 tablet by mouth once daily 90 tablet 0   [DISCONTINUED] omeprazole (PRILOSEC) 40 MG capsule Take 1 capsule (40 mg total) by mouth 2 (two) times daily at 10 AM and 5 PM. 90 capsule 3   Current Facility-Administered Medications on File Prior to Visit  Medication Dose Route Frequency Provider Last Rate Last Admin   gadoxetate disodium (EOVIST) injection 6.5 mL  6.5 mL Intravenous Once Hali Marry, MD        Allergies  Allergen Reactions   Doxycycline Other (See Comments)    Blisters on mouth and lips   Lunesta [Eszopiclone] Other (See Comments)    Confused mentally   Pravachol [Pravastatin]     Fatigued.    Trazodone And Nefazodone Other (See Comments)    Made her hyper   Zocor [Simvastatin] Other (See Comments)     Myalgias and memory loss    Physical exam:  Today's Vitals   10/11/20 1259  BP: (!) 169/80  Pulse: 70  Weight: 151 lb (68.5 kg)  Height: 5\' 4"  (1.626 m)   Body mass index is 25.92 kg/m.   Wt Readings from Last 3 Encounters:  10/11/20 151 lb (68.5 kg)  09/25/20 147 lb (66.7 kg)  02/28/20 145 lb (65.8 kg)     Ht Readings from Last 3 Encounters:  10/11/20 5\' 4"  (1.626 m)  09/25/20 5' 4.17" (1.63 m)  07/19/20 5\' 5"  (1.651 m)      General: The patient is awake, alert and appears not in acute distress. The patient is well groomed. Head: Normocephalic, atraumatic. Neck is supple. Mallampati 2,  neck circumference:  14 inches . Nasal airflow  patent.  Retrognathia is not  seen.  Dental status:  biological teeth.  Cardiovascular:  Regular rate and cardiac rhythm by pulse,  without distended neck veins. Respiratory: Lungs are clear to auscultation.  Skin:  Without evidence of ankle edema, or rash. Trunk: The patient's posture is erect.   Neurologic exam : The patient is awake and alert, oriented to place and time.   Memory subjective described as intact.  Attention span & concentration ability appears normal.  Speech is fluent,  without  dysarthria, but some dysphonia .   Cranial nerves: no loss of smell or taste reported  Pupils are equal and briskly reactive to light. Funduscopic exam .  Extraocular movements in vertical and horizontal planes were intact and without nystagmus. No Diplopia. Visual fields by finger perimetry  are intact. Hearing was intact to soft voice and finger rubbing.    Facial sensation intact to fine touch.  Facial motor strength is symmetric and tongue and uvula move midline.  Neck ROM : rotation, tilt and flexion extension were normal for age and shoulder shrug was symmetrical.    Motor exam:  Symmetric bulk, tone and ROM.   Normal tone without cog wheeling, symmetric grip strength .   Sensory:  Fine touch,ibration were tested  and  normal.  Toes were unable to feel vibration-  Proprioception tested in the upper extremities was normal.   Coordination: Rapid alternating movements in the fingers/hands were of normal speed.  The Finger-to-nose maneuver was intact without evidence of ataxia, dysmetria or tremor.   Gait and station: Patient could rise unassisted from a seated position, walked without assistive device.  Stance is of normal width/ base and the patient turned with  steps.  Toe and heel walk were deferred.  Deep tendon reflexes: in the  upper and lower extremities are symmetric and intact.  Babinski response was deferred.        After spending a total time of 45 minutes face to face and additional time for physical and neurologic examination, review of laboratory studies,  personal review of imaging studies, reports and results of other testing and review of referral information / records as far as provided in visit, I have established the following assessments:  1)  insomnia due to deteriorated routines and changes in life style. No RLS.      My Plan is to proceed with:  1) HST for dry mouth, snoring, morning head pressure.  2) routines to be introduced- [place]>Insomnia . 3) set a rise time, 7 AM, and get into day light. 45 minutes before bed time avoid screen time.   I would like to thank Hali Marry, MD and Hali Marry, [address] [place],  [place] [address] for allowing me to meet with and to take care of this pleasant patient.   I plan to follow up either personally or through our NP within 3 month.   CC: I will share my notes with PCP .  Electronically signed by: Larey Seat, MD 10/11/2020 1:25 PM  [facility] Board certified by The [employer] of Sleep Medicine and Diplomate of the [employer] of Sleep Medicine. Board certified In Neurology through the [place], Fellow of the [employer] of  Neurology. Medical Director of [facility].

## 2020-10-18 ENCOUNTER — Encounter: Payer: Self-pay | Admitting: Family Medicine

## 2020-10-18 ENCOUNTER — Telehealth (INDEPENDENT_AMBULATORY_CARE_PROVIDER_SITE_OTHER): Payer: Medicare HMO | Admitting: Family Medicine

## 2020-10-18 DIAGNOSIS — R809 Proteinuria, unspecified: Secondary | ICD-10-CM

## 2020-10-18 DIAGNOSIS — J069 Acute upper respiratory infection, unspecified: Secondary | ICD-10-CM | POA: Diagnosis not present

## 2020-10-18 DIAGNOSIS — N3001 Acute cystitis with hematuria: Secondary | ICD-10-CM | POA: Diagnosis not present

## 2020-10-18 DIAGNOSIS — Z20822 Contact with and (suspected) exposure to covid-19: Secondary | ICD-10-CM

## 2020-10-18 DIAGNOSIS — R300 Dysuria: Secondary | ICD-10-CM

## 2020-10-18 LAB — POCT URINALYSIS DIP (CLINITEK)
Bilirubin, UA: NEGATIVE
Glucose, UA: NEGATIVE mg/dL
Ketones, POC UA: NEGATIVE mg/dL
Nitrite, UA: POSITIVE — AB
POC PROTEIN,UA: 100 — AB
Spec Grav, UA: >=1.03 — AB (ref 1.010–1.025)
Urobilinogen, UA: 0.2 E.U./dL
pH, UA: 6 (ref 5.0–8.0)

## 2020-10-18 MED ORDER — CEPHALEXIN 500 MG PO CAPS: 500.0000 mg | ORAL_CAPSULE | Freq: Two times a day (BID) | ORAL | 0 refills | Status: AC

## 2020-10-18 NOTE — Progress Notes (Signed)
Virtual Visit via Telephone Note  I connected with  [name] on 10/18/20 at 10:30 AM EDT by telephone and verified that I am speaking with the correct person using two identifiers.   I discussed the limitations, risks, security and privacy concerns of performing an evaluation and management service by telephone and the availability of in person appointments. I also discussed with the patient that there may be a patient responsible charge related to this service. The patient expressed understanding and agreed to proceed.  Participating parties included in this telephone visit include: The patient and the nurse practitioner listed.  The patient is: at home I am: In the office  Subjective:    CC: dysuria, rhinorrhea, cough  HPI: Beth [name] is a 87 y.o. year old female presenting today via telephone visit to discuss dysuria, rhinorrhea, cough.   URINARY SYMPTOMS Patient reports that for the past week or so she has been having urinary symptoms.  She had seen the urologist on May 27 for similar symptoms but at that time her urine sample was normal.  States she felt better for a few days however about a week ago symptoms returned.  She is describing cloudy, dark urine with odor, urinary urgency, frequency, dysuria. Denies abdominal, flank, back pain, nausea, vomiting.    URI SYMPTOMS: Patient states that Monday night she started to feel bad. She has been having chills, fatigue, productive cough with pale yellow sputum, sore throat, wheezing, runny nose and watery eyes, body aches, headache 3 out of 10 behind her eyes, a couple of episodes of mild diarrhea.  She reports she feels horrible, "feels like I am not going to make it another day."  Reports that she has been to several doctors appointments over the past few weeks, and also attended a birthday party out of town last Saturday with approximately 88 guest, some of which had recently recovered from [place].  She was not aware of anyone who  is currently sick at the time of the party.  She has had 2 COVID vaccines.  She has been taking ibuprofen and over-the-counter medications.  She has been sure to remain hydrated. Denies chest pain, trouble breathing, confusion, fevers.    [Patient arrived for visit in person initially for UTI symptoms.  However she was stopped at check-in due to concerning upper respiratory symptoms.  Urine sample and COVID swab were obtained and then visit was completed via telephone to prevent exposure to other patients and staff.]    Past medical history, Surgical history, Family history not pertinant except as noted below, Social history, Allergies, and medications have been entered into the medical record, reviewed, and corrections made.   Review of Systems:  All review of systems negative except what is listed in the HPI  Objective:    General:  Patient speaking clearly in complete sentences. No shortness of breath noted.   Alert and oriented x3.   Normal judgment.  No apparent acute distress.  Impression and Recommendations:    1. Dysuria - Urine Culture - POCT URINALYSIS DIP (CLINITEK)  2. Viral upper respiratory tract infection 3. Suspected COVID-19 virus infection Presentation concerning for COVID infection. Will test and let her know results. Quarantine and OTC recommendations discussed. She would be interested in molnupiravir if result is positive. Tomorrow night will be day 5 so need to start tomorrow if indicated.   - Novel Coronavirus, NAA ([facility])  4. Proteinuria, unspecified type 5. Acute cystitis with hematuria UA indicative of UTI. Moderate  blood and proteinuria - would like to have her repeat UA in about 2 weeks to check for resolution. Order placed as future. Will go ahead and treat with keflex as last urine culture showed sensitivity to cephalosporins (though many years ago - will send sample for culture and adjust plan if needed).   - Urinalysis; Future - cephALEXin  (KEFLEX) 500 MG capsule; Take 1 capsule (500 mg total) by mouth 2 (two) times daily for 7 days.  Dispense: 14 capsule; Refill: 0   Patient aware of signs/symptoms requiring further/urgent evaluation.  Follow-up pending test results and as needed.     I discussed the assessment and treatment plan with the patient. The patient was provided an opportunity to ask questions and all were answered. The patient agreed with the plan and demonstrated an understanding of the instructions.   The patient was advised to call back or seek an in-person evaluation if the symptoms worsen or if the condition fails to improve as anticipated.  I provided 20 minutes of non-face-to-face time during this TELEPHONE encounter.    Terrilyn Saver, NP

## 2020-10-19 LAB — NOVEL CORONAVIRUS, NAA: SARS-CoV-2, NAA: NOT DETECTED

## 2020-10-19 LAB — SARS-COV-2, NAA 2 DAY TAT

## 2020-10-19 NOTE — Progress Notes (Signed)
Your COVID test was negative. It is very possible you have another virus causing your symptoms. How are you feeling? If not feeling any better, schedule an in person visit for early next week so you can be evaluated. If significant symptoms over the weekend, go to urgent care or the ER.

## 2020-10-20 LAB — URINE CULTURE
MICRO NUMBER:: 12043172
SPECIMEN QUALITY:: ADEQUATE

## 2020-10-20 LAB — URINALYSIS

## 2020-10-22 NOTE — Progress Notes (Signed)
Your urine culture did show a UTI, but the antibiotic I had already sent in should take care of it. Let us know if not feeling any better.

## 2020-10-26 DIAGNOSIS — M797 Fibromyalgia: Secondary | ICD-10-CM | POA: Insufficient documentation

## 2020-10-26 DIAGNOSIS — F419 Anxiety disorder, unspecified: Secondary | ICD-10-CM | POA: Insufficient documentation

## 2020-10-26 DIAGNOSIS — J45909 Unspecified asthma, uncomplicated: Secondary | ICD-10-CM | POA: Insufficient documentation

## 2020-10-26 DIAGNOSIS — T7840XA Allergy, unspecified, initial encounter: Secondary | ICD-10-CM | POA: Insufficient documentation

## 2020-10-26 DIAGNOSIS — M8580 Other specified disorders of bone density and structure, unspecified site: Secondary | ICD-10-CM | POA: Insufficient documentation

## 2020-10-26 DIAGNOSIS — K5792 Diverticulitis of intestine, part unspecified, without perforation or abscess without bleeding: Secondary | ICD-10-CM | POA: Insufficient documentation

## 2020-10-26 DIAGNOSIS — M1990 Unspecified osteoarthritis, unspecified site: Secondary | ICD-10-CM | POA: Insufficient documentation

## 2020-10-26 DIAGNOSIS — K224 Dyskinesia of esophagus: Secondary | ICD-10-CM | POA: Insufficient documentation

## 2020-10-26 DIAGNOSIS — H269 Unspecified cataract: Secondary | ICD-10-CM | POA: Insufficient documentation

## 2020-10-30 ENCOUNTER — Other Ambulatory Visit: Payer: Self-pay

## 2020-10-30 ENCOUNTER — Ambulatory Visit: Payer: Medicare HMO | Admitting: Cardiology

## 2020-10-30 VITALS — BP 148/82 | Ht 64.0 in | Wt 151.8 lb

## 2020-10-30 DIAGNOSIS — R0600 Dyspnea, unspecified: Secondary | ICD-10-CM

## 2020-10-30 DIAGNOSIS — R600 Localized edema: Secondary | ICD-10-CM

## 2020-10-30 DIAGNOSIS — R0789 Other chest pain: Secondary | ICD-10-CM

## 2020-10-30 DIAGNOSIS — E782 Mixed hyperlipidemia: Secondary | ICD-10-CM

## 2020-10-30 DIAGNOSIS — I10 Essential (primary) hypertension: Secondary | ICD-10-CM | POA: Diagnosis not present

## 2020-10-30 DIAGNOSIS — R0609 Other forms of dyspnea: Secondary | ICD-10-CM | POA: Insufficient documentation

## 2020-10-30 MED ORDER — POTASSIUM CHLORIDE ER 10 MEQ PO TBCR: EXTENDED_RELEASE_TABLET | ORAL | 2 refills | Status: DC

## 2020-10-30 MED ORDER — FUROSEMIDE 20 MG PO TABS: ORAL_TABLET | ORAL | 2 refills | Status: DC

## 2020-10-30 NOTE — Patient Instructions (Signed)
Medication Instructions:  Your physician has recommended you make the following change in your medication:  START: Lasix 20 mg Tuesday and Saturday START: Potassium 10 meq Tuesday and Saturday  *If you need a refill on your cardiac medications before your next appointment, please call your pharmacy*   Lab Work: None If you have labs (blood work) drawn today and your tests are completely normal, you will receive your results only by: [place] (if you have MyChart) OR A paper copy in the mail If you have any lab test that is abnormal or we need to change your treatment, we will call you to review the results.   Testing/Procedures: None   Follow-Up: At [facility], you and your health needs are our priority.  As part of our continuing mission to provide you with exceptional heart care, we have created designated Provider Care Teams.  These Care Teams include your primary Cardiologist (physician) and Advanced Practice Providers (APPs -  Physician Assistants and Nurse Practitioners) who all work together to provide you with the care you need, when you need it.  We recommend signing up for the patient portal called "MyChart".  Sign up information is provided on this After Visit Summary.  MyChart is used to connect with patients for Virtual Visits (Telemedicine).  Patients are able to view lab/test results, encounter notes, upcoming appointments, etc.  Non-urgent messages can be sent to your provider as well.   To learn more about what you can do with MyChart, go to [email or web address].    Your next appointment:   6 month(s)  The format for your next appointment:   In Person    Other Instructions

## 2020-10-30 NOTE — Progress Notes (Signed)
Cardiology Office Note:    Date:  10/30/2020   ID:  Beth [name], Beth [name] December 18, 1932, MRN [id number]  PCP:  Hali Marry, MD  Cardiologist:  None  Electrophysiologist:  None   Referring MD: Hali Marry, *   No chief complaint on file. Have had some chest tightness and sometimes shortness of breath  History of Present Illness:    Beth [name] is a 87 y.o. female with a hx of hypertension, hyperlipidemia statin intolerance is here today at the request of her primary care doctor to be evaluated for chest tightness as well as shortness of breath.  Patient is very hesitant to be here in is not openly or willing to give me information.  She notes that she had chest tightness once months ago and regrets telling her PCP about this because when she told her PCP she was then asked to see cardiology.  But once I try to talk to the patient she will not give me the description of her test chest tightness and admits to some exertional shortness of breath.  Past Medical History:  Diagnosis Date   Allergy    Anxiety    situational   Arthritis    Asthma    Cataract    COPD (chronic obstructive pulmonary disease) (HCC)    PER PT   Diverticulitis    Diverticulitis    Emphysema of lung ([place])    Esophageal dysmotility    Fibromyalgia    Hiatal hernia    Hyperlipidemia    Hypertension    Insomnia    Osteopenia    UTI (lower urinary tract infection)     Past Surgical History:  Procedure Laterality Date   ABDOMINAL HYSTERECTOMY     BLADDER SURGERY     tac   COLONOSCOPY     2008    Current Medications: Current Meds  Medication Sig   albuterol (VENTOLIN HFA) 108 (90 Base) MCG/ACT inhaler Inhale 2 puffs into the lungs every 6 (six) hours as needed for wheezing or shortness of breath.   calcium carbonate 1250 MG capsule Take 1,250 mg by mouth daily.   Cholecalciferol (VITAMIN D3) 1000 UNITS tablet Take 1,000 Units by mouth daily.   diltiazem (CARDIZEM SR) 60 MG 12 hr  capsule Take 1 capsule by mouth twice daily   furosemide (LASIX) 20 MG tablet Take on Tuesday and Saturday   ibuprofen (ADVIL,MOTRIN) 200 MG tablet Take 200 mg by mouth every 6 (six) hours as needed.   Multiple Vitamins-Minerals (PRESERVISION AREDS 2+MULTI VIT) CAPS Take by mouth. Preservision AREDS-2   pantoprazole (PROTONIX) 40 MG tablet Take 40 mg by mouth as needed.   potassium chloride (KLOR-CON) 10 MEQ tablet Take on Tuesday and Saturday   Pyridoxine HCl (VITAMIN B-6) 500 MG tablet Take 500 mg by mouth daily.   traZODone (DESYREL) 50 MG tablet Take 1 tablet (50 mg total) by mouth at bedtime as needed for sleep.   [DISCONTINUED] pantoprazole (PROTONIX) 40 MG tablet Take 1 tablet (40 mg total) by mouth daily.     Allergies:   Doxycycline, Lunesta [eszopiclone], Pravachol [pravastatin], Trazodone and nefazodone, and Zocor [simvastatin]   Social History   Socioeconomic History   Marital status: Widowed    Spouse name: Not on file   Number of children: 2   Years of education: 12th   Highest education level: High school graduate  Occupational History   Occupation: retired  Tobacco Use   Smoking status: Former    Psychologist, educational  years: 0.00   Smokeless tobacco: Never   Tobacco comments:    smoker as a teenager  Vaping Use   Vaping Use: Never used  Substance and Sexual Activity   Alcohol use: No    Alcohol/week: 0.0 standard drinks    Comment: occasionally   Drug use: No   Sexual activity: Not on file  Other Topics Concern   Not on file  Social History Narrative   Lives alone. She lost her son a year ago and had to put her dog to sleep in December. Her daughter lives 30  mins away. Likes to work on puzzles, word searches and read about history in her free time. Likes to do family research.   Social Determinants of Health   Financial Resource Strain: Low Risk    Difficulty of Paying Living Expenses: Not hard at all  Food Insecurity: No Food Insecurity   Worried About Sales executive in the Last Year: Never true   [place] in the Last Year: Never true  Transportation Needs: No Transportation Needs   Lack of Transportation (Medical): No   Lack of Transportation (Non-Medical): No  Physical Activity: Inactive   Days of Exercise per Week: 0 days   Minutes of Exercise per Session: 0 min  Stress: No Stress Concern Present   Feeling of Stress : Not at all  Social Connections: Socially Isolated   Frequency of Communication with Friends and Family: More than three times a week   Frequency of Social Gatherings with Friends and Family: Twice a week   Attends Religious Services: Never   Marine scientist or Organizations: No   Attends Archivist Meetings: Never   Marital Status: Widowed     Family History: The patient's family history includes Cancer in her mother; Dementia in her mother; Depression in her father; Heart disease in her father; Hyperlipidemia in her father and mother; Hypertension in her mother. There is no history of Colon cancer, Esophageal cancer, Stomach cancer, or Rectal cancer.  ROS:   Review of Systems  Constitution: Negative for decreased appetite, fever and weight gain.  HENT: Negative for congestion, ear discharge, hoarse voice and sore throat.   Eyes: Negative for discharge, redness, vision loss in right eye and visual halos.  Cardiovascular: Has had chest tightness and dyspnea on exertion.  Negative for orthopnea and palpitations.  Respiratory: Negative for cough, hemoptysis, shortness of breath and snoring.   Endocrine: Negative for heat intolerance and polyphagia.  Hematologic/Lymphatic: Negative for bleeding problem. Does not bruise/bleed easily.  Skin: Negative for flushing, nail changes, rash and suspicious lesions.  Musculoskeletal: Negative for arthritis, joint pain, muscle cramps, myalgias, neck pain and stiffness.  Gastrointestinal: Negative for abdominal pain, bowel incontinence, diarrhea and excessive appetite.   Genitourinary: Negative for decreased libido, genital sores and incomplete emptying.  Neurological: Negative for brief paralysis, focal weakness, headaches and loss of balance.  Psychiatric/Behavioral: Negative for altered mental status, depression and suicidal ideas.  Allergic/Immunologic: Negative for HIV exposure and persistent infections.    EKGs/Labs/Other Studies Reviewed:    The following studies were reviewed today:   EKG:  The ekg ordered today demonstrates sinus rhythm, heart rate 74 bpm  Transthoracic echocardiogram which was done July 2017 Left ventricle:  The cavity size was normal. There was mild  concentric hypertrophy. Systolic function was normal. The estimated  ejection fraction was in the range of 55% to 60%. Wall motion was  normal; there were no regional  wall motion abnormalities. The  transmitral flow pattern was normal. The deceleration time of the  early transmitral flow velocity was normal. The pulmonary vein flow  pattern was normal. The tissue Doppler parameters were normal. Left  ventricular diastolic function parameters were normal.   -------------------------------------------------------------------  Aortic valve:   Trileaflet; normal thickness leaflets. Mobility was  not restricted.  Doppler:  Transvalvular velocity was within the  normal range. There was no stenosis. There was no regurgitation.     -------------------------------------------------------------------  Aorta:  Aortic root: The aortic root was normal in size.   -------------------------------------------------------------------  Mitral valve:   Structurally normal valve.   Mobility was not  restricted.  Doppler:  Transvalvular velocity was within the normal  range. There was no evidence for stenosis. There was trivial  regurgitation.    Peak gradient (D): 2 mm Hg.   -------------------------------------------------------------------  Left atrium:  The atrium was mildly dilated.    -------------------------------------------------------------------  Right ventricle:  The cavity size was normal. Wall thickness was  normal. Systolic function was normal.   -------------------------------------------------------------------  Pulmonic valve:   Poorly visualized.  The valve appears to be  grossly normal.    Doppler:  Transvalvular velocity was within the  normal range. There was no evidence for stenosis. There was trivial  regurgitation.   -------------------------------------------------------------------  Tricuspid valve:   Structurally normal valve.    Doppler:  Transvalvular velocity was within the normal range. There was no  regurgitation.   -------------------------------------------------------------------  Pulmonary artery:   The main pulmonary artery was normal-sized.  Systolic pressure was within the normal range.   -------------------------------------------------------------------  Right atrium:  The atrium was normal in size.   -------------------------------------------------------------------  Pericardium:  There was no pericardial effusion.   -------------------------------------------------------------------  Systemic veins:  Inferior vena cava: The vessel was normal in size.   -------------------------------------------------------------------  Measurements  Recent Labs: 09/25/2020: ALT 10; BUN 16; Creat 0.74; Hemoglobin 13.7; Platelets 215; Potassium 4.4; Sodium 137; TSH 3.59  Recent Lipid Panel    Component Value Date/Time   CHOL 216 (H) 02/24/2020 1514   TRIG 180 (H) 02/24/2020 1514   HDL 47 (L) 02/24/2020 1514   CHOLHDL 4.6 02/24/2020 1514   VLDL 25 02/20/2015 1356   LDLCALC 137 (H) 02/24/2020 1514    Physical Exam:    VS:  BP (!) 148/82 (BP Location: Left Arm, Patient Position: Sitting, Cuff Size: Normal)   Ht 5\' 4"  (1.626 m)   Wt 151 lb 12.8 oz (68.9 kg)   BMI 26.06 kg/m     Wt Readings from Last 3 Encounters:  10/30/20  151 lb 12.8 oz (68.9 kg)  10/11/20 151 lb (68.5 kg)  09/25/20 147 lb (66.7 kg)     GEN: Well nourished, well developed in no acute distress HEENT: Normal NECK: Positive for JVD; No carotid bruits LYMPHATICS: No lymphadenopathy CARDIAC: S1S2 noted,RRR, no murmurs, rubs, gallops RESPIRATORY:  Clear to auscultation without rales, wheezing or rhonchi  ABDOMEN: Soft, non-tender, non-distended, +bowel sounds, no guarding. EXTREMITIES: +1 leg edema edema, No cyanosis, no clubbing MUSCULOSKELETAL:  No deformity  SKIN: Warm and dry NEUROLOGIC:  Alert and oriented x 3, non-focal PSYCHIATRIC:  Normal affect, good insight  ASSESSMENT:    1. Chest tightness   2. Dyspnea on exertion   3. HYPERTENSION, BENIGN   4. Mixed hyperlipidemia   5. Bilateral leg edema    PLAN:    She has risk factors for coronary artery disease as well as her symptomatology ideally I would like  to the patient undergo a nuclear stress test but she has declined on any further testing.  She has bilateral leg edema with some JVD I like to repeat her echocardiogram to reassess her LV function and assess for any diastolic dysfunction the patient also has declined on any further testing.  She is agreeable to take cautious diuretic so we will give her Lasix 20 mg Tuesday and Saturdays along with potassium supplement.  She is statin intolerant and at this time prefer not to do anything further.  In terms of her hypertension she is on Cardizem we will continue this for now.  Hopefully her cautious diuretics can also help with her blood pressure.  The patient is in agreement with the above plan. The patient left the office in stable condition.  The patient will follow up in 6 months or sooner if needed.   Medication Adjustments/Labs and Tests Ordered: Current medicines are reviewed at length with the patient today.  Concerns regarding medicines are outlined above.  Orders Placed This Encounter  Procedures   EKG 12-Lead     Meds ordered this encounter  Medications   furosemide (LASIX) 20 MG tablet    Sig: Take on Tuesday and Saturday    Dispense:  35 tablet    Refill:  2   potassium chloride (KLOR-CON) 10 MEQ tablet    Sig: Take on Tuesday and Saturday    Dispense:  35 tablet    Refill:  2     Patient Instructions  Medication Instructions:  Your physician has recommended you make the following change in your medication:  START: Lasix 20 mg Tuesday and Saturday START: Potassium 10 meq Tuesday and Saturday  *If you need a refill on your cardiac medications before your next appointment, please call your pharmacy*   Lab Work: None If you have labs (blood work) drawn today and your tests are completely normal, you will receive your results only by: [place] (if you have MyChart) OR A paper copy in the mail If you have any lab test that is abnormal or we need to change your treatment, we will call you to review the results.   Testing/Procedures: None   Follow-Up: At [facility], you and your health needs are our priority.  As part of our continuing mission to provide you with exceptional heart care, we have created designated Provider Care Teams.  These Care Teams include your primary Cardiologist (physician) and Advanced Practice Providers (APPs -  Physician Assistants and Nurse Practitioners) who all work together to provide you with the care you need, when you need it.  We recommend signing up for the patient portal called "MyChart".  Sign up information is provided on this After Visit Summary.  MyChart is used to connect with patients for Virtual Visits (Telemedicine).  Patients are able to view lab/test results, encounter notes, upcoming appointments, etc.  Non-urgent messages can be sent to your provider as well.   To learn more about what you can do with MyChart, go to [email or web address].    Your next appointment:   6 month(s)  The format for your next appointment:   In  Person    Other Instructions    Adopting a Healthy Lifestyle.  Know what a healthy weight is for you (roughly BMI <25) and aim to maintain this   Aim for 7+ servings of fruits and vegetables daily   65-80+ fluid ounces of water or unsweet tea for healthy kidneys   Limit to max 1  drink of alcohol per day; avoid smoking/tobacco   Limit animal fats in diet for cholesterol and heart health - choose grass fed whenever available   Avoid highly processed foods, and foods high in saturated/trans fats   Aim for low stress - take time to unwind and care for your mental health   Aim for 150 min of moderate intensity exercise weekly for heart health, and weights twice weekly for bone health   Aim for 7-9 hours of sleep daily   When it comes to diets, agreement about the perfect plan isnt easy to find, even among the experts. Experts at the [employer] developed an idea known as the Healthy Eating Plate. Just imagine a plate divided into logical, healthy portions.   The emphasis is on diet quality:   Load up on vegetables and fruits - one-half of your plate: Aim for color and variety, and remember that potatoes dont count.   Go for whole grains - one-quarter of your plate: Whole wheat, barley, wheat berries, quinoa, oats, brown rice, and foods made with them. If you want pasta, go with whole wheat pasta.   Protein power - one-quarter of your plate: Fish, chicken, beans, and nuts are all healthy, versatile protein sources. Limit red meat.   The diet, however, does go beyond the plate, offering a few other suggestions.   Use healthy plant oils, such as olive, canola, soy, corn, sunflower and peanut. Check the labels, and avoid partially hydrogenated oil, which have unhealthy trans fats.   If youre thirsty, drink water. Coffee and tea are good in moderation, but skip sugary drinks and limit milk and dairy products to one or two daily servings.   The type of  carbohydrate in the diet is more important than the amount. Some sources of carbohydrates, such as vegetables, fruits, whole grains, and beans-are healthier than others.   Finally, stay active  Signed, Berniece Salines, DO  10/30/2020 3:21 PM    [facility]

## 2020-10-31 ENCOUNTER — Other Ambulatory Visit: Payer: Self-pay | Admitting: Family Medicine

## 2020-11-05 ENCOUNTER — Telehealth: Payer: Self-pay

## 2020-11-05 ENCOUNTER — Encounter: Payer: Self-pay | Admitting: Family Medicine

## 2020-11-05 ENCOUNTER — Ambulatory Visit (INDEPENDENT_AMBULATORY_CARE_PROVIDER_SITE_OTHER): Payer: Medicare HMO | Admitting: Family Medicine

## 2020-11-05 ENCOUNTER — Ambulatory Visit (INDEPENDENT_AMBULATORY_CARE_PROVIDER_SITE_OTHER): Payer: Medicare HMO

## 2020-11-05 ENCOUNTER — Other Ambulatory Visit: Payer: Self-pay

## 2020-11-05 VITALS — BP 125/78 | HR 84 | Temp 97.7°F

## 2020-11-05 DIAGNOSIS — J441 Chronic obstructive pulmonary disease with (acute) exacerbation: Secondary | ICD-10-CM | POA: Diagnosis not present

## 2020-11-05 DIAGNOSIS — J439 Emphysema, unspecified: Secondary | ICD-10-CM | POA: Diagnosis not present

## 2020-11-05 DIAGNOSIS — J22 Unspecified acute lower respiratory infection: Secondary | ICD-10-CM

## 2020-11-05 DIAGNOSIS — J9811 Atelectasis: Secondary | ICD-10-CM | POA: Diagnosis not present

## 2020-11-05 DIAGNOSIS — R197 Diarrhea, unspecified: Secondary | ICD-10-CM | POA: Diagnosis not present

## 2020-11-05 DIAGNOSIS — K449 Diaphragmatic hernia without obstruction or gangrene: Secondary | ICD-10-CM | POA: Diagnosis not present

## 2020-11-05 DIAGNOSIS — R0602 Shortness of breath: Secondary | ICD-10-CM | POA: Diagnosis not present

## 2020-11-05 DIAGNOSIS — J449 Chronic obstructive pulmonary disease, unspecified: Secondary | ICD-10-CM | POA: Diagnosis not present

## 2020-11-05 DIAGNOSIS — R079 Chest pain, unspecified: Secondary | ICD-10-CM | POA: Diagnosis not present

## 2020-11-05 MED ORDER — FLUTICASONE PROPIONATE HFA 44 MCG/ACT IN AERO: 2.0000 | INHALATION_SPRAY | Freq: Two times a day (BID) | RESPIRATORY_TRACT | 12 refills | Status: DC

## 2020-11-05 MED ORDER — PREDNISONE 20 MG PO TABS: 40.0000 mg | ORAL_TABLET | Freq: Every day | ORAL | 0 refills | Status: AC

## 2020-11-05 MED ORDER — ALBUTEROL SULFATE HFA 108 (90 BASE) MCG/ACT IN AERS: 2.0000 | INHALATION_SPRAY | Freq: Four times a day (QID) | RESPIRATORY_TRACT | 99 refills | Status: DC | PRN

## 2020-11-05 MED ORDER — AZITHROMYCIN 250 MG PO TABS: ORAL_TABLET | ORAL | 0 refills | Status: AC

## 2020-11-05 NOTE — Progress Notes (Signed)
Acute Office Visit  Subjective:    Patient ID: Beth [name], female    DOB: [birth year], 87 y.o.   MRN: [id number]  Chief Complaint  Patient presents with   Cough    HPI Patient is in today for cough.   6/18 attended a birthday dinner. The following Wednesday she started with URI symptoms. Telephone visit and dropped by for UA which was positive for UTI and COVID test which was negative.  Finished ABX for UTI but URI symptoms did not improve at all. Still has some mild urgency, but other UTI symptoms resolved (requesting her to drop off another UA to see if hematuria has resolved - unable to void in office today).   Reports she is still having: headache, chills, sweats, fatigue, body aches, can't breathe, lungs hurt (worse right lower posterior), off and on diarrhea (chronic stomach problems - yellow, seedy, smells horrible, looks like a baby's diaper), bad cough, a lot of yellow-white foamy sputum, "messing with my mind - can't think about what I'm wanting to think about." Feeling terrible.   No fevers, bloody sputum, abdominal pain, chest pains, signs of dehydration, nausea, vomiting.     Past Medical History:  Diagnosis Date   Allergy    Anxiety    situational   Arthritis    Asthma    Cataract    COPD (chronic obstructive pulmonary disease) (HCC)    PER PT   Diverticulitis    Diverticulitis    Emphysema of lung ([place])    Esophageal dysmotility    Fibromyalgia    Hiatal hernia    Hyperlipidemia    Hypertension    Insomnia    Osteopenia    UTI (lower urinary tract infection)     Past Surgical History:  Procedure Laterality Date   ABDOMINAL HYSTERECTOMY     BLADDER SURGERY     tac   COLONOSCOPY     2008    Family History  Problem Relation Age of Onset   Cancer Mother        breast   Hyperlipidemia Mother    Hypertension Mother    Dementia Mother    Heart disease Father        MI   Depression Father    Hyperlipidemia Father    Colon cancer Neg Hx     Esophageal cancer Neg Hx    Stomach cancer Neg Hx    Rectal cancer Neg Hx     Social History   Socioeconomic History   Marital status: Widowed    Spouse name: Not on file   Number of children: 2   Years of education: 12th   Highest education level: High school graduate  Occupational History   Occupation: retired  Tobacco Use   Smoking status: Former    Pack years: 0.00   Smokeless tobacco: Never   Tobacco comments:    smoker as a teenager  Scientific laboratory technician Use: Never used  Substance and Sexual Activity   Alcohol use: No    Alcohol/week: 0.0 standard drinks    Comment: occasionally   Drug use: No   Sexual activity: Not on file  Other Topics Concern   Not on file  Social History Narrative   Lives alone. She lost her son a year ago and had to put her dog to sleep in December. Her daughter lives 30  mins away. Likes to work on puzzles, word searches and read about history in her free time. Likes  to do family research.   Social Determinants of Health   Financial Resource Strain: Low Risk    Difficulty of Paying Living Expenses: Not hard at all  Food Insecurity: No Food Insecurity   Worried About Charity fundraiser in the Last Year: Never true   [place] in the Last Year: Never true  Transportation Needs: No Transportation Needs   Lack of Transportation (Medical): No   Lack of Transportation (Non-Medical): No  Physical Activity: Inactive   Days of Exercise per Week: 0 days   Minutes of Exercise per Session: 0 min  Stress: No Stress Concern Present   Feeling of Stress : Not at all  Social Connections: Socially Isolated   Frequency of Communication with Friends and Family: More than three times a week   Frequency of Social Gatherings with Friends and Family: Twice a week   Attends Religious Services: Never   Marine scientist or Organizations: No   Attends Archivist Meetings: Never   Marital Status: Widowed  Human resources officer Violence: Not  At Risk   Fear of Current or Ex-Partner: No   Emotionally Abused: No   Physically Abused: No   Sexually Abused: No    Outpatient Medications Prior to Visit  Medication Sig Dispense Refill   calcium carbonate 1250 MG capsule Take 1,250 mg by mouth daily.     Cholecalciferol (VITAMIN D3) 1000 UNITS tablet Take 1,000 Units by mouth daily.     diltiazem (CARDIZEM SR) 60 MG 12 hr capsule Take 1 capsule by mouth twice daily 60 capsule 5   furosemide (LASIX) 20 MG tablet Take on Tuesday and Saturday 35 tablet 2   ibuprofen (ADVIL,MOTRIN) 200 MG tablet Take 200 mg by mouth every 6 (six) hours as needed.     Multiple Vitamins-Minerals (PRESERVISION AREDS 2+MULTI VIT) CAPS Take by mouth. Preservision AREDS-2     pantoprazole (PROTONIX) 40 MG tablet Take 1 tablet by mouth once daily 30 tablet 0   potassium chloride (KLOR-CON) 10 MEQ tablet Take on Tuesday and Saturday 35 tablet 2   Pyridoxine HCl (VITAMIN B-6) 500 MG tablet Take 500 mg by mouth daily.     traZODone (DESYREL) 50 MG tablet Take 1 tablet (50 mg total) by mouth at bedtime as needed for sleep. 30 tablet 1   albuterol (VENTOLIN HFA) 108 (90 Base) MCG/ACT inhaler Inhale 2 puffs into the lungs every 6 (six) hours as needed for wheezing or shortness of breath. 18 g prn   Facility-Administered Medications Prior to Visit  Medication Dose Route Frequency Provider Last Rate Last Admin   gadoxetate disodium (EOVIST) injection 6.5 mL  6.5 mL Intravenous Once Hali Marry, MD        Allergies  Allergen Reactions   Doxycycline Other (See Comments)    Blisters on mouth and lips   Lunesta [Eszopiclone] Other (See Comments)    Confused mentally   Pravachol [Pravastatin]     Fatigued.    Trazodone And Nefazodone Other (See Comments)    Made her hyper   Zocor [Simvastatin] Other (See Comments)    Myalgias and memory loss    Review of Systems All review of systems negative except what is listed in the HPI     Objective:     Physical Exam Vitals reviewed.  Constitutional:      Appearance: Normal appearance.  HENT:     Head: Normocephalic and atraumatic.  Cardiovascular:     Rate and Rhythm: Regular  rhythm.     Heart sounds: Normal heart sounds.  Pulmonary:     Effort: No respiratory distress.     Breath sounds: No stridor. Wheezing and rales present.  Abdominal:     Palpations: Abdomen is soft.  Musculoskeletal:     Cervical back: Normal range of motion.  Lymphadenopathy:     Cervical: No cervical adenopathy.  Skin:    General: Skin is warm and dry.     Findings: No rash.  Neurological:     General: No focal deficit present.     Mental Status: She is alert and oriented to person, place, and time.  Psychiatric:        Mood and Affect: Mood normal.        Behavior: Behavior normal.        Thought Content: Thought content normal.        Judgment: Judgment normal.    BP 125/78   Pulse 84   Temp 97.7 F (36.5 C)   SpO2 96%  Wt Readings from Last 3 Encounters:  10/30/20 151 lb 12.8 oz (68.9 kg)  10/11/20 151 lb (68.5 kg)  09/25/20 147 lb (66.7 kg)    Health Maintenance Due  Topic Date Due   Zoster Vaccines- Shingrix (1 of 2) Never done   COVID-19 Vaccine (4 - Booster for Pfizer series) 06/20/2020    There are no preventive care reminders to display for this patient.   Lab Results  Component Value Date   TSH 3.59 09/25/2020   Lab Results  Component Value Date   WBC 6.0 09/25/2020   HGB 13.7 09/25/2020   HCT 41.7 09/25/2020   MCV 93.1 09/25/2020   PLT 215 09/25/2020   Lab Results  Component Value Date   NA 137 09/25/2020   K 4.4 09/25/2020   CO2 30 09/25/2020   GLUCOSE 85 09/25/2020   BUN 16 09/25/2020   CREATININE 0.74 09/25/2020   BILITOT 0.4 09/25/2020   ALKPHOS 57 07/25/2016   AST 14 09/25/2020   ALT 10 09/25/2020   PROT 7.1 09/25/2020   ALBUMIN 4.2 07/25/2016   CALCIUM 10.3 09/25/2020   ANIONGAP 8 07/25/2016   Lab Results  Component Value Date   CHOL 216  (H) 02/24/2020   Lab Results  Component Value Date   HDL 47 (L) 02/24/2020   Lab Results  Component Value Date   LDLCALC 137 (H) 02/24/2020   Lab Results  Component Value Date   TRIG 180 (H) 02/24/2020   Lab Results  Component Value Date   CHOLHDL 4.6 02/24/2020   No results found for: HGBA1C     Assessment & Plan:   1. COPD exacerbation ([place]) 2. Lower respiratory infection Hx of COPD with presentation concerning for exacerbation vs. Pneumonia. Getting xray. Sending in z-pak and prednisone to start today. If xray shows pneumonia, will add augmentin. Refilling albuterol inhaler and adding flovent for daily use. Giving incentive spirometer and instructions explained. Patient aware of signs/symptoms requiring further/urgent evaluation.  - DG Chest 2 View; Future - predniSONE (DELTASONE) 20 MG tablet; Take 2 tablets (40 mg total) by mouth daily with breakfast for 5 days.  Dispense: 10 tablet; Refill: 0 - azithromycin (ZITHROMAX) 250 MG tablet; Take 2 tablets on day 1, then 1 tablet daily on days 2 through 5  Dispense: 6 tablet; Refill: 0 - fluticasone (FLOVENT HFA) 44 MCG/ACT inhaler; Inhale 2 puffs into the lungs in the morning and at bedtime.  Dispense: 1 each; Refill: 12 -  albuterol (VENTOLIN HFA) 108 (90 Base) MCG/ACT inhaler; Inhale 2 puffs into the lungs every 6 (six) hours as needed for wheezing or shortness of breath.  Dispense: 18 g; Refill: prn  3. Diarrhea, unspecified type Description of stool similar to c.diff. Will get her to bring a sample.  - Clostridium difficile Toxin B, Qualitative, Real-Time PCR  Follow-up if not starting to improve or if symptoms worsen.    [employer] Olevia Bowens, DNP, FNP-C

## 2020-11-05 NOTE — Telephone Encounter (Signed)
Called pt to schedule sleep study. She said she is really sick right now and she may have pneumonia. She will call back when she is feeling better and able to schedule.

## 2020-11-06 DIAGNOSIS — R197 Diarrhea, unspecified: Secondary | ICD-10-CM | POA: Diagnosis not present

## 2020-11-06 NOTE — Progress Notes (Signed)
Your chest xray was more consistent with COPD instead of pneumonia. I think we should continue with what I have already prescribed you. Notify us if not starting to feel better after a few days of medications.

## 2020-11-07 NOTE — Progress Notes (Signed)
The urine sample you left looks much better than the previous one. If you begin having urinary symptoms again, please let us know.

## 2020-11-08 LAB — URINALYSIS, ROUTINE W REFLEX MICROSCOPIC
Bacteria, UA: NONE SEEN /HPF
Bilirubin Urine: NEGATIVE
Glucose, UA: NEGATIVE
Hgb urine dipstick: NEGATIVE
Hyaline Cast: NONE SEEN /LPF
Ketones, ur: NEGATIVE
Nitrite: NEGATIVE
Protein, ur: NEGATIVE
RBC / HPF: NONE SEEN /HPF (ref 0–2)
Specific Gravity, Urine: 1.016 (ref 1.001–1.035)
WBC, UA: NONE SEEN /HPF (ref 0–5)
pH: <=5 (ref 5.0–8.0)

## 2020-11-08 LAB — MICROSCOPIC MESSAGE

## 2020-11-08 LAB — CLOSTRIDIUM DIFFICILE TOXIN B, QUALITATIVE, REAL-TIME PCR: Toxigenic C. Difficile by PCR: NOT DETECTED

## 2020-11-08 NOTE — Progress Notes (Signed)
Stool sample was negative. Please let us know if the diarrhea persists.

## 2020-11-09 ENCOUNTER — Ambulatory Visit (INDEPENDENT_AMBULATORY_CARE_PROVIDER_SITE_OTHER): Payer: Medicare HMO | Admitting: Family Medicine

## 2020-11-09 ENCOUNTER — Encounter: Payer: Self-pay | Admitting: Family Medicine

## 2020-11-09 VITALS — BP 168/74 | HR 79 | Temp 97.9°F | Resp 17

## 2020-11-09 DIAGNOSIS — G4700 Insomnia, unspecified: Secondary | ICD-10-CM

## 2020-11-09 DIAGNOSIS — J441 Chronic obstructive pulmonary disease with (acute) exacerbation: Secondary | ICD-10-CM | POA: Diagnosis not present

## 2020-11-09 NOTE — Progress Notes (Signed)
Established Patient Office Visit  Subjective:  Patient ID: Beth [name], female    DOB: Jan 04, 1933  Age: 87 y.o. MRN: [id number]  CC:  Chief Complaint  Patient presents with   copd follow-up    HPI Beth [name] presents for COPD exacerbation follow-up.   Seen last week, 10/30/20, for coughing, malaise and negative COVID test. Concerning lung sounds with increased sputum. Xray suggested COPD exacerbation and she was treated with inhaler, incentive spirometry, prednisone and antibiotics.   Reports she continues to feel a little bit better every day. Yesterday was a really good day and she was able to go out and get her nails done. Reports cough has significantly improved - she still gets a little bit of sputum up but it is not as much or as thick as it had been. No fevers. Energy level starting to improve, but continues to have trouble sleeping. She has seen neuro/sleep specialist and they started her on trazodone and are considering sleep study. She is trying to improve her schedule and sleep hygiene.      Past Medical History:  Diagnosis Date   Allergy    Anxiety    situational   Arthritis    Asthma    Cataract    COPD (chronic obstructive pulmonary disease) (HCC)    PER PT   Diverticulitis    Diverticulitis    Emphysema of lung ([place])    Esophageal dysmotility    Fibromyalgia    Hiatal hernia    Hyperlipidemia    Hypertension    Insomnia    Osteopenia    UTI (lower urinary tract infection)     Past Surgical History:  Procedure Laterality Date   ABDOMINAL HYSTERECTOMY     BLADDER SURGERY     tac   COLONOSCOPY     2008    Family History  Problem Relation Age of Onset   Cancer Mother        breast   Hyperlipidemia Mother    Hypertension Mother    Dementia Mother    Heart disease Father        MI   Depression Father    Hyperlipidemia Father    Colon cancer Neg Hx    Esophageal cancer Neg Hx    Stomach cancer Neg Hx    Rectal cancer Neg Hx     Social  History   Socioeconomic History   Marital status: Widowed    Spouse name: Not on file   Number of children: 2   Years of education: 12th   Highest education level: High school graduate  Occupational History   Occupation: retired  Tobacco Use   Smoking status: Former   Smokeless tobacco: Never   Tobacco comments:    smoker as a teenager  Scientific laboratory technician Use: Never used  Substance and Sexual Activity   Alcohol use: No    Alcohol/week: 0.0 standard drinks    Comment: occasionally   Drug use: No   Sexual activity: Not on file  Other Topics Concern   Not on file  Social History Narrative   Lives alone. She lost her son a year ago and had to put her dog to sleep in December. Her daughter lives 30  mins away. Likes to work on puzzles, word searches and read about history in her free time. Likes to do family research.   Social Determinants of Health   Financial Resource Strain: Low Risk    Difficulty of  Paying Living Expenses: Not hard at all  Food Insecurity: No Food Insecurity   Worried About [place] in the Last Year: Never true   Ran Out of Food in the Last Year: Never true  Transportation Needs: No Transportation Needs   Lack of Transportation (Medical): No   Lack of Transportation (Non-Medical): No  Physical Activity: Inactive   Days of Exercise per Week: 0 days   Minutes of Exercise per Session: 0 min  Stress: No Stress Concern Present   Feeling of Stress : Not at all  Social Connections: Socially Isolated   Frequency of Communication with Friends and Family: More than three times a week   Frequency of Social Gatherings with Friends and Family: Twice a week   Attends Religious Services: Never   Marine scientist or Organizations: No   Attends Archivist Meetings: Never   Marital Status: Widowed  Human resources officer Violence: Not At Risk   Fear of Current or Ex-Partner: No   Emotionally Abused: No   Physically Abused: No   Sexually  Abused: No    Outpatient Medications Prior to Visit  Medication Sig Dispense Refill   albuterol (VENTOLIN HFA) 108 (90 Base) MCG/ACT inhaler Inhale 2 puffs into the lungs every 6 (six) hours as needed for wheezing or shortness of breath. 18 g prn   azithromycin (ZITHROMAX) 250 MG tablet Take 2 tablets on day 1, then 1 tablet daily on days 2 through 5 6 tablet 0   calcium carbonate 1250 MG capsule Take 1,250 mg by mouth daily.     Cholecalciferol (VITAMIN D3) 1000 UNITS tablet Take 1,000 Units by mouth daily.     diltiazem (CARDIZEM SR) 60 MG 12 hr capsule Take 1 capsule by mouth twice daily 60 capsule 5   fluticasone (FLOVENT HFA) 44 MCG/ACT inhaler Inhale 2 puffs into the lungs in the morning and at bedtime. 1 each 12   furosemide (LASIX) 20 MG tablet Take on Tuesday and Saturday 35 tablet 2   ibuprofen (ADVIL,MOTRIN) 200 MG tablet Take 200 mg by mouth every 6 (six) hours as needed.     Multiple Vitamins-Minerals (PRESERVISION AREDS 2+MULTI VIT) CAPS Take by mouth. Preservision AREDS-2     pantoprazole (PROTONIX) 40 MG tablet Take 1 tablet by mouth once daily 30 tablet 0   potassium chloride (KLOR-CON) 10 MEQ tablet Take on Tuesday and Saturday 35 tablet 2   predniSONE (DELTASONE) 20 MG tablet Take 2 tablets (40 mg total) by mouth daily with breakfast for 5 days. 10 tablet 0   Pyridoxine HCl (VITAMIN B-6) 500 MG tablet Take 500 mg by mouth daily.     traZODone (DESYREL) 50 MG tablet Take 1 tablet (50 mg total) by mouth at bedtime as needed for sleep. 30 tablet 1   Facility-Administered Medications Prior to Visit  Medication Dose Route Frequency Provider Last Rate Last Admin   gadoxetate disodium (EOVIST) injection 6.5 mL  6.5 mL Intravenous Once Hali Marry, MD        Allergies  Allergen Reactions   Doxycycline Other (See Comments)    Blisters on mouth and lips   Lunesta [Eszopiclone] Other (See Comments)    Confused mentally   Pravachol [Pravastatin]     Fatigued.     Trazodone And Nefazodone Other (See Comments)    Made her hyper   Zocor [Simvastatin] Other (See Comments)    Myalgias and memory loss    ROS Review of Systems All review of  systems negative except what is listed in the HPI    Objective:    Physical Exam Vitals reviewed.  Constitutional:      Appearance: Normal appearance.  Cardiovascular:     Rate and Rhythm: Normal rate and regular rhythm.     Heart sounds: Normal heart sounds.  Pulmonary:     Effort: Pulmonary effort is normal.     Comments: Faint rhonchi cleared with coughing; significant improvement overall from last visit Skin:    General: Skin is warm and dry.  Neurological:     General: No focal deficit present.     Mental Status: She is alert and oriented to person, place, and time. Mental status is at baseline.  Psychiatric:        Mood and Affect: Mood normal.        Behavior: Behavior normal.        Thought Content: Thought content normal.        Judgment: Judgment normal.    BP (!) 177/85   Pulse 79   Temp 97.9 F (36.6 C)   Resp 17   SpO2 97%  Wt Readings from Last 3 Encounters:  10/30/20 151 lb 12.8 oz (68.9 kg)  10/11/20 151 lb (68.5 kg)  09/25/20 147 lb (66.7 kg)     Health Maintenance Due  Topic Date Due   Zoster Vaccines- Shingrix (1 of 2) Never done   COVID-19 Vaccine (4 - Booster for Pfizer series) 06/20/2020    There are no preventive care reminders to display for this patient.  Lab Results  Component Value Date   TSH 3.59 09/25/2020   Lab Results  Component Value Date   WBC 6.0 09/25/2020   HGB 13.7 09/25/2020   HCT 41.7 09/25/2020   MCV 93.1 09/25/2020   PLT 215 09/25/2020   Lab Results  Component Value Date   NA 137 09/25/2020   K 4.4 09/25/2020   CO2 30 09/25/2020   GLUCOSE 85 09/25/2020   BUN 16 09/25/2020   CREATININE 0.74 09/25/2020   BILITOT 0.4 09/25/2020   ALKPHOS 57 07/25/2016   AST 14 09/25/2020   ALT 10 09/25/2020   PROT 7.1 09/25/2020   ALBUMIN 4.2  07/25/2016   CALCIUM 10.3 09/25/2020   ANIONGAP 8 07/25/2016   Lab Results  Component Value Date   CHOL 216 (H) 02/24/2020   Lab Results  Component Value Date   HDL 47 (L) 02/24/2020   Lab Results  Component Value Date   LDLCALC 137 (H) 02/24/2020   Lab Results  Component Value Date   TRIG 180 (H) 02/24/2020   Lab Results  Component Value Date   CHOLHDL 4.6 02/24/2020   No results found for: HGBA1C    Assessment & Plan:   1. COPD exacerbation ([place]) Significant improvement from last week! Finished steroids and antibiotics. Continue incentive spirometry and inhalers as prescribed. Pulmonary hygiene encouraged. Patient aware of signs/symptoms requiring further/urgent evaluation.   2. Insomnia, unspecified type Ongoing issue for patient. Reinforced sleep hygiene practices and recommend she follow-up with the sleep specialist sooner if she is not noticing improvements after doing what she recommended.    3. Hypertension BP elevated today, has been good at prior appointments. Recommend she take her BP daily at home for the next week and come back for nurse visit BP check next week. She is a little anxious/overwhelmed today so I think that may be contributing.    Patient aware of signs/symptoms requiring further/urgent evaluation.  Follow-up if  symptom worsen or fail to improve. Recommend following up with PCP in the next month.   [employer] Olevia Bowens, DNP, FNP-C

## 2020-11-16 ENCOUNTER — Ambulatory Visit: Payer: Medicare HMO

## 2020-11-18 ENCOUNTER — Encounter: Payer: Self-pay | Admitting: Emergency Medicine

## 2020-11-18 ENCOUNTER — Ambulatory Visit
Admission: EM | Admit: 2020-11-18 | Discharge: 2020-11-18 | Disposition: A | Discharge status: Home / Self Care | Payer: Medicare HMO | Attending: Urgent Care | Admitting: Urgent Care

## 2020-11-18 ENCOUNTER — Other Ambulatory Visit: Payer: Self-pay

## 2020-11-18 ENCOUNTER — Ambulatory Visit (INDEPENDENT_AMBULATORY_CARE_PROVIDER_SITE_OTHER): Payer: Medicare HMO

## 2020-11-18 DIAGNOSIS — R059 Cough, unspecified: Secondary | ICD-10-CM | POA: Diagnosis not present

## 2020-11-18 DIAGNOSIS — J439 Emphysema, unspecified: Secondary | ICD-10-CM | POA: Diagnosis not present

## 2020-11-18 DIAGNOSIS — K449 Diaphragmatic hernia without obstruction or gangrene: Secondary | ICD-10-CM | POA: Diagnosis not present

## 2020-11-18 MED ORDER — BENZONATATE 100 MG PO CAPS
100.0000 mg | ORAL_CAPSULE | Freq: Three times a day (TID) | ORAL | 0 refills | Status: DC | PRN
Start: 2020-11-18 — End: 2020-12-07

## 2020-11-18 MED ORDER — PROMETHAZINE-DM 6.25-15 MG/5ML PO SYRP: 5.0000 mL | ORAL_SOLUTION | Freq: Every evening | ORAL | 0 refills | Status: DC | PRN

## 2020-11-18 NOTE — ED Provider Notes (Signed)
[place]   MRN: [id number] DOB: 04-May-1932  Subjective:   Beth [name] is a 87 y.o. female presenting for 1 month history of persistent cough that is eliciting lateral chest pain and upper back pain.  Patient has a history of COPD, emphysema.  She is followed by her doctor who has prescribed her an albuterol inhaler, Flovent.  Patient reports that she has not gotten much better and feels like this is aware she has been with respect to her lungs.  No current facility-administered medications for this encounter.  Current Outpatient Medications:    albuterol (VENTOLIN HFA) 108 (90 Base) MCG/ACT inhaler, Inhale 2 puffs into the lungs every 6 (six) hours as needed for wheezing or shortness of breath., Disp: 18 g, Rfl: prn   calcium carbonate 1250 MG capsule, Take 1,250 mg by mouth daily., Disp: , Rfl:    Cholecalciferol (VITAMIN D3) 1000 UNITS tablet, Take 1,000 Units by mouth daily., Disp: , Rfl:    diltiazem (CARDIZEM SR) 60 MG 12 hr capsule, Take 1 capsule by mouth twice daily, Disp: 60 capsule, Rfl: 5   fluticasone (FLOVENT HFA) 44 MCG/ACT inhaler, Inhale 2 puffs into the lungs in the morning and at bedtime., Disp: 1 each, Rfl: 12   furosemide (LASIX) 20 MG tablet, Take on Tuesday and Saturday, Disp: 35 tablet, Rfl: 2   ibuprofen (ADVIL,MOTRIN) 200 MG tablet, Take 200 mg by mouth every 6 (six) hours as needed., Disp: , Rfl:    Multiple Vitamins-Minerals (PRESERVISION AREDS 2+MULTI VIT) CAPS, Take by mouth. Preservision AREDS-2, Disp: , Rfl:    pantoprazole (PROTONIX) 40 MG tablet, Take 1 tablet by mouth once daily, Disp: 30 tablet, Rfl: 0   potassium chloride (KLOR-CON) 10 MEQ tablet, Take on Tuesday and Saturday, Disp: 35 tablet, Rfl: 2   Pyridoxine HCl (VITAMIN B-6) 500 MG tablet, Take 500 mg by mouth daily., Disp: , Rfl:    traZODone (DESYREL) 50 MG tablet, Take 1 tablet (50 mg total) by mouth at bedtime as needed for sleep., Disp: 30 tablet, Rfl:  1  Facility-Administered Medications Ordered in Other Encounters:    gadoxetate disodium (EOVIST) injection 6.5 mL, 6.5 mL, Intravenous, Once, Hali Marry, MD   Allergies  Allergen Reactions   Doxycycline Other (See Comments)    Blisters on mouth and lips   Lunesta [Eszopiclone] Other (See Comments)    Confused mentally   Pravachol [Pravastatin]     Fatigued.    Trazodone And Nefazodone Other (See Comments)    Made her hyper   Zocor [Simvastatin] Other (See Comments)    Myalgias and memory loss    Past Medical History:  Diagnosis Date   Allergy    Anxiety    situational   Arthritis    Asthma    Cataract    COPD (chronic obstructive pulmonary disease) (HCC)    PER PT   Diverticulitis    Diverticulitis    Emphysema of lung ([place])    Esophageal dysmotility    Fibromyalgia    Hiatal hernia    Hyperlipidemia    Hypertension    Insomnia    Osteopenia    UTI (lower urinary tract infection)      Past Surgical History:  Procedure Laterality Date   ABDOMINAL HYSTERECTOMY     BLADDER SURGERY     tac   COLONOSCOPY     2008    Family History  Problem Relation Age of Onset   Cancer Mother  breast   Hyperlipidemia Mother    Hypertension Mother    Dementia Mother    Heart disease Father        MI   Depression Father    Hyperlipidemia Father    Colon cancer Neg Hx    Esophageal cancer Neg Hx    Stomach cancer Neg Hx    Rectal cancer Neg Hx     Social History   Tobacco Use   Smoking status: Former   Smokeless tobacco: Never   Tobacco comments:    smoker as a teenager  Scientific laboratory technician Use: Never used  Substance Use Topics   Alcohol use: No    Alcohol/week: 0.0 standard drinks    Comment: occasionally   Drug use: No    ROS   Objective:   Vitals: BP 132/60 (BP Location: Left Arm)   Pulse 82   Temp 97.9 F (36.6 C) (Oral)   Resp 18   SpO2 96%   Physical Exam Vitals reviewed.  Constitutional:      General: She is not in  acute distress.    Appearance: Normal appearance. She is well-developed. She is not ill-appearing, toxic-appearing or diaphoretic.  HENT:     Head: Normocephalic and atraumatic.     Nose: Nose normal.     Mouth/Throat:     Mouth: Mucous membranes are moist.  Eyes:     Extraocular Movements: Extraocular movements intact.     Pupils: Pupils are equal, round, and reactive to light.  Cardiovascular:     Rate and Rhythm: Normal rate and regular rhythm.     Pulses: Normal pulses.     Heart sounds: Normal heart sounds. No murmur heard.   No friction rub. No gallop.  Pulmonary:     Effort: Pulmonary effort is normal. No respiratory distress.     Breath sounds: Normal breath sounds. No stridor. No wheezing, rhonchi or rales.  Skin:    General: Skin is warm and dry.     Findings: No rash.  Neurological:     Mental Status: She is alert and oriented to person, place, and time.  Psychiatric:        Mood and Affect: Mood normal.        Behavior: Behavior normal.        Thought Content: Thought content normal.        Judgment: Judgment normal.    DG Chest 2 View  Result Date: 11/18/2020 CLINICAL DATA:  1 month history of cough EXAM: CHEST - 2 VIEW COMPARISON:  November 05, 2020, March 16, 2011 FINDINGS: The cardiomediastinal silhouette is unchanged in contour.Retrocardiac opacity consistent with large hiatal hernia. No pleural effusion. No pneumothorax. Diffuse reticular opacities with flattening of the diaphragm consistent with underlying emphysema. Biapical scarring, similar comparison to prior. No new focal consolidation. Visualized abdomen is unremarkable. Multilevel degenerative changes of the thoracic spine. IMPRESSION: 1.  No acute cardiopulmonary abnormality. 2. Large hiatal hernia. 3. Given underlying emphysema, recommend evaluation for eligibility for lung cancer screening CT. Electronically Signed   By: Valentino Saxon MD   On: 11/18/2020 14:49     Assessment and Plan :   PDMP not  reviewed this encounter.  1. Pulmonary emphysema, unspecified emphysema type ([place])   2. Cough     Offered patient another prednisone course as its been a week since she has had one but patient refused.  Recommended supportive care and as there is no sign of an acute cardiopulmonary process such  as pneumonia or pleural effusion.  Continue regular inhaler medications.  Follow-up with PCP for consideration of a chest CT scan. Counseled patient on potential for adverse effects with medications prescribed/recommended today, ER and return-to-clinic precautions discussed, patient verbalized understanding.    Jaynee Eagles, [place] 11/18/20 1502

## 2020-11-18 NOTE — ED Triage Notes (Signed)
Pt here with cough x 1 month that she has been seen for; pt sts pain in lung area worse with cough; pt also sts she is unable to sleep x months; pt sts "her doctor changed her sleep meds"

## 2020-11-18 NOTE — Discharge Instructions (Addendum)
Please continue taking the inhalers that you are using as prescribed by your primary care provider.  There is no sign of pneumonia or an infection, fluid in your lungs and therefore we will hold off on recommending an ER visit for chest CT scan.  However due to your persistent cough would be important for you to check back with your PCP about getting an outpatient CT scan of your chest.

## 2020-11-19 ENCOUNTER — Ambulatory Visit: Payer: Medicare HMO

## 2020-12-06 ENCOUNTER — Other Ambulatory Visit: Payer: Self-pay | Admitting: Family Medicine

## 2020-12-06 DIAGNOSIS — F411 Generalized anxiety disorder: Secondary | ICD-10-CM

## 2020-12-07 ENCOUNTER — Other Ambulatory Visit: Payer: Self-pay | Admitting: *Deleted

## 2020-12-10 ENCOUNTER — Other Ambulatory Visit: Payer: Self-pay | Admitting: Family Medicine

## 2020-12-10 DIAGNOSIS — F411 Generalized anxiety disorder: Secondary | ICD-10-CM

## 2021-01-10 ENCOUNTER — Other Ambulatory Visit: Payer: Self-pay | Admitting: Family Medicine

## 2021-01-10 DIAGNOSIS — F411 Generalized anxiety disorder: Secondary | ICD-10-CM

## 2021-01-30 ENCOUNTER — Other Ambulatory Visit: Payer: Self-pay

## 2021-01-30 ENCOUNTER — Encounter: Payer: Self-pay | Admitting: Family Medicine

## 2021-01-30 ENCOUNTER — Ambulatory Visit (INDEPENDENT_AMBULATORY_CARE_PROVIDER_SITE_OTHER): Payer: Medicare HMO | Admitting: Family Medicine

## 2021-01-30 VITALS — BP 129/71 | HR 82 | Ht 64.0 in | Wt 153.0 lb

## 2021-01-30 DIAGNOSIS — N390 Urinary tract infection, site not specified: Secondary | ICD-10-CM | POA: Diagnosis not present

## 2021-01-30 DIAGNOSIS — R350 Frequency of micturition: Secondary | ICD-10-CM | POA: Diagnosis not present

## 2021-01-30 DIAGNOSIS — N3000 Acute cystitis without hematuria: Secondary | ICD-10-CM | POA: Diagnosis not present

## 2021-01-30 DIAGNOSIS — Z23 Encounter for immunization: Secondary | ICD-10-CM | POA: Diagnosis not present

## 2021-01-30 LAB — POCT URINALYSIS DIP (CLINITEK)
Bilirubin, UA: NEGATIVE
Glucose, UA: NEGATIVE mg/dL
Ketones, POC UA: NEGATIVE mg/dL
Nitrite, UA: POSITIVE — AB
Spec Grav, UA: 1.025 (ref 1.010–1.025)
Urobilinogen, UA: 0.2 E.U./dL
pH, UA: 5.5 (ref 5.0–8.0)

## 2021-01-30 MED ORDER — SULFAMETHOXAZOLE-TRIMETHOPRIM 400-80 MG PO TABS: 1.0000 | ORAL_TABLET | Freq: Every day | ORAL | 1 refills | Status: DC

## 2021-01-30 MED ORDER — CEPHALEXIN 500 MG PO CAPS: 500.0000 mg | ORAL_CAPSULE | Freq: Two times a day (BID) | ORAL | 0 refills | Status: DC

## 2021-01-30 NOTE — Progress Notes (Signed)
Acute Office Visit  Subjective:    Patient ID: Beth [name], female    DOB: 18-May-1932, 87 y.o.   MRN: [id number]  Chief Complaint  Patient presents with   Urinary Tract Infection    HPI Patient is in today for          She stated that her sxs started last week. She has frequency,odor,and cloudy urine.   Denies any f/s/c/n/v/d.     NO hematuria.   UTIS in the past year: Dec, May, June and now.  She does have a urologist that she follows with but says has difficulty getting in with him.  She says at one point they talked about prophylaxis but never actually prescribed it.   Past Medical History:  Diagnosis Date   Allergy    Anxiety    situational   Arthritis    Asthma    Cataract    COPD (chronic obstructive pulmonary disease) (HCC)    PER PT   Diverticulitis    Diverticulitis    Emphysema of lung ([place])    Esophageal dysmotility    Fibromyalgia    Hiatal hernia    Hyperlipidemia    Hypertension    Insomnia    Osteopenia    UTI (lower urinary tract infection)     Past Surgical History:  Procedure Laterality Date   ABDOMINAL HYSTERECTOMY     BLADDER SURGERY     tac   COLONOSCOPY     2008    Family History  Problem Relation Age of Onset   Cancer Mother        breast   Hyperlipidemia Mother    Hypertension Mother    Dementia Mother    Heart disease Father        MI   Depression Father    Hyperlipidemia Father    Colon cancer Neg Hx    Esophageal cancer Neg Hx    Stomach cancer Neg Hx    Rectal cancer Neg Hx     Social History   Socioeconomic History   Marital status: Widowed    Spouse name: Not on file   Number of children: 2   Years of education: 12th   Highest education level: High school graduate  Occupational History   Occupation: retired  Tobacco Use   Smoking status: Former   Smokeless tobacco: Never   Tobacco comments:    smoker as a teenager  Scientific laboratory technician Use: Never used  Substance and Sexual Activity   Alcohol  use: No    Alcohol/week: 0.0 standard drinks    Comment: occasionally   Drug use: No   Sexual activity: Not on file  Other Topics Concern   Not on file  Social History Narrative   Lives alone. She lost her son a year ago and had to put her dog to sleep in December. Her daughter lives 30  mins away. Likes to work on puzzles, word searches and read about history in her free time. Likes to do family research.   Social Determinants of Health   Financial Resource Strain: Low Risk    Difficulty of Paying Living Expenses: Not hard at all  Food Insecurity: No Food Insecurity   Worried About Charity fundraiser in the Last Year: Never true   [place] in the Last Year: Never true  Transportation Needs: No Transportation Needs   Lack of Transportation (Medical): No   Lack of Transportation (Non-Medical): No  Physical Activity: Inactive   Days of Exercise per Week: 0 days   Minutes of Exercise per Session: 0 min  Stress: No Stress Concern Present   Feeling of Stress : Not at all  Social Connections: Socially Isolated   Frequency of Communication with Friends and Family: More than three times a week   Frequency of Social Gatherings with Friends and Family: Twice a week   Attends Religious Services: Never   Marine scientist or Organizations: No   Attends Archivist Meetings: Never   Marital Status: Widowed  Human resources officer Violence: Not At Risk   Fear of Current or Ex-Partner: No   Emotionally Abused: No   Physically Abused: No   Sexually Abused: No    Outpatient Medications Prior to Visit  Medication Sig Dispense Refill   albuterol (VENTOLIN HFA) 108 (90 Base) MCG/ACT inhaler Inhale 2 puffs into the lungs every 6 (six) hours as needed for wheezing or shortness of breath. 18 g prn   calcium carbonate 1250 MG capsule Take 1,250 mg by mouth daily.     Cholecalciferol (VITAMIN D3) 1000 UNITS tablet Take 1,000 Units by mouth daily.     diltiazem (CARDIZEM SR) 60 MG  12 hr capsule Take 1 capsule by mouth twice daily 60 capsule 5   fluticasone (FLOVENT HFA) 44 MCG/ACT inhaler Inhale 2 puffs into the lungs in the morning and at bedtime. 1 each 12   furosemide (LASIX) 20 MG tablet Take on Tuesday and Saturday 35 tablet 2   ibuprofen (ADVIL,MOTRIN) 200 MG tablet Take 200 mg by mouth every 6 (six) hours as needed.     Multiple Vitamins-Minerals (PRESERVISION AREDS 2+MULTI VIT) CAPS Take by mouth. Preservision AREDS-2     pantoprazole (PROTONIX) 40 MG tablet Take 1 tablet by mouth once daily 30 tablet 0   potassium chloride (KLOR-CON) 10 MEQ tablet Take on Tuesday and Saturday 35 tablet 2   Pyridoxine HCl (VITAMIN B-6) 500 MG tablet Take 500 mg by mouth daily.     sertraline (ZOLOFT) 50 MG tablet Take 1 tablet by mouth once daily 90 tablet 0   traZODone (DESYREL) 50 MG tablet Take 1 tablet (50 mg total) by mouth at bedtime as needed for sleep. 30 tablet 1   omeprazole (PRILOSEC) 40 MG capsule Take 1 capsule (40 mg total) by mouth 2 (two) times daily at 10 AM and 5 PM. 90 capsule 3   Facility-Administered Medications Prior to Visit  Medication Dose Route Frequency Provider Last Rate Last Admin   gadoxetate disodium (EOVIST) injection 6.5 mL  6.5 mL Intravenous Once Hali Marry, MD        Allergies  Allergen Reactions   Doxycycline Other (See Comments)    Blisters on mouth and lips   Lunesta [Eszopiclone] Other (See Comments)    Confused mentally   Pravachol [Pravastatin]     Fatigued.    Trazodone And Nefazodone Other (See Comments)    Made her hyper   Zocor [Simvastatin] Other (See Comments)    Myalgias and memory loss    Review of Systems     Objective:    Physical Exam  BP 129/71   Pulse 82   Ht 5\' 4"  (1.626 m)   Wt 153 lb (69.4 kg)   SpO2 94%   BMI 26.26 kg/m  Wt Readings from Last 3 Encounters:  01/30/21 153 lb (69.4 kg)  10/30/20 151 lb 12.8 oz (68.9 kg)  10/11/20 151 lb (68.5 kg)  Health Maintenance Due  Topic Date  Due   Zoster Vaccines- Shingrix (1 of 2) Never done   COVID-19 Vaccine (4 - Booster for Pfizer series) 06/20/2020    There are no preventive care reminders to display for this patient.   Lab Results  Component Value Date   TSH 3.59 09/25/2020   Lab Results  Component Value Date   WBC 6.0 09/25/2020   HGB 13.7 09/25/2020   HCT 41.7 09/25/2020   MCV 93.1 09/25/2020   PLT 215 09/25/2020   Lab Results  Component Value Date   NA 137 09/25/2020   K 4.4 09/25/2020   CO2 30 09/25/2020   GLUCOSE 85 09/25/2020   BUN 16 09/25/2020   CREATININE 0.74 09/25/2020   BILITOT 0.4 09/25/2020   ALKPHOS 57 07/25/2016   AST 14 09/25/2020   ALT 10 09/25/2020   PROT 7.1 09/25/2020   ALBUMIN 4.2 07/25/2016   CALCIUM 10.3 09/25/2020   ANIONGAP 8 07/25/2016   Lab Results  Component Value Date   CHOL 216 (H) 02/24/2020   Lab Results  Component Value Date   HDL 47 (L) 02/24/2020   Lab Results  Component Value Date   LDLCALC 137 (H) 02/24/2020   Lab Results  Component Value Date   TRIG 180 (H) 02/24/2020   Lab Results  Component Value Date   CHOLHDL 4.6 02/24/2020   No results found for: HGBA1C     Assessment & Plan:   Problem List Items Addressed This Visit   None Visit Diagnoses     Urinary frequency    -  Primary   Relevant Orders   POCT URINALYSIS DIP (CLINITEK) (Completed)   Need for immunization against influenza       Relevant Orders   Flu Vaccine QUAD High Dose(Fluad) (Completed)   Acute cystitis without hematuria       Relevant Medications   cephALEXin (KEFLEX) 500 MG capsule   Recurrent UTI       Relevant Medications   cephALEXin (KEFLEX) 500 MG capsule   sulfamethoxazole-trimethoprim (BACTRIM) 400-80 MG tablet      Current urinary tract infection-urinalysis is indicative of infection o we will go ahead and treat with Keflex.  If symptoms or not completely resolved then please let us know.  Recurrent UTI-discussed prophylactic options with antibiotics  and or topical estrogen she says she thinks she tried the estrogen at 1 point she would prefer to maybe take a pill.  We discussed expectations.  Prescription sent for low-dose Bactrim.  Meds ordered this encounter  Medications   cephALEXin (KEFLEX) 500 MG capsule    Sig: Take 1 capsule (500 mg total) by mouth 2 (two) times daily.    Dispense:  6 capsule    Refill:  0   sulfamethoxazole-trimethoprim (BACTRIM) 400-80 MG tablet    Sig: Take 1 tablet by mouth daily.    Dispense:  90 tablet    Refill:  1     Beatrice Lecher, MD

## 2021-01-30 NOTE — Progress Notes (Signed)
She stated that her sxs started last week. She has frequency,odor,and cloudy urine.  Denies any f/s/c/n/v/d.

## 2021-02-05 ENCOUNTER — Telehealth: Payer: Self-pay | Admitting: *Deleted

## 2021-02-05 ENCOUNTER — Telehealth: Payer: Self-pay

## 2021-02-05 NOTE — Telephone Encounter (Signed)
If she has finished the Keflex and is not better then we can order udinre culture to be done at lab and needs to increase fluids to 60 oz total a day.

## 2021-02-05 NOTE — Telephone Encounter (Signed)
Beth [name] called and states she was advised to call back once she finished the first antibiotic if not better. She reports low urine output, back pain, strong urine odor and dark color of urine. I did advise she may have low urine output, odor and dark color urine if she is not hydrating. She did state she sleeps a lot and probably doesn't drink enough. She wants to know if she should start the Bactrim or get another round of the other antibiotic.

## 2021-02-05 NOTE — Telephone Encounter (Signed)
There are 2 notes open about this. She needs a urine culture.she also needs to inc fluids to 60 oz a day

## 2021-02-05 NOTE — Telephone Encounter (Signed)
Pt called and stated that she was told to call if her sxs weren't getting better on the medication.    She states that the sxs are not getting better and would like something else sent to the pharmacy.   Will send to pcp for review.Marland Kitchen

## 2021-02-06 NOTE — Telephone Encounter (Signed)
See other message

## 2021-02-06 NOTE — Telephone Encounter (Signed)
Patient advised. She is feeling better today.

## 2021-02-25 ENCOUNTER — Other Ambulatory Visit: Payer: Self-pay | Admitting: Family Medicine

## 2021-04-08 ENCOUNTER — Other Ambulatory Visit: Payer: Self-pay | Admitting: Family Medicine

## 2021-04-08 DIAGNOSIS — R252 Cramp and spasm: Secondary | ICD-10-CM

## 2021-04-16 ENCOUNTER — Ambulatory Visit: Payer: Medicare HMO | Admitting: Physician Assistant

## 2021-06-25 ENCOUNTER — Encounter: Payer: Self-pay | Admitting: Emergency Medicine

## 2021-06-25 ENCOUNTER — Ambulatory Visit
Admission: EM | Admit: 2021-06-25 | Discharge: 2021-06-25 | Disposition: A | Discharge status: Home / Self Care | Payer: Medicare HMO | Attending: Physician Assistant | Admitting: Physician Assistant

## 2021-06-25 ENCOUNTER — Other Ambulatory Visit: Payer: Self-pay

## 2021-06-25 DIAGNOSIS — G4700 Insomnia, unspecified: Secondary | ICD-10-CM

## 2021-06-25 LAB — POCT URINALYSIS DIP (MANUAL ENTRY)
Bilirubin, UA: NEGATIVE
Glucose, UA: NEGATIVE mg/dL
Ketones, POC UA: NEGATIVE mg/dL
Leukocytes, UA: NEGATIVE
Nitrite, UA: NEGATIVE
Protein Ur, POC: NEGATIVE mg/dL
Spec Grav, UA: 1.015 (ref 1.010–1.025)
Urobilinogen, UA: 0.2 E.U./dL
pH, UA: 5.5 (ref 5.0–8.0)

## 2021-06-25 NOTE — ED Provider Notes (Signed)
[name]    CSN: [id number] Arrival date & time: 06/25/21  1443      History   Chief Complaint Chief Complaint  Patient presents with   Dysuria    HPI Beth [name] is a 88 y.o. female.   Patient here today for evaluation of dysuria and urinary urgency.  She would like screening for UTI as she has history of recurrent UTIs.  She denies fever.  She does report that she has felt somewhat jittery, but admits to chronic insomnia that has seemed to affect her more recently.  She does also seem somewhat anxious.    The history is provided by the patient.  Dysuria Associated symptoms: no fever    Past Medical History:  Diagnosis Date   Allergy    Anxiety    situational   Arthritis    Asthma    Cataract    COPD (chronic obstructive pulmonary disease) (HCC)    PER PT   Diverticulitis    Diverticulitis    Emphysema of lung ([place])    Esophageal dysmotility    Fibromyalgia    Hiatal hernia    Hyperlipidemia    Hypertension    Insomnia    Osteopenia    UTI (lower urinary tract infection)     Patient Active Problem List   Diagnosis Date Noted   Chest tightness 10/30/2020   Dyspnea on exertion 10/30/2020   Mixed hyperlipidemia 10/30/2020   Allergy 10/26/2020   Anxiety 10/26/2020   Arthritis 10/26/2020   Asthma 10/26/2020   Cataract 10/26/2020   Diverticulitis 10/26/2020   Esophageal dysmotility 10/26/2020   Fibromyalgia 10/26/2020   Osteopenia 10/26/2020   Adjustment insomnia 10/11/2020   Poor sleep pattern 10/11/2020   Ocular proptosis 01/31/2020   Bilateral cold feet 01/31/2020   Grief 07/19/2019   Multiple closed fractures of ribs of left side 05/12/2019   Fall 05/12/2019   Muscle cramps 01/27/2019   Reactive airways dysfunction syndrome ([place]) 03/30/2018   Memory impairment 02/04/2018   Sciatica, left side 02/04/2018   Recurrent UTI (urinary tract infection) 07/14/2017   Cardiomegaly 11/06/2015   History of recurrent UTIs 05/29/2015    COPD, mild ([place]) 10/23/2014   Laryngopharyngeal reflux (LPR) 10/23/2014   Headache(784.0) 01/17/2014   Generalized anxiety disorder 01/16/2014   Insomnia 01/16/2014   Lactose intolerance 09/06/2013   Ataxia 03/16/2011   HYPERLIPIDEMIA 11/13/2010   Reaction, situational, acute, to stress 09/09/2010   MURMUR 05/11/2009   URINARY URGENCY 05/11/2009   Diaphragmatic hernia 03/14/2009   MACULAR DEGENERATION, RIGHT EYE 12/06/2008   HYPERTENSION, BENIGN 12/06/2008   DIVERTICULITIS, COLON 12/06/2008   FIBROMYALGIA 12/06/2008   OSTEOPENIA 12/06/2008    Past Surgical History:  Procedure Laterality Date   ABDOMINAL HYSTERECTOMY     BLADDER SURGERY     tac   COLONOSCOPY     2008    OB History   No obstetric history on file.      Home Medications    Prior to Admission medications   Medication Sig Start Date End Date Taking? Authorizing Provider  albuterol (VENTOLIN HFA) 108 (90 Base) MCG/ACT inhaler Inhale 2 puffs into the lungs every 6 (six) hours as needed for wheezing or shortness of breath. 11/05/20   Terrilyn Saver, NP  calcium carbonate 1250 MG capsule Take 1,250 mg by mouth daily.    [provider]  cephALEXin (KEFLEX) 500 MG capsule Take 1 capsule (500 mg total) by mouth 2 (two) times daily. Patient not taking: Reported  on 06/25/2021 01/30/21   Hali Marry, MD  Cholecalciferol (VITAMIN D3) 1000 UNITS tablet Take 1,000 Units by mouth daily.    [provider]  diltiazem (CARDIZEM SR) 60 MG 12 hr capsule Take 1 capsule by mouth twice daily 04/08/21   Hali Marry, MD  fluticasone (FLOVENT HFA) 44 MCG/ACT inhaler Inhale 2 puffs into the lungs in the morning and at bedtime. 11/05/20   Terrilyn Saver, NP  furosemide (LASIX) 20 MG tablet Take on Tuesday and Saturday 10/30/20   Tobb, Kardie, DO  ibuprofen (ADVIL,MOTRIN) 200 MG tablet Take 200 mg by mouth every 6 (six) hours as needed.    [provider]  Multiple Vitamins-Minerals  (PRESERVISION AREDS 2+MULTI VIT) CAPS Take by mouth. Preservision AREDS-2    [provider]  pantoprazole (PROTONIX) 40 MG tablet Take 1 tablet (40 mg total) by mouth daily. Needs appointment. 02/26/21   Hali Marry, MD  potassium chloride (KLOR-CON) 10 MEQ tablet Take on Tuesday and Saturday 10/30/20   Tobb, Kardie, DO  Pyridoxine HCl (VITAMIN B-6) 500 MG tablet Take 500 mg by mouth daily.    [provider]  sertraline (ZOLOFT) 50 MG tablet Take 1 tablet by mouth once daily 01/10/21   Hali Marry, MD  sulfamethoxazole-trimethoprim (BACTRIM) 400-80 MG tablet Take 1 tablet by mouth daily. Patient not taking: Reported on 06/25/2021 01/30/21   Hali Marry, MD  traZODone (DESYREL) 50 MG tablet Take 1 tablet (50 mg total) by mouth at bedtime as needed for sleep. 10/11/20   Dohmeier, Asencion Partridge, MD    Family History Family History  Problem Relation Age of Onset   Cancer Mother        breast   Hyperlipidemia Mother    Hypertension Mother    Dementia Mother    Heart disease Father        MI   Depression Father    Hyperlipidemia Father    Colon cancer Neg Hx    Esophageal cancer Neg Hx    Stomach cancer Neg Hx    Rectal cancer Neg Hx     Social History Social History   Tobacco Use   Smoking status: Former   Smokeless tobacco: Never   Tobacco comments:    smoker as a teenager  Scientific laboratory technician Use: Never used  Substance Use Topics   Alcohol use: No    Alcohol/week: 0.0 standard drinks    Comment: occasionally   Drug use: No     Allergies   Doxycycline, Lunesta [eszopiclone], Pravachol [pravastatin], Trazodone and nefazodone, and Zocor [simvastatin]   Review of Systems Review of Systems  Constitutional:  Negative for chills and fever.  Eyes:  Negative for discharge and redness.  Genitourinary:  Positive for dysuria and urgency.  Psychiatric/Behavioral:  Positive for sleep disturbance. The patient is nervous/anxious.     Physical  Exam Triage Vital Signs ED Triage Vitals [06/25/21 1459]  Enc Vitals Group     BP (!) 187/89     Pulse Rate 91     Resp 18     Temp 97.8 F (36.6 C)     Temp Source Oral     SpO2 95 %     Weight      Height      Head Circumference      Peak Flow      Pain Score 0     Pain Loc      Pain Edu?  Excl. in [place]?    No data found.  Updated Vital Signs BP (!) 165/92 (BP Location: Left Arm)    Pulse 91    Temp 97.8 F (36.6 C) (Oral)    Resp 18    SpO2 95%      Physical Exam Vitals and nursing note reviewed.  Constitutional:      General: She is not in acute distress.    Appearance: Normal appearance. She is not ill-appearing.  HENT:     Head: Normocephalic and atraumatic.  Eyes:     Conjunctiva/sclera: Conjunctivae normal.  Cardiovascular:     Rate and Rhythm: Normal rate.     Heart sounds: Normal heart sounds. No murmur heard.    Comments: Occasional ectopy Pulmonary:     Effort: Pulmonary effort is normal. No respiratory distress.     Breath sounds: Normal breath sounds. No wheezing, rhonchi or rales.  Neurological:     Mental Status: She is alert.  Psychiatric:     Comments: Appears mildly anxious     UC Treatments / Results  Labs (all labs ordered are listed, but only abnormal results are displayed) Labs Reviewed  POCT URINALYSIS DIP (MANUAL ENTRY) - Abnormal; Notable for the following components:      Result Value   Color, UA light yellow (*)    Blood, UA trace-intact (*)    All other components within normal limits    EKG   Radiology No results found.  Procedures Procedures (including critical care time)  Medications Ordered in UC Medications - No data to display  Initial Impression / Assessment and Plan / UC Course  I have reviewed the triage vital signs and the nursing notes.  Pertinent labs & imaging results that were available during my care of the patient were reviewed by me and considered in my medical decision making (see chart for  details).   UA without signs of UTI. Patient was to have appointment with PCP tomorrow but states she is cancelling it. I discussed option for evaluation by urgent care for behavioral health to discuss insomnia and anxiety treatment options as this is beyond scope of this practice. Patient expresses understanding.    Final Clinical Impressions(s) / UC Diagnoses   Final diagnoses:  Insomnia, unspecified type     Discharge Instructions       [facility]  [place], [place]   [phone]       ED Prescriptions   None    PDMP not reviewed this encounter.   Francene Finders, PA-C 06/25/21 [phone]

## 2021-06-25 NOTE — ED Triage Notes (Signed)
Pt sts increased dysuria at times with urgency; pt sts feeling "jittery and unable to go to sleep"; pt appears anxious; pt is [facility]

## 2021-06-25 NOTE — Discharge Instructions (Signed)
°  [facility]  [address] [place], [place]   [phone]

## 2021-06-26 ENCOUNTER — Ambulatory Visit: Payer: Medicare HMO | Admitting: Physician Assistant

## 2021-07-01 ENCOUNTER — Other Ambulatory Visit: Payer: Self-pay | Admitting: Family Medicine

## 2021-07-01 DIAGNOSIS — F411 Generalized anxiety disorder: Secondary | ICD-10-CM

## 2021-07-22 ENCOUNTER — Ambulatory Visit (INDEPENDENT_AMBULATORY_CARE_PROVIDER_SITE_OTHER): Payer: Medicare HMO | Admitting: Family Medicine

## 2021-07-22 DIAGNOSIS — Z0000 Encounter for general adult medical examination without abnormal findings: Secondary | ICD-10-CM

## 2021-07-22 NOTE — Patient Instructions (Addendum)
?MEDICARE ANNUAL WELLNESS VISIT ?Health Maintenance Summary and Written Plan of Care ? ?Beth [name] , ? ?Thank you for allowing me to perform your Medicare Annual Wellness Visit and for your ongoing commitment to your health.  ? ?Health Maintenance & Immunization History ?Health Maintenance  ?Topic Date Due  ?? COVID-19 Vaccine (4 - Booster for Pfizer series) 08/07/2021 (Originally 04/14/2020)  ?? Zoster Vaccines- Shingrix (1 of 2) 10/22/2021 (Originally 05/23/1984)  ?? TETANUS/TDAP  07/23/2022 (Originally 02/17/2021)  ?? Pneumonia Vaccine 27+ Years old  Completed  ?? INFLUENZA VACCINE  Completed  ?? DEXA SCAN  Completed  ?? HPV VACCINES  Aged Out  ? ?Immunization History  ?Administered Date(s) Administered  ?? Fluad Quad(high Dose 65+) 01/27/2019, 01/31/2020, 01/30/2021  ?? Influenza Split 02/18/2012  ?? Influenza Whole 05/04/2009  ?? Influenza, High Dose Seasonal PF 02/04/2017, 02/04/2018  ?? Influenza,inj,Quad PF,6+ Mos 02/25/2013, 05/30/2015, 12/27/2015  ?? Influenza-Unspecified 02/21/2014  ?? PFIZER(Purple Top)SARS-COV-2 Vaccination 06/24/2019, 07/20/2019, 02/18/2020  ?? Pneumococcal Conjugate-13 07/17/2016  ?? Pneumococcal Polysaccharide-23 07/03/2004  ?? Tdap 02/18/2011  ?? Zoster, Live 02/21/2013  ? ? ?These are the patient goals that we discussed: ? Goals Addressed   ?  ?  ?  ?  ?  ? This Visit's Progress  ? ?  Patient Stated (pt-stated)     ?   07/22/2021 ?AWV Goal: Improved Nutrition/Diet ? ?Patient will verbalize understanding that diet plays an important role in overall health and that a poor diet is a risk factor for many chronic medical conditions.  ?Over the next year, patient will improve self management of their diet by incorporating more water. ?Patient will utilize available community resources to help with food acquisition if needed (ex: food pantries, [address], etc) ?Patient will work with nutrition specialist if a referral was made  ?  ?  ?  ? ?This is a list of Health Maintenance Items that are  overdue or due now: ?Td vaccine ?Shingrix vaccine ?Bone density screening ? ?Orders/Referrals Placed Today: ?No orders of the defined types were placed in this encounter. ? ?(Contact our referral department at [phone] if you have not spoken with someone about your referral appointment within the next 5 days)  ? ? ?Follow-up Plan ?Follow-up with Hali Marry, MD as planned ?Schedule your tetanus shot and shingles shot at your pharmacy. ?Let us know if you change your mind about the bone density scan. ?Medicare wellness in one year.  ?AVS printed and mailed to the patient. ? ? ? ?  ?Bone Density Test ?A bone density test uses a type of X-ray to measure the amount of calcium and other minerals in a person's bones. It can measure bone density in the hip and the spine. The test is similar to having a regular X-ray. This test may also be called: ?Bone densitometry. ?Bone mineral density test. ?Dual-energy X-ray absorptiometry (DEXA). ?You may have this test to: ?Diagnose a condition that causes weak or thin bones (osteoporosis). ?Screen you for osteoporosis. ?Predict your risk for a broken bone (fracture). ?Determine how well your osteoporosis treatment is working. ?Tell a health care provider about: ?Any allergies you have. ?All medicines you are taking, including vitamins, herbs, eye drops, creams, and over-the-counter medicines. ?Any problems you or family members have had with anesthetic medicines. ?Any blood disorders you have. ?Any surgeries you have had. ?Any medical conditions you have. ?Whether you are pregnant or may be pregnant. ?Any medical tests you have had within the past 14 days that used contrast material. ?  What are the risks? ?Generally, this is a safe test. However, it does expose you to a small amount of radiation, which can slightly increase your cancer risk. ?What happens before the test? ?Do not take any calcium supplements within the 24 hours before your test. ?You will need to remove  all metal jewelry, eyeglasses, removable dental appliances, and any other metal objects on your body. ?What happens during the test? ? ?You will lie down on an exam table. There will be an X-ray generator below you and an imaging device above you. ?Other devices, such as boxes or braces, may be used to position your body properly for the scan. ?The machine will slowly scan your body. You will need to keep very still while the machine does the scan. ?The images will show up on a screen in the room. Images will be examined by a specialist after your test is finished. ?The procedure may vary among health care providers and hospitals. ?What can I expect after the test? ?It is up to you to get the results of your test. Ask your health care provider, or the department that is doing the test, when your results will be ready. ?Summary ?A bone density test is an imaging test that uses a type of X-ray to measure the amount of calcium and other minerals in your bones. ?The test may be used to diagnose or screen you for a condition that causes weak or thin bones (osteoporosis), predict your risk for a broken bone (fracture), or determine how well your osteoporosis treatment is working. ?Do not take any calcium supplements within 24 hours before your test. ?Ask your health care provider, or the department that is doing the test, when your results will be ready. ?This information is not intended to replace advice given to you by your health care provider. Make sure you discuss any questions you have with your health care provider. ?Document Revised: 12/26/2020 Document Reviewed: 09/29/2019 ?Elsevier Patient Education ? [employer]. ? ?Health Maintenance, Female ?Adopting a healthy lifestyle and getting preventive care are important in promoting health and wellness. Ask your health care provider about: ?The right schedule for you to have regular tests and exams. ?Things you can do on your own to prevent diseases and keep  yourself healthy. ?What should I know about diet, weight, and exercise? ?Eat a healthy diet ? ?Eat a diet that includes plenty of vegetables, fruits, low-fat dairy products, and lean protein. ?Do not eat a lot of foods that are high in solid fats, added sugars, or sodium. ?Maintain a healthy weight ?Body mass index (BMI) is used to identify weight problems. It estimates body fat based on height and weight. Your health care provider can help determine your BMI and help you achieve or maintain a healthy weight. ?Get regular exercise ?Get regular exercise. This is one of the most important things you can do for your health. Most adults should: ?Exercise for at least 150 minutes each week. The exercise should increase your heart rate and make you sweat (moderate-intensity exercise). ?Do strengthening exercises at least twice a week. This is in addition to the moderate-intensity exercise. ?Spend less time sitting. Even light physical activity can be beneficial. ?Watch cholesterol and blood lipids ?Have your blood tested for lipids and cholesterol at 86 years of age, then have this test every 5 years. ?Have your cholesterol levels checked more often if: ?Your lipid or cholesterol levels are high. ?You are older than 86 years of age. ?You are  at high risk for heart disease. ?What should I know about cancer screening? ?Depending on your health history and family history, you may need to have cancer screening at various ages. This may include screening for: ?Breast cancer. ?Cervical cancer. ?Colorectal cancer. ?Skin cancer. ?Lung cancer. ?What should I know about heart disease, diabetes, and high blood pressure? ?Blood pressure and heart disease ?High blood pressure causes heart disease and increases the risk of stroke. This is more likely to develop in people who have high blood pressure readings or are overweight. ?Have your blood pressure checked: ?Every 3-5 years if you are 35-22 years of age. ?Every year if you are 10  years old or older. ?Diabetes ?Have regular diabetes screenings. This checks your fasting blood sugar level. Have the screening done: ?Once every three years after age 46 if you are at a normal weight an

## 2021-07-22 NOTE — Progress Notes (Signed)
? ? ?MEDICARE ANNUAL WELLNESS VISIT ? ?07/22/2021 ? ?Telephone Visit Disclaimer ?This Medicare AWV was conducted by telephone due to national recommendations for restrictions regarding the COVID-19 Pandemic (e.g. social distancing).  I verified, using two identifiers, that I am speaking with Beth [name] or their authorized healthcare agent. I discussed the limitations, risks, security, and privacy concerns of performing an evaluation and management service by telephone and the potential availability of an in-person appointment in the future. The patient expressed understanding and agreed to proceed.  ?Location of Patient: Home ?Location of Provider (nurse):  In the office. ? ?Subjective:  ? ? ?Beth [name] is a 88 y.o. female patient of Metheney, Rene Kocher, MD who had a Medicare Annual Wellness Visit today via telephone. [name] is Retired and lives alone. she has 2 children but one has deceased. she reports that she is socially active and does interact with friends/family regularly. she is minimally physically active and enjoys work on puzzles, word searches and read about history in her free time. ? ?Patient Care Team: ?Hali Marry, MD as PCP - General ?Franchot Gallo, MD as Consulting Physician (Urology) ?[facility] as Referring Physician ? ? ?  07/22/2021  ? 10:18 AM 07/13/2020  ?  1:06 PM 09/28/2016  ?  4:42 PM 07/25/2016  ?  3:16 PM 08/02/2015  ?  2:42 PM 01/16/2014  ?  6:22 AM 01/15/2014  ? 11:48 PM  ?Advanced Directives  ?Does Patient Have a Medical Advance Directive? Yes Yes No No Yes Yes No  ?Type of Advance Directive Living will [place];Living will   Healthcare Power of Attorney Living will   ?Does patient want to make changes to medical advance directive? No - Patient declined No - Patient declined    No - Patient declined   ?Copy of [place] in Chart?  No - copy requested       ?Would patient like information on creating a medical  advance directive?      No - patient declined information No - patient declined information  ? ? ?Hospital Utilization Over the Past 12 Months: ?# of hospitalizations or ER visits: 0 ?# of surgeries: 0 ? ?Review of Systems    ?Patient reports that her overall health is unchanged compared to last year. ? ?History obtained from chart review and the patient ? ?Patient Reported Readings (BP, Pulse, CBG, Weight, etc) ?none ? ?Pain Assessment ?Pain : No/denies pain ? ?  ? ?Current Medications & Allergies (verified) ?Allergies as of 07/22/2021   ? ?   Reactions  ? Doxycycline Other (See Comments)  ? Blisters on mouth and lips  ? Lunesta [eszopiclone] Other (See Comments)  ? Confused mentally  ? Pravachol [pravastatin]   ? Fatigued.   ? Trazodone And Nefazodone Other (See Comments)  ? Made her hyper  ? Zocor [simvastatin] Other (See Comments)  ? Myalgias and memory loss  ? ?  ? ?  ?Medication List  ?  ? ?  ? Accurate as of July 22, 2021 10:40 AM. If you have any questions, ask your nurse or doctor.  ?  ?  ? ?  ? ?albuterol 108 (90 Base) MCG/ACT inhaler ?Commonly known as: VENTOLIN HFA ?Inhale 2 puffs into the lungs every 6 (six) hours as needed for wheezing or shortness of breath. ?  ?calcium carbonate 1250 MG capsule ?Take 1,250 mg by mouth daily. ?  ?cephALEXin 500 MG capsule ?Commonly known as: KEFLEX ?Take  1 capsule (500 mg total) by mouth 2 (two) times daily. ?  ?cholecalciferol 25 MCG (1000 UNIT) tablet ?Commonly known as: VITAMIN D ?Take 1,000 Units by mouth daily. ?  ?diltiazem 60 MG 12 hr capsule ?Commonly known as: CARDIZEM SR ?Take 1 capsule by mouth twice daily ?  ?fluticasone 44 MCG/ACT inhaler ?Commonly known as: Flovent HFA ?Inhale 2 puffs into the lungs in the morning and at bedtime. ?  ?furosemide 20 MG tablet ?Commonly known as: LASIX ?Take on Tuesday and Saturday ?  ?ibuprofen 200 MG tablet ?Commonly known as: ADVIL ?Take 200 mg by mouth every 6 (six) hours as needed. ?  ?pantoprazole 40 MG  tablet ?Commonly known as: PROTONIX ?Take 1 tablet (40 mg total) by mouth daily. Needs appointment. ?  ?potassium chloride 10 MEQ tablet ?Commonly known as: KLOR-CON ?Take on Tuesday and Saturday ?  ?PreserVision AREDS 2+Multi Vit Caps ?Take by mouth. Preservision AREDS-2 ?  ?sertraline 50 MG tablet ?Commonly known as: ZOLOFT ?Take 1 tablet by mouth once daily ?  ?sulfamethoxazole-trimethoprim 400-80 MG tablet ?Commonly known as: Bactrim ?Take 1 tablet by mouth daily. ?  ?traZODone 50 MG tablet ?Commonly known as: DESYREL ?Take 1 tablet (50 mg total) by mouth at bedtime as needed for sleep. ?  ?vitamin B-6 500 MG tablet ?Take 500 mg by mouth daily. ?  ? ?  ? ? ?History (reviewed): ?Past Medical History:  ?Diagnosis Date  ? Allergy   ? Anxiety   ? situational  ? Arthritis   ? Asthma   ? Cataract   ? COPD (chronic obstructive pulmonary disease) ([place])   ? PER PT  ? Diverticulitis   ? Diverticulitis   ? Emphysema of lung ([place])   ? Esophageal dysmotility   ? Fibromyalgia   ? Hiatal hernia   ? Hyperlipidemia   ? Hypertension   ? Insomnia   ? Osteopenia   ? UTI (lower urinary tract infection)   ? ?Past Surgical History:  ?Procedure Laterality Date  ? ABDOMINAL HYSTERECTOMY    ? BLADDER SURGERY    ? tac  ? COLONOSCOPY    ? 2008  ? ?Family History  ?Problem Relation Age of Onset  ? Cancer Mother   ?     breast  ? Hyperlipidemia Mother   ? Hypertension Mother   ? Dementia Mother   ? Heart disease Father   ?     MI  ? Depression Father   ? Hyperlipidemia Father   ? Colon cancer Neg Hx   ? Esophageal cancer Neg Hx   ? Stomach cancer Neg Hx   ? Rectal cancer Neg Hx   ? ?Social History  ? ?Socioeconomic History  ? Marital status: Widowed  ?  Spouse name: Not on file  ? Number of children: 2  ? Years of education: 12th  ? Highest education level: High school graduate  ?Occupational History  ? Occupation: retired  ?Tobacco Use  ? Smoking status: Former  ? Smokeless tobacco: Never  ? Tobacco comments:  ?  smoker as a teenager   ?Vaping Use  ? Vaping Use: Never used  ?Substance and Sexual Activity  ? Alcohol use: No  ?  Alcohol/week: 0.0 standard drinks  ?  Comment: occasionally  ? Drug use: No  ? Sexual activity: Not on file  ?Other Topics Concern  ? Not on file  ?Social History Narrative  ? Lives alone. She lost her son two years ago. Her daughter lives across town and calls  her mom everyday. Likes to work on puzzles, word searches and read about history in her free time. Likes to do family research.  ? ?Social Determinants of Health  ? ?Financial Resource Strain: Low Risk   ? Difficulty of Paying Living Expenses: Not hard at all  ?Food Insecurity: No Food Insecurity  ? Worried About Charity fundraiser in the Last Year: Never true  ? Ran Out of Food in the Last Year: Never true  ?Transportation Needs: No Transportation Needs  ? Lack of Transportation (Medical): No  ? Lack of Transportation (Non-Medical): No  ?Physical Activity: Inactive  ? Days of Exercise per Week: 0 days  ? Minutes of Exercise per Session: 0 min  ?Stress: No Stress Concern Present  ? Feeling of Stress : Only a little  ?Social Connections: Moderately Isolated  ? Frequency of Communication with Friends and Family: More than three times a week  ? Frequency of Social Gatherings with Friends and Family: Once a week  ? Attends Religious Services: Never  ? Active Member of Clubs or Organizations: Yes  ? Attends Archivist Meetings: Never  ? Marital Status: Widowed  ? ? ?Activities of Daily Living ? ?  07/22/2021  ? 10:23 AM  ?In your present state of health, do you have any difficulty performing the following activities:  ?Hearing? 1  ?Comment bilateral hearing loss (had hearing aids but does not use them anymore).  ?Vision? 0  ?Difficulty concentrating or making decisions? 0  ?Walking or climbing stairs? 0  ?Dressing or bathing? 0  ?Doing errands, shopping? 0  ?Preparing Food and eating ? N  ?Using the Toilet? N  ?In the past six months, have you accidently leaked  urine? Y  ?Comment she has urgency and when she doesn't make it she might leak.  ?Do you have problems with loss of bowel control? N  ?Managing your Medications? N  ?Managing your Finances? N  ?Housekeeping or managing your

## 2021-08-05 ENCOUNTER — Other Ambulatory Visit: Payer: Self-pay | Admitting: Family Medicine

## 2021-08-05 DIAGNOSIS — F411 Generalized anxiety disorder: Secondary | ICD-10-CM

## 2021-09-26 ENCOUNTER — Other Ambulatory Visit: Payer: Self-pay | Admitting: Family Medicine

## 2021-09-26 DIAGNOSIS — R252 Cramp and spasm: Secondary | ICD-10-CM

## 2021-09-26 DIAGNOSIS — N390 Urinary tract infection, site not specified: Secondary | ICD-10-CM

## 2021-10-02 ENCOUNTER — Other Ambulatory Visit: Payer: Self-pay | Admitting: Family Medicine

## 2021-10-02 DIAGNOSIS — F5102 Adjustment insomnia: Secondary | ICD-10-CM

## 2021-10-02 DIAGNOSIS — R252 Cramp and spasm: Secondary | ICD-10-CM

## 2021-10-18 ENCOUNTER — Ambulatory Visit (INDEPENDENT_AMBULATORY_CARE_PROVIDER_SITE_OTHER): Payer: Medicare HMO

## 2021-10-18 ENCOUNTER — Encounter: Payer: Self-pay | Admitting: Family Medicine

## 2021-10-18 ENCOUNTER — Ambulatory Visit (INDEPENDENT_AMBULATORY_CARE_PROVIDER_SITE_OTHER): Payer: Medicare HMO | Admitting: Family Medicine

## 2021-10-18 VITALS — BP 130/64 | HR 87 | Resp 16 | Ht 64.0 in | Wt 160.0 lb

## 2021-10-18 DIAGNOSIS — F419 Anxiety disorder, unspecified: Secondary | ICD-10-CM

## 2021-10-18 DIAGNOSIS — M5410 Radiculopathy, site unspecified: Secondary | ICD-10-CM | POA: Diagnosis not present

## 2021-10-18 DIAGNOSIS — M25512 Pain in left shoulder: Secondary | ICD-10-CM | POA: Diagnosis not present

## 2021-10-18 DIAGNOSIS — M19012 Primary osteoarthritis, left shoulder: Secondary | ICD-10-CM | POA: Diagnosis not present

## 2021-10-18 DIAGNOSIS — G4700 Insomnia, unspecified: Secondary | ICD-10-CM

## 2021-10-18 DIAGNOSIS — R252 Cramp and spasm: Secondary | ICD-10-CM

## 2021-10-18 DIAGNOSIS — J449 Chronic obstructive pulmonary disease, unspecified: Secondary | ICD-10-CM

## 2021-10-18 DIAGNOSIS — R209 Unspecified disturbances of skin sensation: Secondary | ICD-10-CM

## 2021-10-18 DIAGNOSIS — R69 Illness, unspecified: Secondary | ICD-10-CM | POA: Diagnosis not present

## 2021-10-18 DIAGNOSIS — F411 Generalized anxiety disorder: Secondary | ICD-10-CM

## 2021-10-18 DIAGNOSIS — E782 Mixed hyperlipidemia: Secondary | ICD-10-CM | POA: Diagnosis not present

## 2021-10-18 DIAGNOSIS — I10 Essential (primary) hypertension: Secondary | ICD-10-CM | POA: Diagnosis not present

## 2021-10-18 DIAGNOSIS — M545 Low back pain, unspecified: Secondary | ICD-10-CM | POA: Diagnosis not present

## 2021-10-18 DIAGNOSIS — M5136 Other intervertebral disc degeneration, lumbar region: Secondary | ICD-10-CM | POA: Diagnosis not present

## 2021-10-18 MED ORDER — BELSOMRA 15 MG PO TABS: 15.0000 mg | ORAL_TABLET | Freq: Every day | ORAL | 0 refills | Status: DC

## 2021-10-18 NOTE — Assessment & Plan Note (Deleted)
Predominantly struggles with situational anxiety.  But she is currently on sertraline and tolerating that well.

## 2021-10-18 NOTE — Assessment & Plan Note (Signed)
Stable.  We did discuss making sure that she is using her Flovent daily.  She has had some intermittent cough and wheezing and says she felt like she got a cold about a week ago but does feel like she is getting better.  We discussed using her albuterol more as needed for rescue.

## 2021-10-22 LAB — COMPLETE METABOLIC PANEL WITH GFR
AG Ratio: 1.7 (calc) (ref 1.0–2.5)
ALT: 11 U/L (ref 6–29)
AST: 15 U/L (ref 10–35)
Albumin: 4.3 g/dL (ref 3.6–5.1)
Alkaline phosphatase (APISO): 67 U/L (ref 37–153)
BUN: 16 mg/dL (ref 7–25)
CO2: 28 mmol/L (ref 20–32)
Calcium: 9.6 mg/dL (ref 8.6–10.4)
Chloride: 101 mmol/L (ref 98–110)
Creat: 0.74 mg/dL (ref 0.60–0.95)
Globulin: 2.6 g/dL (calc) (ref 1.9–3.7)
Glucose, Bld: 89 mg/dL (ref 65–139)
Potassium: 4 mmol/L (ref 3.5–5.3)
Sodium: 138 mmol/L (ref 135–146)
Total Bilirubin: 0.3 mg/dL (ref 0.2–1.2)
Total Protein: 6.9 g/dL (ref 6.1–8.1)
eGFR: 78 mL/min/{1.73_m2} (ref >=60)

## 2021-10-22 LAB — TSH: TSH: 1.96 mIU/L (ref 0.40–4.50)

## 2021-10-22 LAB — VITAMIN B6: Vitamin B6: 14.3 ng/mL (ref 2.1–21.7)

## 2021-10-22 LAB — LIPID PANEL
Cholesterol: 216 mg/dL — ABNORMAL HIGH (ref <200)
HDL: 48 mg/dL — ABNORMAL LOW (ref >=50)
LDL Cholesterol (Calc): 133 mg/dL (calc) — ABNORMAL HIGH
Non-HDL Cholesterol (Calc): 168 mg/dL (calc) — ABNORMAL HIGH (ref <130)
Total CHOL/HDL Ratio: 4.5 (calc) (ref <5.0)
Triglycerides: 212 mg/dL — ABNORMAL HIGH (ref <150)

## 2021-10-22 LAB — MAGNESIUM: Magnesium: 2.2 mg/dL (ref 1.5–2.5)

## 2021-10-22 LAB — CK: Total CK: 124 U/L (ref 29–143)

## 2021-11-04 ENCOUNTER — Ambulatory Visit: Payer: Medicare HMO | Admitting: Sports Medicine

## 2021-12-27 ENCOUNTER — Telehealth: Payer: Self-pay

## 2021-12-27 NOTE — Telephone Encounter (Signed)
Pls call patient to schedule appt for dizziness. Pt lvm on Friday @ 4pm.

## 2021-12-31 NOTE — Telephone Encounter (Signed)
We scheduled her for tomorrow at 11:20- tvt

## 2022-01-01 ENCOUNTER — Ambulatory Visit (INDEPENDENT_AMBULATORY_CARE_PROVIDER_SITE_OTHER): Payer: Medicare HMO | Admitting: Family Medicine

## 2022-01-01 ENCOUNTER — Encounter: Payer: Self-pay | Admitting: Family Medicine

## 2022-01-01 VITALS — BP 139/65 | HR 74 | Temp 98.1°F | Ht 67.0 in | Wt 162.0 lb

## 2022-01-01 DIAGNOSIS — R42 Dizziness and giddiness: Secondary | ICD-10-CM

## 2022-01-01 DIAGNOSIS — J441 Chronic obstructive pulmonary disease with (acute) exacerbation: Secondary | ICD-10-CM | POA: Diagnosis not present

## 2022-01-01 DIAGNOSIS — Z23 Encounter for immunization: Secondary | ICD-10-CM | POA: Diagnosis not present

## 2022-01-01 DIAGNOSIS — G4700 Insomnia, unspecified: Secondary | ICD-10-CM

## 2022-01-01 DIAGNOSIS — J683 Other acute and subacute respiratory conditions due to chemicals, gases, fumes and vapors: Secondary | ICD-10-CM

## 2022-01-01 DIAGNOSIS — I10 Essential (primary) hypertension: Secondary | ICD-10-CM

## 2022-01-01 MED ORDER — DAYVIGO 5 MG PO TABS: 1.0000 | ORAL_TABLET | Freq: Every day | ORAL | 1 refills | Status: DC

## 2022-01-01 MED ORDER — FLUTICASONE PROPIONATE HFA 110 MCG/ACT IN AERO: 2.0000 | INHALATION_SPRAY | Freq: Two times a day (BID) | RESPIRATORY_TRACT | 5 refills | Status: DC

## 2022-01-01 NOTE — Assessment & Plan Note (Signed)
Urged her to restart her Flovent and okay to use her albuterol as needed.  I did update and send new prescriptions today.  She can call if she needs more albuterol as well.

## 2022-01-01 NOTE — Patient Instructions (Signed)
Please restart the Flovent, 2 puffs inhaled twice a day.  You can use the ventolin/albuterol as needed for shortness of breath, wheezing or chest tightness

## 2022-01-01 NOTE — Progress Notes (Signed)
Acute Office Visit  Subjective:     Patient ID: Beth [name], female    DOB: 1932/07/15, 88 y.o.   MRN: [id number]  Chief Complaint  Patient presents with   Dizziness    HPI Patient is in today for dizziness.  She says last week she noticed it getting out of bed and when she would look down quickly.  She feels like it is affecting her ability to feel safe driving and getting out of the house.  Follow-up chronic insomnia-continues to struggle with sleep problems she is trying to go to bed early but says it does not really seem to help.  Sometimes she gets 3 hours and sometimes she just stays in bed and does not fall asleep until midnight or 1 AM.  She was unable to get the prescription for the Belsomra as the cost was around $100.  Occasionally will use Coricidin and feels like that helps her sleep some.  She also says for the last several weeks she had a little bit more increased chest congestion a little bit more cough at night she has not had any fevers chills or shortness of breath.  Occasionally she will hear little extra wheezing in her chest.  She is not currently using any of her and her inhalers and has not for quite some time.  ROS      Objective:    BP 139/65 (BP Location: Left Arm, Cuff Size: Normal)   Pulse 74   Temp 98.1 F (36.7 C) (Oral)   Ht '5\' 7"'$  (1.702 m)   Wt 162 lb 0.6 oz (73.5 kg)   SpO2 96%   BMI 25.38 kg/m    Physical Exam Vitals and nursing note reviewed.  Constitutional:      Appearance: She is well-developed.  HENT:     Head: Normocephalic and atraumatic.  Cardiovascular:     Rate and Rhythm: Normal rate and regular rhythm.     Heart sounds: Normal heart sounds.  Pulmonary:     Effort: Pulmonary effort is normal.     Breath sounds: Normal breath sounds.  Skin:    General: Skin is warm and dry.  Neurological:     Mental Status: She is alert and oriented to person, place, and time.  Psychiatric:        Behavior: Behavior normal.      No results found for any visits on 01/01/22.      Assessment & Plan:   Problem List Items Addressed This Visit       Cardiovascular and Mediastinum   HYPERTENSION, BENIGN    She did have a 15 point drop with orthostatics but not a full 20 point drop.        Respiratory   Reactive airways dysfunction syndrome ([place])    Urged her to restart her Flovent and okay to use her albuterol as needed.  I did update and send new prescriptions today.  She can call if she needs more albuterol as well.        Other   Insomnia    Unfortunately Belsomra was too costly.  It looks like Dayvigo may be better covered with a better tear on her insurance plan so we will send over prescription but she will have to let me know if it is too expensive.  She is already tried multiple other medications please see overview note.      Relevant Medications   Lemborexant (DAYVIGO) 5 MG TABS   Other Visit  Diagnoses     Dizziness    -  Primary   Relevant Orders   EKG 12-Lead   COPD exacerbation (HCC)       Relevant Medications   fluticasone (FLOVENT HFA) 110 MCG/ACT inhaler   Need for pneumococcal vaccination       Relevant Orders   Pneumococcal conjugate vaccine 20-valent (Prevnar 20) (Completed)      Dizziness-she did have a negative Dix-Hallpike maneuver though she did feel a little dizzy with sitting back up.  We discussed possible referral to physical therapy for vestibular rehab but she declined she wants to just see if it improves over the next week or 2 on its own. EKG today shows rate of 69 bpm, no acute changes.    Meds ordered this encounter  Medications   Lemborexant (DAYVIGO) 5 MG TABS    Sig: Take 1 tablet by mouth at bedtime.    Dispense:  30 tablet    Refill:  1   fluticasone (FLOVENT HFA) 110 MCG/ACT inhaler    Sig: Inhale 2 puffs into the lungs in the morning and at bedtime.    Dispense:  1 each    Refill:  5    Return in about 4 months (around 05/03/2022).  Beatrice Lecher, MD

## 2022-01-01 NOTE — Assessment & Plan Note (Signed)
She did have a 15 point drop with orthostatics but not a full 20 point drop.

## 2022-01-01 NOTE — Assessment & Plan Note (Signed)
Unfortunately Belsomra was too costly.  It looks like Dayvigo may be better covered with a better tear on her insurance plan so we will send over prescription but she will have to let me know if it is too expensive.  She is already tried multiple other medications please see overview note.

## 2022-01-01 NOTE — Addendum Note (Signed)
Addended by: Mertha Finders on: 01/01/2022 02:06 PM   Modules accepted: Orders

## 2022-01-02 LAB — CBC WITH DIFFERENTIAL/PLATELET
Absolute Monocytes: 555 cells/uL (ref 200–950)
Basophils Absolute: 71 cells/uL (ref 0–200)
Basophils Relative: 1.2 %
Eosinophils Absolute: 260 cells/uL (ref 15–500)
Eosinophils Relative: 4.4 %
HCT: 40.2 % (ref 35.0–45.0)
Hemoglobin: 13.1 g/dL (ref 11.7–15.5)
Lymphs Abs: 1475 cells/uL (ref 850–3900)
MCH: 30.2 pg (ref 27.0–33.0)
MCHC: 32.6 g/dL (ref 32.0–36.0)
MCV: 92.6 fL (ref 80.0–100.0)
MPV: 12.6 fL — ABNORMAL HIGH (ref 7.5–12.5)
Monocytes Relative: 9.4 %
Neutro Abs: 3540 cells/uL (ref 1500–7800)
Neutrophils Relative %: 60 %
Platelets: 211 10*3/uL (ref 140–400)
RBC: 4.34 10*6/uL (ref 3.80–5.10)
RDW: 14.1 % (ref 11.0–15.0)
Total Lymphocyte: 25 %
WBC: 5.9 10*3/uL (ref 3.8–10.8)

## 2022-01-02 LAB — BASIC METABOLIC PANEL WITH GFR
BUN: 18 mg/dL (ref 7–25)
CO2: 24 mmol/L (ref 20–32)
Calcium: 9.5 mg/dL (ref 8.6–10.4)
Chloride: 102 mmol/L (ref 98–110)
Creat: 0.81 mg/dL (ref 0.60–0.95)
Glucose, Bld: 108 mg/dL — ABNORMAL HIGH (ref 65–99)
Potassium: 4 mmol/L (ref 3.5–5.3)
Sodium: 139 mmol/L (ref 135–146)
eGFR: 70 mL/min/{1.73_m2} (ref >=60)

## 2022-01-02 NOTE — Progress Notes (Signed)
Call pt : Your lab work is within acceptable range and there are no concerning findings.

## 2022-01-23 ENCOUNTER — Ambulatory Visit: Payer: Medicare HMO | Admitting: Physician Assistant

## 2022-01-27 ENCOUNTER — Other Ambulatory Visit: Payer: Self-pay | Admitting: Family Medicine

## 2022-01-27 DIAGNOSIS — N390 Urinary tract infection, site not specified: Secondary | ICD-10-CM

## 2022-02-10 NOTE — Progress Notes (Unsigned)
02/13/2022 KATJA BLUE [id number] 08-30-1932  Referring provider: Hali Marry, * Primary GI doctor: Dr. Hilarie Fredrickson  ASSESSMENT AND PLAN:   Hiatal hernia with history of Lysbeth Galas erosions Patient's not on proton pump inhibitor at this time, denies any reflux symptoms, possible some clearing of throat and LPR symptoms. Denies black stools and does relate dark stools to Pepto use. Discussed hiatal hernia and potential complications, suggested getting on the omeprazole 40 mg once daily and send in prescription. Unremarkable CBC a month ago without anemia, will recheck today. Most likely patient would benefit from just remaining on proton pump inhibitor once a day, discussed symptoms to look out for and call back if needed, patient does begin to have iron deficiency anemia, would increase Prilosec to max dose of 40 twice a day, and consider endoscopy at that time. -     CBC with Differential/Platelet; Future  Alternating constipation and diarrhea No hematochezia, melena. Add back on Citrucel which she has done well within the past FODMAP diet discussed. Unremarkable colonoscopy 09/2015 other than severe diverticulosis, no recall secondary to age.   History of Present Illness:  88 y.o. female  with a past medical history of GERD with large hiatal hernia and history of Cameron erosions, IBS, severe diverticulosis, constipation with history of solitary rectal cancer and others listed below, returns to clinic today for evaluation of dark stools.  09/2015 endoscopy for dysphagia showed large 7 cm hiatal hernia, Cameron erosions, Schatzki ring dilated to 15.5 mm 09/2015 colonoscopy solitary rectal ulcer and severe diverticulosis  Follow-up 03/2018 PA Ellouise Newer for difficulty with constipation, declined bowel prep, advised MiraLAX twice daily, increase omeprazole 40 mg twice daily, 15 tablets hyoscyamine for severe cramping suggested follow-up 1 to 2 months, lost to follow  up.  Patient presents today and sates she has intermittent diarrhea and constipation.  Admits to not eating well and it is very dependent on what she eats. She was on citracel in the past and was helpful, she is not taking this not.  States when she thought she was going to get diarrhea, was taking pepto and her stools were dark. Otherwise her stools are brown, no melena or hematochezia.  She denies GERD, dysphagia, no AB pain.  Admits to not drinking enough water.   01/01/2022 Labs reviewed CBC hemoglobin 13.1, MCV 92.6, WBC 5.9, platelets 211, BUN 18, creatinine 0.81 No anemia.   States her worse is not sleeping.    She  reports that she has quit smoking. She has never used smokeless tobacco. She reports that she does not drink alcohol and does not use drugs. Her family history includes Cancer in her mother; Dementia in her mother; Depression in her father; Heart disease in her father; Hyperlipidemia in her father and mother; Hypertension in her mother.   Current Medications:    Current Outpatient Medications (Cardiovascular):    diltiazem (CARDIZEM SR) 60 MG 12 hr capsule, Take 1 capsule by mouth twice daily  Current Outpatient Medications (Respiratory):    albuterol (VENTOLIN HFA) 108 (90 Base) MCG/ACT inhaler, Inhale 2 puffs into the lungs every 6 (six) hours as needed for wheezing or shortness of breath.   fluticasone (FLOVENT HFA) 110 MCG/ACT inhaler, Inhale 2 puffs into the lungs in the morning and at bedtime.  Current Outpatient Medications (Analgesics):    HYDROcodone-acetaminophen (NORCO/VICODIN) 5-325 MG tablet, Take 1 tablet by mouth every 4 (four) hours as needed.   ibuprofen (ADVIL,MOTRIN) 200 MG tablet, Take 200 mg by mouth  every 6 (six) hours as needed.  Current Outpatient Medications (Hematological):    vitamin B-12 (CYANOCOBALAMIN) 100 MCG tablet, Take 100 mcg by mouth daily.  Current Outpatient Medications (Other):    calcium carbonate 1250 MG capsule, Take 1,250  mg by mouth daily.   Cholecalciferol (VITAMIN D3) 1000 UNITS tablet, Take 1,000 Units by mouth daily.   Lemborexant (DAYVIGO) 5 MG TABS, Take 1 tablet by mouth at bedtime.   Multiple Vitamins-Minerals (PRESERVISION AREDS 2+MULTI VIT) CAPS, Take by mouth. Preservision AREDS-2   omeprazole (PRILOSEC) 10 MG capsule, Take 10 mg by mouth daily.   penicillin v potassium (VEETID) 500 MG tablet, Take 500 mg by mouth 4 (four) times daily.   Pyridoxine HCl (VITAMIN B-6) 500 MG tablet, Take 500 mg by mouth daily.   sertraline (ZOLOFT) 50 MG tablet, Take 1 tablet by mouth once daily   sulfamethoxazole-trimethoprim (BACTRIM) 400-80 MG tablet, Take 1 tablet by mouth once daily  Surgical History:  She  has a past surgical history that includes Abdominal hysterectomy; Bladder surgery; and Colonoscopy.  Current Medications, Allergies, Past Medical History, Past Surgical History, Family History and Social History were reviewed in [employer] record.  Physical Exam: BP 130/70 (BP Location: Left Arm, Patient Position: Sitting, Cuff Size: Normal)   Pulse 82   Ht '5\' 7"'$  (1.702 m)   Wt 160 lb 6.4 oz (72.8 kg)   SpO2 99%   BMI 25.12 kg/m  General:   Pleasant, well developed female in no acute distress Heart : Regular rate and rhythm; no murmurs Pulm: Clear anteriorly; no wheezing Abdomen:  Soft, Obese AB, Active bowel sounds. No tenderness . , No organomegaly appreciated. Rectal: Not evaluated Extremities:  without  edema. Neurologic:  Alert and  oriented x4;  No focal deficits.  Psych:  Cooperative. Normal mood and affect.   Vladimir Crofts, PA-C 02/13/22

## 2022-02-13 ENCOUNTER — Encounter: Payer: Self-pay | Admitting: Physician Assistant

## 2022-02-13 ENCOUNTER — Other Ambulatory Visit (INDEPENDENT_AMBULATORY_CARE_PROVIDER_SITE_OTHER): Payer: Medicare HMO

## 2022-02-13 ENCOUNTER — Ambulatory Visit: Payer: Medicare HMO | Admitting: Physician Assistant

## 2022-02-13 VITALS — BP 130/70 | HR 82 | Ht 67.0 in | Wt 160.4 lb

## 2022-02-13 DIAGNOSIS — K449 Diaphragmatic hernia without obstruction or gangrene: Secondary | ICD-10-CM

## 2022-02-13 DIAGNOSIS — R198 Other specified symptoms and signs involving the digestive system and abdomen: Secondary | ICD-10-CM | POA: Diagnosis not present

## 2022-02-13 LAB — CBC WITH DIFFERENTIAL/PLATELET
Basophils Absolute: 0.1 10*3/uL (ref 0.0–0.1)
Basophils Relative: 1.2 % (ref 0.0–3.0)
Eosinophils Absolute: 0.4 10*3/uL (ref 0.0–0.7)
Eosinophils Relative: 5.3 % — ABNORMAL HIGH (ref 0.0–5.0)
HCT: 39.8 % (ref 36.0–46.0)
Hemoglobin: 13.2 g/dL (ref 12.0–15.0)
Lymphocytes Relative: 21.2 % (ref 12.0–46.0)
Lymphs Abs: 1.4 10*3/uL (ref 0.7–4.0)
MCHC: 33.1 g/dL (ref 30.0–36.0)
MCV: 89.7 fl (ref 78.0–100.0)
Monocytes Absolute: 0.7 10*3/uL (ref 0.1–1.0)
Monocytes Relative: 10.5 % (ref 3.0–12.0)
Neutro Abs: 4.2 10*3/uL (ref 1.4–7.7)
Neutrophils Relative %: 61.8 % (ref 43.0–77.0)
Platelets: 224 10*3/uL (ref 150.0–400.0)
RBC: 4.44 Mil/uL (ref 3.87–5.11)
RDW: 14.8 % (ref 11.5–15.5)
WBC: 6.8 10*3/uL (ref 4.0–10.5)

## 2022-02-13 MED ORDER — OMEPRAZOLE 40 MG PO CPDR: 40.0000 mg | DELAYED_RELEASE_CAPSULE | Freq: Every day | ORAL | 3 refills | Status: DC

## 2022-02-13 NOTE — Progress Notes (Signed)
Addendum: Reviewed and agree with assessment and management plan. Othello Dickenson M, MD  

## 2022-02-13 NOTE — Patient Instructions (Addendum)
Your provider has requested that you go to the basement level for lab work before leaving today. Press "B" on the elevator. The lab is located at the first door on the left as you exit the elevator.   FIBER SUPPLEMENT You can do metamucil or fibercon once or twice a day but if this causes gas/bloating please switch to Benefiber or Citracel.  Fiber is good for constipation/diarrhea/irritable bowel syndrome.  It can also help with weight loss and can help lower your bad cholesterol (LDL).  Please do 1 TBSP in the morning in water, coffee, or tea.  It can take up to a month before you can see a difference with your bowel movements.  It is cheapest from [employer], [employer], [employer].   TAKE THE PRILOSEC ONCE A DAY AT LEAST   FODMAP stands for fermentable oligo-, di-, mono-saccharides and polyols (1). These are the scientific terms used to classify groups of carbs that are notorious for triggering digestive symptoms like bloating, gas and stomach pain.     Silent reflux: Not all heartburn burns...Marland KitchenMarland KitchenMarland Kitchen  What is LPR? Laryngopharyngeal reflux (LPR) or silent reflux is a condition in which acid that is made in the stomach travels up the esophagus (swallowing tube) and gets to the throat. Not everyone with reflux has a lot of heartburn or indigestion. In fact, many people with LPR never have heartburn. This is why LPR is called SILENT REFLUX, and the terms "Silent reflux" and "LPR" are often used interchangeably. Because LPR is silent, it is sometimes difficult to diagnose.  How can you tell if you have LPR?  Chronic hoarseness- Some people have hoarseness that comes and goes throat clearing  Cough It can cause shortness of breath and cause asthma like symptoms. a feeling of a lump in the throat  difficulty swallowing a problem with too much nose and throat drainage.  Some people will feel their esophagus spasm which feels like their heart beating hard and fast, this will usually be after a meal, at  rest, or lying down at night.    How do I treat this? Treatment for LPR should be individualized, and your doctor will suggest the best treatment for you. Generally there are several treatments for LPR: changing habits and diet to reduce reflux,  medications to reduce stomach acid, and  surgery to prevent reflux. Most people with LPR need to modify how and when they eat, as well as take some medication, to get well. Sometimes, nonprescription liquid antacids, such as Maalox, Gelucil and Mylanta are recommended. When used, these antacids should be taken four times each day - one tablespoon one hour after each meal and before bedtime. Dietary and lifestyle changes alone are not often enough to control LPR - medications that reduce stomach acid are also usually needed. These must be prescribed by our doctor.   TIPS FOR REDUCING REFLUX AND LPR Control your LIFE-STYLE and your DIET! If you use tobacco, QUIT.  Smoking makes you reflux. After every cigarette you have some LPR.  Don't wear clothing that is too tight, especially around the waist (trousers, corsets, belts).  Do not lie down just after eating...in fact, do not eat within three hours of bedtime.  You should be on a low-fat diet.  Limit your intake of red meat.  Limit your intake of butter.  Avoid fried foods.  Avoid chocolate  Avoid cheese.  Avoid eggs. Specifically avoid caffeine (especially coffee and tea), soda pop (especially cola) and mints.  Avoid alcoholic beverages,  particularly in the evening.  Hiatal Hernia  A hiatal hernia occurs when part of the stomach slides above the muscle that separates the abdomen from the chest (diaphragm). A person can be born with a hiatal hernia (congenital), or it may develop over time. In almost all cases of hiatal hernia, only the top part of the stomach pushes through the diaphragm. Many people have a hiatal hernia with no symptoms. The larger the hernia, the more likely it is that you  will have symptoms. In some cases, a hiatal hernia allows stomach acid to flow back into the tube that carries food from your mouth to your stomach (esophagus). This may cause heartburn symptoms. The development of heartburn symptoms may mean that you have a condition called gastroesophageal reflux disease (GERD). What are the causes? This condition is caused by a weakness in the opening (hiatus) where the esophagus passes through the diaphragm to attach to the upper part of the stomach. A person may be born with a weakness in the hiatus, or a weakness can develop over time. What increases the risk? This condition is more likely to develop in: Older people. Age is a major risk factor for a hiatal hernia, especially if you are over the age of 63. Pregnant women. People who are overweight. People who have frequent constipation. What are the signs or symptoms? Symptoms of this condition usually develop in the form of GERD symptoms. Symptoms include: Heartburn. Upset stomach (indigestion). Trouble swallowing. Coughing or wheezing. Wheezing is making high-pitched whistling sounds when you breathe. Sore throat. Chest pain. Nausea and vomiting. How is this diagnosed? This condition may be diagnosed during testing for GERD. Tests that may be done include: X-rays of your stomach or chest. An upper gastrointestinal (GI) series. This is an X-ray exam of your GI tract that is taken after you swallow a chalky liquid that shows up clearly on the X-ray. Endoscopy. This is a procedure to look into your stomach using a thin, flexible tube that has a tiny camera and light on the end of it. How is this treated? This condition may be treated by: Dietary and lifestyle changes to help reduce GERD symptoms. Medicines. These may include: Over-the-counter antacids. Medicines that make your stomach empty more quickly. Medicines that block the production of stomach acid (H2 blockers). Stronger medicines to reduce  stomach acid (proton pump inhibitors). Surgery to repair the hernia, if other treatments are not helping. If you have no symptoms, you may not need treatment. Follow these instructions at home: Lifestyle and activity Do not use any products that contain nicotine or tobacco. These products include cigarettes, chewing tobacco, and vaping devices, such as e-cigarettes. If you need help quitting, ask your health care provider. Try to achieve and maintain a healthy body weight. Avoid putting pressure on your abdomen. Anything that puts pressure on your abdomen increases the amount of acid that may be pushed up into your esophagus. Avoid bending over, especially after eating. Raise the head of your bed by putting blocks under the legs. This keeps your head and esophagus higher than your stomach. Do not wear tight clothing around your chest or stomach. Try not to strain when having a bowel movement, when urinating, or when lifting heavy objects. Eating and drinking Avoid foods that can worsen GERD symptoms. These may include: Fatty foods, like fried foods. Citrus fruits, like oranges or lemon. Other foods and drinks that contain acid, like orange juice or tomatoes. Spicy food. Chocolate. Eat frequent small meals  instead of three large meals a day. This helps prevent your stomach from getting too full. Eat slowly. Do not lie down right after eating. Do not eat 1-2 hours before bed. Do not drink beverages with caffeine. These include cola, coffee, cocoa, and tea. Do not drink alcohol. General instructions Take over-the-counter and prescription medicines only as told by your health care provider. Keep all follow-up visits. Your health care provider will want to check that any new prescribed medicines are helping your symptoms. Contact a health care provider if: Your symptoms are not controlled with medicines or lifestyle changes. You are having trouble swallowing. You have coughing or wheezing  that will not go away. Your pain is getting worse. Your pain spreads to your arms, neck, jaw, teeth, or back. You feel nauseous or you vomit. Get help right away if: You have shortness of breath. You vomit blood. You have bright red blood in your stools. You have black, tarry stools. These symptoms may be an emergency. Get help right away. Call 911. Do not wait to see if the symptoms will go away. Do not drive yourself to the hospital. Summary A hiatal hernia occurs when part of the stomach slides above the muscle that separates the abdomen from the chest. A person may be born with a weakness in the hiatus, or a weakness can develop over time. Symptoms of a hiatal hernia may include heartburn, trouble swallowing, or sore throat. Management of a hiatal hernia includes eating frequent small meals instead of three large meals a day. Get help right away if you vomit blood, have bright red blood in your stools, or have black, tarry stools. This information is not intended to replace advice given to you by your health care provider. Make sure you discuss any questions you have with your health care provider. Document Revised: 06/11/2021 Document Reviewed: 06/11/2021 Elsevier Patient Education  [place].

## 2022-03-18 ENCOUNTER — Other Ambulatory Visit: Payer: Self-pay | Admitting: Family Medicine

## 2022-03-18 DIAGNOSIS — F411 Generalized anxiety disorder: Secondary | ICD-10-CM

## 2022-03-18 DIAGNOSIS — R252 Cramp and spasm: Secondary | ICD-10-CM

## 2022-04-17 ENCOUNTER — Other Ambulatory Visit: Payer: Self-pay | Admitting: Family Medicine

## 2022-04-17 DIAGNOSIS — F411 Generalized anxiety disorder: Secondary | ICD-10-CM

## 2022-04-18 ENCOUNTER — Ambulatory Visit
Admission: EM | Admit: 2022-04-18 | Discharge: 2022-04-18 | Disposition: A | Discharge status: Home / Self Care | Payer: Medicare HMO | Attending: Physician Assistant | Admitting: Physician Assistant

## 2022-04-18 ENCOUNTER — Telehealth (INDEPENDENT_AMBULATORY_CARE_PROVIDER_SITE_OTHER): Payer: Medicare HMO | Admitting: Medical-Surgical

## 2022-04-18 ENCOUNTER — Encounter: Payer: Self-pay | Admitting: Medical-Surgical

## 2022-04-18 DIAGNOSIS — J441 Chronic obstructive pulmonary disease with (acute) exacerbation: Secondary | ICD-10-CM | POA: Diagnosis not present

## 2022-04-18 DIAGNOSIS — Z1152 Encounter for screening for COVID-19: Secondary | ICD-10-CM | POA: Diagnosis not present

## 2022-04-18 DIAGNOSIS — B349 Viral infection, unspecified: Secondary | ICD-10-CM

## 2022-04-18 MED ORDER — PREDNISONE 20 MG PO TABS: 40.0000 mg | ORAL_TABLET | Freq: Every day | ORAL | 0 refills | Status: AC

## 2022-04-18 MED ORDER — AZITHROMYCIN 250 MG PO TABS: 250.0000 mg | ORAL_TABLET | Freq: Every day | ORAL | 0 refills | Status: DC

## 2022-04-18 NOTE — ED Provider Notes (Signed)
[name]    CSN: [id number] Arrival date & time: 04/18/22  1252      History   Chief Complaint Chief Complaint  Patient presents with   Chills   Dizziness   URI    HPI Beth [name] is a 88 y.o. female.   Patient here today for evaluation of cough, congestion, nasal drainage, dizziness and chills that started 2 days ago.  She has known COPD and feels that she has had more shortness of breath and that sputum has changed from clear to a colored yellow mucus.  She denies any vomiting or diarrhea.  She does not report fever.  She has been using her inhaler and OTC medications without resolution of symptoms.  The history is provided by the patient.  Dizziness Associated symptoms: shortness of breath   Associated symptoms: no diarrhea, no nausea and no vomiting   URI Presenting symptoms: congestion, cough and sore throat   Presenting symptoms: no ear pain and no fever   Associated symptoms: wheezing     Past Medical History:  Diagnosis Date   Allergy    Anxiety    situational   Arthritis    Asthma    Cataract    COPD (chronic obstructive pulmonary disease) (HCC)    PER PT   Diverticulitis    Diverticulitis    Emphysema of lung ([place])    Esophageal dysmotility    Fibromyalgia    Hiatal hernia    Hyperlipidemia    Hypertension    Insomnia    Osteopenia    UTI (lower urinary tract infection)     Patient Active Problem List   Diagnosis Date Noted   Mixed hyperlipidemia 10/30/2020   Arthritis 10/26/2020   Asthma 10/26/2020   Cataract 10/26/2020   Diverticulitis 10/26/2020   Esophageal dysmotility 10/26/2020   Fibromyalgia 10/26/2020   Osteopenia 10/26/2020   Adjustment insomnia 10/11/2020   Poor sleep pattern 10/11/2020   Ocular proptosis 01/31/2020   Multiple closed fractures of ribs of left side 05/12/2019   Reactive airways dysfunction syndrome ([place]) 03/30/2018   Memory impairment 02/04/2018   Sciatica, left side 02/04/2018   Recurrent  UTI (urinary tract infection) 07/14/2017   Cardiomegaly 11/06/2015   History of recurrent UTIs 05/29/2015   COPD, mild ([place]) 10/23/2014   Laryngopharyngeal reflux (LPR) 10/23/2014   Headache(784.0) 01/17/2014   Generalized anxiety disorder 01/16/2014   Insomnia 01/16/2014   Lactose intolerance 09/06/2013   Ataxia 03/16/2011   HYPERLIPIDEMIA 11/13/2010   MURMUR 05/11/2009   URINARY URGENCY 05/11/2009   Diaphragmatic hernia 03/14/2009   MACULAR DEGENERATION, RIGHT EYE 12/06/2008   HYPERTENSION, BENIGN 12/06/2008   DIVERTICULITIS, COLON 12/06/2008   FIBROMYALGIA 12/06/2008   OSTEOPENIA 12/06/2008    Past Surgical History:  Procedure Laterality Date   ABDOMINAL HYSTERECTOMY     BLADDER SURGERY     tac   COLONOSCOPY     2008    OB History   No obstetric history on file.      Home Medications    Prior to Admission medications   Medication Sig Start Date End Date Taking? Authorizing Provider  azithromycin (ZITHROMAX) 250 MG tablet Take 1 tablet (250 mg total) by mouth daily. Take first 2 tablets together, then 1 every day until finished. 04/18/22  Yes Francene Finders, PA-C  predniSONE (DELTASONE) 20 MG tablet Take 2 tablets (40 mg total) by mouth daily with breakfast for 5 days. 04/18/22 04/23/22 Yes Francene Finders, PA-C  albuterol (VENTOLIN HFA)  108 (90 Base) MCG/ACT inhaler Inhale 2 puffs into the lungs every 6 (six) hours as needed for wheezing or shortness of breath. 11/05/20   Terrilyn Saver, NP  calcium carbonate 1250 MG capsule Take 1,250 mg by mouth daily.    [provider]  Cholecalciferol (VITAMIN D3) 1000 UNITS tablet Take 1,000 Units by mouth daily.    [provider]  diltiazem (CARDIZEM SR) 60 MG 12 hr capsule Take 1 capsule by mouth twice daily 03/18/22   Hali Marry, MD  fluticasone (FLOVENT HFA) 110 MCG/ACT inhaler Inhale 2 puffs into the lungs in the morning and at bedtime. 01/01/22   Hali Marry, MD   HYDROcodone-acetaminophen (NORCO/VICODIN) 5-325 MG tablet Take 1 tablet by mouth every 4 (four) hours as needed. 02/05/22   [provider]  ibuprofen (ADVIL,MOTRIN) 200 MG tablet Take 200 mg by mouth every 6 (six) hours as needed.    [provider]  Lemborexant (DAYVIGO) 5 MG TABS Take 1 tablet by mouth at bedtime. 01/01/22   Hali Marry, MD  Multiple Vitamins-Minerals (PRESERVISION AREDS 2+MULTI VIT) CAPS Take by mouth. Preservision AREDS-2    [provider]  omeprazole (PRILOSEC) 40 MG capsule Take 1 capsule (40 mg total) by mouth daily. 02/13/22   Vladimir Crofts, PA-C  penicillin v potassium (VEETID) 500 MG tablet Take 500 mg by mouth 4 (four) times daily. 02/05/22   [provider]  Pyridoxine HCl (VITAMIN B-6) 500 MG tablet Take 500 mg by mouth daily.    [provider]  sertraline (ZOLOFT) 50 MG tablet Take 1 tablet by mouth once daily 04/18/22   Hali Marry, MD  sulfamethoxazole-trimethoprim (BACTRIM) 400-80 MG tablet Take 1 tablet by mouth once daily 01/28/22   Hali Marry, MD  vitamin B-12 (CYANOCOBALAMIN) 100 MCG tablet Take 100 mcg by mouth daily.    [provider]    Family History Family History  Problem Relation Age of Onset   Cancer Mother        breast   Hyperlipidemia Mother    Hypertension Mother    Dementia Mother    Heart disease Father        MI   Depression Father    Hyperlipidemia Father    Colon cancer Neg Hx    Esophageal cancer Neg Hx    Stomach cancer Neg Hx    Rectal cancer Neg Hx     Social History Social History   Tobacco Use   Smoking status: Former   Smokeless tobacco: Never   Tobacco comments:    smoker as a teenager  Scientific laboratory technician Use: Never used  Substance Use Topics   Alcohol use: No    Alcohol/week: 0.0 standard drinks of alcohol    Comment: occasionally   Drug use: No     Allergies   Doxycycline, Lunesta [eszopiclone], Pravachol  [pravastatin], Trazodone and nefazodone, and Zocor [simvastatin]   Review of Systems Review of Systems  Constitutional:  Negative for chills and fever.  HENT:  Positive for congestion and sore throat. Negative for ear pain.   Eyes:  Negative for discharge and redness.  Respiratory:  Positive for cough, shortness of breath and wheezing.   Gastrointestinal:  Negative for abdominal pain, diarrhea, nausea and vomiting.  Neurological:  Positive for dizziness.     Physical Exam Triage Vital Signs ED Triage Vitals  Enc Vitals Group     BP 04/18/22 1454 134/81  Pulse Rate 04/18/22 1454 84     Resp 04/18/22 1454 17     Temp 04/18/22 1454 97.9 F (36.6 C)     Temp Source 04/18/22 1454 Oral     SpO2 04/18/22 1454 94 %     Weight --      Height --      Head Circumference --      Peak Flow --      Pain Score 04/18/22 1453 2     Pain Loc --      Pain Edu? --      Excl. in [place]? --    No data found.  Updated Vital Signs BP 134/81 (BP Location: Left Arm)   Pulse 84   Temp 97.9 F (36.6 C) (Oral)   Resp 17   SpO2 94%      Physical Exam Vitals and nursing note reviewed.  Constitutional:      General: She is not in acute distress.    Appearance: Normal appearance. She is not ill-appearing.  HENT:     Head: Normocephalic and atraumatic.     Nose: Congestion present.     Mouth/Throat:     Mouth: Mucous membranes are moist.     Pharynx: No oropharyngeal exudate or posterior oropharyngeal erythema.  Eyes:     Conjunctiva/sclera: Conjunctivae normal.  Cardiovascular:     Rate and Rhythm: Normal rate and regular rhythm.     Heart sounds: Normal heart sounds. No murmur heard. Pulmonary:     Effort: Pulmonary effort is normal. No respiratory distress.     Breath sounds: Wheezing and rhonchi present. No rales.     Comments: Diffuse wheezing and rhonchi noted Skin:    General: Skin is warm and dry.  Neurological:     Mental Status: She is alert.  Psychiatric:        Mood and  Affect: Mood normal.        Thought Content: Thought content normal.      UC Treatments / Results  Labs (all labs ordered are listed, but only abnormal results are displayed) Labs Reviewed  SARS CORONAVIRUS 2 (TAT 6-24 HRS)    EKG   Radiology No results found.  Procedures Procedures (including critical care time)  Medications Ordered in UC Medications - No data to display  Initial Impression / Assessment and Plan / UC Course  I have reviewed the triage vital signs and the nursing notes.  Pertinent labs & imaging results that were available during my care of the patient were reviewed by me and considered in my medical decision making (see chart for details).    Suspect likely COPD exacerbation and will treat with Z-Pak and prednisone.  COVID screening ordered.  Encouraged continued symptomatic treatment, increase fluids and rest.  Recommend she use her inhaler as needed.  Encouraged follow-up with any further concerns or worsening symptoms.  Final Clinical Impressions(s) / UC Diagnoses   Final diagnoses:  Encounter for screening for COVID-19  COPD exacerbation [facility])   Discharge Instructions   None    ED Prescriptions     Medication Sig Dispense Auth. Provider   predniSONE (DELTASONE) 20 MG tablet Take 2 tablets (40 mg total) by mouth daily with breakfast for 5 days. 10 tablet Ewell Poe F, PA-C   azithromycin (ZITHROMAX) 250 MG tablet Take 1 tablet (250 mg total) by mouth daily. Take first 2 tablets together, then 1 every day until finished. 6 tablet Francene Finders, PA-C  PDMP not reviewed this encounter.   Francene Finders, PA-C 04/18/22 1614

## 2022-04-18 NOTE — ED Triage Notes (Signed)
Pt presents with cough, nasal drainage, dizziness and chills X 2 days that is unrelieved with OTC medication.

## 2022-04-18 NOTE — Progress Notes (Signed)
Virtual Visit via Telephone   I connected with  Beth [name]  on 04/18/22 by telephone/telehealth and verified that I am speaking with the correct person using two identifiers.   I discussed the limitations, risks, security and privacy concerns of performing an evaluation and management service by telephone, including the higher likelihood of inaccurate diagnosis and treatment, and the availability of in person appointments.  We also discussed the likely need of an additional face to face encounter for complete and high quality delivery of care.  I also discussed with the patient that there may be a patient responsible charge related to this service. The patient expressed understanding and wishes to proceed.  Provider location is in medical facility. Patient location is at their home, different from provider location. People involved in care of the patient during this telehealth encounter were myself, my nurse/medical assistant, and my front office/scheduling team member.  CC: URI symptoms  HPI: Pleasant 88 year old female presenting via telephone with complaints of approximately 48 hours of upper respiratory symptoms including sinus drainage, headache, fever, sore throat, and cough.  Also reports severe rhinorrhea and tearing.  Has been experiencing quite a bit of dizziness.  Reports she has shortness of breath that is requiring her to have to prop herself up in bed in order to breathe comfortably.  Does note that she has COPD.  Has tried using her albuterol inhaler and taking Mucinex.  Feels the Mucinex helped slow down some of her secretions but has had no significant relief of symptoms.  During check-in, she reported to the MA that she feels bad and thinks that she may have to call an ambulance.  Review of Systems: See HPI for pertinent positives and negatives.   Objective Findings:    General: Speaking full sentences, no audible heavy breathing.  Sounds alert and appropriately interactive.     Impression and Recommendations:    1. Viral illness Unfortunately, her symptoms are most consistent with a viral illness.  In the setting of dizziness, shortness of breath, and orthopnea, strongly recommend that she be seen in person at urgent care or contact an ambulance for transport to the ED.  Recommended this to her but she was adamant that she was not calling an ambulance or going to the ED this close to Christmas.  Admitted that she had considered going to urgent care but she felt so bad she did not feel like leaving the house.  Again strongly advised she be seen in person for testing as we cannot provide definitive treatment or diagnosis via telephone without knowing what we are treating.  Unable to determine if her symptoms are COVID, flu, RSV, or other illness without being able to complete testing.  Advised that without this testing, there is no way to know what treatment would be the best option for her.  Patient verbalized understanding and is still hesitant to go to the ED or urgent care.  Notes that if her symptoms get worse, she will go but until then she is considering trying an over-the-counter medication.  Advised that she should aim for over-the-counter cold and flu preparations that are made for patients with high blood pressure/heart problems.  Patient verbalized understanding.  I discussed the above assessment and treatment plan with the patient. The patient was provided an opportunity to ask questions and all were answered. The patient agreed with the plan and demonstrated an understanding of the instructions.   The patient was advised to call back or seek an in-person  evaluation if the symptoms worsen or if the condition fails to improve as anticipated.  20 minutes of non-face-to-face time was provided during this encounter.  Return if symptoms worsen or fail to improve. ___________________________________________ Samuel Bouche, DNP, APRN, FNP-BC [facility] [place]

## 2022-04-19 LAB — SARS CORONAVIRUS 2 (TAT 6-24 HRS): SARS Coronavirus 2: NEGATIVE

## 2022-05-06 ENCOUNTER — Ambulatory Visit (INDEPENDENT_AMBULATORY_CARE_PROVIDER_SITE_OTHER): Payer: Medicare HMO

## 2022-05-06 ENCOUNTER — Encounter: Payer: Self-pay | Admitting: Family Medicine

## 2022-05-06 ENCOUNTER — Ambulatory Visit (INDEPENDENT_AMBULATORY_CARE_PROVIDER_SITE_OTHER): Payer: Medicare HMO | Admitting: Family Medicine

## 2022-05-06 VITALS — BP 136/70 | HR 85 | Ht 64.0 in | Wt 161.0 lb

## 2022-05-06 DIAGNOSIS — J22 Unspecified acute lower respiratory infection: Secondary | ICD-10-CM | POA: Diagnosis not present

## 2022-05-06 DIAGNOSIS — G4700 Insomnia, unspecified: Secondary | ICD-10-CM | POA: Diagnosis not present

## 2022-05-06 DIAGNOSIS — R0602 Shortness of breath: Secondary | ICD-10-CM | POA: Diagnosis not present

## 2022-05-06 DIAGNOSIS — R059 Cough, unspecified: Secondary | ICD-10-CM | POA: Diagnosis not present

## 2022-05-06 DIAGNOSIS — I10 Essential (primary) hypertension: Secondary | ICD-10-CM

## 2022-05-06 DIAGNOSIS — J441 Chronic obstructive pulmonary disease with (acute) exacerbation: Secondary | ICD-10-CM | POA: Diagnosis not present

## 2022-05-06 DIAGNOSIS — J439 Emphysema, unspecified: Secondary | ICD-10-CM | POA: Diagnosis not present

## 2022-05-06 DIAGNOSIS — J40 Bronchitis, not specified as acute or chronic: Secondary | ICD-10-CM

## 2022-05-06 MED ORDER — PREDNISONE 20 MG PO TABS: 40.0000 mg | ORAL_TABLET | Freq: Every day | ORAL | 0 refills | Status: DC

## 2022-05-06 MED ORDER — ALBUTEROL SULFATE HFA 108 (90 BASE) MCG/ACT IN AERS: 2.0000 | INHALATION_SPRAY | Freq: Four times a day (QID) | RESPIRATORY_TRACT | 99 refills | Status: AC | PRN

## 2022-05-06 MED ORDER — AMOXICILLIN-POT CLAVULANATE 875-125 MG PO TABS: 1.0000 | ORAL_TABLET | Freq: Two times a day (BID) | ORAL | 0 refills | Status: DC

## 2022-05-06 NOTE — Progress Notes (Signed)
Pt reports that she has been sick since before christmas. She went to an UC and was tested for Covid at that time and was Negative. They treated her with Prednisone and Azithromycin. She stated that she hasn't felt good since then. She continues to have a cough, no appetite, pain in her lower R side her skin is dry and peeling. She said that her cough is more of a COPD cough. It is in the center of her chest and the mucus is clear and stringy. She denies any f/s/c/n/v

## 2022-05-06 NOTE — Progress Notes (Signed)
Established Patient Office Visit  Subjective   Patient ID: Beth [name], female    DOB: [birth year]  Age: 89 y.o. MRN: [id number]  Chief Complaint  Patient presents with   Hypertension    HPI 64-monthfollow-up. Hypertension- Pt denies chest pain, SOB, dizziness, or heart palpitations.  Taking meds as directed w/o problems.  Denies medication side effects.    Follow-up insomnia-she has actually been resting a little bit better the last few weeks.  She has been taking the Dayvigo for at least a month at this point she picked up a refill a few weeks ago.  Has not noticed any negative side effects and feels like she is sleeping better.  She has also been sick with a COPD exacerbation and feels like she is getting a little bit worse.  She actually went to urgent care on December 22.  She was prescribed azithromycin and prednisone and says she actually started to feel better until the last 2 days she has started to feel worse she is had an increase in her cough as well as fatigue.  No fevers or chills.    ROS    Objective:     BP 136/70   Pulse 85   Ht '5\' 4"'$  (1.626 m)   Wt 161 lb (73 kg)   SpO2 97%   BMI 27.64 kg/m    Physical Exam Constitutional:      Appearance: She is well-developed.  HENT:     Head: Normocephalic and atraumatic.     Right Ear: External ear normal.     Left Ear: External ear normal.     Nose: Nose normal.  Eyes:     Conjunctiva/sclera: Conjunctivae normal.     Pupils: Pupils are equal, round, and reactive to light.  Neck:     Thyroid: No thyromegaly.  Cardiovascular:     Rate and Rhythm: Normal rate and regular rhythm.     Heart sounds: Normal heart sounds.  Pulmonary:     Effort: Pulmonary effort is normal.     Breath sounds: Wheezing and rhonchi present.     Comments: diffuse Musculoskeletal:     Cervical back: Neck supple.  Lymphadenopathy:     Cervical: No cervical adenopathy.  Skin:    General: Skin is warm and dry.  Neurological:      Mental Status: She is alert and oriented to person, place, and time.      No results found for any visits on 05/06/22.    The ASCVD Risk score (Arnett DK, et al., 2019) failed to calculate for the following reasons:   The 2019 ASCVD risk score is only valid for ages 440to 713   Assessment & Plan:   Problem List Items Addressed This Visit       Cardiovascular and Mediastinum   HYPERTENSION, BENIGN - Primary    Well controlled. Continue current regimen. Follow up in  686mo       Other   Insomnia    SHe actually has been sleeping better recently off of all sleep aids which is wonderful.  Continue Dayvigo.      Other Visit Diagnoses     COPD exacerbation (HCC)       Relevant Medications   amoxicillin-clavulanate (AUGMENTIN) 875-125 MG tablet   predniSONE (DELTASONE) 20 MG tablet   albuterol (VENTOLIN HFA) 108 (90 Base) MCG/ACT inhaler   Other Relevant Orders   DG Chest 2 View   Lower respiratory infection  Relevant Medications   albuterol (VENTOLIN HFA) 108 (90 Base) MCG/ACT inhaler       COPD exacerbation-I am concerned that she is actually felt worse in the last couple days after starting to improve.  Will get a chest x-ray as she has diffuse rhonchi and wheezing on exam.  Will go ahead and start Augmentin and give another round of prednisone as needed.  If she feels like she is getting worse or spikes a fever or becomes more short of breath then please let us know or seek emergency care if its after hours.  Katie use albuterol as needed.  No follow-ups on file.    Beatrice Lecher, MD

## 2022-05-06 NOTE — Assessment & Plan Note (Signed)
Well controlled. Continue current regimen. Follow up in  6 mo  

## 2022-05-06 NOTE — Assessment & Plan Note (Addendum)
SHe actually has been sleeping better recently off of all sleep aids which is wonderful.  Continue Dayvigo.

## 2022-05-07 NOTE — Progress Notes (Signed)
Call pt: no Pneumonia.  I hope she starts feeling better.

## 2022-05-22 ENCOUNTER — Encounter: Payer: Self-pay | Admitting: Internal Medicine

## 2022-06-09 ENCOUNTER — Other Ambulatory Visit: Payer: Self-pay | Admitting: Family Medicine

## 2022-06-09 DIAGNOSIS — G4700 Insomnia, unspecified: Secondary | ICD-10-CM

## 2022-06-12 ENCOUNTER — Other Ambulatory Visit: Payer: Self-pay

## 2022-06-12 ENCOUNTER — Encounter: Payer: Self-pay | Admitting: Emergency Medicine

## 2022-06-12 ENCOUNTER — Ambulatory Visit
Admission: EM | Admit: 2022-06-12 | Discharge: 2022-06-12 | Disposition: A | Discharge status: Home / Self Care | Payer: Medicare HMO | Attending: Internal Medicine | Admitting: Internal Medicine

## 2022-06-12 DIAGNOSIS — N3000 Acute cystitis without hematuria: Secondary | ICD-10-CM | POA: Insufficient documentation

## 2022-06-12 DIAGNOSIS — R350 Frequency of micturition: Secondary | ICD-10-CM | POA: Insufficient documentation

## 2022-06-12 LAB — POCT URINALYSIS DIP (MANUAL ENTRY)
Bilirubin, UA: NEGATIVE
Blood, UA: NEGATIVE
Glucose, UA: NEGATIVE mg/dL
Ketones, POC UA: NEGATIVE mg/dL
Nitrite, UA: POSITIVE — AB
Spec Grav, UA: >=1.03 — AB (ref 1.010–1.025)
Urobilinogen, UA: 0.2 E.U./dL
pH, UA: 6 (ref 5.0–8.0)

## 2022-06-12 MED ORDER — CEPHALEXIN 500 MG PO CAPS: 500.0000 mg | ORAL_CAPSULE | Freq: Two times a day (BID) | ORAL | 0 refills | Status: AC

## 2022-06-12 NOTE — ED Triage Notes (Addendum)
Reports concerns for a uti intermittent for 3 weeks.  Patient has a history of uti.  Has been taking otc medicines - azo for the same. Symptoms are no worse, just knew she needed to be seen.    Symptoms include urgency, lower back pain, feeling the need to go with minimal output  Reports urine is dark and has an odor

## 2022-06-12 NOTE — Discharge Instructions (Signed)
I am treating you with an antibiotic for urinary tract infection.  Please follow-up if any symptoms persist or worsen.

## 2022-06-12 NOTE — ED Provider Notes (Signed)
[name]    CSN: [id number] Arrival date & time: 06/12/22  1553      History   Chief Complaint Chief Complaint  Patient presents with   Urinary Tract Infection    HPI Beth [name] is a 89 y.o. female.   Patient presents with concerns for urinary tract infection today given urinary burning, urinary urgency, urinary frequency that has been present intermittently for the past 3 weeks.  Patient states that she has a history of recurrent urinary tract infections and has intermittent symptoms frequently.  Denies hematuria, vaginal discharge, abdominal pain, back pain, fever.  Patient has taken Azo for symptoms.  She denies that she takes any preventative medications or antibiotics.  Has seen urology for symptoms with last visit being 1 year ago.   Urinary Tract Infection   Past Medical History:  Diagnosis Date   Allergy    Anxiety    situational   Arthritis    Asthma    Cataract    COPD (chronic obstructive pulmonary disease) (HCC)    PER PT   Diverticulitis    Diverticulitis    Emphysema of lung ([place])    Esophageal dysmotility    Fibromyalgia    Hiatal hernia    Hyperlipidemia    Hypertension    Insomnia    Osteopenia    UTI (lower urinary tract infection)     Patient Active Problem List   Diagnosis Date Noted   Mixed hyperlipidemia 10/30/2020   Arthritis 10/26/2020   Asthma 10/26/2020   Cataract 10/26/2020   Diverticulitis 10/26/2020   Esophageal dysmotility 10/26/2020   Fibromyalgia 10/26/2020   Osteopenia 10/26/2020   Adjustment insomnia 10/11/2020   Poor sleep pattern 10/11/2020   Ocular proptosis 01/31/2020   Reactive airways dysfunction syndrome ([place]) 03/30/2018   Memory impairment 02/04/2018   Sciatica, left side 02/04/2018   Recurrent UTI (urinary tract infection) 07/14/2017   Cardiomegaly 11/06/2015   History of recurrent UTIs 05/29/2015   COPD, mild ([place]) 10/23/2014   Laryngopharyngeal reflux (LPR) 10/23/2014   Headache(784.0)  01/17/2014   Generalized anxiety disorder 01/16/2014   Insomnia 01/16/2014   Lactose intolerance 09/06/2013   Ataxia 03/16/2011   HYPERLIPIDEMIA 11/13/2010   MURMUR 05/11/2009   URINARY URGENCY 05/11/2009   Diaphragmatic hernia 03/14/2009   MACULAR DEGENERATION, RIGHT EYE 12/06/2008   HYPERTENSION, BENIGN 12/06/2008   DIVERTICULITIS, COLON 12/06/2008   FIBROMYALGIA 12/06/2008   OSTEOPENIA 12/06/2008    Past Surgical History:  Procedure Laterality Date   ABDOMINAL HYSTERECTOMY     BLADDER SURGERY     tac   COLONOSCOPY     2008    OB History   No obstetric history on file.      Home Medications    Prior to Admission medications   Medication Sig Start Date End Date Taking? Authorizing Provider  cephALEXin (KEFLEX) 500 MG capsule Take 1 capsule (500 mg total) by mouth 2 (two) times daily for 7 days. 06/12/22 06/19/22 Yes Maverick Patman, Michele Rockers, FNP  albuterol (VENTOLIN HFA) 108 (90 Base) MCG/ACT inhaler Inhale 2 puffs into the lungs every 6 (six) hours as needed for wheezing or shortness of breath. 05/06/22   Hali Marry, MD  calcium carbonate 1250 MG capsule Take 1,250 mg by mouth daily.    [provider]  Cholecalciferol (VITAMIN D3) 1000 UNITS tablet Take 1,000 Units by mouth daily.    [provider]  diltiazem (CARDIZEM SR) 60 MG 12 hr capsule Take 1 capsule by mouth twice  daily 03/18/22   Hali Marry, MD  fluticasone (FLOVENT HFA) 110 MCG/ACT inhaler Inhale 2 puffs into the lungs in the morning and at bedtime. 01/01/22   Hali Marry, MD  ibuprofen (ADVIL,MOTRIN) 200 MG tablet Take 200 mg by mouth every 6 (six) hours as needed.    [provider]  Lemborexant (DAYVIGO) 5 MG TABS TAKE 1 TABLET BY MOUTH ONCE DAILY AT BEDTIME 06/09/22   Hali Marry, MD  omeprazole (PRILOSEC) 40 MG capsule Take 1 capsule (40 mg total) by mouth daily. 02/13/22   Vladimir Crofts, PA-C  predniSONE (DELTASONE) 20 MG tablet Take 2 tablets (40  mg total) by mouth daily with breakfast. Patient not taking: Reported on 06/12/2022 05/06/22   Hali Marry, MD  Pyridoxine HCl (VITAMIN B-6) 500 MG tablet Take 500 mg by mouth daily.    [provider]  sertraline (ZOLOFT) 50 MG tablet Take 1 tablet by mouth once daily 04/18/22   Hali Marry, MD  vitamin B-12 (CYANOCOBALAMIN) 100 MCG tablet Take 100 mcg by mouth daily.    [provider]    Family History Family History  Problem Relation Age of Onset   Cancer Mother        breast   Hyperlipidemia Mother    Hypertension Mother    Dementia Mother    Heart disease Father        MI   Depression Father    Hyperlipidemia Father    Colon cancer Neg Hx    Esophageal cancer Neg Hx    Stomach cancer Neg Hx    Rectal cancer Neg Hx     Social History Social History   Tobacco Use   Smoking status: Former   Smokeless tobacco: Never   Tobacco comments:    smoker as a teenager  Scientific laboratory technician Use: Never used  Substance Use Topics   Alcohol use: No    Alcohol/week: 0.0 standard drinks of alcohol    Comment: occasionally   Drug use: No     Allergies   Doxycycline, Lunesta [eszopiclone], Pravachol [pravastatin], Trazodone and nefazodone, and Zocor [simvastatin]   Review of Systems Review of Systems Per HPI  Physical Exam Triage Vital Signs ED Triage Vitals  Enc Vitals Group     BP 06/12/22 1805 (!) 158/73     Pulse Rate 06/12/22 1805 74     Resp 06/12/22 1805 18     Temp 06/12/22 1805 (!) 97.5 F (36.4 C)     Temp Source 06/12/22 1805 Oral     SpO2 06/12/22 1805 95 %     Weight --      Height --      Head Circumference --      Peak Flow --      Pain Score 06/12/22 1802 3     Pain Loc --      Pain Edu? --      Excl. in [place]? --    No data found.  Updated Vital Signs BP (!) 158/73 (BP Location: Left Arm)   Pulse 74   Temp (!) 97.5 F (36.4 C) (Oral)   Resp 18   SpO2 95%   Visual Acuity Right Eye Distance:   Left Eye  Distance:   Bilateral Distance:    Right Eye Near:   Left Eye Near:    Bilateral Near:     Physical Exam Constitutional:      General: She is not in acute  distress.    Appearance: Normal appearance. She is not toxic-appearing or diaphoretic.  HENT:     Head: Normocephalic and atraumatic.  Eyes:     Extraocular Movements: Extraocular movements intact.     Conjunctiva/sclera: Conjunctivae normal.  Pulmonary:     Effort: Pulmonary effort is normal.  Abdominal:     General: Bowel sounds are normal. There is no distension.     Palpations: Abdomen is soft.     Tenderness: There is no abdominal tenderness. There is no right CVA tenderness or left CVA tenderness.  Neurological:     General: No focal deficit present.     Mental Status: She is alert and oriented to person, place, and time. Mental status is at baseline.  Psychiatric:        Mood and Affect: Mood normal.        Behavior: Behavior normal.        Thought Content: Thought content normal.        Judgment: Judgment normal.      UC Treatments / Results  Labs (all labs ordered are listed, but only abnormal results are displayed) Labs Reviewed  POCT URINALYSIS DIP (MANUAL ENTRY) - Abnormal; Notable for the following components:      Result Value   Clarity, UA cloudy (*)    Spec Grav, UA >=1.030 (*)    Protein Ur, POC trace (*)    Nitrite, UA Positive (*)    Leukocytes, UA Small (1+) (*)    All other components within normal limits  URINE CULTURE    EKG   Radiology No results found.  Procedures Procedures (including critical care time)  Medications Ordered in UC Medications - No data to display  Initial Impression / Assessment and Plan / UC Course  I have reviewed the triage vital signs and the nursing notes.  Pertinent labs & imaging results that were available during my care of the patient were reviewed by me and considered in my medical decision making (see chart for details).     UA indicating  urinary tract infection.  Will treat with cephalexin.  Creatinine clearance appears to be 59 so no dosage adjustment necessary.  Urine culture pending.  Advised patient to follow-up if any symptoms persist or worsen.  Patient used to take Bactrim according to chart but she denies that she takes this daily currently.  Patient verbalized understanding and was agreeable with plan. Final Clinical Impressions(s) / UC Diagnoses   Final diagnoses:  Acute cystitis without hematuria  Urinary frequency     Discharge Instructions      I am treating you with an antibiotic for urinary tract infection.  Please follow-up if any symptoms persist or worsen.    ED Prescriptions     Medication Sig Dispense Auth. Provider   cephALEXin (KEFLEX) 500 MG capsule Take 1 capsule (500 mg total) by mouth 2 (two) times daily for 7 days. 14 capsule [place], Michele Rockers, [place]      PDMP not reviewed this encounter.   Teodora Medici, [place] 06/12/22 [phone]

## 2022-06-14 LAB — URINE CULTURE: Culture: >=100000 — AB

## 2022-06-24 DIAGNOSIS — Z961 Presence of intraocular lens: Secondary | ICD-10-CM | POA: Diagnosis not present

## 2022-06-24 DIAGNOSIS — Z0100 Encounter for examination of eyes and vision without abnormal findings: Secondary | ICD-10-CM | POA: Diagnosis not present

## 2022-06-24 DIAGNOSIS — H43812 Vitreous degeneration, left eye: Secondary | ICD-10-CM | POA: Diagnosis not present

## 2022-06-24 DIAGNOSIS — H31091 Other chorioretinal scars, right eye: Secondary | ICD-10-CM | POA: Diagnosis not present

## 2022-06-24 DIAGNOSIS — H18513 Endothelial corneal dystrophy, bilateral: Secondary | ICD-10-CM | POA: Diagnosis not present

## 2022-06-24 DIAGNOSIS — H04123 Dry eye syndrome of bilateral lacrimal glands: Secondary | ICD-10-CM | POA: Diagnosis not present

## 2022-06-24 DIAGNOSIS — H1045 Other chronic allergic conjunctivitis: Secondary | ICD-10-CM | POA: Diagnosis not present

## 2022-06-24 DIAGNOSIS — H353131 Nonexudative age-related macular degeneration, bilateral, early dry stage: Secondary | ICD-10-CM | POA: Diagnosis not present

## 2022-06-24 DIAGNOSIS — H40013 Open angle with borderline findings, low risk, bilateral: Secondary | ICD-10-CM | POA: Diagnosis not present

## 2022-07-15 ENCOUNTER — Ambulatory Visit (INDEPENDENT_AMBULATORY_CARE_PROVIDER_SITE_OTHER): Payer: Medicare HMO | Admitting: Family Medicine

## 2022-07-15 ENCOUNTER — Encounter: Payer: Self-pay | Admitting: Family Medicine

## 2022-07-15 VITALS — BP 172/86 | HR 76 | Temp 97.7°F | Ht 67.0 in | Wt 162.0 lb

## 2022-07-15 DIAGNOSIS — J302 Other seasonal allergic rhinitis: Secondary | ICD-10-CM | POA: Diagnosis not present

## 2022-07-15 DIAGNOSIS — R350 Frequency of micturition: Secondary | ICD-10-CM | POA: Diagnosis not present

## 2022-07-15 DIAGNOSIS — R69 Illness, unspecified: Secondary | ICD-10-CM | POA: Diagnosis not present

## 2022-07-15 DIAGNOSIS — F411 Generalized anxiety disorder: Secondary | ICD-10-CM

## 2022-07-15 LAB — POCT URINALYSIS DIP (CLINITEK)
Bilirubin, UA: NEGATIVE
Blood, UA: NEGATIVE
Glucose, UA: NEGATIVE mg/dL
Ketones, POC UA: NEGATIVE mg/dL
Nitrite, UA: NEGATIVE
POC PROTEIN,UA: NEGATIVE
Spec Grav, UA: 1.025 (ref 1.010–1.025)
Urobilinogen, UA: 0.2 E.U./dL
pH, UA: 6.5 (ref 5.0–8.0)

## 2022-07-15 MED ORDER — CETIRIZINE HCL 10 MG PO TABS: 10.0000 mg | ORAL_TABLET | Freq: Every day | ORAL | 3 refills | Status: AC

## 2022-07-15 MED ORDER — SERTRALINE HCL 50 MG PO TABS: 50.0000 mg | ORAL_TABLET | Freq: Every day | ORAL | 0 refills | Status: DC

## 2022-07-15 MED ORDER — CEPHALEXIN 500 MG PO CAPS: 500.0000 mg | ORAL_CAPSULE | Freq: Two times a day (BID) | ORAL | 0 refills | Status: AC

## 2022-07-15 NOTE — Assessment & Plan Note (Signed)
-   refilled zoloft - pt tolerating well with no issues

## 2022-07-15 NOTE — Assessment & Plan Note (Signed)
-   poc UA done and positive for leukocytes. Will go ahead and treat with keflex. Will also culture urine to ensure antibiotic will kill bacteria. Told patient we would reach out if we need to change the antibiotic  - she does have a hx of utis and said this is her third one this year. She did have to be on pxp meds in the past. We discussed talking to her pcp about this at her next visit to see if she should be on daily medication

## 2022-07-15 NOTE — Progress Notes (Signed)
Established patient visit   Patient: Beth [name]   DOB: 07-Mar-1933   89 y.o. Female  MRN: [id number] Visit Date: 07/15/2022  Today's healthcare provider: Owens Loffler, DO   Chief Complaint  Patient presents with   Follow-up    Pt present with uti symptom that started 3 days ago, pt states she swallowed an ibuprofen pill wrong and has throat pain since , pt also states she has had lip pilling from a crown that placed at the dentis 3 weeks ago, and had so seasonal allergies     SUBJECTIVE    Chief Complaint  Patient presents with   Follow-up    Pt present with uti symptom that started 3 days ago, pt states she swallowed an ibuprofen pill wrong and has throat pain since , pt also states she has had lip pilling from a crown that placed at the dentis 3 weeks ago, and had so seasonal allergies    HPI HPI     Follow-up    Additional comments: Pt present with uti symptom that started 3 days ago, pt states she swallowed an ibuprofen pill wrong and has throat pain since , pt also states she has had lip pilling from a crown that placed at the dentis 3 weeks ago, and had so seasonal allergies       Last edited by Silvano Rusk, CMA on 07/15/2022  1:47 PM.      Pt presents for acute visit. Beth [name]  urinary frequency and back pain. She said she just got over a UTI and is still having back pain and was concerned she still had a UTI.   Seasonal allergies  - on zyrtec doing well but needs refills  Pt needs refill on her zoloft   She also notes peeling of her lips that is just now getting better with vaseline.   Review of Systems  Constitutional:  Negative for activity change, fatigue and fever.  Respiratory:  Negative for cough and shortness of breath.   Cardiovascular:  Negative for chest pain.  Gastrointestinal:  Negative for abdominal pain.  Genitourinary:  Positive for frequency. Negative for difficulty urinating and flank pain.       Current Meds  Medication  Sig   albuterol (VENTOLIN HFA) 108 (90 Base) MCG/ACT inhaler Inhale 2 puffs into the lungs every 6 (six) hours as needed for wheezing or shortness of breath.   calcium carbonate 1250 MG capsule Take 1,250 mg by mouth daily.   cephALEXin (KEFLEX) 500 MG capsule Take 1 capsule (500 mg total) by mouth 2 (two) times daily for 7 days.   cetirizine (ZYRTEC) 10 MG tablet Take 1 tablet (10 mg total) by mouth daily.   Cholecalciferol (VITAMIN D3) 1000 UNITS tablet Take 1,000 Units by mouth daily.   diltiazem (CARDIZEM SR) 60 MG 12 hr capsule Take 1 capsule by mouth twice daily   fluticasone (FLOVENT HFA) 110 MCG/ACT inhaler Inhale 2 puffs into the lungs in the morning and at bedtime.   ibuprofen (ADVIL,MOTRIN) 200 MG tablet Take 200 mg by mouth every 6 (six) hours as needed.   Lemborexant (DAYVIGO) 5 MG TABS TAKE 1 TABLET BY MOUTH ONCE DAILY AT BEDTIME   omeprazole (PRILOSEC) 40 MG capsule Take 1 capsule (40 mg total) by mouth daily.   predniSONE (DELTASONE) 20 MG tablet Take 2 tablets (40 mg total) by mouth daily with breakfast.   Pyridoxine HCl (VITAMIN B-6) 500 MG tablet Take 500 mg by mouth  daily.   vitamin B-12 (CYANOCOBALAMIN) 100 MCG tablet Take 100 mcg by mouth daily.   [DISCONTINUED] sertraline (ZOLOFT) 50 MG tablet Take 1 tablet by mouth once daily    OBJECTIVE    BP (!) 172/86   Pulse 76   Temp 97.7 F (36.5 C) (Oral)   Ht 5\' 7"  (1.702 m)   Wt 162 lb (73.5 kg)   SpO2 99%   BMI 25.37 kg/m   Physical Exam Vitals and nursing note reviewed.  Constitutional:      General: She is not in acute distress.    Appearance: Normal appearance.  HENT:     Head: Normocephalic and atraumatic.     Right Ear: External ear normal.     Left Ear: External ear normal.     Nose: Nose normal.  Eyes:     Conjunctiva/sclera: Conjunctivae normal.  Cardiovascular:     Rate and Rhythm: Normal rate and regular rhythm.  Pulmonary:     Effort: Pulmonary effort is normal.     Breath sounds: Normal  breath sounds.  Neurological:     General: No focal deficit present.     Mental Status: She is alert and oriented to person, place, and time.  Psychiatric:        Mood and Affect: Mood normal.        Behavior: Behavior normal.        Thought Content: Thought content normal.        Judgment: Judgment normal.        ASSESSMENT & PLAN    Problem List Items Addressed This Visit       Other   Generalized anxiety disorder    - refilled zoloft - pt tolerating well with no issues      Relevant Medications   sertraline (ZOLOFT) 50 MG tablet   Urinary frequency - Primary    - poc UA done and positive for leukocytes. Will go ahead and treat with keflex. Will also culture urine to ensure antibiotic will kill bacteria. Told patient we would reach out if we need to change the antibiotic  - she does have a hx of utis and said this is her third one this year. She did have to be on pxp meds in the past. We discussed talking to her pcp about this at her next visit to see if she should be on daily medication       Relevant Orders   POCT URINALYSIS DIP (CLINITEK) (Completed)   Other Visit Diagnoses     Seasonal allergic rhinitis, unspecified trigger       Relevant Medications   cetirizine (ZYRTEC) 10 MG tablet       No follow-ups on file.      Meds ordered this encounter  Medications   sertraline (ZOLOFT) 50 MG tablet    Sig: Take 1 tablet (50 mg total) by mouth daily.    Dispense:  90 tablet    Refill:  0   cephALEXin (KEFLEX) 500 MG capsule    Sig: Take 1 capsule (500 mg total) by mouth 2 (two) times daily for 7 days.    Dispense:  14 capsule    Refill:  0   cetirizine (ZYRTEC) 10 MG tablet    Sig: Take 1 tablet (10 mg total) by mouth daily.    Dispense:  90 tablet    Refill:  3    Orders Placed This Encounter  Procedures   POCT URINALYSIS DIP (CLINITEK)  Owens Loffler, DO  [place] [facility] [phone]  (phone) [phone] (fax)  [place]

## 2022-07-28 ENCOUNTER — Ambulatory Visit (INDEPENDENT_AMBULATORY_CARE_PROVIDER_SITE_OTHER): Payer: Medicare HMO | Admitting: Family Medicine

## 2022-07-28 DIAGNOSIS — Z0000 Encounter for general adult medical examination without abnormal findings: Secondary | ICD-10-CM | POA: Diagnosis not present

## 2022-07-28 NOTE — Progress Notes (Signed)
MEDICARE ANNUAL WELLNESS VISIT  07/28/2022  Telephone Visit Disclaimer This Medicare AWV was conducted by telephone due to national recommendations for restrictions regarding the COVID-19 Pandemic (e.g. social distancing).  I verified, using two identifiers, that I am speaking with Beth [name] or their authorized healthcare agent. I discussed the limitations, risks, security, and privacy concerns of performing an evaluation and management service by telephone and the potential availability of an in-person appointment in the future. The patient expressed understanding and agreed to proceed.  Location of Patient: Home Location of Provider (nurse):  Provider home  Subjective:    Beth [name] is a 89 y.o. female patient of Metheney, Rene Kocher, MD who had a Medicare Annual Wellness Visit today via telephone. Omari is Retired and lives alone. she has 1 child. she reports that she is socially active and does interact with friends/family regularly. she is minimally physically active and enjoys work on puzzles, genealogy and word searches.  Patient Care Team: Hali Marry, MD as PCP - Patric Dykes, MD as Consulting Physician (Urology) [facility] as Referring Physician     07/28/2022   10:34 AM 07/22/2021   10:18 AM 07/13/2020    1:06 PM 09/28/2016    4:42 PM 07/25/2016    3:16 PM 08/02/2015    2:42 PM 01/16/2014    6:22 AM  Advanced Directives  Does Patient Have a Medical Advance Directive? Yes Yes Yes No No Yes Yes  Type of Advance Directive Living will Living will [place];Living will   [place] will  Does patient want to make changes to medical advance directive? No - Patient declined No - Patient declined No - Patient declined    No - Patient declined  Copy of [place] in Chart?   No - copy requested      Would patient like information on creating a medical advance directive?        No - patient declined information    Hospital Utilization Over the Past 12 Months: # of hospitalizations or ER visits: 0 # of surgeries: 0  Review of Systems    Patient reports that her overall health is unchanged compared to last year.  History obtained from chart review and the patient  Patient Reported Readings (BP, Pulse, CBG, Weight, etc) none  Pain Assessment Pain : No/denies pain     Current Medications & Allergies (verified) Allergies as of 07/28/2022       Reactions   Doxycycline Other (See Comments)   Blisters on mouth and lips   Lunesta [eszopiclone] Other (See Comments)   Confused mentally   Pravachol [pravastatin]    Fatigued.    Trazodone And Nefazodone Other (See Comments)   Made her hyper   Zocor [simvastatin] Other (See Comments)   Myalgias and memory loss        Medication List        Accurate as of July 28, 2022 10:47 AM. If you have any questions, ask your nurse or doctor.          albuterol 108 (90 Base) MCG/ACT inhaler Commonly known as: VENTOLIN HFA Inhale 2 puffs into the lungs every 6 (six) hours as needed for wheezing or shortness of breath.   calcium carbonate 1250 MG capsule Take 1,250 mg by mouth daily.   cetirizine 10 MG tablet Commonly known as: ZYRTEC Take 1 tablet (10 mg total) by mouth daily.   cholecalciferol  25 MCG (1000 UT) tablet Generic drug: Cholecalciferol Take 1,000 Units by mouth daily.   DayVigo 5 MG Tabs Generic drug: Lemborexant TAKE 1 TABLET BY MOUTH ONCE DAILY AT BEDTIME   diltiazem 60 MG 12 hr capsule Commonly known as: CARDIZEM SR Take 1 capsule by mouth twice daily   fluticasone 110 MCG/ACT inhaler Commonly known as: Flovent HFA Inhale 2 puffs into the lungs in the morning and at bedtime.   ibuprofen 200 MG tablet Commonly known as: ADVIL Take 200 mg by mouth every 6 (six) hours as needed.   omeprazole 40 MG capsule Commonly known as: PRILOSEC Take 1 capsule (40 mg total) by mouth  daily.   predniSONE 20 MG tablet Commonly known as: DELTASONE Take 2 tablets (40 mg total) by mouth daily with breakfast.   sertraline 50 MG tablet Commonly known as: ZOLOFT Take 1 tablet (50 mg total) by mouth daily.   vitamin B-12 100 MCG tablet Commonly known as: CYANOCOBALAMIN Take 100 mcg by mouth daily.   vitamin B-6 500 MG tablet Take 500 mg by mouth daily.        History (reviewed): Past Medical History:  Diagnosis Date   Allergy    Anxiety    situational   Arthritis    Asthma    Cataract    COPD (chronic obstructive pulmonary disease)    PER PT   Diverticulitis    Diverticulitis    Emphysema of lung    Esophageal dysmotility    Fibromyalgia    Hiatal hernia    Hyperlipidemia    Hypertension    Insomnia    Osteopenia    UTI (lower urinary tract infection)    Past Surgical History:  Procedure Laterality Date   ABDOMINAL HYSTERECTOMY     BLADDER SURGERY     tac   COLONOSCOPY     2008   Family History  Problem Relation Age of Onset   Cancer Mother        breast   Hyperlipidemia Mother    Hypertension Mother    Dementia Mother    Heart disease Father        MI   Depression Father    Hyperlipidemia Father    Colon cancer Neg Hx    Esophageal cancer Neg Hx    Stomach cancer Neg Hx    Rectal cancer Neg Hx    Social History   Socioeconomic History   Marital status: Widowed    Spouse name: Not on file   Number of children: 2   Years of education: 12th   Highest education level: High school graduate  Occupational History   Occupation: retired  Tobacco Use   Smoking status: Former   Smokeless tobacco: Never   Tobacco comments:    smoker as a teenager  Scientific laboratory technician Use: Never used  Substance and Sexual Activity   Alcohol use: No    Alcohol/week: 0.0 standard drinks of alcohol    Comment: occasionally   Drug use: No   Sexual activity: Not on file  Other Topics Concern   Not on file  Social History Narrative   Lives alone.  She lost her son two years ago. Her daughter lives across town and calls her mom everyday. Likes to work on puzzles, word searches and read about history in her free time. Likes to do family research.   Social Determinants of Health   Financial Resource Strain: Low Risk  (07/28/2022)   Overall Financial Resource  Strain (CARDIA)    Difficulty of Paying Living Expenses: Not hard at all  Food Insecurity: No Food Insecurity (07/28/2022)   Hunger Vital Sign    Worried About Running Out of Food in the Last Year: Never true    Ran Out of Food in the Last Year: Never true  Transportation Needs: No Transportation Needs (07/28/2022)   PRAPARE - Hydrologist (Medical): No    Lack of Transportation (Non-Medical): No  Physical Activity: Inactive (07/28/2022)   Exercise Vital Sign    Days of Exercise per Week: 0 days    Minutes of Exercise per Session: 0 min  Stress: No Stress Concern Present (07/28/2022)   Wells    Feeling of Stress : Only a little  Social Connections: Socially Isolated (07/28/2022)   Social Connection and Isolation Panel [NHANES]    Frequency of Communication with Friends and Family: More than three times a week    Frequency of Social Gatherings with Friends and Family: Twice a week    Attends Religious Services: Never    Marine scientist or Organizations: No    Attends Archivist Meetings: Never    Marital Status: Widowed    Activities of Daily Living    07/28/2022   10:36 AM  In your present state of health, do you have any difficulty performing the following activities:  Hearing? 1  Comment some hearing loss  Vision? 0  Difficulty concentrating or making decisions? 0  Walking or climbing stairs? 0  Dressing or bathing? 0  Doing errands, shopping? 0  Preparing Food and eating ? N  Using the Toilet? N  In the past six months, have you accidently leaked urine? N  Do  you have problems with loss of bowel control? N  Managing your Medications? N  Managing your Finances? N  Housekeeping or managing your Housekeeping? N    Patient Education/ Literacy How often do you need to have someone help you when you read instructions, pamphlets, or other written materials from your doctor or pharmacy?: 1 - Never What is the last grade level you completed in school?: 12th grade  Exercise Current Exercise Habits: Home exercise routine, Type of exercise: walking, Time (Minutes): 20, Frequency (Times/Week): 5, Weekly Exercise (Minutes/Week): 100, Intensity: Mild, Exercise limited by: None identified  Diet Patient reports consuming 2 meals a day and 3 snack(s) a day Patient reports that her primary diet is: Regular Patient reports that she does have regular access to food.   Depression Screen    07/28/2022   10:34 AM 01/01/2022   12:01 PM 10/18/2021    3:27 PM 10/18/2021    2:39 PM 07/22/2021   10:18 AM 07/19/2020   11:24 AM 07/13/2020    1:06 PM  PHQ 2/9 Scores  PHQ - 2 Score 0 0 0 0 0 2 0  PHQ- 9 Score   4   8 0     Fall Risk    07/28/2022   10:34 AM 01/01/2022   12:00 PM 10/18/2021    3:26 PM 10/18/2021    2:39 PM 07/22/2021   10:18 AM  Fall Risk   Falls in the past year? 0 0 1 0 0  Number falls in past yr: 0 0 0 0 0  Injury with Fall? 0 0 0 0 0  Risk for fall due to : No Fall Risks No Fall Risks No Fall Risks No  Fall Risks No Fall Risks  Follow up Falls evaluation completed Falls evaluation completed Falls prevention discussed Falls prevention discussed;Falls evaluation completed Falls evaluation completed     Objective:  Beth [name] seemed alert and oriented and she participated appropriately during our telephone visit.  Blood Pressure Weight BMI  BP Readings from Last 3 Encounters:  07/15/22 (!) 172/86  06/12/22 (!) 158/73  05/06/22 136/70   Wt Readings from Last 3 Encounters:  07/15/22 162 lb (73.5 kg)  05/06/22 161 lb (73 kg)  02/13/22 160 lb  6.4 oz (72.8 kg)   BMI Readings from Last 1 Encounters:  07/15/22 25.37 kg/m    *Unable to obtain current vital signs, weight, and BMI due to telephone visit type  Hearing/Vision  Razan did not seem to have difficulty with hearing/understanding during the telephone conversation Reports that she has had a formal eye exam by an eye care professional within the past year Reports that she has not had a formal hearing evaluation within the past year *Unable to fully assess hearing and vision during telephone visit type  Cognitive Function:    07/28/2022   10:43 AM 07/22/2021   10:28 AM 07/13/2020    1:14 PM  6CIT Screen  What Year? 0 points 0 points 0 points  What month? 0 points 0 points 0 points  What time? 0 points 0 points 0 points  Count back from 20 0 points 0 points 0 points  Months in reverse 0 points 0 points 0 points  Repeat phrase 0 points 4 points 0 points  Total Score 0 points 4 points 0 points   (Normal:0-7, Significant for Dysfunction: >8)  Normal Cognitive Function Screening: Yes   Immunization & Health Maintenance Record Immunization History  Administered Date(s) Administered   Fluad Quad(high Dose 65+) 01/27/2019, 01/31/2020, 01/30/2021   Influenza Split 02/18/2012   Influenza Whole 05/04/2009   Influenza, High Dose Seasonal PF 02/04/2017, 02/04/2018   Influenza,inj,Quad PF,6+ Mos 02/25/2013, 05/30/2015, 12/27/2015   Influenza-Unspecified 02/21/2014   PFIZER(Purple Top)SARS-COV-2 Vaccination 06/24/2019, 07/20/2019, 02/18/2020   PNEUMOCOCCAL CONJUGATE-20 01/01/2022   Pneumococcal Conjugate-13 07/17/2016   Pneumococcal Polysaccharide-23 07/03/2004   Tdap 02/18/2011   Zoster, Live 02/21/2013    Health Maintenance  Topic Date Due   DTaP/Tdap/Td (2 - Td or Tdap) 02/17/2021   COVID-19 Vaccine (4 - 2023-24 season) 05/23/2023 (Originally 12/27/2021)   Zoster Vaccines- Shingrix (1 of 2) 08/05/2023 (Originally 05/23/1984)   INFLUENZA VACCINE  11/27/2022   Medicare  Annual Wellness (AWV)  07/28/2023   Pneumonia Vaccine 49+ Years old  Completed   DEXA SCAN  Completed   HPV VACCINES  Aged Out       Assessment  This is a routine wellness examination for [employer]  Health Maintenance: Due or Overdue Health Maintenance Due  Topic Date Due   DTaP/Tdap/Td (2 - Td or Tdap) 02/17/2021    Beth [name] does not need a referral for Community Assistance: Care Management:   no Social Work:    no Prescription Assistance:  no Nutrition/Diabetes Education:  no   Plan:  Personalized Goals  Goals Addressed               This Visit's Progress     Patient Stated (pt-stated)        Patient stated that she would like to be able to continue to maintain her lifestyle       Personalized Health Maintenance & Screening Recommendations  Td vaccine Shingles vaccine Bone density scan-  declined  Lung Cancer Screening Recommended: no (Low Dose CT Chest recommended if Age 14-80 years, 30 pack-year currently smoking OR have quit w/in past 15 years) Hepatitis C Screening recommended: no HIV Screening recommended: no  Advanced Directives: Written information was not prepared per patient's request.  Referrals & Orders No orders of the defined types were placed in this encounter.   Follow-up Plan Follow-up with Hali Marry, MD as planned Schedule tetanus and shingles vaccine at the pharmacy. Medicare wellness visit in one year.  AVS printed and mailed to the patient.   I have personally reviewed and noted the following in the patient's chart:   Medical and social history Use of alcohol, tobacco or illicit drugs  Current medications and supplements Functional ability and status Nutritional status Physical activity Advanced directives List of other physicians Hospitalizations, surgeries, and ER visits in previous 12 months Vitals Screenings to include cognitive, depression, and falls Referrals and appointments  In addition, I  have reviewed and discussed with Beth [name] certain preventive protocols, quality metrics, and best practice recommendations. A written personalized care plan for preventive services as well as general preventive health recommendations is available and can be mailed to the patient at her request.      Tinnie Gens, RN BSN  07/28/2022

## 2022-07-28 NOTE — Patient Instructions (Signed)
[place] Maintenance Summary and Written Plan of Care  Ms. Beth [name] ,  Thank you for allowing me to perform your Medicare Annual Wellness Visit and for your ongoing commitment to your health.   Health Maintenance & Immunization History Health Maintenance  Topic Date Due   DTaP/Tdap/Td (2 - Td or Tdap) 02/17/2021   COVID-19 Vaccine (4 - 2023-24 season) 05/23/2023 (Originally 12/27/2021)   Zoster Vaccines- Shingrix (1 of 2) 08/05/2023 (Originally 05/23/1984)   INFLUENZA VACCINE  11/27/2022   Medicare Annual Wellness (AWV)  07/28/2023   Pneumonia Vaccine 62+ Years old  Completed   DEXA SCAN  Completed   HPV VACCINES  Aged Out   Immunization History  Administered Date(s) Administered   Fluad Quad(high Dose 65+) 01/27/2019, 01/31/2020, 01/30/2021   Influenza Split 02/18/2012   Influenza Whole 05/04/2009   Influenza, High Dose Seasonal PF 02/04/2017, 02/04/2018   Influenza,inj,Quad PF,6+ Mos 02/25/2013, 05/30/2015, 12/27/2015   Influenza-Unspecified 02/21/2014   PFIZER(Purple Top)SARS-COV-2 Vaccination 06/24/2019, 07/20/2019, 02/18/2020   PNEUMOCOCCAL CONJUGATE-20 01/01/2022   Pneumococcal Conjugate-13 07/17/2016   Pneumococcal Polysaccharide-23 07/03/2004   Tdap 02/18/2011   Zoster, Live 02/21/2013    These are the patient goals that we discussed:  Goals Addressed               This Visit's Progress     Patient Stated (pt-stated)        Patient stated that she would like to be able to continue to maintain her lifestyle         This is a list of Health Maintenance Items that are overdue or due now: Health Maintenance Due  Topic Date Due   DTaP/Tdap/Td (2 - Td or Tdap) 02/17/2021  Shingles vaccine Bone density scan- declined  Orders/Referrals Placed Today: No orders of the defined types were placed in this encounter.  (Contact our referral department at [phone] if you have not spoken with someone about your referral appointment  within the next 5 days)    Follow-up Plan Follow-up with Hali Marry, MD as planned Schedule tetanus and shingles vaccine at the pharmacy. Medicare wellness visit in one year.  AVS printed and mailed to the patient.      Health Maintenance, Female Adopting a healthy lifestyle and getting preventive care are important in promoting health and wellness. Ask your health care provider about: The right schedule for you to have regular tests and exams. Things you can do on your own to prevent diseases and keep yourself healthy. What should I know about diet, weight, and exercise? Eat a healthy diet  Eat a diet that includes plenty of vegetables, fruits, low-fat dairy products, and lean protein. Do not eat a lot of foods that are high in solid fats, added sugars, or sodium. Maintain a healthy weight Body mass index (BMI) is used to identify weight problems. It estimates body fat based on height and weight. Your health care provider can help determine your BMI and help you achieve or maintain a healthy weight. Get regular exercise Get regular exercise. This is one of the most important things you can do for your health. Most adults should: Exercise for at least 150 minutes each week. The exercise should increase your heart rate and make you sweat (moderate-intensity exercise). Do strengthening exercises at least twice a week. This is in addition to the moderate-intensity exercise. Spend less time sitting. Even light physical activity can be beneficial. Watch cholesterol and blood lipids Have your blood tested for lipids and  cholesterol at 87 years of age, then have this test every 5 years. Have your cholesterol levels checked more often if: Your lipid or cholesterol levels are high. You are older than 87 years of age. You are at high risk for heart disease. What should I know about cancer screening? Depending on your health history and family history, you may need to have cancer  screening at various ages. This may include screening for: Breast cancer. Cervical cancer. Colorectal cancer. Skin cancer. Lung cancer. What should I know about heart disease, diabetes, and high blood pressure? Blood pressure and heart disease High blood pressure causes heart disease and increases the risk of stroke. This is more likely to develop in people who have high blood pressure readings or are overweight. Have your blood pressure checked: Every 3-5 years if you are 16-5 years of age. Every year if you are 11 years old or older. Diabetes Have regular diabetes screenings. This checks your fasting blood sugar level. Have the screening done: Once every three years after age 48 if you are at a normal weight and have a low risk for diabetes. More often and at a younger age if you are overweight or have a high risk for diabetes. What should I know about preventing infection? Hepatitis B If you have a higher risk for hepatitis B, you should be screened for this virus. Talk with your health care provider to find out if you are at risk for hepatitis B infection. Hepatitis C Testing is recommended for: Everyone born from [birth year] through 1963. Anyone with known risk factors for hepatitis C. Sexually transmitted infections (STIs) Get screened for STIs, including gonorrhea and chlamydia, if: You are sexually active and are younger than 87 years of age. You are older than 87 years of age and your health care provider tells you that you are at risk for this type of infection. Your sexual activity has changed since you were last screened, and you are at increased risk for chlamydia or gonorrhea. Ask your health care provider if you are at risk. Ask your health care provider about whether you are at high risk for HIV. Your health care provider may recommend a prescription medicine to help prevent HIV infection. If you choose to take medicine to prevent HIV, you should first get tested for HIV. You  should then be tested every 3 months for as long as you are taking the medicine. Pregnancy If you are about to stop having your period (premenopausal) and you may become pregnant, seek counseling before you get pregnant. Take 400 to 800 micrograms (mcg) of folic acid every day if you become pregnant. Ask for birth control (contraception) if you want to prevent pregnancy. Osteoporosis and menopause Osteoporosis is a disease in which the bones lose minerals and strength with aging. This can result in bone fractures. If you are 2 years old or older, or if you are at risk for osteoporosis and fractures, ask your health care provider if you should: Be screened for bone loss. Take a calcium or vitamin D supplement to lower your risk of fractures. Be given hormone replacement therapy (HRT) to treat symptoms of menopause. Follow these instructions at home: Alcohol use Do not drink alcohol if: Your health care provider tells you not to drink. You are pregnant, may be pregnant, or are planning to become pregnant. If you drink alcohol: Limit how much you have to: 0-1 drink a day. Know how much alcohol is in your drink. In the [place]  one drink equals one 12 oz bottle of beer (355 mL), one 5 oz glass of wine (148 mL), or one 1 oz glass of hard liquor (44 mL). Lifestyle Do not use any products that contain nicotine or tobacco. These products include cigarettes, chewing tobacco, and vaping devices, such as e-cigarettes. If you need help quitting, ask your health care provider. Do not use street drugs. Do not share needles. Ask your health care provider for help if you need support or information about quitting drugs. General instructions Schedule regular health, dental, and eye exams. Stay current with your vaccines. Tell your health care provider if: You often feel depressed. You have ever been abused or do not feel safe at home. Summary Adopting a healthy lifestyle and getting preventive care are  important in promoting health and wellness. Follow your health care provider's instructions about healthy diet, exercising, and getting tested or screened for diseases. Follow your health care provider's instructions on monitoring your cholesterol and blood pressure. This information is not intended to replace advice given to you by your health care provider. Make sure you discuss any questions you have with your health care provider. Document Revised: 09/03/2020 Document Reviewed: 09/03/2020 Elsevier Patient Education  [place].

## 2022-07-29 ENCOUNTER — Ambulatory Visit: Payer: Medicare HMO | Admitting: Family Medicine

## 2022-07-30 ENCOUNTER — Ambulatory Visit (INDEPENDENT_AMBULATORY_CARE_PROVIDER_SITE_OTHER): Payer: Medicare HMO | Admitting: Family Medicine

## 2022-07-30 ENCOUNTER — Encounter: Payer: Self-pay | Admitting: Family Medicine

## 2022-07-30 VITALS — BP 143/81 | HR 83 | Temp 98.2°F | Ht 67.0 in | Wt 159.3 lb

## 2022-07-30 DIAGNOSIS — N3000 Acute cystitis without hematuria: Secondary | ICD-10-CM

## 2022-07-30 LAB — POCT URINALYSIS DIP (CLINITEK)
Bilirubin, UA: NEGATIVE
Blood, UA: NEGATIVE
Glucose, UA: NEGATIVE mg/dL
Leukocytes, UA: NEGATIVE
Nitrite, UA: NEGATIVE
POC PROTEIN,UA: NEGATIVE
Spec Grav, UA: 1.02 (ref 1.010–1.025)
Urobilinogen, UA: 1 E.U./dL
pH, UA: 6.5 (ref 5.0–8.0)

## 2022-07-30 MED ORDER — FOSFOMYCIN TROMETHAMINE 3 G PO PACK: 3.0000 g | PACK | Freq: Once | ORAL | 0 refills | Status: DC

## 2022-07-30 NOTE — Progress Notes (Signed)
Established Patient Office Visit  Subjective   Patient ID: Beth [name], female    DOB: 20-Jun-1932  Age: 89 y.o. MRN: [id number]  Chief Complaint  Patient presents with   Headache   Generalized Body Aches   Urinary Tract Infection    Pt was treated for UTI symptoms on 07/15/2022. She has d/c'd Cephalexin due to diarrhea. She was only able to take 6 tablets before stopping ABX. She still complains of lower back pain.     Cough    HPI Presents today for an acute visit with complaint of UTI infection on 07/15/22 was prescribed keflex and she stopped it due  getting diarrhea. She did not notify her PCP that she stopped this antibiotic.  Symptoms include  some urinary burning- not too much.  Symptoms have been present  since 07/15/22 Associated symptoms include: "felt like I had fever" reports no fever with checking it.  Pertinent negatives: no visible blood in urine.  Pain severity: 0/10  Treatments tried include : Keflex, stopped due to diarrhea- she did not notify PCP that she stopped the antibiotic.  Treatment effective : continues to have symptoms  Has been dealing with this and feels "disgusted"  Has been getting UTI's frequently. Sees urology, has not seen in a while   Chart review: 07/15/22 for urinary frequency, treated with Keflex.   Reports several chronic conditions today, encouraged her to speak with her PCP as this is an acute visit for possible UTI.    Review of Systems  Genitourinary:  Positive for dysuria. Negative for hematuria.      Objective:     BP (!) 143/81   Pulse 83   Temp 98.2 F (36.8 C) (Oral)   Ht 5\' 7"  (1.702 m)   Wt 159 lb 4.8 oz (72.3 kg)   SpO2 94%   BMI 24.95 kg/m  BP Readings from Last 3 Encounters:  07/30/22 (!) 143/81  07/15/22 (!) 172/86  06/12/22 (!) 158/73      Physical Exam Vitals and nursing note reviewed.  Constitutional:      General: She is not in acute distress.    Appearance: She is well-developed.  Cardiovascular:      Rate and Rhythm: Normal rate.  Pulmonary:     Effort: Pulmonary effort is normal.  Abdominal:     Tenderness: There is no right CVA tenderness or left CVA tenderness.  Skin:    General: Skin is warm and dry.     Capillary Refill: Capillary refill takes less than 2 seconds.  Neurological:     General: No focal deficit present.     Mental Status: She is alert. Mental status is at baseline.  Psychiatric:        Mood and Affect: Mood normal.        Behavior: Behavior normal.        Thought Content: Thought content normal.        Judgment: Judgment normal.     Results for orders placed or performed in visit on 07/30/22  POCT URINALYSIS DIP (CLINITEK)  Result Value Ref Range   Color, UA yellow yellow   Clarity, UA clear clear   Glucose, UA negative negative mg/dL   Bilirubin, UA negative negative   Ketones, POC UA trace (5) (A) negative mg/dL   Spec Grav, UA 1.020 1.010 - 1.025   Blood, UA negative negative   pH, UA 6.5 5.0 - 8.0   POC PROTEIN,UA negative negative, trace   Urobilinogen, UA  1.0 0.2 or 1.0 E.U./dL   Nitrite, UA Negative Negative   Leukocytes, UA Negative Negative      The ASCVD Risk score (Arnett DK, et al., 2019) failed to calculate for the following reasons:   The 2019 ASCVD risk score is only valid for ages 43 to 32    Assessment & Plan:   Problem List Items Addressed This Visit   None Visit Diagnoses     Acute cystitis without hematuria    -  Primary Diagnosed with UTI on 07/15/22. Did not complete treatment due to diarrhea. Symptoms today include minimal urinary symptoms. Some burning, no urgency or frequency. Urine dip today is negative for infection. Recommend follow-up with urology and PCP. Had multiple complaints today not related to urinary.  Encouraged patient to speak with her PCP about these other concerns. She agrees to speak with PCP.    Relevant Orders   POCT URINALYSIS DIP (CLINITEK) (Completed)     Agrees with plan of care  discussed.  Questions answered. Patient is relieved she does not have UTI today.    Return for follow-up with PCP .    Chalmers Guest, FNP

## 2022-08-18 ENCOUNTER — Ambulatory Visit: Payer: Medicare HMO | Admitting: Family Medicine

## 2022-08-26 ENCOUNTER — Ambulatory Visit (INDEPENDENT_AMBULATORY_CARE_PROVIDER_SITE_OTHER): Payer: Medicare HMO | Admitting: Family Medicine

## 2022-08-26 ENCOUNTER — Encounter: Payer: Self-pay | Admitting: Family Medicine

## 2022-08-26 VITALS — BP 133/83 | HR 79 | Ht 67.0 in | Wt 159.0 lb

## 2022-08-26 DIAGNOSIS — J683 Other acute and subacute respiratory conditions due to chemicals, gases, fumes and vapors: Secondary | ICD-10-CM

## 2022-08-26 DIAGNOSIS — G4700 Insomnia, unspecified: Secondary | ICD-10-CM | POA: Diagnosis not present

## 2022-08-26 DIAGNOSIS — R21 Rash and other nonspecific skin eruption: Secondary | ICD-10-CM

## 2022-08-26 MED ORDER — DAYVIGO 5 MG PO TABS: 1.0000 | ORAL_TABLET | Freq: Every day | ORAL | 2 refills | Status: DC

## 2022-08-26 MED ORDER — NYSTATIN 100000 UNIT/GM EX OINT: 1.0000 | TOPICAL_OINTMENT | Freq: Two times a day (BID) | CUTANEOUS | 2 refills | Status: DC

## 2022-08-26 MED ORDER — NYSTATIN 100000 UNIT/GM EX POWD: 1.0000 | Freq: Three times a day (TID) | CUTANEOUS | 0 refills | Status: DC

## 2022-08-26 NOTE — Progress Notes (Signed)
Established Patient Office Visit  Subjective   Patient ID: Beth [name], female    DOB: [birth year]  Age: 89 y.o. MRN: [id number]  Chief Complaint  Patient presents with   Follow-up         HPI  Wants to go over her medication and make sure she know what they are fo.  She is not taking the prednisone or the B6.  She does feel like she is struggling with her memory a little bit more.  She also has a rash along her lower abdomen just under the pannus crease.  She says she has been using some over-the-counter baby rash ointment it helps a little bit but it has not gone completely away and the skin is starting to crack.  She has been using a washcloth to separate the skin.    ROS    Objective:     BP 133/83   Pulse 79   Ht 5\' 7"  (1.702 m)   Wt 159 lb (72.1 kg)   SpO2 94%   BMI 24.90 kg/m    Physical Exam Vitals and nursing note reviewed.  Constitutional:      Appearance: She is well-developed.  HENT:     Head: Normocephalic and atraumatic.  Cardiovascular:     Rate and Rhythm: Normal rate and regular rhythm.     Heart sounds: Normal heart sounds.  Pulmonary:     Effort: Pulmonary effort is normal.     Breath sounds: Normal breath sounds.     Comments: Slight wheeze in the right anterior chest. Skin:    General: Skin is warm and dry.  Neurological:     Mental Status: She is alert and oriented to person, place, and time.  Psychiatric:        Behavior: Behavior normal.      No results found for any visits on 08/26/22.    The ASCVD Risk score (Arnett DK, et al., 2019) failed to calculate for the following reasons:   The 2019 ASCVD risk score is only valid for ages 10 to 23    Assessment & Plan:   Problem List Items Addressed This Visit       Respiratory   Reactive airways dysfunction syndrome (HCC)    She has had a little bit more cough and phlegm production recently.  She is only using her Flovent once a day and sometimes misses it completely.   Encouraged her to increase her Flovent to twice a day especially through the heavy pollen season to see if that helps but if she is not improving then please let us know.        Other   Insomnia    Has been really happy with the [facility] and feels like it has been helpful for her sleep.  She says when she does not sleep she tends to get up and eat in the middle the night.      Relevant Medications   Lemborexant (DAYVIGO) 5 MG TABS   Other Visit Diagnoses     Rash    -  Primary   Relevant Medications   nystatin (MYCOSTATIN/NYSTOP) powder   nystatin ointment (MYCOSTATIN)      We did go through the list of medications and I wrote down the indication for each 1 beside the medication so that she could have that at home.  Rash - She is most consistent with yeast.  Will treat with nystatin ointment and also gave her the powder to use  as we get into the summer and it gets a little warmer.  If the rash is not completely clearing up then please let me know.  She has a couple skin lesions on her face.  A couple are more crusty and red and that she has 1 that is more white and raised that is actually near an old skin cancer that was removed near the right nasolabial fold.  I encouraged her to get in with her dermatologist.  She says she will think about it.  Return in about 6 months (around 02/25/2023) for sleep and mood .    Nani Gasser, MD

## 2022-08-26 NOTE — Progress Notes (Signed)
Pt states that she is not experiencing any burning, urgency, or pain with urination.   She would like to have [place] notate beside each of her medications what she is taking them for.

## 2022-08-26 NOTE — Assessment & Plan Note (Signed)
She has had a little bit more cough and phlegm production recently.  She is only using her Flovent once a day and sometimes misses it completely.  Encouraged her to increase her Flovent to twice a day especially through the heavy pollen season to see if that helps but if she is not improving then please let us know.

## 2022-08-26 NOTE — Patient Instructions (Signed)
Please consider switching your nighttime ibuprofen to Tylenol.  It is a safer long-term option. For the rash in your groin and get a send over a cream and powder.  I start with the cream treatment twice a day for 14 days.  If the rash starts to come back especially as we get into warmer weather then you can try the powder instead if you would like.

## 2022-08-26 NOTE — Assessment & Plan Note (Signed)
Has been really happy with the [facility] and feels like it has been helpful for her sleep.  She says when she does not sleep she tends to get up and eat in the middle the night.

## 2022-10-02 ENCOUNTER — Other Ambulatory Visit: Payer: Self-pay | Admitting: Family Medicine

## 2022-10-02 DIAGNOSIS — R252 Cramp and spasm: Secondary | ICD-10-CM

## 2022-10-08 ENCOUNTER — Other Ambulatory Visit: Payer: Self-pay | Admitting: Family Medicine

## 2022-10-08 DIAGNOSIS — R252 Cramp and spasm: Secondary | ICD-10-CM

## 2023-01-08 DIAGNOSIS — M1711 Unilateral primary osteoarthritis, right knee: Secondary | ICD-10-CM | POA: Diagnosis not present

## 2023-01-08 DIAGNOSIS — M19012 Primary osteoarthritis, left shoulder: Secondary | ICD-10-CM | POA: Diagnosis not present

## 2023-01-08 DIAGNOSIS — M170 Bilateral primary osteoarthritis of knee: Secondary | ICD-10-CM | POA: Diagnosis not present

## 2023-01-08 DIAGNOSIS — M1712 Unilateral primary osteoarthritis, left knee: Secondary | ICD-10-CM | POA: Diagnosis not present

## 2023-02-02 ENCOUNTER — Other Ambulatory Visit: Payer: Self-pay | Admitting: Family Medicine

## 2023-02-02 DIAGNOSIS — F411 Generalized anxiety disorder: Secondary | ICD-10-CM

## 2023-02-24 ENCOUNTER — Ambulatory Visit (INDEPENDENT_AMBULATORY_CARE_PROVIDER_SITE_OTHER): Payer: Medicare HMO | Admitting: Family Medicine

## 2023-02-24 ENCOUNTER — Encounter: Payer: Self-pay | Admitting: Family Medicine

## 2023-02-24 VITALS — BP 132/88 | HR 91 | Ht 67.0 in | Wt 156.0 lb

## 2023-02-24 DIAGNOSIS — Z23 Encounter for immunization: Secondary | ICD-10-CM | POA: Diagnosis not present

## 2023-02-24 DIAGNOSIS — G4700 Insomnia, unspecified: Secondary | ICD-10-CM | POA: Diagnosis not present

## 2023-02-24 DIAGNOSIS — J449 Chronic obstructive pulmonary disease, unspecified: Secondary | ICD-10-CM

## 2023-02-24 DIAGNOSIS — I10 Essential (primary) hypertension: Secondary | ICD-10-CM | POA: Diagnosis not present

## 2023-02-24 DIAGNOSIS — R252 Cramp and spasm: Secondary | ICD-10-CM | POA: Diagnosis not present

## 2023-02-24 DIAGNOSIS — G478 Other sleep disorders: Secondary | ICD-10-CM

## 2023-02-24 DIAGNOSIS — F320 Major depressive disorder, single episode, mild: Secondary | ICD-10-CM | POA: Diagnosis not present

## 2023-02-24 DIAGNOSIS — F411 Generalized anxiety disorder: Secondary | ICD-10-CM

## 2023-02-24 MED ORDER — DILTIAZEM HCL ER 60 MG PO CP12: 60.0000 mg | ORAL_CAPSULE | Freq: Two times a day (BID) | ORAL | 0 refills | Status: DC

## 2023-02-24 MED ORDER — DAYVIGO 5 MG PO TABS: 1.0000 | ORAL_TABLET | Freq: Every day | ORAL | 1 refills | Status: DC

## 2023-02-24 MED ORDER — OMEPRAZOLE 40 MG PO CPDR: 40.0000 mg | DELAYED_RELEASE_CAPSULE | Freq: Every day | ORAL | 3 refills | Status: AC

## 2023-02-24 NOTE — Assessment & Plan Note (Signed)
Feels like she is doing well on her current regimen of sertraline 50 mg and is happy with her dosing regimen we did discuss maybe coming down on her dose at some point.  But right now she just wants to keep it the way it is.  Follow-up in 6 months.

## 2023-02-24 NOTE — Progress Notes (Signed)
Established Patient Office Visit  Subjective   Patient ID: Beth [name], female    DOB: 11-11-1932  Age: 89 y.o. MRN: [id number]  Chief Complaint  Patient presents with   Hypertension   Insomnia    HPI  F/U GAD -she is happy with her current dosing regimen.     02/24/2023    1:25 PM 07/28/2022   10:34 AM 01/01/2022   12:01 PM 10/18/2021    3:27 PM 10/18/2021    2:39 PM  Depression screen PHQ 2/9  Decreased Interest 0 0 0 0 0  Down, Depressed, Hopeless 0 0 0 0 0  PHQ - 2 Score 0 0 0 0 0  Altered sleeping 1   3   Tired, decreased energy 1   1   Change in appetite 0   0   Feeling bad or failure about yourself  0   0   Trouble concentrating 0   0   Moving slowly or fidgety/restless 0   0   Suicidal thoughts 0   0   PHQ-9 Score 2   4   Difficult doing work/chores Not difficult at all   Not difficult at all   -9  F/U insomnia -ports that the [place] has actually been really helpful she says out of all the things that she is tried it has been the best.  She did not get great improvement with doxepin and trazodone and Lunesta called mental fogginess.  Hypertension- Pt denies chest pain, SOB, dizziness, or heart palpitations.  Taking meds as directed w/o problems.  Denies medication side effects.    Mild COPD - No recent flares   She has been having some popping in her right ear and occ sharp pain.  No cold sxs. She has had some mucous but not acute illness.       ROS    Objective:     BP 132/88   Pulse 91   Ht 5\' 7"  (1.702 m)   Wt 156 lb (70.8 kg)   SpO2 95%   BMI 24.43 kg/m    Physical Exam Vitals and nursing note reviewed.  Constitutional:      Appearance: Normal appearance.  HENT:     Head: Normocephalic and atraumatic.  Eyes:     Conjunctiva/sclera: Conjunctivae normal.  Cardiovascular:     Rate and Rhythm: Normal rate and regular rhythm.  Pulmonary:     Effort: Pulmonary effort is normal.     Breath sounds: Normal breath sounds.  Skin:    General:  Skin is warm and dry.  Neurological:     Mental Status: She is alert.  Psychiatric:        Mood and Affect: Mood normal.      No results found for any visits on 02/24/23.    The ASCVD Risk score (Arnett DK, et al., 2019) failed to calculate for the following reasons:   The 2019 ASCVD risk score is only valid for ages 55 to 61    Assessment & Plan:   Problem List Items Addressed This Visit       Cardiovascular and Mediastinum   HYPERTENSION, BENIGN    Well controlled. Continue current regimen. Follow up in  77mo       Relevant Medications   diltiazem (CARDIZEM SR) 60 MG 12 hr capsule   Other Relevant Orders   CMP14+EGFR   Lipid panel   CBC     Respiratory   COPD, mild (HCC)  No recent flares or exacerbations.  Report we do need to get her back in for spirometry at some point last 1 I have on file was from 2016.  At this point she is really not struggling with a lot of symptoms so I do not know that it would really change her current medication regimen.  She uses Flovent HFA and albuterol as needed.      Relevant Orders   CMP14+EGFR   Lipid panel   CBC     Other   Poor sleep pattern - Primary   Major depressive disorder, single episode, mild (HCC)    Stable on sertraline. She wishes family would visit more often. She feels like it it is her time to go she is OK. She feels she is existing.        Insomnia    She says that of the several things that she has tried the [employer] has actually been the best.  So she is happy with this particular regimen and does need refill sent today.      Relevant Medications   Lemborexant (DAYVIGO) 5 MG TABS   Generalized anxiety disorder    Feels like she is doing well on her current regimen of sertraline 50 mg and is happy with her dosing regimen we did discuss maybe coming down on her dose at some point.  But right now she just wants to keep it the way it is.  Follow-up in 6 months.      Other Visit Diagnoses     Encounter  for immunization       Relevant Orders   Flu Vaccine Trivalent High Dose (Fluad) (Completed)   Pfizer Comirnaty Covid-19 Vaccine 77yrs & older (Completed)   Muscle cramp       Relevant Medications   diltiazem (CARDIZEM SR) 60 MG 12 hr capsule       Return in about 6 months (around 08/25/2023) for Hypertension.    Nani Gasser, MD

## 2023-02-24 NOTE — Assessment & Plan Note (Signed)
Stable on sertraline. She wishes family would visit more often. She feels like it it is her time to go she is OK. She feels she is existing.

## 2023-02-24 NOTE — Assessment & Plan Note (Addendum)
No recent flares or exacerbations.  Report we do need to get her back in for spirometry at some point last 1 I have on file was from 2016.  At this point she is really not struggling with a lot of symptoms so I do not know that it would really change her current medication regimen.  She uses Flovent HFA and albuterol as needed.

## 2023-02-24 NOTE — Assessment & Plan Note (Signed)
Well controlled. Continue current regimen. Follow up in  6 mo  

## 2023-02-24 NOTE — Assessment & Plan Note (Signed)
She says that of the several things that she has tried the [employer] has actually been the best.  So she is happy with this particular regimen and does need refill sent today.

## 2023-02-25 ENCOUNTER — Other Ambulatory Visit: Payer: Self-pay | Admitting: Family Medicine

## 2023-02-25 LAB — LIPID PANEL
Chol/HDL Ratio: 4.8 ratio — ABNORMAL HIGH (ref 0.0–4.4)
Cholesterol, Total: 221 mg/dL — ABNORMAL HIGH (ref 100–199)
HDL: 46 mg/dL (ref >39)
LDL Chol Calc (NIH): 145 mg/dL — ABNORMAL HIGH (ref 0–99)
Triglycerides: 164 mg/dL — ABNORMAL HIGH (ref 0–149)
VLDL Cholesterol Cal: 30 mg/dL (ref 5–40)

## 2023-02-25 LAB — CMP14+EGFR
ALT: 8 [IU]/L (ref 0–32)
AST: 13 [IU]/L (ref 0–40)
Albumin: 4.2 g/dL (ref 3.7–4.7)
Alkaline Phosphatase: 74 [IU]/L (ref 44–121)
BUN/Creatinine Ratio: 23 (ref 12–28)
BUN: 15 mg/dL (ref 8–27)
Bilirubin Total: 0.4 mg/dL (ref 0.0–1.2)
CO2: 26 mmol/L (ref 20–29)
Calcium: 9.5 mg/dL (ref 8.7–10.3)
Chloride: 100 mmol/L (ref 96–106)
Creatinine, Ser: 0.66 mg/dL (ref 0.57–1.00)
Globulin, Total: 2.2 g/dL (ref 1.5–4.5)
Glucose: 109 mg/dL — ABNORMAL HIGH (ref 70–99)
Potassium: 4.2 mmol/L (ref 3.5–5.2)
Sodium: 142 mmol/L (ref 134–144)
Total Protein: 6.4 g/dL (ref 6.0–8.5)
eGFR: 84 mL/min/{1.73_m2} (ref >59)

## 2023-02-25 LAB — CBC
Hematocrit: 43 % (ref 34.0–46.6)
Hemoglobin: 14 g/dL (ref 11.1–15.9)
MCH: 29.9 pg (ref 26.6–33.0)
MCHC: 32.6 g/dL (ref 31.5–35.7)
MCV: 92 fL (ref 79–97)
Platelets: 217 10*3/uL (ref 150–450)
RBC: 4.68 x10E6/uL (ref 3.77–5.28)
RDW: 13.5 % (ref 11.7–15.4)
WBC: 5.8 10*3/uL (ref 3.4–10.8)

## 2023-02-25 NOTE — Progress Notes (Signed)
Hi Beth [name], your metabolic panel looks good.  Triglycerides are little elevated but much better compared than last year so great work.  LDL is also still elevated.  Continue to work on healthy diet and regular exercise.  Blood count is normal.

## 2023-02-27 IMAGING — DX DG CHEST 2V
2 series · 2 of 2 positions shown · non-contrast
Comparison: [DATE] [DATE], [DATE], [DATE] [DATE], [DATE]

CLINICAL DATA: 1 month history of cough

EXAM:
CHEST - 2 VIEW

[chest pa]
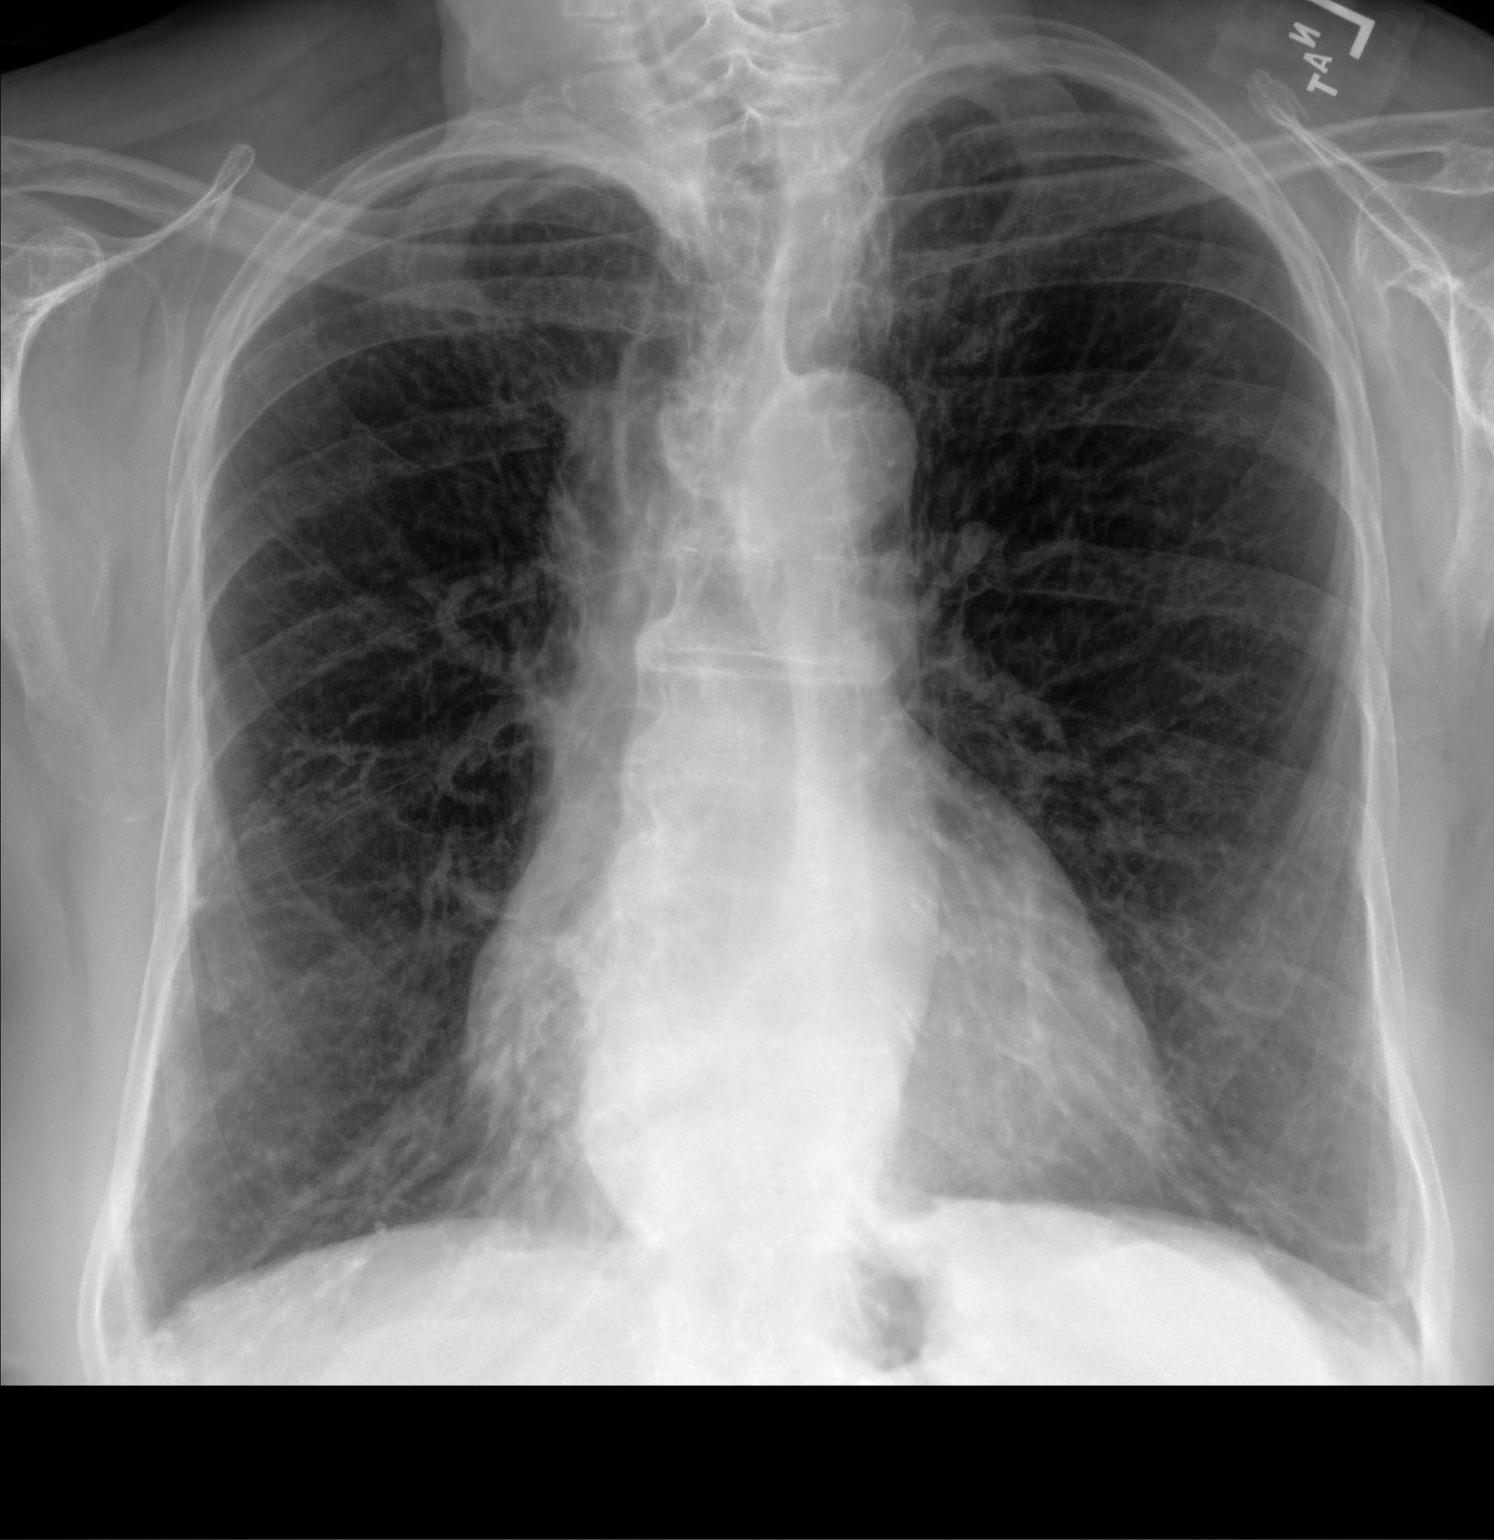

[chest lat]
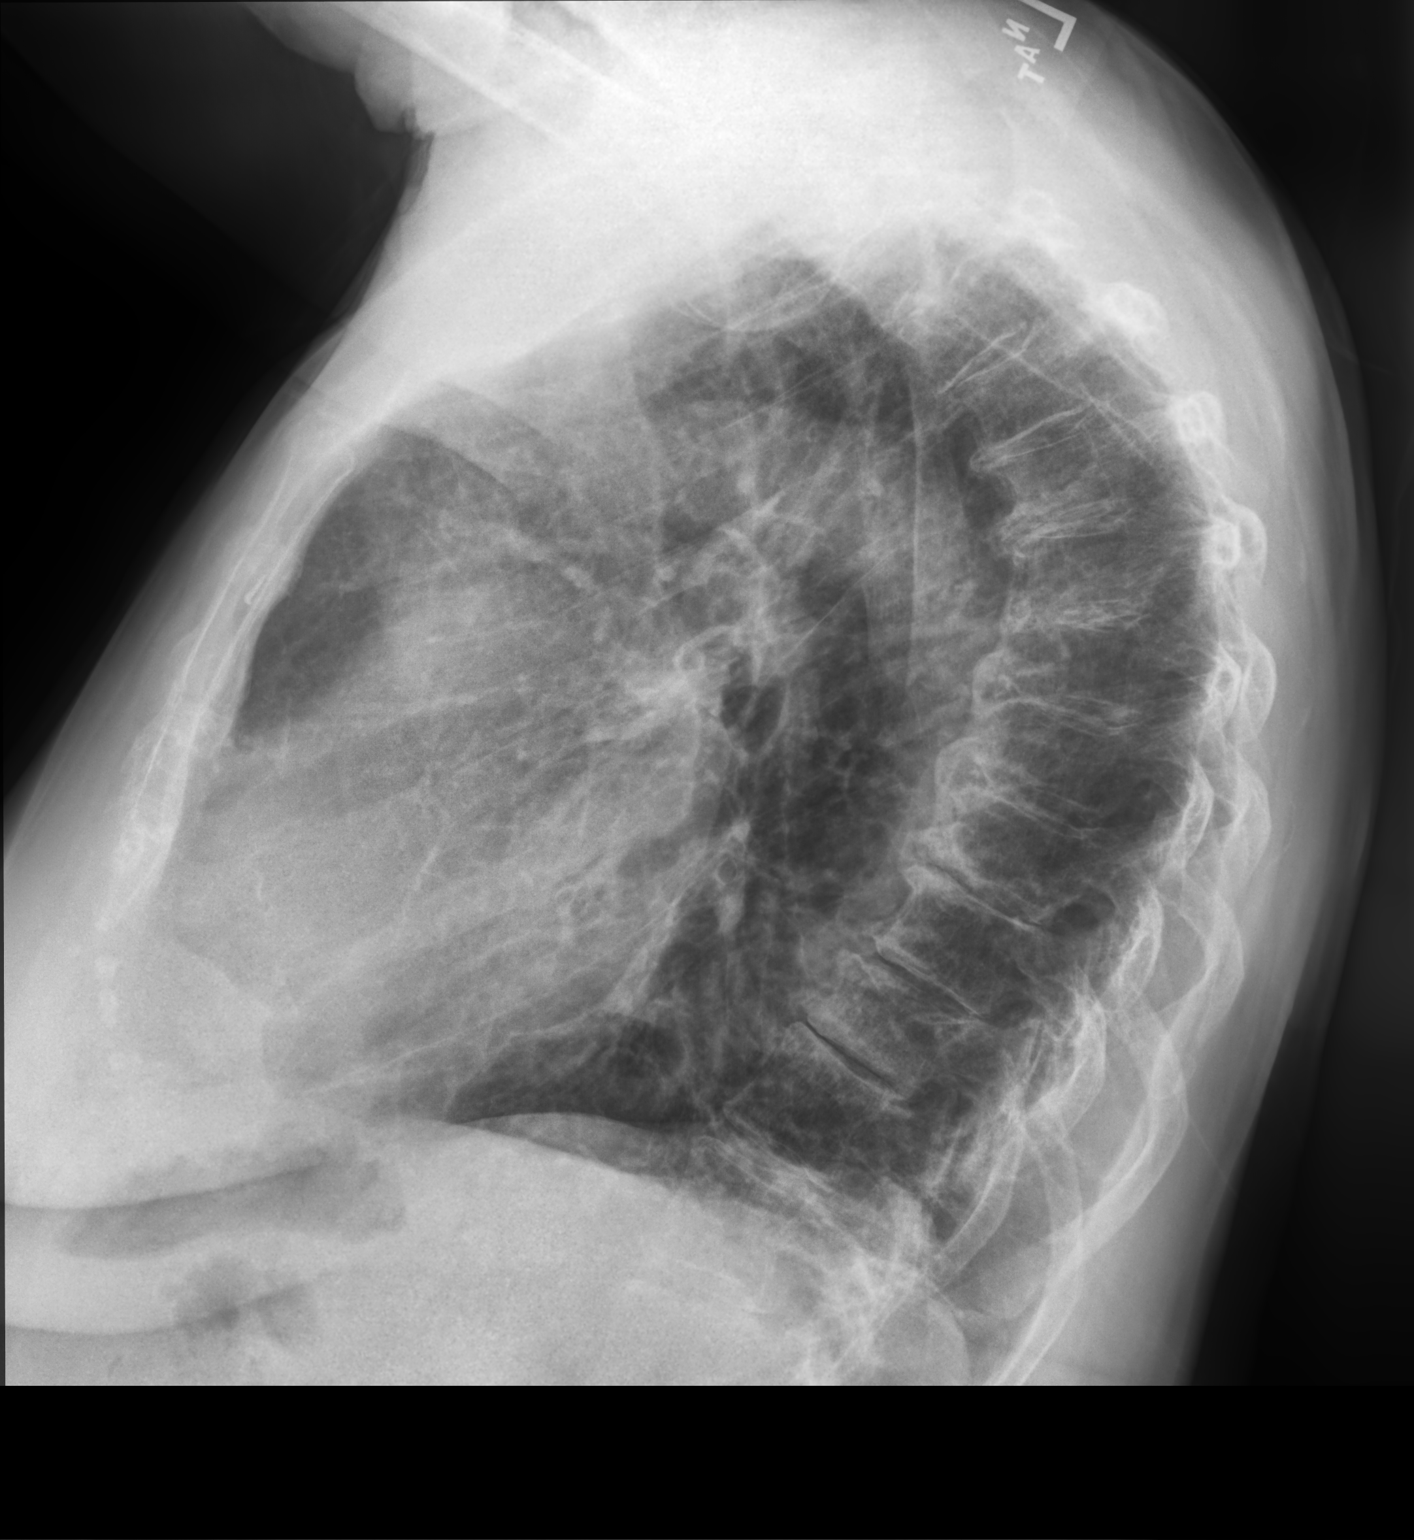

[2 of 2 positions shown; findings below may reference images not displayed]

FINDINGS: The cardiomediastinal silhouette is unchanged in
contour.Retrocardiac opacity consistent with large hiatal hernia. No
pleural effusion. No pneumothorax. Diffuse reticular opacities with
flattening of the diaphragm consistent with underlying emphysema.
Biapical scarring, similar comparison to prior. No new focal
consolidation. Visualized abdomen is unremarkable. Multilevel
degenerative changes of the thoracic spine.
IMPRESSION: 1.  No acute cardiopulmonary abnormality.
2. Large hiatal hernia.
3. Given underlying emphysema, recommend evaluation for eligibility
for lung cancer screening CT.

## 2023-05-05 ENCOUNTER — Other Ambulatory Visit: Payer: Self-pay | Admitting: Family Medicine

## 2023-06-01 ENCOUNTER — Other Ambulatory Visit: Payer: Self-pay | Admitting: Family Medicine

## 2023-06-01 DIAGNOSIS — R252 Cramp and spasm: Secondary | ICD-10-CM

## 2023-07-02 DIAGNOSIS — H1045 Other chronic allergic conjunctivitis: Secondary | ICD-10-CM | POA: Diagnosis not present

## 2023-07-02 DIAGNOSIS — H401122 Primary open-angle glaucoma, left eye, moderate stage: Secondary | ICD-10-CM | POA: Diagnosis not present

## 2023-07-02 DIAGNOSIS — H40011 Open angle with borderline findings, low risk, right eye: Secondary | ICD-10-CM | POA: Diagnosis not present

## 2023-07-02 DIAGNOSIS — H43812 Vitreous degeneration, left eye: Secondary | ICD-10-CM | POA: Diagnosis not present

## 2023-07-02 DIAGNOSIS — H18513 Endothelial corneal dystrophy, bilateral: Secondary | ICD-10-CM | POA: Diagnosis not present

## 2023-07-02 DIAGNOSIS — H04123 Dry eye syndrome of bilateral lacrimal glands: Secondary | ICD-10-CM | POA: Diagnosis not present

## 2023-07-02 DIAGNOSIS — H353131 Nonexudative age-related macular degeneration, bilateral, early dry stage: Secondary | ICD-10-CM | POA: Diagnosis not present

## 2023-07-06 ENCOUNTER — Ambulatory Visit
Admission: EM | Admit: 2023-07-06 | Discharge: 2023-07-06 | Disposition: A | Discharge status: Home / Self Care | Attending: Physician Assistant | Admitting: Physician Assistant

## 2023-07-06 ENCOUNTER — Other Ambulatory Visit: Payer: Self-pay

## 2023-07-06 ENCOUNTER — Encounter: Payer: Self-pay | Admitting: *Deleted

## 2023-07-06 DIAGNOSIS — J441 Chronic obstructive pulmonary disease with (acute) exacerbation: Secondary | ICD-10-CM

## 2023-07-06 LAB — POC COVID19/FLU A&B COMBO
Covid Antigen, POC: NEGATIVE
Influenza A Antigen, POC: NEGATIVE
Influenza B Antigen, POC: NEGATIVE

## 2023-07-06 MED ORDER — PREDNISONE 20 MG PO TABS: 40.0000 mg | ORAL_TABLET | Freq: Every day | ORAL | 0 refills | Status: AC

## 2023-07-06 MED ORDER — AMOXICILLIN-POT CLAVULANATE 875-125 MG PO TABS: 1.0000 | ORAL_TABLET | Freq: Two times a day (BID) | ORAL | 0 refills | Status: DC

## 2023-07-06 NOTE — ED Provider Notes (Signed)
 [name]    CSN: [id number] Arrival date & time: 07/06/23  1126      History   Chief Complaint Chief Complaint  Patient presents with   Cough    HPI Beth [name] is a 89 y.o. female.   Here today for evaluation of cough, congestion that started about a week ago.  She reports that she does have COPD and has had more wheezing and mucus production.  She has not had fever.  She denies any vomiting or diarrhea.  She does report some overall generalized weakness.  The history is provided by the patient.  Cough Associated symptoms: wheezing   Associated symptoms: no chills, no ear pain, no eye discharge, no fever, no shortness of breath and no sore throat     Past Medical History:  Diagnosis Date   Allergy    Anxiety    situational   Arthritis    Asthma    Cataract    COPD (chronic obstructive pulmonary disease) (HCC)    PER PT   Diverticulitis    Diverticulitis    Emphysema of lung (HCC)    Esophageal dysmotility    Fibromyalgia    Hiatal hernia    Hyperlipidemia    Hypertension    Insomnia    Osteopenia    UTI (lower urinary tract infection)     Patient Active Problem List   Diagnosis Date Noted   Major depressive disorder, single episode, mild (HCC) 02/24/2023   Mixed hyperlipidemia 10/30/2020   Arthritis 10/26/2020   Asthma 10/26/2020   Cataract 10/26/2020   Diverticulitis 10/26/2020   Esophageal dysmotility 10/26/2020   Fibromyalgia 10/26/2020   Osteopenia 10/26/2020   Poor sleep pattern 10/11/2020   Ocular proptosis 01/31/2020   Reactive airways dysfunction syndrome (HCC) 03/30/2018   Memory impairment 02/04/2018   Sciatica, left side 02/04/2018   Recurrent UTI (urinary tract infection) 07/14/2017   Cardiomegaly 11/06/2015   History of recurrent UTIs 05/29/2015   COPD, mild (HCC) 10/23/2014   Laryngopharyngeal reflux (LPR) 10/23/2014   Headache 01/17/2014   Generalized anxiety disorder 01/16/2014   Insomnia 01/16/2014   Lactose  intolerance 09/06/2013   Ataxia 03/16/2011   HYPERLIPIDEMIA 11/13/2010   MURMUR 05/11/2009   URINARY URGENCY 05/11/2009   Diaphragmatic hernia 03/14/2009   Macular degeneration (senile) of retina 12/06/2008   HYPERTENSION, BENIGN 12/06/2008   DIVERTICULITIS, COLON 12/06/2008   FIBROMYALGIA 12/06/2008   Disorder of bone and cartilage 12/06/2008    Past Surgical History:  Procedure Laterality Date   ABDOMINAL HYSTERECTOMY     BLADDER SURGERY     tac   COLONOSCOPY     2008    OB History   No obstetric history on file.      Home Medications    Prior to Admission medications   Medication Sig Start Date End Date Taking? Authorizing Provider  amoxicillin-clavulanate (AUGMENTIN) 875-125 MG tablet Take 1 tablet by mouth every 12 (twelve) hours. 07/06/23  Yes Tomi Bamberger, PA-C  predniSONE (DELTASONE) 20 MG tablet Take 2 tablets (40 mg total) by mouth daily with breakfast for 5 days. 07/06/23 07/11/23 Yes Tomi Bamberger, PA-C  albuterol (VENTOLIN HFA) 108 (90 Base) MCG/ACT inhaler Inhale 2 puffs into the lungs every 6 (six) hours as needed for wheezing or shortness of breath. 05/06/22   Agapito Games, MD  calcium carbonate 1250 MG capsule Take 1,250 mg by mouth daily.    [provider]  cetirizine (ZYRTEC) 10 MG tablet Take 1  tablet (10 mg total) by mouth daily. 07/15/22   Charlton Amor, DO  Cholecalciferol (VITAMIN D3) 1000 UNITS tablet Take 1,000 Units by mouth daily.    [provider]  diltiazem (CARDIZEM SR) 60 MG 12 hr capsule Take 1 capsule by mouth twice daily 06/02/23   Agapito Games, MD  fluticasone (FLOVENT HFA) 110 MCG/ACT inhaler Inhale 2 puffs into the lungs in the morning and at bedtime. 01/01/22   Agapito Games, MD  ibuprofen (ADVIL,MOTRIN) 200 MG tablet Take 200 mg by mouth every 6 (six) hours as needed.    [provider]  Lemborexant (DAYVIGO) 5 MG TABS Take 1 tablet (5 mg total) by mouth at bedtime. 02/24/23    Agapito Games, MD  omeprazole (PRILOSEC) 40 MG capsule Take 1 capsule (40 mg total) by mouth daily. 02/24/23   Agapito Games, MD  sertraline (ZOLOFT) 50 MG tablet Take 1 tablet by mouth once daily 02/02/23   Agapito Games, MD  triamcinolone cream (KENALOG) 0.1 % APPLY  CREAM EXTERNALLY TWICE DAILY 05/06/23   Agapito Games, MD  vitamin B-12 (CYANOCOBALAMIN) 100 MCG tablet Take 100 mcg by mouth daily.    [provider]    Family History Family History  Problem Relation Age of Onset   Cancer Mother        breast   Hyperlipidemia Mother    Hypertension Mother    Dementia Mother    Heart disease Father        MI   Depression Father    Hyperlipidemia Father    Colon cancer Neg Hx    Esophageal cancer Neg Hx    Stomach cancer Neg Hx    Rectal cancer Neg Hx     Social History Social History   Tobacco Use   Smoking status: Former   Smokeless tobacco: Never   Tobacco comments:    smoker as a teenager  Advertising account planner   Vaping status: Never Used  Substance Use Topics   Alcohol use: No    Alcohol/week: 0.0 standard drinks of alcohol    Comment: occasionally   Drug use: No     Allergies   Doxycycline, Keflex [cephalexin], Lunesta [eszopiclone], Pravachol [pravastatin], Trazodone and nefazodone, and Zocor [simvastatin]   Review of Systems Review of Systems  Constitutional:  Negative for chills and fever.  HENT:  Positive for congestion. Negative for ear pain and sore throat.   Eyes:  Negative for discharge and redness.  Respiratory:  Positive for cough and wheezing. Negative for shortness of breath.   Gastrointestinal:  Negative for abdominal pain, diarrhea, nausea and vomiting.     Physical Exam Triage Vital Signs ED Triage Vitals  Encounter Vitals Group     BP 07/06/23 1226 (!) 152/80     Systolic BP Percentile --      Diastolic BP Percentile --      Pulse Rate 07/06/23 1226 98     Resp 07/06/23 1226 18     Temp 07/06/23 1226  98.1 F (36.7 C)     Temp Source 07/06/23 1226 Oral     SpO2 07/06/23 1226 95 %     Weight --      Height --      Head Circumference --      Peak Flow --      Pain Score 07/06/23 1223 5     Pain Loc --      Pain Education --  Exclude from Growth Chart --    No data found.  Updated Vital Signs BP (!) 152/80 (BP Location: Left Arm)   Pulse 98   Temp 98.1 F (36.7 C) (Oral)   Resp 18   SpO2 95%   Visual Acuity Right Eye Distance:   Left Eye Distance:   Bilateral Distance:    Right Eye Near:   Left Eye Near:    Bilateral Near:     Physical Exam Vitals and nursing note reviewed.  Constitutional:      General: She is not in acute distress.    Appearance: Normal appearance. She is not ill-appearing.  HENT:     Head: Normocephalic and atraumatic.     Nose: Congestion present.     Mouth/Throat:     Mouth: Mucous membranes are moist.     Pharynx: No oropharyngeal exudate or posterior oropharyngeal erythema.  Eyes:     Conjunctiva/sclera: Conjunctivae normal.  Cardiovascular:     Rate and Rhythm: Normal rate and regular rhythm.     Heart sounds: Normal heart sounds. No murmur heard. Pulmonary:     Effort: Pulmonary effort is normal. No respiratory distress.     Breath sounds: Wheezing and rhonchi present. No rales.     Comments: Mild scattered wheezes and rhonchi throughout Skin:    General: Skin is warm and dry.  Neurological:     Mental Status: She is alert.  Psychiatric:        Mood and Affect: Mood normal.        Thought Content: Thought content normal.      UC Treatments / Results  Labs (all labs ordered are listed, but only abnormal results are displayed) Labs Reviewed  POC COVID19/FLU A&B COMBO - Normal    EKG   Radiology No results found.  Procedures Procedures (including critical care time)  Medications Ordered in UC Medications - No data to display  Initial Impression / Assessment and Plan / UC Course  I have reviewed the triage  vital signs and the nursing notes.  Pertinent labs & imaging results that were available during my care of the patient were reviewed by me and considered in my medical decision making (see chart for details).    Flu and COVID screening negative.  Suspect COPD exacerbation will treat with Augmentin and steroid burst.  Recommended follow-up if no gradual improvement or with any further concerns.  Patient expressed understanding.  Final Clinical Impressions(s) / UC Diagnoses   Final diagnoses:  COPD exacerbation [facility])   Discharge Instructions   None    ED Prescriptions     Medication Sig Dispense Auth. Provider   predniSONE (DELTASONE) 20 MG tablet Take 2 tablets (40 mg total) by mouth daily with breakfast for 5 days. 10 tablet Tomi Bamberger, PA-C   amoxicillin-clavulanate (AUGMENTIN) 875-125 MG tablet Take 1 tablet by mouth every 12 (twelve) hours. 14 tablet Tomi Bamberger, PA-C      PDMP not reviewed this encounter.   Tomi Bamberger, PA-C 07/06/23 1343

## 2023-07-06 NOTE — ED Triage Notes (Signed)
 Cough, body aches, fatigue,and runny nose - symptoms started Thursday- Saturday. My arthritis is flared up"

## 2023-07-16 ENCOUNTER — Ambulatory Visit: Payer: Self-pay

## 2023-07-16 NOTE — Telephone Encounter (Signed)
 Chief Complaint: cough/fatigue Symptoms: fatigue, productive cough Frequency: cough started 07/05/23, fatigue worsening over past few days Pertinent Negatives: Patient denies hemoptysis, fever, headache, sore throat, ear aches, worsening SOB Disposition: [] ED /[] Urgent Care (no appt availability in office) / [x] Appointment(In office/virtual)/ []  [facility]/ [] Home Care/ [] Refused Recommended Disposition /[] [facility]/ []  Follow-up with PCP Additional Notes: Patient went to [facility] on 07/06/23 for cough and was placed on Augmentin, prednisone. She states she is using her inhaler, using humidifier, drinking plenty of fluids and taking ibuprofen. She refused any appointments this afternoon and states she feels like she needs to rest today. She is agreeable to acute visit tomorrow at [facility] due to no available appts with PCP or PCP clinic. Patient verbalizes understanding to call back or call 911 for worsening or concerning symptoms. Advised if she feels like she is severely fatigued and can not get out of bed or safely go to appt to call rescue.  Copied from CRM [id number]. Topic: Clinical - Red Word Triage >> Jul 16, 2023  1:31 PM Dondra Prader E wrote: Red Word that prompted transfer to Nurse Triage: Pt called back reporting that her flu symptoms are worsening Reason for Disposition  [1] Known COPD or other severe lung disease (i.e., bronchiectasis, cystic fibrosis, lung surgery) AND [2] worsening symptoms (i.e., increased sputum purulence or amount, increased breathing difficulty  Answer Assessment - Initial Assessment Questions 1. ONSET: "When did the cough begin?"      07/05/23  2. SEVERITY: "How bad is the cough today?"      She states the cough was persistent at first but has improved. She states is it just occasional now.  3. SPUTUM: "Describe the color of your sputum" (none, dry cough; clear, white, yellow, green)     Yellow, thick "not stringy". She  states it has improved some.  4. HEMOPTYSIS: "Are you coughing up any blood?" If so ask: "How much?" (flecks, streaks, tablespoons, etc.)     Denies.  5. DIFFICULTY BREATHING: "Are you having difficulty breathing?" If Yes, ask: "How bad is it?" (e.g., mild, moderate, severe)    - MILD: No SOB at rest, mild SOB with walking, speaks normally in sentences, can lie down, no retractions, pulse < 100.    - MODERATE: SOB at rest, SOB with minimal exertion and prefers to sit, cannot lie down flat, speaks in phrases, mild retractions, audible wheezing, pulse 100-120.    - SEVERE: Very SOB at rest, speaks in single words, struggling to breathe, sitting hunched forward, retractions, pulse > 120      She states it is not worsened than her COPD baseline and is treating.  6. FEVER: "Do you have a fever?" If Yes, ask: "What is your temperature, how was it measured, and when did it start?"     Denies.  7. CARDIAC HISTORY: "Do you have any history of heart disease?" (e.g., heart attack, congestive heart failure)      Unsure.  8. LUNG HISTORY: "Do you have any history of lung disease?"  (e.g., pulmonary embolus, asthma, emphysema)     COPD.  9. PE RISK FACTORS: "Do you have a history of blood clots?" (or: recent major surgery, recent prolonged travel, bedridden)     Denies.  10. OTHER SYMPTOMS: "Do you have any other symptoms?" (e.g., runny nose, wheezing, chest pain)       Fatigue.  11. PREGNANCY: "Is there any chance you are pregnant?" "When was your last menstrual  period?"       N/A.  12. TRAVEL: "Have you traveled out of the country in the last month?" (e.g., travel history, exposures)       Denies.  Protocols used: Cough - Acute Productive-A-AH

## 2023-07-17 ENCOUNTER — Ambulatory Visit: Admitting: Family Medicine

## 2023-07-27 ENCOUNTER — Other Ambulatory Visit: Payer: Self-pay | Admitting: Family Medicine

## 2023-08-25 ENCOUNTER — Ambulatory Visit (INDEPENDENT_AMBULATORY_CARE_PROVIDER_SITE_OTHER): Payer: Medicare HMO | Admitting: Family Medicine

## 2023-08-25 ENCOUNTER — Encounter: Payer: Self-pay | Admitting: Family Medicine

## 2023-08-25 VITALS — BP 135/76 | HR 89 | Ht 67.0 in | Wt 152.0 lb

## 2023-08-25 DIAGNOSIS — I10 Essential (primary) hypertension: Secondary | ICD-10-CM

## 2023-08-25 DIAGNOSIS — F411 Generalized anxiety disorder: Secondary | ICD-10-CM

## 2023-08-25 DIAGNOSIS — F320 Major depressive disorder, single episode, mild: Secondary | ICD-10-CM | POA: Diagnosis not present

## 2023-08-25 DIAGNOSIS — R8290 Unspecified abnormal findings in urine: Secondary | ICD-10-CM

## 2023-08-25 DIAGNOSIS — G4700 Insomnia, unspecified: Secondary | ICD-10-CM

## 2023-08-25 DIAGNOSIS — J449 Chronic obstructive pulmonary disease, unspecified: Secondary | ICD-10-CM

## 2023-08-25 MED ORDER — SERTRALINE HCL 25 MG PO TABS: ORAL_TABLET | ORAL | 0 refills | Status: AC

## 2023-08-25 MED ORDER — UMECLIDINIUM-VILANTEROL 62.5-25 MCG/ACT IN AEPB: 1.0000 | INHALATION_SPRAY | Freq: Every day | RESPIRATORY_TRACT | 5 refills | Status: AC

## 2023-08-25 NOTE — Patient Instructions (Addendum)
 Please avoid products with diphenhydramine.  This is the generic of Benadryl and can increase risk for dementia, falls, constipation and bladder problems.  Since your mood has actually been pretty good and fairly stable for quite some time I am going to try taking you off of the sertraline  and see if you do well without it.  Please follow the instructions on the new bottle to taper off.  Also the Flovent  looks like it is no longer covered so we are going to put you on Anoro.  You can do 1 puff daily to help keep your lungs and chest nice and open.  Let us  know if you need a refill on your albuterol .  Your albuterol  is the medicine that you use only if you are feeling symptomatic.  So going to schedule you back for a breathing test in June.  Just hold any inhalers the day of and the day before you come in for the breathing test.

## 2023-08-25 NOTE — Assessment & Plan Note (Signed)
 BP at goal

## 2023-08-25 NOTE — Assessment & Plan Note (Signed)
 She has not been taking a daily inhaler for quite some time.  Will try to get her scheduled for spirometry for up-to-date testing the last time she had one was in 2016.  And we will also go ahead and start her on Anoro.

## 2023-08-25 NOTE — Progress Notes (Unsigned)
 Established Patient Office Visit  Subjective  Patient ID: Beth [name], female    DOB: May 31, 1932  Age: 89 y.o. MRN: [id number]  Chief Complaint  Patient presents with   Hypertension    HPI  Hypertension- Pt denies chest pain, SOB, dizziness, or heart palpitations.  Taking meds as directed w/o problems.  Denies medication side effects.    She was seen recently at the [facility] urgent care at [facility] for a COPD exacerbation in March.  She was treated with Augmentin  and prednisone .  Negative for COVID at that time.   Also recently followed up with Dr. Candi Chafe for her vitreous degeneration and open-angle glaucoma.  Also noticed a little abnormal urine odor and would like to have a urine checked today.  He is here today with her sister Beth [name].    ROS    Objective:     BP 135/76   Pulse 89   Ht 5\' 7"  (1.702 m)   Wt 152 lb (68.9 kg)   SpO2 94%   BMI 23.81 kg/m    Physical Exam Vitals and nursing note reviewed.  Constitutional:      Appearance: Normal appearance.  HENT:     Head: Normocephalic and atraumatic.  Eyes:     Conjunctiva/sclera: Conjunctivae normal.  Cardiovascular:     Rate and Rhythm: Normal rate and regular rhythm.  Pulmonary:     Effort: Pulmonary effort is normal.     Breath sounds: Normal breath sounds.  Skin:    General: Skin is warm and dry.  Neurological:     Mental Status: She is alert.  Psychiatric:        Mood and Affect: Mood normal.      No results found for any visits on 08/25/23.    The ASCVD Risk score (Arnett DK, et al., 2019) failed to calculate for the following reasons:   The 2019 ASCVD risk score is only valid for ages 45 to 1    Assessment & Plan:   Problem List Items Addressed This Visit       Cardiovascular and Mediastinum   HYPERTENSION, BENIGN - Primary   BP at goal.        Relevant Medications   sertraline  (ZOLOFT ) 25 MG tablet   umeclidinium-vilanterol (ANORO ELLIPTA) 62.5-25 MCG/ACT AEPB   Other  Relevant Orders   CMP14+EGFR   B12     Respiratory   COPD, mild (HCC)   She has not been taking a daily inhaler for quite some time.  Will try to get her scheduled for spirometry for up-to-date testing the last time she had one was in 2016.  And we will also go ahead and start her on Anoro.  She does still have a as needed albuterol .      Relevant Medications   sertraline  (ZOLOFT ) 25 MG tablet   umeclidinium-vilanterol (ANORO ELLIPTA) 62.5-25 MCG/ACT AEPB   Other Relevant Orders   CMP14+EGFR   B12     Other   Major depressive disorder, single episode, mild (HCC)   The note above under anxiety.      Relevant Medications   sertraline  (ZOLOFT ) 25 MG tablet   Insomnia   He unfortunately has struggled over the years and has failed Ambien , doxepin , Lunesta  and now Dayvigo .  She says it does work better if she takes 2 of the Dayvigo  but it has been so expensive with her insurance.  For now we will hold off on any additional treatment.  If she is  able to manage without then we will do so.  I did also encourage her to stay away from products such as diphenhydramine which are often in a lot of the p.m. medications because she also takes over-the-counter medicine sometimes for sleep.  It informed her that they can cause dementia and increased risk for constipation falls and bladder issues.      Generalized anxiety disorder   Orts that overall she feels well.  She is not sure how much the sertraline  is helping or not.  So I think she might have reached her therapeutic goal with it.  Will taper off the sertraline .  I think if we can eliminate any medications that are really not necessary that would be wonderful.  So we will try to do a taper and see how she feels without it.      Relevant Medications   sertraline  (ZOLOFT ) 25 MG tablet   Other Visit Diagnoses       Abnormal urine odor          He attempted to try to give a urine today and was unable to do so so we sent her home with a cup  to provide a sample and then return it.  At her convenience.  Return in about 2 months (around 10/25/2023) for spirometry .    Duaine German, MD

## 2023-08-26 LAB — CMP14+EGFR
ALT: 12 IU/L (ref 0–32)
AST: 15 IU/L (ref 0–40)
Albumin: 4.3 g/dL (ref 3.7–4.7)
Alkaline Phosphatase: 79 IU/L (ref 44–121)
BUN/Creatinine Ratio: 26 (ref 12–28)
BUN: 18 mg/dL (ref 8–27)
Bilirubin Total: 0.3 mg/dL (ref 0.0–1.2)
CO2: 27 mmol/L (ref 20–29)
Calcium: 10.2 mg/dL (ref 8.7–10.3)
Chloride: 101 mmol/L (ref 96–106)
Creatinine, Ser: 0.68 mg/dL (ref 0.57–1.00)
Globulin, Total: 2.5 g/dL (ref 1.5–4.5)
Glucose: 107 mg/dL — ABNORMAL HIGH (ref 70–99)
Potassium: 3.9 mmol/L (ref 3.5–5.2)
Sodium: 143 mmol/L (ref 134–144)
Total Protein: 6.8 g/dL (ref 6.0–8.5)
eGFR: 83 mL/min/{1.73_m2} (ref >59)

## 2023-08-26 LAB — VITAMIN B12: Vitamin B-12: 964 pg/mL (ref 232–1245)

## 2023-08-26 NOTE — Assessment & Plan Note (Signed)
 Orts that overall she feels well.  She is not sure how much the sertraline  is helping or not.  So I think she might have reached her therapeutic goal with it.  Will taper off the sertraline .  I think if we can eliminate any medications that are really not necessary that would be wonderful.  So we will try to do a taper and see how she feels without it.

## 2023-08-26 NOTE — Progress Notes (Signed)
 Your lab work is within acceptable range and there are no concerning findings.   ?

## 2023-08-26 NOTE — Assessment & Plan Note (Signed)
 He unfortunately has struggled over the years and has failed Ambien , doxepin , Lunesta  and now Dayvigo .  She says it does work better if she takes 2 of the Dayvigo  but it has been so expensive with her insurance.  For now we will hold off on any additional treatment.  If she is able to manage without then we will do so.  I did also encourage her to stay away from products such as diphenhydramine which are often in a lot of the p.m. medications because she also takes over-the-counter medicine sometimes for sleep.  It informed her that they can cause dementia and increased risk for constipation falls and bladder issues.

## 2023-08-26 NOTE — Assessment & Plan Note (Signed)
 The note above under anxiety.

## 2023-10-27 ENCOUNTER — Ambulatory Visit (INDEPENDENT_AMBULATORY_CARE_PROVIDER_SITE_OTHER): Admitting: Family Medicine

## 2023-10-27 VITALS — BP 136/72 | HR 87 | Resp 20 | Ht 67.0 in | Wt 151.0 lb

## 2023-10-27 DIAGNOSIS — R8290 Unspecified abnormal findings in urine: Secondary | ICD-10-CM

## 2023-10-27 DIAGNOSIS — J449 Chronic obstructive pulmonary disease, unspecified: Secondary | ICD-10-CM | POA: Diagnosis not present

## 2023-10-27 LAB — POCT URINALYSIS DIP (CLINITEK)
Bilirubin, UA: NEGATIVE
Blood, UA: NEGATIVE
Glucose, UA: NEGATIVE mg/dL
Ketones, POC UA: NEGATIVE mg/dL
Nitrite, UA: NEGATIVE
POC PROTEIN,UA: NEGATIVE
Spec Grav, UA: 1.025 (ref 1.010–1.025)
Urobilinogen, UA: 0.2 U/dL
pH, UA: 6 (ref 5.0–8.0)

## 2023-10-27 MED ORDER — ALBUTEROL SULFATE (2.5 MG/3ML) 0.083% IN NEBU
2.5000 mg | INHALATION_SOLUTION | Freq: Once | RESPIRATORY_TRACT | Status: AC
  Administered 2023-10-27: 2.5 mg via RESPIRATORY_TRACT

## 2023-10-27 NOTE — Assessment & Plan Note (Addendum)
 We went over her results together.  Spirometry October 27, 2023: FVC 86%, FEV1 84%, ratio of 71% no significant improvement with albuterol .  No significant change compared to prior spirometry in fact FEV1 was a little better this time.  Will continue to monitor.  She has an albuterol  inhaler to use as needed.  She also has an aura but says she is not sure if she likes the powder or not.  And she is just been using it occasionally.  We discussed that the benefit is from using it daily.  If she has any problems with affordability then please let me know we could always try to see if Spiriva is covered.

## 2023-10-27 NOTE — Progress Notes (Signed)
 Established Patient Office Visit  Subjective  Patient ID: Beth [name], female    DOB: 26-Mar-1933  Age: 89 y.o. MRN: [id number]  Chief Complaint  Patient presents with   Spirometry     HPI  She is here today for spirometry.  She was recently seen at urgent care in March for a COPD exacerbation.  When I looked back in her chart she had not had a spirometry since 2016 so I wanted her to come back in so that we can get that updated.  She says most of the time she rarely has symptoms.  But when she was sick in March she had a cough congestion wheezing and increased mucus production.  She does feel like she is definitely back to her baseline.  He also complains of some intermittent back pain that she wonders if it could be related to her bladder.  She said she will occasionally pick up Azo over-the-counter when it hurts and will take that for a few days.  No fevers or chills she had mentioned it when she came in at the last visit but was unable to give a urine specimen so it sent her home with a cup which she never returned.  At    ROS    Objective:     BP 136/72 (BP Location: Left Arm, Cuff Size: Normal)   Pulse 87   Resp 20   Ht 5' 7 (1.702 m)   Wt 151 lb 0.6 oz (68.5 kg)   SpO2 95%   BMI 23.66 kg/m    Physical Exam Vitals and nursing note reviewed.  Constitutional:      Appearance: Normal appearance.  HENT:     Head: Normocephalic and atraumatic.   Eyes:     Conjunctiva/sclera: Conjunctivae normal.   Pulmonary:     Effort: Pulmonary effort is normal.   Skin:    General: Skin is warm and dry.   Neurological:     Mental Status: She is alert.   Psychiatric:        Mood and Affect: Mood normal.      Results for orders placed or performed in visit on 10/27/23  POCT URINALYSIS DIP (CLINITEK)  Result Value Ref Range   Color, UA yellow yellow   Clarity, UA cloudy (A) clear   Glucose, UA negative negative mg/dL   Bilirubin, UA negative negative   Ketones,  POC UA negative negative mg/dL   Spec Grav, UA 8.974 8.989 - 1.025   Blood, UA negative negative   pH, UA 6.0 5.0 - 8.0   POC PROTEIN,UA negative negative, trace   Urobilinogen, UA 0.2 0.2 or 1.0 E.U./dL   Nitrite, UA Negative Negative   Leukocytes, UA Moderate (2+) (A) Negative      The ASCVD Risk score (Arnett DK, et al., 2019) failed to calculate for the following reasons:   The 2019 ASCVD risk score is only valid for ages 15 to 6    Assessment & Plan:   Problem List Items Addressed This Visit       Respiratory   COPD, mild (HCC) - Primary   We went over her results together.  Spirometry October 27, 2023: FVC 86%, FEV1 84%, ratio of 71% no significant improvement with albuterol .  No significant change compared to prior spirometry in fact FEV1 was a little better this time.  Will continue to monitor.  She has an albuterol  inhaler to use as needed.  She also has an aura but says  she is not sure if she likes the powder or not.  And she is just been using it occasionally.  We discussed that the benefit is from using it daily.  If she has any problems with affordability then please let me know we could always try to see if Spiriva is covered.      Relevant Orders   PR BRNCDILAT RSPSE SPMTRY PRE&POST-BRNCDILAT ADMN   Other Visit Diagnoses       Abnormal urine odor       Relevant Orders   POCT URINALYSIS DIP (CLINITEK) (Completed)   Urine Culture      Urinalysis today just shows leukocytes.  Will send for culture.  But I suspect it probably is just back pain that is unrelated.  No follow-ups on file.    Dorothyann Byars, MD

## 2023-10-30 LAB — URINE CULTURE

## 2023-10-31 ENCOUNTER — Ambulatory Visit: Payer: Self-pay | Admitting: Family Medicine

## 2023-10-31 NOTE — Progress Notes (Signed)
 Call pt: Urine culture is negative. No UTI

## 2023-11-26 ENCOUNTER — Other Ambulatory Visit: Payer: Self-pay | Admitting: Family Medicine

## 2023-11-26 DIAGNOSIS — F411 Generalized anxiety disorder: Secondary | ICD-10-CM

## 2023-12-07 ENCOUNTER — Other Ambulatory Visit: Payer: Self-pay | Admitting: Family Medicine

## 2023-12-07 DIAGNOSIS — F411 Generalized anxiety disorder: Secondary | ICD-10-CM

## 2023-12-15 ENCOUNTER — Other Ambulatory Visit: Payer: Self-pay | Admitting: Family Medicine

## 2023-12-15 DIAGNOSIS — F411 Generalized anxiety disorder: Secondary | ICD-10-CM

## 2023-12-31 ENCOUNTER — Encounter: Payer: Self-pay | Admitting: Sports Medicine

## 2024-02-10 DIAGNOSIS — Z008 Encounter for other general examination: Secondary | ICD-10-CM | POA: Diagnosis not present

## 2024-04-05 ENCOUNTER — Encounter: Payer: Self-pay | Admitting: Family Medicine

## 2024-04-05 ENCOUNTER — Ambulatory Visit: Payer: Self-pay | Admitting: Family Medicine

## 2024-04-05 NOTE — Progress Notes (Signed)
 Hi Beth [name] we received a copy of heart monitor that I think was ordered through your insurance some not really sure but we did not see any concerning findings.  I know we have not seen you since the summer so just wanted to see how you are doing also let us  know if you have gotten your flu shot this fall I hope so but if not we are happy to do that here or you can have it done at your pharmacy please just let us  know we also need to get you scheduled for a telephone call with Jon for your Medicare wellness yearly intake.  If you are okay with that then lets try to get you scheduled.
